# Patient Record
Sex: Male | Born: 1945
Health system: Southern US, Community
[De-identification: ages and names within clinical notes are randomized; demographics above are authoritative.]

## PROBLEM LIST (undated history)

## (undated) DIAGNOSIS — R413 Other amnesia: Principal | ICD-10-CM

## (undated) DIAGNOSIS — I10 Essential (primary) hypertension: Secondary | ICD-10-CM

## (undated) DIAGNOSIS — F32A Depression, unspecified: Secondary | ICD-10-CM

## (undated) DIAGNOSIS — R269 Unspecified abnormalities of gait and mobility: Secondary | ICD-10-CM

## (undated) DIAGNOSIS — E785 Hyperlipidemia, unspecified: Secondary | ICD-10-CM

## (undated) DIAGNOSIS — S37009A Unspecified injury of unspecified kidney, initial encounter: Secondary | ICD-10-CM

## (undated) DIAGNOSIS — N289 Disorder of kidney and ureter, unspecified: Secondary | ICD-10-CM

## (undated) DIAGNOSIS — F419 Anxiety disorder, unspecified: Secondary | ICD-10-CM

## (undated) DIAGNOSIS — K219 Gastro-esophageal reflux disease without esophagitis: Secondary | ICD-10-CM

## (undated) DIAGNOSIS — F1010 Alcohol abuse, uncomplicated: Secondary | ICD-10-CM

## (undated) HISTORY — DX: Disorder of kidney and ureter, unspecified: N28.9

## (undated) HISTORY — DX: Unspecified injury of unspecified kidney, initial encounter: S37.009A

## (undated) HISTORY — PX: COLONOSCOPY: SHX174

## (undated) HISTORY — DX: Alcohol abuse, uncomplicated: F10.10

## (undated) HISTORY — DX: Unspecified abnormalities of gait and mobility: R26.9

## (undated) HISTORY — PX: X-STOP IMPLANTATION: SHX2677

## (undated) HISTORY — PX: BACK SURGERY: SHX140

## (undated) HISTORY — PX: EYE SURGERY: SHX253

## (undated) HISTORY — PX: COLONOSCOPY W/ POLYPECTOMY: SHX1380

## (undated) HISTORY — DX: Other amnesia: R41.3

---

## 2000-02-26 ENCOUNTER — Encounter: Payer: Self-pay | Admitting: *Deleted

## 2000-02-26 ENCOUNTER — Encounter: Admission: RE | Admit: 2000-02-26 | Discharge: 2000-02-26 | Payer: Self-pay | Admitting: *Deleted

## 2006-07-31 ENCOUNTER — Encounter: Admission: RE | Admit: 2006-07-31 | Discharge: 2006-07-31 | Payer: Self-pay | Admitting: Internal Medicine

## 2009-12-15 HISTORY — PX: KNEE SURGERY: SHX244

## 2010-03-20 ENCOUNTER — Ambulatory Visit (HOSPITAL_COMMUNITY): Admission: RE | Admit: 2010-03-20 | Discharge: 2010-03-22 | Payer: Self-pay | Admitting: Orthopedic Surgery

## 2011-02-17 ENCOUNTER — Ambulatory Visit: Payer: BLUE CROSS/BLUE SHIELD | Attending: Dermatology | Admitting: Dermatology

## 2011-02-17 DIAGNOSIS — L254 Unspecified contact dermatitis due to food in contact with skin: Secondary | ICD-10-CM | POA: Insufficient documentation

## 2011-03-03 ENCOUNTER — Ambulatory Visit: Payer: BLUE CROSS/BLUE SHIELD | Attending: Dermatology | Admitting: Dermatology

## 2011-03-03 DIAGNOSIS — L254 Unspecified contact dermatitis due to food in contact with skin: Secondary | ICD-10-CM | POA: Insufficient documentation

## 2011-03-05 LAB — CBC
HCT: 35.3 % — ABNORMAL LOW (ref 39.0–52.0)
Hemoglobin: 12.1 g/dL — ABNORMAL LOW (ref 13.0–17.0)
MCHC: 34.4 g/dL (ref 30.0–36.0)
MCV: 92.8 fL (ref 78.0–100.0)
Platelets: 286 10*3/uL (ref 150–400)
RBC: 3.81 MIL/uL — ABNORMAL LOW (ref 4.22–5.81)
RDW: 13.4 % (ref 11.5–15.5)
WBC: 7 10*3/uL (ref 4.0–10.5)

## 2011-03-05 LAB — COMPREHENSIVE METABOLIC PANEL
ALT: 27 U/L (ref 0–53)
AST: 36 U/L (ref 0–37)
Albumin: 4 g/dL (ref 3.5–5.2)
Alkaline Phosphatase: 37 U/L — ABNORMAL LOW (ref 39–117)
BUN: 29 mg/dL — ABNORMAL HIGH (ref 6–23)
CO2: 27 mEq/L (ref 19–32)
Calcium: 9.8 mg/dL (ref 8.4–10.5)
Chloride: 103 mEq/L (ref 96–112)
Creatinine, Ser: 1.43 mg/dL (ref 0.4–1.5)
GFR calc Af Amer: >60 mL/min (ref >60)
GFR calc non Af Amer: 50 mL/min — ABNORMAL LOW (ref >60)
Glucose, Bld: 96 mg/dL (ref 70–99)
Potassium: 3.8 mEq/L (ref 3.5–5.1)
Sodium: 139 mEq/L (ref 135–145)
Total Bilirubin: 0.8 mg/dL (ref 0.3–1.2)
Total Protein: 7.6 g/dL (ref 6.0–8.3)

## 2011-03-05 LAB — PROTIME-INR
INR: 1.27 (ref 0.00–1.49)
INR: 1.28 (ref 0.00–1.49)
Prothrombin Time: 15.9 seconds — ABNORMAL HIGH (ref 11.6–15.2)

## 2011-03-05 LAB — DIFFERENTIAL
Basophils Absolute: 0 10*3/uL (ref 0.0–0.1)
Basophils Relative: 1 % (ref 0–1)
Eosinophils Absolute: 0.5 10*3/uL (ref 0.0–0.7)
Eosinophils Relative: 8 % — ABNORMAL HIGH (ref 0–5)
Lymphocytes Relative: 27 % (ref 12–46)
Lymphs Abs: 1.9 10*3/uL (ref 0.7–4.0)
Monocytes Absolute: 0.5 10*3/uL (ref 0.1–1.0)
Monocytes Relative: 7 % (ref 3–12)
Neutro Abs: 4.1 10*3/uL (ref 1.7–7.7)
Neutrophils Relative %: 58 % (ref 43–77)

## 2011-03-05 LAB — URINALYSIS, ROUTINE W REFLEX MICROSCOPIC
Bilirubin Urine: NEGATIVE
Hgb urine dipstick: NEGATIVE
Specific Gravity, Urine: 1.013 (ref 1.005–1.030)
pH: 6 (ref 5.0–8.0)

## 2012-07-21 DIAGNOSIS — E785 Hyperlipidemia, unspecified: Secondary | ICD-10-CM | POA: Diagnosis not present

## 2012-07-21 DIAGNOSIS — I10 Essential (primary) hypertension: Secondary | ICD-10-CM | POA: Diagnosis not present

## 2012-07-28 DIAGNOSIS — N182 Chronic kidney disease, stage 2 (mild): Secondary | ICD-10-CM | POA: Diagnosis not present

## 2012-07-28 DIAGNOSIS — M545 Low back pain, unspecified: Secondary | ICD-10-CM | POA: Diagnosis not present

## 2012-07-28 DIAGNOSIS — E785 Hyperlipidemia, unspecified: Secondary | ICD-10-CM | POA: Diagnosis not present

## 2012-07-28 DIAGNOSIS — R7301 Impaired fasting glucose: Secondary | ICD-10-CM | POA: Diagnosis not present

## 2012-08-27 DIAGNOSIS — N182 Chronic kidney disease, stage 2 (mild): Secondary | ICD-10-CM | POA: Diagnosis not present

## 2012-08-27 DIAGNOSIS — M79609 Pain in unspecified limb: Secondary | ICD-10-CM | POA: Diagnosis not present

## 2012-08-27 DIAGNOSIS — I10 Essential (primary) hypertension: Secondary | ICD-10-CM | POA: Diagnosis not present

## 2012-09-17 DIAGNOSIS — Z23 Encounter for immunization: Secondary | ICD-10-CM | POA: Diagnosis not present

## 2012-09-24 DIAGNOSIS — M545 Low back pain, unspecified: Secondary | ICD-10-CM | POA: Diagnosis not present

## 2012-10-13 DIAGNOSIS — IMO0002 Reserved for concepts with insufficient information to code with codable children: Secondary | ICD-10-CM | POA: Diagnosis not present

## 2012-10-21 DIAGNOSIS — IMO0002 Reserved for concepts with insufficient information to code with codable children: Secondary | ICD-10-CM | POA: Diagnosis not present

## 2012-10-28 DIAGNOSIS — M48061 Spinal stenosis, lumbar region without neurogenic claudication: Secondary | ICD-10-CM | POA: Diagnosis not present

## 2012-10-29 ENCOUNTER — Other Ambulatory Visit: Payer: Self-pay | Admitting: Orthopedic Surgery

## 2012-10-29 DIAGNOSIS — M48061 Spinal stenosis, lumbar region without neurogenic claudication: Secondary | ICD-10-CM

## 2012-11-01 ENCOUNTER — Other Ambulatory Visit: Payer: Self-pay | Admitting: Orthopedic Surgery

## 2012-11-01 ENCOUNTER — Ambulatory Visit
Admission: RE | Admit: 2012-11-01 | Discharge: 2012-11-01 | Disposition: A | Discharge status: Home / Self Care | Payer: Medicare Other | Source: Ambulatory Visit | Attending: Orthopedic Surgery | Admitting: Orthopedic Surgery

## 2012-11-01 ENCOUNTER — Other Ambulatory Visit: Payer: Self-pay

## 2012-11-01 DIAGNOSIS — M48061 Spinal stenosis, lumbar region without neurogenic claudication: Secondary | ICD-10-CM

## 2012-11-15 ENCOUNTER — Other Ambulatory Visit: Payer: Medicare Other

## 2013-02-02 DIAGNOSIS — I10 Essential (primary) hypertension: Secondary | ICD-10-CM | POA: Diagnosis not present

## 2013-02-02 DIAGNOSIS — Z125 Encounter for screening for malignant neoplasm of prostate: Secondary | ICD-10-CM | POA: Diagnosis not present

## 2013-02-02 DIAGNOSIS — R7301 Impaired fasting glucose: Secondary | ICD-10-CM | POA: Diagnosis not present

## 2013-02-02 DIAGNOSIS — E785 Hyperlipidemia, unspecified: Secondary | ICD-10-CM | POA: Diagnosis not present

## 2013-02-09 DIAGNOSIS — I10 Essential (primary) hypertension: Secondary | ICD-10-CM | POA: Diagnosis not present

## 2013-02-09 DIAGNOSIS — Z0000 Encounter for general adult medical examination without abnormal findings: Secondary | ICD-10-CM | POA: Diagnosis not present

## 2013-02-09 DIAGNOSIS — Z125 Encounter for screening for malignant neoplasm of prostate: Secondary | ICD-10-CM | POA: Diagnosis not present

## 2013-02-09 DIAGNOSIS — Z1331 Encounter for screening for depression: Secondary | ICD-10-CM | POA: Diagnosis not present

## 2013-02-11 DIAGNOSIS — Z1212 Encounter for screening for malignant neoplasm of rectum: Secondary | ICD-10-CM | POA: Diagnosis not present

## 2013-08-17 DIAGNOSIS — E785 Hyperlipidemia, unspecified: Secondary | ICD-10-CM | POA: Diagnosis not present

## 2013-08-26 DIAGNOSIS — Z23 Encounter for immunization: Secondary | ICD-10-CM | POA: Diagnosis not present

## 2013-08-26 DIAGNOSIS — R7301 Impaired fasting glucose: Secondary | ICD-10-CM | POA: Diagnosis not present

## 2013-08-26 DIAGNOSIS — I10 Essential (primary) hypertension: Secondary | ICD-10-CM | POA: Diagnosis not present

## 2013-08-26 DIAGNOSIS — Z6828 Body mass index (BMI) 28.0-28.9, adult: Secondary | ICD-10-CM | POA: Diagnosis not present

## 2013-08-26 DIAGNOSIS — N183 Chronic kidney disease, stage 3 unspecified: Secondary | ICD-10-CM | POA: Diagnosis not present

## 2013-08-26 DIAGNOSIS — E785 Hyperlipidemia, unspecified: Secondary | ICD-10-CM | POA: Diagnosis not present

## 2013-09-26 DIAGNOSIS — N183 Chronic kidney disease, stage 3 unspecified: Secondary | ICD-10-CM | POA: Diagnosis not present

## 2013-12-23 DIAGNOSIS — H40219 Acute angle-closure glaucoma, unspecified eye: Secondary | ICD-10-CM | POA: Diagnosis not present

## 2013-12-23 DIAGNOSIS — H2000 Unspecified acute and subacute iridocyclitis: Secondary | ICD-10-CM | POA: Diagnosis not present

## 2013-12-24 DIAGNOSIS — H2000 Unspecified acute and subacute iridocyclitis: Secondary | ICD-10-CM | POA: Diagnosis not present

## 2013-12-26 DIAGNOSIS — H2000 Unspecified acute and subacute iridocyclitis: Secondary | ICD-10-CM | POA: Diagnosis not present

## 2014-01-02 DIAGNOSIS — H2000 Unspecified acute and subacute iridocyclitis: Secondary | ICD-10-CM | POA: Diagnosis not present

## 2014-01-03 DIAGNOSIS — H2000 Unspecified acute and subacute iridocyclitis: Secondary | ICD-10-CM | POA: Diagnosis not present

## 2014-01-04 DIAGNOSIS — H2000 Unspecified acute and subacute iridocyclitis: Secondary | ICD-10-CM | POA: Diagnosis not present

## 2014-01-11 DIAGNOSIS — H2000 Unspecified acute and subacute iridocyclitis: Secondary | ICD-10-CM | POA: Diagnosis not present

## 2014-01-16 DIAGNOSIS — H2000 Unspecified acute and subacute iridocyclitis: Secondary | ICD-10-CM | POA: Diagnosis not present

## 2014-01-23 DIAGNOSIS — H2000 Unspecified acute and subacute iridocyclitis: Secondary | ICD-10-CM | POA: Diagnosis not present

## 2014-02-03 DIAGNOSIS — H4050X0 Glaucoma secondary to other eye disorders, unspecified eye, stage unspecified: Secondary | ICD-10-CM | POA: Diagnosis not present

## 2014-02-03 DIAGNOSIS — H409 Unspecified glaucoma: Secondary | ICD-10-CM | POA: Diagnosis not present

## 2014-02-03 DIAGNOSIS — H40839 Aqueous misdirection, unspecified eye: Secondary | ICD-10-CM | POA: Diagnosis not present

## 2014-02-03 DIAGNOSIS — H209 Unspecified iridocyclitis: Secondary | ICD-10-CM | POA: Diagnosis not present

## 2014-02-08 DIAGNOSIS — H2100 Hyphema, unspecified eye: Secondary | ICD-10-CM | POA: Diagnosis not present

## 2014-02-08 DIAGNOSIS — T8579XA Infection and inflammatory reaction due to other internal prosthetic devices, implants and grafts, initial encounter: Secondary | ICD-10-CM | POA: Diagnosis not present

## 2014-02-08 DIAGNOSIS — H4050X0 Glaucoma secondary to other eye disorders, unspecified eye, stage unspecified: Secondary | ICD-10-CM | POA: Diagnosis not present

## 2014-02-08 DIAGNOSIS — H409 Unspecified glaucoma: Secondary | ICD-10-CM | POA: Diagnosis not present

## 2014-02-10 DIAGNOSIS — H2000 Unspecified acute and subacute iridocyclitis: Secondary | ICD-10-CM | POA: Diagnosis not present

## 2014-02-13 DIAGNOSIS — H4050X0 Glaucoma secondary to other eye disorders, unspecified eye, stage unspecified: Secondary | ICD-10-CM | POA: Diagnosis not present

## 2014-02-20 DIAGNOSIS — Z5189 Encounter for other specified aftercare: Secondary | ICD-10-CM | POA: Diagnosis not present

## 2014-02-25 DIAGNOSIS — H4050X0 Glaucoma secondary to other eye disorders, unspecified eye, stage unspecified: Secondary | ICD-10-CM | POA: Diagnosis not present

## 2014-02-27 DIAGNOSIS — H4050X0 Glaucoma secondary to other eye disorders, unspecified eye, stage unspecified: Secondary | ICD-10-CM | POA: Diagnosis not present

## 2014-03-03 DIAGNOSIS — H4050X0 Glaucoma secondary to other eye disorders, unspecified eye, stage unspecified: Secondary | ICD-10-CM | POA: Diagnosis not present

## 2014-03-10 DIAGNOSIS — H4050X0 Glaucoma secondary to other eye disorders, unspecified eye, stage unspecified: Secondary | ICD-10-CM | POA: Diagnosis not present

## 2014-03-29 DIAGNOSIS — Z125 Encounter for screening for malignant neoplasm of prostate: Secondary | ICD-10-CM | POA: Diagnosis not present

## 2014-03-29 DIAGNOSIS — E785 Hyperlipidemia, unspecified: Secondary | ICD-10-CM | POA: Diagnosis not present

## 2014-03-29 DIAGNOSIS — I10 Essential (primary) hypertension: Secondary | ICD-10-CM | POA: Diagnosis not present

## 2014-03-29 DIAGNOSIS — R7301 Impaired fasting glucose: Secondary | ICD-10-CM | POA: Diagnosis not present

## 2014-04-05 DIAGNOSIS — E785 Hyperlipidemia, unspecified: Secondary | ICD-10-CM | POA: Diagnosis not present

## 2014-04-05 DIAGNOSIS — Z1331 Encounter for screening for depression: Secondary | ICD-10-CM | POA: Diagnosis not present

## 2014-04-05 DIAGNOSIS — Z125 Encounter for screening for malignant neoplasm of prostate: Secondary | ICD-10-CM | POA: Diagnosis not present

## 2014-04-05 DIAGNOSIS — Z0000 Encounter for general adult medical examination without abnormal findings: Secondary | ICD-10-CM | POA: Diagnosis not present

## 2014-04-05 DIAGNOSIS — M545 Low back pain, unspecified: Secondary | ICD-10-CM | POA: Diagnosis not present

## 2014-04-05 DIAGNOSIS — K219 Gastro-esophageal reflux disease without esophagitis: Secondary | ICD-10-CM | POA: Diagnosis not present

## 2014-04-05 DIAGNOSIS — R7301 Impaired fasting glucose: Secondary | ICD-10-CM | POA: Diagnosis not present

## 2014-04-05 DIAGNOSIS — N183 Chronic kidney disease, stage 3 unspecified: Secondary | ICD-10-CM | POA: Diagnosis not present

## 2014-04-05 DIAGNOSIS — H2000 Unspecified acute and subacute iridocyclitis: Secondary | ICD-10-CM | POA: Diagnosis not present

## 2014-04-05 DIAGNOSIS — I10 Essential (primary) hypertension: Secondary | ICD-10-CM | POA: Diagnosis not present

## 2014-04-07 DIAGNOSIS — Z1212 Encounter for screening for malignant neoplasm of rectum: Secondary | ICD-10-CM | POA: Diagnosis not present

## 2014-04-12 DIAGNOSIS — H4050X0 Glaucoma secondary to other eye disorders, unspecified eye, stage unspecified: Secondary | ICD-10-CM | POA: Diagnosis not present

## 2014-06-19 DIAGNOSIS — H01119 Allergic dermatitis of unspecified eye, unspecified eyelid: Secondary | ICD-10-CM | POA: Diagnosis not present

## 2014-07-05 DIAGNOSIS — H4050X0 Glaucoma secondary to other eye disorders, unspecified eye, stage unspecified: Secondary | ICD-10-CM | POA: Diagnosis not present

## 2014-09-25 DIAGNOSIS — Z23 Encounter for immunization: Secondary | ICD-10-CM | POA: Diagnosis not present

## 2014-09-25 DIAGNOSIS — I10 Essential (primary) hypertension: Secondary | ICD-10-CM | POA: Diagnosis not present

## 2014-09-25 DIAGNOSIS — E785 Hyperlipidemia, unspecified: Secondary | ICD-10-CM | POA: Diagnosis not present

## 2014-09-25 DIAGNOSIS — R7301 Impaired fasting glucose: Secondary | ICD-10-CM | POA: Diagnosis not present

## 2014-10-04 DIAGNOSIS — E785 Hyperlipidemia, unspecified: Secondary | ICD-10-CM | POA: Diagnosis not present

## 2014-10-04 DIAGNOSIS — R7301 Impaired fasting glucose: Secondary | ICD-10-CM | POA: Diagnosis not present

## 2014-10-04 DIAGNOSIS — I10 Essential (primary) hypertension: Secondary | ICD-10-CM | POA: Diagnosis not present

## 2014-10-04 DIAGNOSIS — K589 Irritable bowel syndrome without diarrhea: Secondary | ICD-10-CM | POA: Diagnosis not present

## 2014-10-04 DIAGNOSIS — K219 Gastro-esophageal reflux disease without esophagitis: Secondary | ICD-10-CM | POA: Diagnosis not present

## 2014-10-04 DIAGNOSIS — M545 Low back pain: Secondary | ICD-10-CM | POA: Diagnosis not present

## 2014-10-04 DIAGNOSIS — N183 Chronic kidney disease, stage 3 (moderate): Secondary | ICD-10-CM | POA: Diagnosis not present

## 2014-10-04 DIAGNOSIS — G479 Sleep disorder, unspecified: Secondary | ICD-10-CM | POA: Diagnosis not present

## 2014-10-18 DIAGNOSIS — L239 Allergic contact dermatitis, unspecified cause: Secondary | ICD-10-CM | POA: Diagnosis not present

## 2015-02-28 DIAGNOSIS — H209 Unspecified iridocyclitis: Secondary | ICD-10-CM | POA: Diagnosis not present

## 2015-02-28 DIAGNOSIS — Z961 Presence of intraocular lens: Secondary | ICD-10-CM | POA: Diagnosis not present

## 2015-02-28 DIAGNOSIS — L309 Dermatitis, unspecified: Secondary | ICD-10-CM | POA: Diagnosis not present

## 2015-03-19 DIAGNOSIS — T8579XD Infection and inflammatory reaction due to other internal prosthetic devices, implants and grafts, subsequent encounter: Secondary | ICD-10-CM | POA: Diagnosis not present

## 2015-03-19 DIAGNOSIS — H2102 Hyphema, left eye: Secondary | ICD-10-CM | POA: Diagnosis not present

## 2015-04-02 DIAGNOSIS — R7301 Impaired fasting glucose: Secondary | ICD-10-CM | POA: Diagnosis not present

## 2015-04-02 DIAGNOSIS — E785 Hyperlipidemia, unspecified: Secondary | ICD-10-CM | POA: Diagnosis not present

## 2015-04-02 DIAGNOSIS — Z008 Encounter for other general examination: Secondary | ICD-10-CM | POA: Diagnosis not present

## 2015-04-02 DIAGNOSIS — H5310 Unspecified subjective visual disturbances: Secondary | ICD-10-CM | POA: Diagnosis not present

## 2015-04-02 DIAGNOSIS — I10 Essential (primary) hypertension: Secondary | ICD-10-CM | POA: Diagnosis not present

## 2015-04-02 DIAGNOSIS — Z125 Encounter for screening for malignant neoplasm of prostate: Secondary | ICD-10-CM | POA: Diagnosis not present

## 2015-04-10 DIAGNOSIS — H33011 Retinal detachment with single break, right eye: Secondary | ICD-10-CM | POA: Diagnosis not present

## 2015-04-11 ENCOUNTER — Ambulatory Visit
Admit: 2015-04-11 | Disposition: A | Discharge status: Home / Self Care | Payer: Self-pay | Attending: Ophthalmology | Admitting: Ophthalmology

## 2015-04-11 DIAGNOSIS — H33001 Unspecified retinal detachment with retinal break, right eye: Secondary | ICD-10-CM | POA: Diagnosis not present

## 2015-04-11 DIAGNOSIS — Z79899 Other long term (current) drug therapy: Secondary | ICD-10-CM | POA: Diagnosis not present

## 2015-04-11 DIAGNOSIS — H33011 Retinal detachment with single break, right eye: Secondary | ICD-10-CM | POA: Diagnosis not present

## 2015-04-15 NOTE — Op Note (Addendum)
PATIENT NAME:  Brandon Fleming, Brandon Fleming MR#:  [id number] DATE OF BIRTH:  02-14-1946  DATE OF PROCEDURE:  04/11/2015.  PREPROCEDURE DIAGNOSIS:  Macula-off retinal detachment, right eye.   POSTPROCEDURE DIAGNOSIS:  Macula-off retinal detachment, right eye.   PROCEDURE:  A 25-gauge pars plana vitrectomy with endolaser drain retinotomy, air-fluid exchange, and 24% SF6.   ANESTHESIA:  MAC with retrobulbar block.   COMPLICATIONS:  None.   BLOOD LOSS:  Minimal.   PROCEDURE NOTE:  The patient was evaluated for a macula-off retinal detachment in his right eye.  Risks, benefits, alternatives, and complications were discussed with patient, and patient elected to proceed with pars plana vitrectomy with retinal detachment repair.  On the day of surgery, the patient was greeted in the preoperative holding area.  The right eye was marked and any questions were answered.  The consent was reviewed.  The patient was taken to the operating room in supine position.  Monitored anesthesia care was administered, and 4 mL of a retrobulbar block consisting of Marcaine plain, lidocaine plain, and Wydase was injected.  The right eye was then prepped and draped in the usual sterile fashion.  Three 25-gauge trocars were placed in the usual positions. The infusion cannula was checked to make sure it was in the vitreous cavity before starting the infusion.  A core vitrectomy was performed.  The patient did have a posterior vitreous detachment and so the core drill was removed.  The peripheral drill was moved 360 degrees to the periphery with careful attention to the areas in the detachment temporally from 12 to 7:30.  Careful attention was made to remove the vitreous from over the break.  A 360-degree scleral depression was performed.  No additional tears were found but 2 additional suspicious areas were noted.  The vitreous was shaved closely over the detached retina using scleral depression.  Endo Cautery was used to mark the causative  break as well as the suspicious lesions and to make a drain retinotomy site in the meridian of the original tear.  The soft tip was used to then fluid-fluid exchange and drain the thick subretinal fluid from underneath the retina.  An air-fluid exchange was then performed with draining of the fluid in the eye as well as the remainder of the subretinal fluid.  Once the retina was flat, a laser was used to laser around the drain retinotomy, around the causative break, and then around the suspicious areas, and a light PRP pattern was applied in the area of the retinal detachment.  At this point, 1  trocar was removed, 4 times the vitreous volume of 24% SF6 was infused into the eye, and the remaining trocars were removed.  The pressure was acceptable by palpation.  Subconjunctival cefuroxime and dexamethasone was injected.  The patient was patched and shielded with Neo-Poly-Dex ointment and taken to the recovery area in stable condition.    ____________________________ Laban Emperor Oval Linsey, MD jdr:kc D: 04/11/2015 11:42:00 ET T: 04/11/2015 16:42:25 ET JOB#: [id number]  cc: Janett Billow D. Oval Linsey, MD, <Dictator> Alexia Freestone MD ELECTRONICALLY SIGNED 04/24/2015 9:01

## 2015-04-18 DIAGNOSIS — H2000 Unspecified acute and subacute iridocyclitis: Secondary | ICD-10-CM | POA: Diagnosis not present

## 2015-04-18 DIAGNOSIS — H4042X3 Glaucoma secondary to eye inflammation, left eye, severe stage: Secondary | ICD-10-CM | POA: Diagnosis not present

## 2015-04-19 DIAGNOSIS — H20022 Recurrent acute iridocyclitis, left eye: Secondary | ICD-10-CM | POA: Diagnosis not present

## 2015-04-19 DIAGNOSIS — H40052 Ocular hypertension, left eye: Secondary | ICD-10-CM | POA: Diagnosis not present

## 2015-05-01 DIAGNOSIS — H33011 Retinal detachment with single break, right eye: Secondary | ICD-10-CM | POA: Diagnosis not present

## 2015-05-01 DIAGNOSIS — L233 Allergic contact dermatitis due to drugs in contact with skin: Secondary | ICD-10-CM | POA: Diagnosis not present

## 2015-05-01 DIAGNOSIS — L259 Unspecified contact dermatitis, unspecified cause: Secondary | ICD-10-CM | POA: Diagnosis not present

## 2015-05-01 DIAGNOSIS — T8579XA Infection and inflammatory reaction due to other internal prosthetic devices, implants and grafts, initial encounter: Secondary | ICD-10-CM | POA: Diagnosis not present

## 2015-05-01 DIAGNOSIS — H539 Unspecified visual disturbance: Secondary | ICD-10-CM | POA: Diagnosis not present

## 2015-05-01 DIAGNOSIS — H40053 Ocular hypertension, bilateral: Secondary | ICD-10-CM | POA: Diagnosis not present

## 2015-05-01 DIAGNOSIS — H33001 Unspecified retinal detachment with retinal break, right eye: Secondary | ICD-10-CM | POA: Diagnosis not present

## 2015-05-01 DIAGNOSIS — Z961 Presence of intraocular lens: Secondary | ICD-10-CM | POA: Diagnosis not present

## 2015-07-16 DIAGNOSIS — Z6825 Body mass index (BMI) 25.0-25.9, adult: Secondary | ICD-10-CM | POA: Diagnosis not present

## 2015-07-16 DIAGNOSIS — I10 Essential (primary) hypertension: Secondary | ICD-10-CM | POA: Diagnosis not present

## 2015-07-16 DIAGNOSIS — K219 Gastro-esophageal reflux disease without esophagitis: Secondary | ICD-10-CM | POA: Diagnosis not present

## 2015-07-16 DIAGNOSIS — Z1389 Encounter for screening for other disorder: Secondary | ICD-10-CM | POA: Diagnosis not present

## 2015-07-16 DIAGNOSIS — M545 Low back pain: Secondary | ICD-10-CM | POA: Diagnosis not present

## 2015-07-16 DIAGNOSIS — Z0000 Encounter for general adult medical examination without abnormal findings: Secondary | ICD-10-CM | POA: Diagnosis not present

## 2015-07-16 DIAGNOSIS — E785 Hyperlipidemia, unspecified: Secondary | ICD-10-CM | POA: Diagnosis not present

## 2015-07-16 DIAGNOSIS — H3320 Serous retinal detachment, unspecified eye: Secondary | ICD-10-CM | POA: Diagnosis not present

## 2015-07-16 DIAGNOSIS — M66259 Spontaneous rupture of extensor tendons, unspecified thigh: Secondary | ICD-10-CM | POA: Diagnosis not present

## 2015-07-16 DIAGNOSIS — N183 Chronic kidney disease, stage 3 (moderate): Secondary | ICD-10-CM | POA: Diagnosis not present

## 2015-07-16 DIAGNOSIS — Z23 Encounter for immunization: Secondary | ICD-10-CM | POA: Diagnosis not present

## 2015-07-16 DIAGNOSIS — R7301 Impaired fasting glucose: Secondary | ICD-10-CM | POA: Diagnosis not present

## 2015-07-16 DIAGNOSIS — K589 Irritable bowel syndrome without diarrhea: Secondary | ICD-10-CM | POA: Diagnosis not present

## 2015-08-22 DIAGNOSIS — Z961 Presence of intraocular lens: Secondary | ICD-10-CM | POA: Diagnosis not present

## 2015-08-22 DIAGNOSIS — H21232 Degeneration of iris (pigmentary), left eye: Secondary | ICD-10-CM | POA: Diagnosis not present

## 2015-08-22 DIAGNOSIS — H5213 Myopia, bilateral: Secondary | ICD-10-CM | POA: Diagnosis not present

## 2015-09-01 DIAGNOSIS — Z23 Encounter for immunization: Secondary | ICD-10-CM | POA: Diagnosis not present

## 2015-10-30 DIAGNOSIS — H33011 Retinal detachment with single break, right eye: Secondary | ICD-10-CM | POA: Diagnosis not present

## 2016-01-17 DIAGNOSIS — N183 Chronic kidney disease, stage 3 (moderate): Secondary | ICD-10-CM | POA: Diagnosis not present

## 2016-01-17 DIAGNOSIS — I10 Essential (primary) hypertension: Secondary | ICD-10-CM | POA: Diagnosis not present

## 2016-01-17 DIAGNOSIS — N390 Urinary tract infection, site not specified: Secondary | ICD-10-CM | POA: Diagnosis not present

## 2016-01-17 DIAGNOSIS — N529 Male erectile dysfunction, unspecified: Secondary | ICD-10-CM | POA: Diagnosis not present

## 2016-01-17 DIAGNOSIS — R8299 Other abnormal findings in urine: Secondary | ICD-10-CM | POA: Diagnosis not present

## 2016-01-17 DIAGNOSIS — K219 Gastro-esophageal reflux disease without esophagitis: Secondary | ICD-10-CM | POA: Diagnosis not present

## 2016-01-17 DIAGNOSIS — Z1389 Encounter for screening for other disorder: Secondary | ICD-10-CM | POA: Diagnosis not present

## 2016-01-17 DIAGNOSIS — K5909 Other constipation: Secondary | ICD-10-CM | POA: Diagnosis not present

## 2016-01-17 DIAGNOSIS — R7301 Impaired fasting glucose: Secondary | ICD-10-CM | POA: Diagnosis not present

## 2016-01-17 DIAGNOSIS — N3281 Overactive bladder: Secondary | ICD-10-CM | POA: Diagnosis not present

## 2016-01-17 DIAGNOSIS — Z6825 Body mass index (BMI) 25.0-25.9, adult: Secondary | ICD-10-CM | POA: Diagnosis not present

## 2016-02-07 DIAGNOSIS — L812 Freckles: Secondary | ICD-10-CM | POA: Diagnosis not present

## 2016-02-07 DIAGNOSIS — L300 Nummular dermatitis: Secondary | ICD-10-CM | POA: Diagnosis not present

## 2016-02-07 DIAGNOSIS — L821 Other seborrheic keratosis: Secondary | ICD-10-CM | POA: Diagnosis not present

## 2016-04-14 DIAGNOSIS — Z6826 Body mass index (BMI) 26.0-26.9, adult: Secondary | ICD-10-CM | POA: Diagnosis not present

## 2016-04-14 DIAGNOSIS — M542 Cervicalgia: Secondary | ICD-10-CM | POA: Diagnosis not present

## 2016-04-14 DIAGNOSIS — M4802 Spinal stenosis, cervical region: Secondary | ICD-10-CM | POA: Diagnosis not present

## 2016-04-29 DIAGNOSIS — H33011 Retinal detachment with single break, right eye: Secondary | ICD-10-CM | POA: Diagnosis not present

## 2016-07-10 DIAGNOSIS — E1140 Type 2 diabetes mellitus with diabetic neuropathy, unspecified: Secondary | ICD-10-CM | POA: Diagnosis present

## 2016-07-22 DIAGNOSIS — E784 Other hyperlipidemia: Secondary | ICD-10-CM | POA: Diagnosis not present

## 2016-07-22 DIAGNOSIS — I10 Essential (primary) hypertension: Secondary | ICD-10-CM | POA: Diagnosis not present

## 2016-07-22 DIAGNOSIS — Z125 Encounter for screening for malignant neoplasm of prostate: Secondary | ICD-10-CM | POA: Diagnosis not present

## 2016-07-22 DIAGNOSIS — R7301 Impaired fasting glucose: Secondary | ICD-10-CM | POA: Diagnosis not present

## 2016-07-29 DIAGNOSIS — H3320 Serous retinal detachment, unspecified eye: Secondary | ICD-10-CM | POA: Diagnosis not present

## 2016-07-29 DIAGNOSIS — K589 Irritable bowel syndrome without diarrhea: Secondary | ICD-10-CM | POA: Diagnosis not present

## 2016-07-29 DIAGNOSIS — N183 Chronic kidney disease, stage 3 (moderate): Secondary | ICD-10-CM | POA: Diagnosis not present

## 2016-07-29 DIAGNOSIS — R945 Abnormal results of liver function studies: Secondary | ICD-10-CM | POA: Diagnosis not present

## 2016-07-29 DIAGNOSIS — I10 Essential (primary) hypertension: Secondary | ICD-10-CM | POA: Diagnosis not present

## 2016-07-29 DIAGNOSIS — E784 Other hyperlipidemia: Secondary | ICD-10-CM | POA: Diagnosis not present

## 2016-07-29 DIAGNOSIS — Z23 Encounter for immunization: Secondary | ICD-10-CM | POA: Diagnosis not present

## 2016-07-29 DIAGNOSIS — Z0000 Encounter for general adult medical examination without abnormal findings: Secondary | ICD-10-CM | POA: Diagnosis not present

## 2016-07-29 DIAGNOSIS — K219 Gastro-esophageal reflux disease without esophagitis: Secondary | ICD-10-CM | POA: Diagnosis not present

## 2016-07-29 DIAGNOSIS — Z1389 Encounter for screening for other disorder: Secondary | ICD-10-CM | POA: Diagnosis not present

## 2016-07-29 DIAGNOSIS — R7301 Impaired fasting glucose: Secondary | ICD-10-CM | POA: Diagnosis not present

## 2016-07-29 DIAGNOSIS — M542 Cervicalgia: Secondary | ICD-10-CM | POA: Diagnosis not present

## 2016-07-30 DIAGNOSIS — Z1212 Encounter for screening for malignant neoplasm of rectum: Secondary | ICD-10-CM | POA: Diagnosis not present

## 2016-09-13 DIAGNOSIS — Z23 Encounter for immunization: Secondary | ICD-10-CM | POA: Diagnosis not present

## 2016-09-24 DIAGNOSIS — H20022 Recurrent acute iridocyclitis, left eye: Secondary | ICD-10-CM | POA: Diagnosis not present

## 2016-09-24 DIAGNOSIS — H5213 Myopia, bilateral: Secondary | ICD-10-CM | POA: Diagnosis not present

## 2016-09-24 DIAGNOSIS — H33011 Retinal detachment with single break, right eye: Secondary | ICD-10-CM | POA: Diagnosis not present

## 2016-09-24 DIAGNOSIS — Z961 Presence of intraocular lens: Secondary | ICD-10-CM | POA: Diagnosis not present

## 2017-01-28 DIAGNOSIS — E784 Other hyperlipidemia: Secondary | ICD-10-CM | POA: Diagnosis not present

## 2017-01-28 DIAGNOSIS — R7301 Impaired fasting glucose: Secondary | ICD-10-CM | POA: Diagnosis not present

## 2017-02-04 DIAGNOSIS — R945 Abnormal results of liver function studies: Secondary | ICD-10-CM | POA: Diagnosis not present

## 2017-02-04 DIAGNOSIS — Z6826 Body mass index (BMI) 26.0-26.9, adult: Secondary | ICD-10-CM | POA: Diagnosis not present

## 2017-02-04 DIAGNOSIS — K589 Irritable bowel syndrome without diarrhea: Secondary | ICD-10-CM | POA: Diagnosis not present

## 2017-02-04 DIAGNOSIS — N183 Chronic kidney disease, stage 3 (moderate): Secondary | ICD-10-CM | POA: Diagnosis not present

## 2017-02-04 DIAGNOSIS — R7301 Impaired fasting glucose: Secondary | ICD-10-CM | POA: Diagnosis not present

## 2017-02-04 DIAGNOSIS — I10 Essential (primary) hypertension: Secondary | ICD-10-CM | POA: Diagnosis not present

## 2017-02-04 DIAGNOSIS — E784 Other hyperlipidemia: Secondary | ICD-10-CM | POA: Diagnosis not present

## 2017-03-29 DIAGNOSIS — H20022 Recurrent acute iridocyclitis, left eye: Secondary | ICD-10-CM | POA: Diagnosis not present

## 2017-04-02 DIAGNOSIS — T887XXA Unspecified adverse effect of drug or medicament, initial encounter: Secondary | ICD-10-CM | POA: Diagnosis not present

## 2017-04-06 DIAGNOSIS — H20022 Recurrent acute iridocyclitis, left eye: Secondary | ICD-10-CM | POA: Diagnosis not present

## 2017-08-26 DIAGNOSIS — R7301 Impaired fasting glucose: Secondary | ICD-10-CM | POA: Diagnosis not present

## 2017-08-26 DIAGNOSIS — Z125 Encounter for screening for malignant neoplasm of prostate: Secondary | ICD-10-CM | POA: Diagnosis not present

## 2017-08-26 DIAGNOSIS — I10 Essential (primary) hypertension: Secondary | ICD-10-CM | POA: Diagnosis not present

## 2017-08-26 DIAGNOSIS — E784 Other hyperlipidemia: Secondary | ICD-10-CM | POA: Diagnosis not present

## 2017-09-02 DIAGNOSIS — Z9181 History of falling: Secondary | ICD-10-CM | POA: Diagnosis not present

## 2017-09-02 DIAGNOSIS — N183 Chronic kidney disease, stage 3 (moderate): Secondary | ICD-10-CM | POA: Diagnosis not present

## 2017-09-02 DIAGNOSIS — N528 Other male erectile dysfunction: Secondary | ICD-10-CM | POA: Diagnosis not present

## 2017-09-02 DIAGNOSIS — Z6826 Body mass index (BMI) 26.0-26.9, adult: Secondary | ICD-10-CM | POA: Diagnosis not present

## 2017-09-02 DIAGNOSIS — Z1389 Encounter for screening for other disorder: Secondary | ICD-10-CM | POA: Diagnosis not present

## 2017-09-02 DIAGNOSIS — E784 Other hyperlipidemia: Secondary | ICD-10-CM | POA: Diagnosis not present

## 2017-09-02 DIAGNOSIS — N3281 Overactive bladder: Secondary | ICD-10-CM | POA: Diagnosis not present

## 2017-09-02 DIAGNOSIS — Z23 Encounter for immunization: Secondary | ICD-10-CM | POA: Diagnosis not present

## 2017-09-02 DIAGNOSIS — H3320 Serous retinal detachment, unspecified eye: Secondary | ICD-10-CM | POA: Diagnosis not present

## 2017-09-02 DIAGNOSIS — R7301 Impaired fasting glucose: Secondary | ICD-10-CM | POA: Diagnosis not present

## 2017-09-02 DIAGNOSIS — Z0000 Encounter for general adult medical examination without abnormal findings: Secondary | ICD-10-CM | POA: Diagnosis not present

## 2017-09-04 DIAGNOSIS — Z1212 Encounter for screening for malignant neoplasm of rectum: Secondary | ICD-10-CM | POA: Diagnosis not present

## 2017-10-07 DIAGNOSIS — H5213 Myopia, bilateral: Secondary | ICD-10-CM | POA: Diagnosis not present

## 2017-10-07 DIAGNOSIS — Z961 Presence of intraocular lens: Secondary | ICD-10-CM | POA: Diagnosis not present

## 2017-10-07 DIAGNOSIS — H20022 Recurrent acute iridocyclitis, left eye: Secondary | ICD-10-CM | POA: Diagnosis not present

## 2017-12-04 ENCOUNTER — Ambulatory Visit (INDEPENDENT_AMBULATORY_CARE_PROVIDER_SITE_OTHER): Payer: Medicare Other

## 2017-12-04 ENCOUNTER — Ambulatory Visit (INDEPENDENT_AMBULATORY_CARE_PROVIDER_SITE_OTHER): Payer: Medicare Other | Admitting: Orthopaedic Surgery

## 2017-12-04 ENCOUNTER — Encounter (INDEPENDENT_AMBULATORY_CARE_PROVIDER_SITE_OTHER): Payer: Self-pay | Admitting: Orthopaedic Surgery

## 2017-12-04 DIAGNOSIS — M25561 Pain in right knee: Secondary | ICD-10-CM | POA: Diagnosis not present

## 2017-12-04 DIAGNOSIS — M25512 Pain in left shoulder: Secondary | ICD-10-CM | POA: Diagnosis not present

## 2017-12-04 DIAGNOSIS — G8929 Other chronic pain: Secondary | ICD-10-CM | POA: Diagnosis not present

## 2017-12-04 NOTE — Progress Notes (Addendum)
Office Visit Note   Patient: Brandon Fleming           Date of Birth: [birth year]           MRN: [id number] Visit Date: 12/04/2017              Requested by: No referring provider defined for this encounter. PCP: Brandon Solian, MD   Assessment & Plan: Visit Diagnoses:  1. Chronic left shoulder pain   2. Chronic pain of right knee     Plan: Right knee pain appears to be related to the mild arthritis. He does experience pain in the popliteal area without obvious effusion. No injury or trauma. I didn't feel any masses posteriorly has good pulses or think that a lot of the pain is simply referred from the knee. We talked about different treatment options including Advil and Aleve. Did not want to consider cortisone injection.  Left shoulder pain is either a small rotator cuff tear or mild rotator cuff tendinitis of also talked about different treatment options including therapy exercises even cortisone injection. He again he like to just consider the knee Advil up to see her back as needed. As always a possibility of considering an MRI scan showed this be a persistent problem. I did see an area of calcification within the proximal left humeral shaft that is either an enchondroma or bone infarct does not appear to be symptomatic Follow-Up Instructions: Return if symptoms worsen or fail to improve.   Orders:  Orders Placed This Encounter  Procedures  . XR Shoulder Left  . XR KNEE 3 VIEW RIGHT   No orders of the defined types were placed in this encounter.     Procedures: No procedures performed   Clinical Data: No additional findings.   Subjective: Chief Complaint  Patient presents with  . Left Shoulder - Pain, Injury    Brandon Fleming is a 71 y o here today for left shoulder pain x 5 months ago when he fell out of bed. He landed on his Left shoulder.  . Right Knee - Pain    Pt also has Right knee pain, concentrated behind the knee. No other symptoms  Brandon Fleming relates  that he has some pain along the posterior aspect of his right knee after he first gets up from a sitting position or when he begins to take a walk. After about a half a mile or so the pain seems to resolve. It always seems to be relatively localized in the popliteal space of his right knee. No back pain or thigh or calf discomfort. No numbness or tingling. No injury or trauma.  Left shoulder pain appears to be related to overhead activity not aware of any ecchymosis or erythema. There's been no numbness or tingling occasionally on some discomfort with overhead activity. No loss of motion.  HPI  Review of Systems  Constitutional: Negative for fatigue.  HENT: Positive for hearing loss.   Respiratory: Negative for apnea, chest tightness and shortness of breath.   Cardiovascular: Negative for chest pain, palpitations and leg swelling.  Gastrointestinal: Negative for blood in stool, constipation and diarrhea.  Genitourinary: Negative for difficulty urinating.  Musculoskeletal: Positive for gait problem. Negative for arthralgias, back pain, joint swelling, myalgias, neck pain and neck stiffness.  Neurological: Positive for dizziness. Negative for weakness, numbness and headaches.  Hematological: Does not bruise/bleed easily.  Psychiatric/Behavioral: Positive for sleep disturbance. The patient is not nervous/anxious.      Objective: Vital Signs:  There were no vitals taken for this visit.  Physical Exam  Ortho Exam awake alert and oriented 3. Comfortable sitting. Exam of the right knee reveals full flexion and extension. No effusion ecchymosis or induration. No popliteal mass or pain. No calf pain. Straight leg raise negative. Painless range of motion both hips. Localized tenderness along the medial lateral compartment. Mild patellar crepitation. No evidence of instability  Left shoulder exam was essentially benign. Minimal discomfort and extreme of external rotation. Able to quickly raise his arm  over his head. No pain at the acromioclavicular joint. For a minimal prominence of the acromioclavicular joint tear. No popping or clicking. Biceps intact. No pain along the biceps tendon. Negative empty can testing. Skin intact. No pain with range of motion of cervical spine No specialty comments available.  Imaging: Xr Knee 3 View Right  Result Date: 12/04/2017 Films of the right knee repeat in 3 projections standing. There is mild decrease in the medial joint space but without ectopic calcification. Some mild degenerative changes about the patellofemoral joint as well. No ectopic calcification. No popliteal changes. Excision consistent with mild osteoarthritis right knee  Xr Shoulder Left  Result Date: 12/04/2017 Films of the left shoulder pain several projections. There are some mild degenerative changes about the glenohumeral joint  with a small inferiorly directed humeral head spur. Humeral head is centered about the glenoid or ectopic calcification. There are degenerative changes about the acromioclavicular joint no evidence of fracture    PMFS History: There are no active problems to display for this patient.  Past Medical History:  Diagnosis Date  . Kidney damage     Family History  Problem Relation Age of Onset  . Anesthesia problems Neg Hx   . Broken bones Neg Hx   . Cancer Neg Hx   . Clotting disorder Neg Hx   . Collagen disease Neg Hx   . Diabetes Neg Hx   . Dislocations Neg Hx   . Osteoporosis Neg Hx   . Rheumatologic disease Neg Hx   . Scoliosis Neg Hx   . Severe sprains Neg Hx     Past Surgical History:  Procedure Laterality Date  . KNEE SURGERY     Social History   Occupational History  . Not on file  Tobacco Use  . Smoking status: Former Research scientist (life sciences)  . Smokeless tobacco: Never Used  Substance and Sexual Activity  . Alcohol use: Yes  . Drug use: No  . Sexual activity: Not on file

## 2017-12-17 ENCOUNTER — Telehealth (INDEPENDENT_AMBULATORY_CARE_PROVIDER_SITE_OTHER): Payer: Self-pay | Admitting: Orthopaedic Surgery

## 2017-12-17 NOTE — Telephone Encounter (Signed)
Patient's wife Rise Paganini called stating that both of his legs are now painful and he is having trouble with balance.  She is wondering if there are any additional tests that can be done.  CB [phone]

## 2017-12-18 ENCOUNTER — Telehealth (INDEPENDENT_AMBULATORY_CARE_PROVIDER_SITE_OTHER): Payer: Self-pay | Admitting: Orthopaedic Surgery

## 2017-12-18 ENCOUNTER — Other Ambulatory Visit (INDEPENDENT_AMBULATORY_CARE_PROVIDER_SITE_OTHER): Payer: Self-pay

## 2017-12-18 DIAGNOSIS — G8929 Other chronic pain: Secondary | ICD-10-CM

## 2017-12-18 DIAGNOSIS — M25561 Pain in right knee: Principal | ICD-10-CM

## 2017-12-18 NOTE — Telephone Encounter (Signed)
OK to refer.

## 2017-12-18 NOTE — Telephone Encounter (Signed)
Spoke with wife and suggested her husband see a neurologist. Pt has one at [facility]

## 2017-12-18 NOTE — Telephone Encounter (Signed)
Patient's wife Rise Paganini called to let you know that they spoke with Dr. Silvestre Moment (neurologist) on [facility].  They were told they couldn't make an appointment until we send notes to their office.  CB# [phone]

## 2017-12-18 NOTE — Telephone Encounter (Signed)
Yes, refer to Dr Arnoldo Morale

## 2017-12-21 ENCOUNTER — Ambulatory Visit (INDEPENDENT_AMBULATORY_CARE_PROVIDER_SITE_OTHER): Payer: Medicare Other | Admitting: Orthopaedic Surgery

## 2017-12-21 ENCOUNTER — Encounter (INDEPENDENT_AMBULATORY_CARE_PROVIDER_SITE_OTHER): Payer: Self-pay | Admitting: Orthopaedic Surgery

## 2017-12-21 VITALS — BP 127/71 | HR 84 | Resp 18 | Ht 67.0 in | Wt 185.0 lb

## 2017-12-21 DIAGNOSIS — M25512 Pain in left shoulder: Secondary | ICD-10-CM

## 2017-12-21 DIAGNOSIS — G8929 Other chronic pain: Secondary | ICD-10-CM

## 2017-12-21 NOTE — Progress Notes (Deleted)
   Office Visit Note   Patient: Brandon Fleming           Date of Birth: [birth year]           MRN: [id number] Visit Date: 12/21/2017              Requested by: Prince Solian, MD [address] [place],  [address] PCP: Prince Solian, MD   Assessment & Plan: Visit Diagnoses: No diagnosis found.  Plan: ***  Follow-Up Instructions: No Follow-up on file.   Orders:  No orders of the defined types were placed in this encounter.  No orders of the defined types were placed in this encounter.     Procedures: No procedures performed   Clinical Data: No additional findings.   Subjective: Chief Complaint  Patient presents with  . Left Shoulder - Pain    HPI  Review of Systems  Constitutional: Negative for fatigue.  HENT: Negative for hearing loss.   Respiratory: Negative for apnea, chest tightness and shortness of breath.   Cardiovascular: Negative for chest pain, palpitations and leg swelling.  Gastrointestinal: Negative for blood in stool, constipation and diarrhea.  Genitourinary: Negative for difficulty urinating.  Musculoskeletal: Positive for arthralgias and joint swelling. Negative for back pain, myalgias, neck pain and neck stiffness.  Neurological: Negative for weakness, numbness and headaches.  Hematological: Does not bruise/bleed easily.  Psychiatric/Behavioral: Negative for sleep disturbance. The patient is not nervous/anxious.      Objective: Vital Signs: BP 127/71   Pulse 84   Resp 18   Ht 5\' 7"  (1.702 m)   Wt 185 lb (83.9 kg)   BMI 28.98 kg/m   Physical Exam  Ortho Exam  Specialty Comments:  No specialty comments available.  Imaging: No results found.   PMFS History: There are no active problems to display for this patient.  Past Medical History:  Diagnosis Date  . Kidney damage     Family History  Problem Relation Age of Onset  . Anesthesia problems Neg Hx   . Broken bones Neg Hx   . Cancer Neg Hx   . Clotting  disorder Neg Hx   . Collagen disease Neg Hx   . Diabetes Neg Hx   . Dislocations Neg Hx   . Osteoporosis Neg Hx   . Rheumatologic disease Neg Hx   . Scoliosis Neg Hx   . Severe sprains Neg Hx     Past Surgical History:  Procedure Laterality Date  . KNEE SURGERY     Social History   Occupational History  . Not on file  Tobacco Use  . Smoking status: Former Research scientist (life sciences)  . Smokeless tobacco: Never Used  Substance and Sexual Activity  . Alcohol use: Yes  . Drug use: No  . Sexual activity: Not on file

## 2017-12-21 NOTE — Telephone Encounter (Signed)
done

## 2017-12-21 NOTE — Progress Notes (Signed)
   Office Visit Note   Patient: ROMELLO HOEHN           Date of Birth: 11-25-1946           MRN: [id number] Visit Date: 12/21/2017              Requested by: Prince Solian, MD [address] [place], [place] [address] PCP: Prince Solian, MD   Assessment & Plan: Visit Diagnoses:  1. Chronic left shoulder pain     Plan: Mr. Bachar was recently seen for evaluation of left shoulder pain we had discussed different treatment options including a cortisone injection. The time he was not interested but is having more trouble and decided that he was ready for the cortisone injection. The injection was performed today in the subacromial region with almost immediate relief of his pain  Follow-Up Instructions: Return if symptoms worsen or fail to improve.   Orders:  No orders of the defined types were placed in this encounter.  No orders of the defined types were placed in this encounter.     Procedures: No procedures performed   Clinical Data: No additional findings.   Subjective: Chief Complaint  Patient presents with  . Left Shoulder - Pain  Seen recently for evaluation of knee and left shoulder pain. With persistent difficulty raising his left arm over head and pain with sleep he would like to have a cortisone injection. No other injury or trauma.  HPI  Review of Systems   Objective: Vital Signs: BP 127/71   Pulse 84   Resp 18   Ht 5\' 7"  (1.702 m)   Wt 185 lb (83.9 kg)   BMI 28.98 kg/m   Physical Exam  Ortho Exam left shoulder with a painful overhead arc of motion. Positive impingement. Positive but can testing. Some pain along the anterior lateral subacromial region with abduction. Biceps intact. No pain with range of motion of cervical spine. Good grip and good release  Specialty Comments:  No specialty comments available.  Imaging: No results found.   PMFS History: There are no active problems to display for this patient.  Past Medical History:    Diagnosis Date  . Kidney damage     Family History  Problem Relation Age of Onset  . Anesthesia problems Neg Hx   . Broken bones Neg Hx   . Cancer Neg Hx   . Clotting disorder Neg Hx   . Collagen disease Neg Hx   . Diabetes Neg Hx   . Dislocations Neg Hx   . Osteoporosis Neg Hx   . Rheumatologic disease Neg Hx   . Scoliosis Neg Hx   . Severe sprains Neg Hx     Past Surgical History:  Procedure Laterality Date  . KNEE SURGERY     Social History   Occupational History  . Not on file  Tobacco Use  . Smoking status: Former Research scientist (life sciences)  . Smokeless tobacco: Never Used  Substance and Sexual Activity  . Alcohol use: Yes  . Drug use: No  . Sexual activity: Not on file     Garald Balding, MD   Note - This record has been created using [employer].  Chart creation errors have been sought, but may not always  have been located. Such creation errors do not reflect on  the standard of medical care.

## 2018-01-15 DIAGNOSIS — Z6825 Body mass index (BMI) 25.0-25.9, adult: Secondary | ICD-10-CM | POA: Diagnosis not present

## 2018-01-15 DIAGNOSIS — M545 Low back pain: Secondary | ICD-10-CM | POA: Diagnosis not present

## 2018-01-15 DIAGNOSIS — M79604 Pain in right leg: Secondary | ICD-10-CM | POA: Diagnosis not present

## 2018-01-15 DIAGNOSIS — M79605 Pain in left leg: Secondary | ICD-10-CM | POA: Diagnosis not present

## 2018-01-26 DIAGNOSIS — M79604 Pain in right leg: Secondary | ICD-10-CM | POA: Diagnosis not present

## 2018-01-26 DIAGNOSIS — M48061 Spinal stenosis, lumbar region without neurogenic claudication: Secondary | ICD-10-CM | POA: Diagnosis not present

## 2018-01-26 DIAGNOSIS — M5126 Other intervertebral disc displacement, lumbar region: Secondary | ICD-10-CM | POA: Diagnosis not present

## 2018-01-27 DIAGNOSIS — M48062 Spinal stenosis, lumbar region with neurogenic claudication: Secondary | ICD-10-CM | POA: Diagnosis not present

## 2018-01-27 DIAGNOSIS — Z6825 Body mass index (BMI) 25.0-25.9, adult: Secondary | ICD-10-CM | POA: Diagnosis not present

## 2018-01-27 DIAGNOSIS — I10 Essential (primary) hypertension: Secondary | ICD-10-CM | POA: Diagnosis not present

## 2018-01-27 DIAGNOSIS — M4316 Spondylolisthesis, lumbar region: Secondary | ICD-10-CM | POA: Diagnosis not present

## 2018-02-15 ENCOUNTER — Ambulatory Visit (INDEPENDENT_AMBULATORY_CARE_PROVIDER_SITE_OTHER): Payer: Medicare Other | Admitting: Orthopaedic Surgery

## 2018-02-15 ENCOUNTER — Encounter (INDEPENDENT_AMBULATORY_CARE_PROVIDER_SITE_OTHER): Payer: Self-pay | Admitting: Orthopaedic Surgery

## 2018-02-15 VITALS — BP 121/74 | HR 74 | Ht 66.0 in | Wt 165.0 lb

## 2018-02-15 DIAGNOSIS — M25511 Pain in right shoulder: Secondary | ICD-10-CM | POA: Diagnosis not present

## 2018-02-15 MED ORDER — BUPIVACAINE HCL 0.5 % IJ SOLN
2.0000 mL | INTRAMUSCULAR | Status: AC | PRN
  Administered 2018-02-15: 2 mL via INTRA_ARTICULAR

## 2018-02-15 MED ORDER — LIDOCAINE HCL 2 % IJ SOLN
2.0000 mL | INTRAMUSCULAR | Status: AC | PRN
  Administered 2018-02-15: 2 mL

## 2018-02-15 MED ORDER — METHYLPREDNISOLONE ACETATE 40 MG/ML IJ SUSP
80.0000 mg | INTRAMUSCULAR | Status: AC | PRN
  Administered 2018-02-15: 80 mg

## 2018-02-15 NOTE — Progress Notes (Signed)
Office Visit Note   Patient: Brandon Fleming           Date of Birth: October 23, 1946           MRN: [id number] Visit Date: 02/15/2018              Requested by: Prince Solian, MD [address] [place], [place] [address] PCP: Prince Solian, MD   Assessment & Plan: Visit Diagnoses:  1. Acute pain of right shoulder     Plan: Mild impingement syndrome right shoulder without injury or trauma. Had an excellent response to a subacromial injection left shoulder 2 months ago. Will inject subacromial area right shoulder today and monitor her response  Follow-Up Instructions: Return if symptoms worsen or fail to improve.   Orders:  No orders of the defined types were placed in this encounter.  No orders of the defined types were placed in this encounter.     Procedures: Large Joint Inj: R subacromial bursa on 02/15/2018 8:28 AM Indications: pain and diagnostic evaluation Details: 25 G 1.5 in needle, anterolateral approach  Arthrogram: No  Medications: 2 mL lidocaine 2 %; 2 mL bupivacaine 0.5 %; 80 mg methylPREDNISolone acetate 40 MG/ML Consent was given by the patient. Immediately prior to procedure a time out was called to verify the correct patient, procedure, equipment, support staff and site/side marked as required. Patient was prepped and draped in the usual sterile fashion.       Clinical Data: No additional findings.   Subjective: Chief Complaint  Patient presents with  . Right Shoulder - Pain    Mr krist is a 5 here with right shoulder pain no new injury, no prior injection or x rays. Shoulder pain for about 1 year.  Seen on January 7 for evaluation of left shoulder pain. Had mild impingement symptoms. Responded very nicely to a subacromial cortisone injection. Has had some mild right shoulder pain for months which seems to have progressed over the last 3-4 weeks possibly related to his flu. Having some difficulty raising his arm over his head. No numbness or  tingling. No neck pain. No history of injury or trauma.  HPI  Review of Systems  Constitutional: Positive for fatigue.  Eyes: Negative for pain.  Respiratory: Negative for cough and shortness of breath.   Gastrointestinal: Positive for constipation. Negative for blood in stool and diarrhea.  Genitourinary: Negative for dysuria and flank pain.  Musculoskeletal: Negative for back pain, neck pain and neck stiffness.  Skin: Negative for rash and wound.  Allergic/Immunologic: Positive for food allergies.  Neurological: Positive for dizziness, weakness and light-headedness. Negative for headaches.  Psychiatric/Behavioral: Positive for sleep disturbance. The patient is not nervous/anxious.      Objective: Vital Signs: BP 121/74 (BP Location: Right Arm, Patient Position: Sitting, Cuff Size: Normal)   Pulse 74   Ht 5\' 6"  (1.676 m)   Wt 165 lb (74.8 kg)   BMI 26.63 kg/m   Physical Exam  Ortho Exam awake alert and oriented 3. Comfortable sitting mild impingement symptoms right shoulder. Negative empty can. Skin intact. Biceps intact. Good grip and good release. Some mild discomfort with overhead motion of shoulder. No weakness  Specialty Comments:  No specialty comments available.  Imaging: No results found.   PMFS History: There are no active problems to display for this patient.  Past Medical History:  Diagnosis Date  . Kidney damage     Family History  Problem Relation Age of Onset  . Anesthesia problems Neg  Hx   . Broken bones Neg Hx   . Cancer Neg Hx   . Clotting disorder Neg Hx   . Collagen disease Neg Hx   . Diabetes Neg Hx   . Dislocations Neg Hx   . Osteoporosis Neg Hx   . Rheumatologic disease Neg Hx   . Scoliosis Neg Hx   . Severe sprains Neg Hx     Past Surgical History:  Procedure Laterality Date  . KNEE SURGERY     Social History   Occupational History  . Not on file  Tobacco Use  . Smoking status: Former Research scientist (life sciences)  . Smokeless tobacco: Never Used    Substance and Sexual Activity  . Alcohol use: Yes  . Drug use: No  . Sexual activity: Not on file

## 2018-02-25 ENCOUNTER — Ambulatory Visit (HOSPITAL_COMMUNITY)
Admission: RE | Admit: 2018-02-25 | Discharge: 2018-02-25 | Disposition: A | Discharge status: Home / Self Care | Payer: Medicare Other | Source: Ambulatory Visit | Attending: Internal Medicine | Admitting: Internal Medicine

## 2018-02-25 ENCOUNTER — Other Ambulatory Visit (HOSPITAL_COMMUNITY): Payer: Self-pay | Admitting: Internal Medicine

## 2018-02-25 DIAGNOSIS — R531 Weakness: Secondary | ICD-10-CM | POA: Diagnosis not present

## 2018-02-25 DIAGNOSIS — G319 Degenerative disease of nervous system, unspecified: Secondary | ICD-10-CM | POA: Insufficient documentation

## 2018-02-25 DIAGNOSIS — J019 Acute sinusitis, unspecified: Secondary | ICD-10-CM

## 2018-02-25 DIAGNOSIS — I6782 Cerebral ischemia: Secondary | ICD-10-CM

## 2018-02-25 DIAGNOSIS — R4182 Altered mental status, unspecified: Secondary | ICD-10-CM

## 2018-02-25 DIAGNOSIS — N390 Urinary tract infection, site not specified: Secondary | ICD-10-CM | POA: Diagnosis not present

## 2018-02-25 DIAGNOSIS — R251 Tremor, unspecified: Secondary | ICD-10-CM | POA: Diagnosis not present

## 2018-02-25 DIAGNOSIS — F10239 Alcohol dependence with withdrawal, unspecified: Secondary | ICD-10-CM | POA: Diagnosis not present

## 2018-02-25 DIAGNOSIS — E7849 Other hyperlipidemia: Secondary | ICD-10-CM | POA: Diagnosis not present

## 2018-02-25 DIAGNOSIS — R8290 Unspecified abnormal findings in urine: Secondary | ICD-10-CM | POA: Diagnosis not present

## 2018-02-26 ENCOUNTER — Other Ambulatory Visit: Payer: Self-pay

## 2018-02-26 ENCOUNTER — Inpatient Hospital Stay (HOSPITAL_COMMUNITY)
Admission: EM | Admit: 2018-02-26 | Discharge: 2018-02-28 | DRG: 897 | Disposition: A | Discharge status: Home / Self Care | Payer: Medicare Other | Attending: Family Medicine | Admitting: Family Medicine

## 2018-02-26 ENCOUNTER — Emergency Department (HOSPITAL_COMMUNITY): Payer: Medicare Other

## 2018-02-26 ENCOUNTER — Encounter (HOSPITAL_COMMUNITY): Payer: Self-pay | Admitting: Emergency Medicine

## 2018-02-26 DIAGNOSIS — F1010 Alcohol abuse, uncomplicated: Secondary | ICD-10-CM | POA: Diagnosis present

## 2018-02-26 DIAGNOSIS — F10939 Alcohol use, unspecified with withdrawal, unspecified: Secondary | ICD-10-CM | POA: Diagnosis present

## 2018-02-26 DIAGNOSIS — E872 Acidosis: Secondary | ICD-10-CM | POA: Diagnosis not present

## 2018-02-26 DIAGNOSIS — D696 Thrombocytopenia, unspecified: Secondary | ICD-10-CM | POA: Diagnosis present

## 2018-02-26 DIAGNOSIS — Z87891 Personal history of nicotine dependence: Secondary | ICD-10-CM

## 2018-02-26 DIAGNOSIS — R451 Restlessness and agitation: Secondary | ICD-10-CM | POA: Diagnosis present

## 2018-02-26 DIAGNOSIS — R531 Weakness: Secondary | ICD-10-CM | POA: Diagnosis present

## 2018-02-26 DIAGNOSIS — F10239 Alcohol dependence with withdrawal, unspecified: Principal | ICD-10-CM | POA: Diagnosis present

## 2018-02-26 DIAGNOSIS — I10 Essential (primary) hypertension: Secondary | ICD-10-CM | POA: Diagnosis present

## 2018-02-26 DIAGNOSIS — F329 Major depressive disorder, single episode, unspecified: Secondary | ICD-10-CM | POA: Diagnosis present

## 2018-02-26 DIAGNOSIS — R251 Tremor, unspecified: Secondary | ICD-10-CM | POA: Diagnosis not present

## 2018-02-26 DIAGNOSIS — R112 Nausea with vomiting, unspecified: Secondary | ICD-10-CM | POA: Diagnosis present

## 2018-02-26 DIAGNOSIS — E785 Hyperlipidemia, unspecified: Secondary | ICD-10-CM | POA: Diagnosis present

## 2018-02-26 DIAGNOSIS — R296 Repeated falls: Secondary | ICD-10-CM | POA: Diagnosis present

## 2018-02-26 DIAGNOSIS — R809 Proteinuria, unspecified: Secondary | ICD-10-CM | POA: Diagnosis present

## 2018-02-26 DIAGNOSIS — R740 Nonspecific elevation of levels of transaminase and lactic acid dehydrogenase [LDH]: Secondary | ICD-10-CM | POA: Diagnosis present

## 2018-02-26 DIAGNOSIS — Z79899 Other long term (current) drug therapy: Secondary | ICD-10-CM

## 2018-02-26 HISTORY — DX: Hyperlipidemia, unspecified: E78.5

## 2018-02-26 HISTORY — DX: Essential (primary) hypertension: I10

## 2018-02-26 LAB — CBC WITH DIFFERENTIAL/PLATELET
Basophils Absolute: 0 10*3/uL (ref 0.0–0.1)
Basophils Relative: 0 %
Eosinophils Absolute: 0 10*3/uL (ref 0.0–0.7)
Eosinophils Relative: 0 %
HCT: 38.3 % — ABNORMAL LOW (ref 39.0–52.0)
HEMOGLOBIN: 13.2 g/dL (ref 13.0–17.0)
LYMPHS ABS: 0.8 10*3/uL (ref 0.7–4.0)
LYMPHS PCT: 14 %
MCH: 32.6 pg (ref 26.0–34.0)
MCHC: 34.5 g/dL (ref 30.0–36.0)
MCV: 94.6 fL (ref 78.0–100.0)
Monocytes Absolute: 0.6 10*3/uL (ref 0.1–1.0)
Monocytes Relative: 11 %
NEUTROS ABS: 4.2 10*3/uL (ref 1.7–7.7)
NEUTROS PCT: 75 %
Platelets: 173 10*3/uL (ref 150–400)
RBC: 4.05 MIL/uL — AB (ref 4.22–5.81)
RDW: 13.6 % (ref 11.5–15.5)
WBC: 5.6 10*3/uL (ref 4.0–10.5)

## 2018-02-26 LAB — COMPREHENSIVE METABOLIC PANEL
ALT: 36 U/L (ref 17–63)
AST: 48 U/L — AB (ref 15–41)
Albumin: 4.4 g/dL (ref 3.5–5.0)
Alkaline Phosphatase: 54 U/L (ref 38–126)
Anion gap: 16 — ABNORMAL HIGH (ref 5–15)
BUN: 24 mg/dL — ABNORMAL HIGH (ref 6–20)
CHLORIDE: 107 mmol/L (ref 101–111)
CO2: 19 mmol/L — ABNORMAL LOW (ref 22–32)
Calcium: 10.1 mg/dL (ref 8.9–10.3)
Creatinine, Ser: 1.19 mg/dL (ref 0.61–1.24)
Glucose, Bld: 157 mg/dL — ABNORMAL HIGH (ref 65–99)
POTASSIUM: 3.7 mmol/L (ref 3.5–5.1)
Sodium: 142 mmol/L (ref 135–145)
Total Bilirubin: 1.7 mg/dL — ABNORMAL HIGH (ref 0.3–1.2)
Total Protein: 7.4 g/dL (ref 6.5–8.1)

## 2018-02-26 LAB — URINALYSIS, ROUTINE W REFLEX MICROSCOPIC
Bacteria, UA: NONE SEEN
Bilirubin Urine: NEGATIVE
Glucose, UA: NEGATIVE mg/dL
Hgb urine dipstick: NEGATIVE
KETONES UR: 5 mg/dL — AB
Leukocytes, UA: NEGATIVE
Nitrite: NEGATIVE
PH: 6 (ref 5.0–8.0)
Protein, ur: 100 mg/dL — AB
Specific Gravity, Urine: 1.023 (ref 1.005–1.030)

## 2018-02-26 LAB — CBC
HCT: 37.6 % — ABNORMAL LOW (ref 39.0–52.0)
Hemoglobin: 12.8 g/dL — ABNORMAL LOW (ref 13.0–17.0)
MCH: 32.4 pg (ref 26.0–34.0)
MCHC: 34 g/dL (ref 30.0–36.0)
MCV: 95.2 fL (ref 78.0–100.0)
PLATELETS: 161 10*3/uL (ref 150–400)
RBC: 3.95 MIL/uL — ABNORMAL LOW (ref 4.22–5.81)
RDW: 13.4 % (ref 11.5–15.5)
WBC: 4.4 10*3/uL (ref 4.0–10.5)

## 2018-02-26 LAB — I-STAT CG4 LACTIC ACID, ED: LACTIC ACID, VENOUS: 2.18 mmol/L — AB (ref 0.5–1.9)

## 2018-02-26 LAB — ETHANOL: Alcohol, Ethyl (B): <10 mg/dL (ref <10)

## 2018-02-26 MED ORDER — ROSUVASTATIN CALCIUM 5 MG PO TABS
10.0000 mg | ORAL_TABLET | Freq: Every day | ORAL | Status: DC
  Administered 2018-02-28: 10 mg via ORAL
  Filled 2018-02-26 (×2): qty 2

## 2018-02-26 MED ORDER — LORAZEPAM 2 MG/ML IJ SOLN: 0.0000 mg | Freq: Two times a day (BID) | INTRAMUSCULAR | Status: DC

## 2018-02-26 MED ORDER — ENOXAPARIN SODIUM 40 MG/0.4ML ~~LOC~~ SOLN
40.0000 mg | SUBCUTANEOUS | Status: DC
  Administered 2018-02-27 – 2018-02-28 (×2): 40 mg via SUBCUTANEOUS
  Filled 2018-02-26 (×2): qty 0.4

## 2018-02-26 MED ORDER — AMLODIPINE BESYLATE 5 MG PO TABS
5.0000 mg | ORAL_TABLET | Freq: Every day | ORAL | Status: DC
  Administered 2018-02-28: 5 mg via ORAL
  Filled 2018-02-26 (×2): qty 1

## 2018-02-26 MED ORDER — ADULT MULTIVITAMIN W/MINERALS CH: 1.0000 | ORAL_TABLET | Freq: Every day | ORAL | Status: DC

## 2018-02-26 MED ORDER — LORAZEPAM 1 MG PO TABS: 0.0000 mg | ORAL_TABLET | Freq: Two times a day (BID) | ORAL | Status: DC

## 2018-02-26 MED ORDER — ONDANSETRON HCL 4 MG PO TABS: 4.0000 mg | ORAL_TABLET | Freq: Four times a day (QID) | ORAL | Status: DC | PRN

## 2018-02-26 MED ORDER — LORAZEPAM 1 MG PO TABS
0.0000 mg | ORAL_TABLET | Freq: Four times a day (QID) | ORAL | Status: DC
  Administered 2018-02-27 – 2018-02-28 (×4): 2 mg via ORAL
  Filled 2018-02-26 (×4): qty 2
  Filled 2018-02-26: qty 4

## 2018-02-26 MED ORDER — LORAZEPAM 2 MG/ML IJ SOLN
1.0000 mg | Freq: Four times a day (QID) | INTRAMUSCULAR | Status: DC | PRN
  Administered 2018-02-27: 2 mg via INTRAVENOUS
  Administered 2018-02-27 – 2018-02-28 (×2): 1 mg via INTRAVENOUS
  Filled 2018-02-26 (×3): qty 1

## 2018-02-26 MED ORDER — LISINOPRIL 20 MG PO TABS
20.0000 mg | ORAL_TABLET | Freq: Every day | ORAL | Status: DC
  Administered 2018-02-28: 20 mg via ORAL
  Filled 2018-02-26 (×2): qty 1

## 2018-02-26 MED ORDER — THIAMINE HCL 100 MG/ML IJ SOLN
100.0000 mg | Freq: Every day | INTRAMUSCULAR | Status: DC
  Administered 2018-02-26: 100 mg via INTRAVENOUS
  Filled 2018-02-26: qty 2

## 2018-02-26 MED ORDER — FOLIC ACID 1 MG PO TABS
1.0000 mg | ORAL_TABLET | Freq: Every day | ORAL | Status: DC
  Administered 2018-02-28: 1 mg via ORAL
  Filled 2018-02-26 (×2): qty 1

## 2018-02-26 MED ORDER — DULOXETINE HCL 60 MG PO CPEP
60.0000 mg | ORAL_CAPSULE | Freq: Every day | ORAL | Status: DC
  Administered 2018-02-28: 60 mg via ORAL
  Filled 2018-02-26 (×2): qty 1

## 2018-02-26 MED ORDER — VITAMIN B-1 100 MG PO TABS: 100.0000 mg | ORAL_TABLET | Freq: Every day | ORAL | Status: DC

## 2018-02-26 MED ORDER — ACETAMINOPHEN 325 MG PO TABS: 650.0000 mg | ORAL_TABLET | Freq: Four times a day (QID) | ORAL | Status: DC | PRN

## 2018-02-26 MED ORDER — VITAMIN B-1 100 MG PO TABS
100.0000 mg | ORAL_TABLET | Freq: Every day | ORAL | Status: DC
  Administered 2018-02-27: 100 mg via ORAL
  Filled 2018-02-26: qty 1

## 2018-02-26 MED ORDER — FENOFIBRATE 160 MG PO TABS
160.0000 mg | ORAL_TABLET | Freq: Every day | ORAL | Status: DC
  Administered 2018-02-28: 160 mg via ORAL
  Filled 2018-02-26 (×2): qty 1

## 2018-02-26 MED ORDER — PANTOPRAZOLE SODIUM 40 MG PO TBEC
40.0000 mg | DELAYED_RELEASE_TABLET | Freq: Every day | ORAL | Status: DC
  Administered 2018-02-28: 40 mg via ORAL
  Filled 2018-02-26 (×2): qty 1

## 2018-02-26 MED ORDER — LORAZEPAM 2 MG/ML IJ SOLN
0.0000 mg | Freq: Four times a day (QID) | INTRAMUSCULAR | Status: DC
  Administered 2018-02-26: 1 mg via INTRAVENOUS
  Administered 2018-02-26: 2 mg via INTRAVENOUS
  Filled 2018-02-26 (×2): qty 1

## 2018-02-26 MED ORDER — ONDANSETRON HCL 4 MG/2ML IJ SOLN: 4.0000 mg | Freq: Four times a day (QID) | INTRAMUSCULAR | Status: DC | PRN

## 2018-02-26 MED ORDER — THIAMINE HCL 100 MG/ML IJ SOLN: 100.0000 mg | Freq: Every day | INTRAMUSCULAR | Status: DC

## 2018-02-26 MED ORDER — ACETAMINOPHEN 650 MG RE SUPP: 650.0000 mg | Freq: Four times a day (QID) | RECTAL | Status: DC | PRN

## 2018-02-26 MED ORDER — ASPIRIN EC 81 MG PO TBEC
81.0000 mg | DELAYED_RELEASE_TABLET | Freq: Every day | ORAL | Status: DC
  Administered 2018-02-28: 81 mg via ORAL
  Filled 2018-02-26 (×2): qty 1

## 2018-02-26 MED ORDER — LORAZEPAM 1 MG PO TABS: 0.0000 mg | ORAL_TABLET | Freq: Four times a day (QID) | ORAL | Status: DC

## 2018-02-26 MED ORDER — ADULT MULTIVITAMIN W/MINERALS CH
1.0000 | ORAL_TABLET | Freq: Every day | ORAL | Status: DC
  Administered 2018-02-28: 1 via ORAL
  Filled 2018-02-26 (×2): qty 1

## 2018-02-26 MED ORDER — LORAZEPAM 1 MG PO TABS
1.0000 mg | ORAL_TABLET | Freq: Four times a day (QID) | ORAL | Status: DC | PRN
  Administered 2018-02-27: 1 mg via ORAL
  Filled 2018-02-26: qty 1

## 2018-02-26 NOTE — ED Notes (Signed)
Patient transported to X-ray 

## 2018-02-26 NOTE — ED Provider Notes (Signed)
Patient placed in Quick Look pathway, seen and evaluated   Chief Complaint: Weakness and tremors HPI:   Patient here with complaints of emesis and weakness.  Patient states this is how he felt when he had the flu a couple weeks ago.  He is accompanied by family member to say that he is too tremulous and too weak to walk.  Patient may have a history of alcohol abuse but this is difficult to ascertain in the current situation.  No fevers or chills, no urinary symptoms.  Told by the PA at [facility] that he may have thiamine deficiency.  ROS: Tremors and emesis   Physical Exam:   Gen: No distress  Neuro: Awake and Alert  Skin: Warm    Focused Exam: Patient extremely tremulous, tachycardic.  No cough no abnormal lung sounds, benign abdominal exam   Initiation of care has begun. The patient has been counseled on the process, plan, and necessity for staying for the completion/evaluation, and the remainder of the medical screening examination    Margarita Mail, Hershal Coria 02/26/18 [id number]    Fatima Blank, MD 02/27/18 [phone]

## 2018-02-26 NOTE — H&P (Addendum)
History and Physical    Brandon Fleming XQJ:[id number] DOB: Sep 16, 1946 DOA: 02/26/2018  PCP: Prince Solian, MD  Patient coming from: Home.  Chief Complaint: Nausea vomiting and weakness and tremors.  HPI: Brandon Fleming is a 72 y.o. male with history of hypertension, hyperlipidemia has been having increasing weakness tremors and nausea vomiting and flulike symptoms over the last 6 weeks.  Patient states 6 weeks ago patient's primary care physician had treated for flu empirically.  Despite taking which patient was still having weakness and denies body aches with last few days having some nausea vomiting and poor appetite.  Admits to drinking alcohol every day.  Denies any abdominal pain.  Patient also has been noticing increasing tremors last few months.  Has had falls frequently.  Patient's primary care physician had CT head done yesterday which was unremarkable.  ED Course: In the ER patient is found to be tremulous mild tachycardic.  Labs are largely unremarkable.  EKG was showing sinus rhythm.  Patient admitted for possible alcohol withdrawal and further workup for nausea vomiting.  Family also states patient has become more forgetful.  Review of Systems: As per HPI, rest all negative.   Past Medical History:  Diagnosis Date  . Hyperlipidemia   . Hypertension   . Kidney damage     Past Surgical History:  Procedure Laterality Date  . KNEE SURGERY       reports that he has quit smoking. he has never used smokeless tobacco. He reports that he drinks alcohol. He reports that he does not use drugs.  No Known Allergies  Family History  Problem Relation Age of Onset  . CAD Father   . Anesthesia problems Neg Hx   . Broken bones Neg Hx   . Cancer Neg Hx   . Clotting disorder Neg Hx   . Collagen disease Neg Hx   . Diabetes Neg Hx   . Dislocations Neg Hx   . Osteoporosis Neg Hx   . Rheumatologic disease Neg Hx   . Scoliosis Neg Hx   . Severe sprains Neg Hx     Prior to  Admission medications   Medication Sig Start Date End Date Taking? Authorizing Provider  amLODipine (NORVASC) 5 MG tablet  11/02/17  Yes [provider]  aspirin EC 81 MG tablet Take 81 mg by mouth daily.   Yes [provider]  DULoxetine (CYMBALTA) 60 MG capsule Take by mouth.   Yes [provider]  esomeprazole (NEXIUM) 20 MG capsule Take 20 mg by mouth daily at 12 noon.   Yes [provider]  fenofibrate (TRICOR) 145 MG tablet Take 145 mg by mouth daily. 02/17/18  Yes [provider]  lisinopril (PRINIVIL,ZESTRIL) 20 MG tablet Take by mouth daily.  12/22/13  Yes [provider]  Multiple Vitamins-Minerals (MULTIVITAMIN WITH MINERALS) tablet Take 1 tablet by mouth daily.   Yes [provider]  oxyCODONE-acetaminophen (PERCOCET/ROXICET) 5-325 MG tablet Take 1 tablet by mouth daily as needed. 01/29/18  Yes [provider]  psyllium (METAMUCIL) 0.52 g capsule Take 0.52 g by mouth every 6 (six) hours as needed.    Yes [provider]  rosuvastatin (CRESTOR) 10 MG tablet  12/28/13  Yes [provider]    Physical Exam: Vitals:   02/26/18 2000 02/26/18 2045 02/26/18 2130 02/26/18 2222  BP: (!) 144/91 132/82 (!) 137/94 133/78  Pulse: (!) 102 (!) 102 (!) 106 (!) 101  Resp: 17 16 18 20   Temp:    [name] Kitchen)  97.4 F (36.3 C)  TempSrc:    Oral  SpO2: 100% 98% 98% 99%  Weight:    72 kg (158 lb 11.7 oz)  Height:    5\' 6"  (1.676 m)      Constitutional: Moderately built and nourished. Vitals:   02/26/18 2000 02/26/18 2045 02/26/18 2130 02/26/18 2222  BP: (!) 144/91 132/82 (!) 137/94 133/78  Pulse: (!) 102 (!) 102 (!) 106 (!) 101  Resp: 17 16 18 20   Temp:    (!) 97.4 F (36.3 C)  TempSrc:    Oral  SpO2: 100% 98% 98% 99%  Weight:    72 kg (158 lb 11.7 oz)  Height:    5\' 6"  (1.676 m)   Eyes: Anicteric no pallor. ENMT: No discharge from the ears eyes nose or mouth. Neck: No mass felt.  No neck  rigidity. Respiratory: No rhonchi or crepitations. Cardiovascular: S1-S2 heard no murmurs appreciated. Abdomen: Soft nontender bowel sounds present. Musculoskeletal: No edema.  No joint effusion. Skin: No rash. Neurologic: Alert awake oriented to time place and person.  Moves all extremities.  Bilateral tremors. Psychiatric: Appears normal.  Normal affect.   Labs on Admission: I have personally reviewed following labs and imaging studies  CBC: Recent Labs  Lab 02/26/18 1704  WBC 5.6  NEUTROABS 4.2  HGB 13.2  HCT 38.3*  MCV 94.6  PLT 818   Basic Metabolic Panel: Recent Labs  Lab 02/26/18 1704  NA 142  K 3.7  CL 107  CO2 19*  GLUCOSE 157*  BUN 24*  CREATININE 1.19  CALCIUM 10.1   GFR: Estimated Creatinine Clearance: 52.1 mL/min (by C-G formula based on SCr of 1.19 mg/dL). Liver Function Tests: Recent Labs  Lab 02/26/18 1704  AST 48*  ALT 36  ALKPHOS 54  BILITOT 1.7*  PROT 7.4  ALBUMIN 4.4   No results for input(s): LIPASE, AMYLASE in the last 168 hours. No results for input(s): AMMONIA in the last 168 hours. Coagulation Profile: No results for input(s): INR, PROTIME in the last 168 hours. Cardiac Enzymes: No results for input(s): CKTOTAL, CKMB, CKMBINDEX, TROPONINI in the last 168 hours. BNP (last 3 results) No results for input(s): PROBNP in the last 8760 hours. HbA1C: No results for input(s): HGBA1C in the last 72 hours. CBG: No results for input(s): GLUCAP in the last 168 hours. Lipid Profile: No results for input(s): CHOL, HDL, LDLCALC, TRIG, CHOLHDL, LDLDIRECT in the last 72 hours. Thyroid Function Tests: No results for input(s): TSH, T4TOTAL, FREET4, T3FREE, THYROIDAB in the last 72 hours. Anemia Panel: No results for input(s): VITAMINB12, FOLATE, FERRITIN, TIBC, IRON, RETICCTPCT in the last 72 hours. Urine analysis:    Component Value Date/Time   COLORURINE AMBER (A) 02/26/2018 1731   APPEARANCEUR CLEAR 02/26/2018 1731   LABSPEC 1.023  02/26/2018 1731   PHURINE 6.0 02/26/2018 1731   GLUCOSEU NEGATIVE 02/26/2018 1731   HGBUR NEGATIVE 02/26/2018 1731   BILIRUBINUR NEGATIVE 02/26/2018 1731   KETONESUR 5 (A) 02/26/2018 1731   PROTEINUR 100 (A) 02/26/2018 1731   UROBILINOGEN 1.0 03/19/2010 1410   NITRITE NEGATIVE 02/26/2018 1731   LEUKOCYTESUR NEGATIVE 02/26/2018 1731   Sepsis Labs: @LABRCNTIP (procalcitonin:4,lacticidven:4) )No results found for this or any previous visit (from the past 240 hour(s)).   Radiological Exams on Admission: Dg Chest 2 View  Result Date: 02/26/2018 CLINICAL DATA:  Body ache and tremors x1 day. EXAM: CHEST - 2 VIEW COMPARISON:  None. FINDINGS: The heart size and mediastinal contours are within normal limits. Slightly  low lung volumes with lordotic appearance of the frontal view. Both lungs are clear. The visualized skeletal structures are unremarkable. IMPRESSION: No active cardiopulmonary disease. Electronically Signed   By: Ashley Royalty M.D.   On: 02/26/2018 18:03   Ct Head Wo Contrast  Result Date: 02/25/2018 CLINICAL DATA:  Altered mental status EXAM: CT HEAD WITHOUT CONTRAST TECHNIQUE: Contiguous axial images were obtained from the base of the skull through the vertex without intravenous contrast. COMPARISON:  None. FINDINGS: Brain: The ventricles are normal in size and configuration. There is mild to moderate frontal and parietal lobe atrophy bilaterally. There is no intracranial mass, hemorrhage, extra-axial fluid collection, or midline shift. There is rather minimal small vessel disease in the centra semiovale bilaterally. Elsewhere gray-white compartments appear normal. No evident acute infarct. Vascular: No hyperdense vessel. There is no appreciable vascular calcification. Skull: Bony calvarium appears intact. Sinuses/Orbits: There is mucosal thickening in several ethmoid air cells bilaterally. There is mild mucosal thickening in the posterior right maxillary antrum. Other visualized paranasal  sinuses are clear. Question cataract removal on the right. Orbits otherwise appear symmetric bilaterally. Other: Mastoid air cells are clear. IMPRESSION: Frontal and parietal lobe atrophy bilaterally. Minimal periventricular small vessel disease. No acute infarct. No mass or hemorrhage. Areas of paranasal sinus disease. Question cataract removal on the right. Electronically Signed   By: Lowella Grip III M.D.   On: 02/25/2018 17:14    EKG: Independently reviewed.  Sinus tachycardia.  Assessment/Plan Active Problems:   Weakness   Essential hypertension   HLD (hyperlipidemia)   Tremors of nervous system   Alcohol abuse   Weakness generalized    1. Possible alcohol withdrawal -patient has tremors and tachycardia likely from alcohol withdrawal.  Patient admits to drinking bourbon every day.  Patient placed on CIWA protocol.  Get physical therapy consult.  CT head done yesterday was unremarkable.  Patient eventually will need further workup for tremors through neurology as outpatient. 2. Increasing forgetfulness -I have ordered ammonia levels B12 folate RPR and TSH.  Will check MRI brain.  Since patient is already on CIWA protocol and has received thiamine I have not ordered thiamine level. 3. Poor appetite with nausea vomiting -check CT abdomen and pelvis. 4. Hypertension on amlodipine and lisinopril which will be continued. 5. Depression on Cymbalta. 6. Hyperlipidemia on fenofibrate and statins.   DVT prophylaxis: Lovenox.   Code Status: Full code. Family Communication: Discussed with patient's wife. Disposition Plan: Home. Consults called: None. Admission status: Observation.   Rise Patience MD [facility] Pager [phone].  If 7PM-7AM, please contact night-coverage [email or web address] Password [facility]  02/26/2018, 10:52 PM

## 2018-02-26 NOTE — ED Notes (Signed)
Attempted report x1. 

## 2018-02-26 NOTE — ED Provider Notes (Signed)
[place] EMERGENCY DEPARTMENT Provider Note   CSN: [id number] Arrival date & time: 02/26/18  1428     History   Chief Complaint Chief Complaint  Patient presents with  . Emesis  . Tremors    HPI Brandon Fleming is a 72 y.o. male.  HPI Patient presents with his wife who provides much the HPI.  He presents with concern of tremor, weakness, nausea, vomiting. Patient does have multiple medical issues, related by the wife, including depression, alcohol use. They present due to increasing weakness, tremor, difficulty with walking. This is been episodically more severe, occurring over the past month, and today was much more pronounced than usual. Patient denies focal pain. They both note that the tremors have become particularly worse, more visible over the past 2 days. Patient saw his physician, had blood work performed yesterday, and family is unaware of results. Patient has been treated for influenza once during this season already.   Past Medical History:  Diagnosis Date  . Kidney damage   Depression Hypercholesterolemia    Past Surgical History:  Procedure Laterality Date  . KNEE SURGERY         Home Medications    Prior to Admission medications   Medication Sig Start Date End Date Taking? Authorizing Provider  amLODipine (NORVASC) 5 MG tablet  11/02/17  Yes [provider]  aspirin EC 81 MG tablet Take 81 mg by mouth daily.   Yes [provider]  DULoxetine (CYMBALTA) 60 MG capsule Take by mouth.   Yes [provider]  esomeprazole (NEXIUM) 20 MG capsule Take 20 mg by mouth daily at 12 noon.   Yes [provider]  fenofibrate (TRICOR) 145 MG tablet Take 145 mg by mouth daily. 02/17/18  Yes [provider]  lisinopril (PRINIVIL,ZESTRIL) 20 MG tablet Take by mouth daily.  12/22/13  Yes [provider]  Multiple Vitamins-Minerals (MULTIVITAMIN WITH MINERALS) tablet Take 1 tablet by mouth  daily.   Yes [provider]  oxyCODONE-acetaminophen (PERCOCET/ROXICET) 5-325 MG tablet Take 1 tablet by mouth daily as needed. 01/29/18  Yes [provider]  psyllium (METAMUCIL) 0.52 g capsule Take 0.52 g by mouth every 6 (six) hours as needed.    Yes [provider]  rosuvastatin (CRESTOR) 10 MG tablet  12/28/13  Yes [provider]    Family History Family History  Problem Relation Age of Onset  . Anesthesia problems Neg Hx   . Broken bones Neg Hx   . Cancer Neg Hx   . Clotting disorder Neg Hx   . Collagen disease Neg Hx   . Diabetes Neg Hx   . Dislocations Neg Hx   . Osteoporosis Neg Hx   . Rheumatologic disease Neg Hx   . Scoliosis Neg Hx   . Severe sprains Neg Hx     Social History Social History   Tobacco Use  . Smoking status: Former Research scientist (life sciences)  . Smokeless tobacco: Never Used  Substance Use Topics  . Alcohol use: Yes  . Drug use: No     Allergies   Patient has no known allergies.   Review of Systems Review of Systems  Constitutional:       Per HPI, otherwise negative  HENT:       Per HPI, otherwise negative  Respiratory:       Per HPI, otherwise negative  Cardiovascular:       Per HPI, otherwise negative  Gastrointestinal: Negative for vomiting.  Endocrine:  Negative aside from HPI  Genitourinary:       Neg aside from HPI   Musculoskeletal:       Per HPI, otherwise negative  Skin: Negative.   Neurological: Positive for tremors and weakness. Negative for syncope.     Physical Exam Updated Vital Signs BP 137/83   Pulse 100   Temp (!) 96.9 F (36.1 C) (Axillary)   Resp 16   SpO2 98%   Physical Exam  Constitutional: He is oriented to person, place, and time. He appears well-developed. No distress.  Uncomfortable appearing thin elderly male awake and alert  HENT:  Head: Normocephalic and atraumatic.  Eyes: Conjunctivae and EOM are normal.  Cardiovascular: Regular rhythm. Tachycardia present.    Pulmonary/Chest: Effort normal. No stridor. No respiratory distress.  Abdominal: He exhibits no distension.  Musculoskeletal: He exhibits no edema.  Neurological: He is alert and oriented to person, place, and time. He displays tremor.  Patient cannot completely follow commands, due to shaking, anxiousness  Skin: Skin is warm and dry.  Psychiatric: His mood appears anxious.  Nursing note and vitals reviewed.    ED Treatments / Results  Labs (all labs ordered are listed, but only abnormal results are displayed) Labs Reviewed  COMPREHENSIVE METABOLIC PANEL - Abnormal; Notable for the following components:      Result Value   CO2 19 (*)    Glucose, Bld 157 (*)    BUN 24 (*)    AST 48 (*)    Total Bilirubin 1.7 (*)    Anion gap 16 (*)    All other components within normal limits  CBC WITH DIFFERENTIAL/PLATELET - Abnormal; Notable for the following components:   RBC 4.05 (*)    HCT 38.3 (*)    All other components within normal limits  I-STAT CG4 LACTIC ACID, ED - Abnormal; Notable for the following components:   Lactic Acid, Venous 2.18 (*)    All other components within normal limits  CULTURE, BLOOD (ROUTINE X 2)  CULTURE, BLOOD (ROUTINE X 2)  URINALYSIS, ROUTINE W REFLEX MICROSCOPIC  VITAMIN B1  ETHANOL  I-STAT CG4 LACTIC ACID, ED    EKG  EKG Interpretation  Date/Time:  Friday February 26 2018 16:53:37 EDT Ventricular Rate:  103 PR Interval:    QRS Duration: 94 QT Interval:  366 QTC Calculation: 480 R Axis:   -9 Text Interpretation:  Sinus tachycardia Borderline prolonged QT interval T wave abnormality Abnormal ekg Confirmed by Carmin Muskrat [id number]) on 02/26/2018 5:01:06 PM       Radiology Dg Chest 2 View  Result Date: 02/26/2018 CLINICAL DATA:  Body ache and tremors x1 day. EXAM: CHEST - 2 VIEW COMPARISON:  None. FINDINGS: The heart size and mediastinal contours are within normal limits. Slightly low lung volumes with lordotic appearance of the frontal view.  Both lungs are clear. The visualized skeletal structures are unremarkable. IMPRESSION: No active cardiopulmonary disease. Electronically Signed   By: Ashley Royalty M.D.   On: 02/26/2018 18:03   Ct Head Wo Contrast  Result Date: 02/25/2018 CLINICAL DATA:  Altered mental status EXAM: CT HEAD WITHOUT CONTRAST TECHNIQUE: Contiguous axial images were obtained from the base of the skull through the vertex without intravenous contrast. COMPARISON:  None. FINDINGS: Brain: The ventricles are normal in size and configuration. There is mild to moderate frontal and parietal lobe atrophy bilaterally. There is no intracranial mass, hemorrhage, extra-axial fluid collection, or midline shift. There is rather minimal small vessel disease in the centra semiovale bilaterally.  Elsewhere gray-white compartments appear normal. No evident acute infarct. Vascular: No hyperdense vessel. There is no appreciable vascular calcification. Skull: Bony calvarium appears intact. Sinuses/Orbits: There is mucosal thickening in several ethmoid air cells bilaterally. There is mild mucosal thickening in the posterior right maxillary antrum. Other visualized paranasal sinuses are clear. Question cataract removal on the right. Orbits otherwise appear symmetric bilaterally. Other: Mastoid air cells are clear. IMPRESSION: Frontal and parietal lobe atrophy bilaterally. Minimal periventricular small vessel disease. No acute infarct. No mass or hemorrhage. Areas of paranasal sinus disease. Question cataract removal on the right. Electronically Signed   By: Lowella Grip III M.D.   On: 02/25/2018 17:14    Procedures Procedures (including critical care time)  Medications Ordered in ED Medications  LORazepam (ATIVAN) injection 0-4 mg (2 mg Intravenous Given 02/26/18 1724)    Or  LORazepam (ATIVAN) tablet 0-4 mg ( Oral See Alternative 02/26/18 1724)  LORazepam (ATIVAN) injection 0-4 mg (not administered)    Or  LORazepam (ATIVAN) tablet 0-4 mg  (not administered)  thiamine (VITAMIN B-1) tablet 100 mg ( Oral See Alternative 02/26/18 1721)    Or  thiamine (B-1) injection 100 mg (100 mg Intravenous Given 02/26/18 1721)     Initial Impression / Assessment and Plan / ED Course  I have reviewed the triage vital signs and the nursing notes.  Pertinent labs & imaging results that were available during my care of the patient were reviewed by me and considered in my medical decision making (see chart for details).   After the initial evaluation with concern for withdrawal versus infection versus other cause for his altered mental status, tremor, anxiousness, patient was started on the CIWA protocol, received IV fluids.   8:21 PM Now, after several doses of Ativan, the patient is much more calm. We discussed the patient's alcohol intake, and he states that he last had alcohol yesterday. Wife affirms that the patient has had episodes as severe as the nights, typically associated with break in alcohol consumption, we discussed the possibility of acute alcohol withdrawal, tremor. Given the substantial tremor, altered mental status, and need for ongoing titration of benzodiazepine, the patient will require admission for further evaluation and management.   Final Clinical Impressions(s) / ED Diagnoses  Acute alcohol withdrawal   Carmin Muskrat, MD 02/26/18 2023

## 2018-02-26 NOTE — ED Notes (Signed)
Pt given water, OK per MD.

## 2018-02-26 NOTE — ED Triage Notes (Addendum)
Per pt family member- pt sent by PCP for body aches, trouble walking, emesis, and tremors to arms. This happened several weeks ago too and they thought it was the flu and treated him with tamiflu. Now that it has happened again PCP sent him here and thinks it may be a Thiamine deficiency. Blood work was drawn yesterday at PCP. Pt wife also states he is struggling with depression. Pt agrees. Pt drinks bourbon daily. Last drink yesterday.

## 2018-02-27 ENCOUNTER — Observation Stay (HOSPITAL_COMMUNITY): Payer: Medicare Other

## 2018-02-27 DIAGNOSIS — K4090 Unilateral inguinal hernia, without obstruction or gangrene, not specified as recurrent: Secondary | ICD-10-CM | POA: Diagnosis not present

## 2018-02-27 DIAGNOSIS — R251 Tremor, unspecified: Secondary | ICD-10-CM | POA: Diagnosis not present

## 2018-02-27 DIAGNOSIS — F10239 Alcohol dependence with withdrawal, unspecified: Principal | ICD-10-CM

## 2018-02-27 LAB — RAPID URINE DRUG SCREEN, HOSP PERFORMED
Amphetamines: NOT DETECTED
BARBITURATES: NOT DETECTED
BENZODIAZEPINES: NOT DETECTED
COCAINE: NOT DETECTED
OPIATES: NOT DETECTED
Tetrahydrocannabinol: NOT DETECTED

## 2018-02-27 LAB — BASIC METABOLIC PANEL
Anion gap: 13 (ref 5–15)
BUN: 22 mg/dL — AB (ref 6–20)
CALCIUM: 9.4 mg/dL (ref 8.9–10.3)
CO2: 22 mmol/L (ref 22–32)
Chloride: 101 mmol/L (ref 101–111)
Creatinine, Ser: 1.14 mg/dL (ref 0.61–1.24)
GFR calc Af Amer: >60 mL/min (ref >60)
GLUCOSE: 122 mg/dL — AB (ref 65–99)
Potassium: 3.3 mmol/L — ABNORMAL LOW (ref 3.5–5.1)
Sodium: 136 mmol/L (ref 135–145)

## 2018-02-27 LAB — CBC
HCT: 36.7 % — ABNORMAL LOW (ref 39.0–52.0)
Hemoglobin: 12.2 g/dL — ABNORMAL LOW (ref 13.0–17.0)
MCH: 31.8 pg (ref 26.0–34.0)
MCHC: 33.2 g/dL (ref 30.0–36.0)
MCV: 95.6 fL (ref 78.0–100.0)
Platelets: 149 10*3/uL — ABNORMAL LOW (ref 150–400)
RBC: 3.84 MIL/uL — ABNORMAL LOW (ref 4.22–5.81)
RDW: 13.6 % (ref 11.5–15.5)
WBC: 4.5 10*3/uL (ref 4.0–10.5)

## 2018-02-27 LAB — HEPATIC FUNCTION PANEL
ALT: 30 U/L (ref 17–63)
AST: 47 U/L — AB (ref 15–41)
Albumin: 4 g/dL (ref 3.5–5.0)
Alkaline Phosphatase: 49 U/L (ref 38–126)
BILIRUBIN DIRECT: 0.3 mg/dL (ref 0.1–0.5)
BILIRUBIN TOTAL: 1.2 mg/dL (ref 0.3–1.2)
Indirect Bilirubin: 0.9 mg/dL (ref 0.3–0.9)
Total Protein: 6.9 g/dL (ref 6.5–8.1)

## 2018-02-27 LAB — VITAMIN B12: Vitamin B-12: 335 pg/mL (ref 180–914)

## 2018-02-27 LAB — MAGNESIUM: MAGNESIUM: 1.5 mg/dL — AB (ref 1.7–2.4)

## 2018-02-27 LAB — CREATININE, SERUM
Creatinine, Ser: 1.17 mg/dL (ref 0.61–1.24)
GFR calc Af Amer: >60 mL/min (ref >60)

## 2018-02-27 LAB — CK: Total CK: 87 U/L (ref 49–397)

## 2018-02-27 LAB — RPR: RPR: NONREACTIVE

## 2018-02-27 LAB — TSH: TSH: 4.465 u[IU]/mL (ref 0.350–4.500)

## 2018-02-27 LAB — AMMONIA: Ammonia: 25 umol/L (ref 9–35)

## 2018-02-27 LAB — TROPONIN I

## 2018-02-27 MED ORDER — LORAZEPAM 2 MG/ML IJ SOLN: 1.0000 mg | Freq: Four times a day (QID) | INTRAMUSCULAR | Status: DC

## 2018-02-27 MED ORDER — POTASSIUM CHLORIDE CRYS ER 20 MEQ PO TBCR
40.0000 meq | EXTENDED_RELEASE_TABLET | Freq: Once | ORAL | Status: AC
  Administered 2018-02-27: 40 meq via ORAL
  Filled 2018-02-27: qty 2

## 2018-02-27 MED ORDER — HALOPERIDOL LACTATE 5 MG/ML IJ SOLN
2.0000 mg | Freq: Four times a day (QID) | INTRAMUSCULAR | Status: DC | PRN
  Administered 2018-02-27 – 2018-02-28 (×2): 2 mg via INTRAVENOUS
  Filled 2018-02-27 (×2): qty 1

## 2018-02-27 MED ORDER — THIAMINE HCL 100 MG/ML IJ SOLN: 250.0000 mg | Freq: Every day | INTRAMUSCULAR | Status: DC

## 2018-02-27 MED ORDER — IOPAMIDOL (ISOVUE-300) INJECTION 61%
INTRAVENOUS | Status: AC
  Administered 2018-02-27: 100 mL
  Filled 2018-02-27: qty 100

## 2018-02-27 MED ORDER — HALOPERIDOL LACTATE 5 MG/ML IJ SOLN
2.0000 mg | Freq: Once | INTRAMUSCULAR | Status: AC
  Administered 2018-02-27: 2 mg via INTRAVENOUS

## 2018-02-27 MED ORDER — CHLORDIAZEPOXIDE HCL 5 MG PO CAPS: 25.0000 mg | ORAL_CAPSULE | ORAL | Status: DC

## 2018-02-27 MED ORDER — CHLORDIAZEPOXIDE HCL 5 MG PO CAPS: 25.0000 mg | ORAL_CAPSULE | Freq: Three times a day (TID) | ORAL | Status: DC

## 2018-02-27 MED ORDER — LORAZEPAM 2 MG/ML IJ SOLN
1.0000 mg | Freq: Four times a day (QID) | INTRAMUSCULAR | Status: DC
  Administered 2018-02-27 – 2018-02-28 (×3): 1 mg via INTRAVENOUS
  Filled 2018-02-27 (×3): qty 1

## 2018-02-27 MED ORDER — HALOPERIDOL LACTATE 5 MG/ML IJ SOLN
INTRAMUSCULAR | Status: AC
  Filled 2018-02-27: qty 1

## 2018-02-27 MED ORDER — CHLORDIAZEPOXIDE HCL 5 MG PO CAPS
25.0000 mg | ORAL_CAPSULE | Freq: Four times a day (QID) | ORAL | Status: DC
  Administered 2018-02-27 (×2): 25 mg via ORAL
  Filled 2018-02-27 (×2): qty 5

## 2018-02-27 MED ORDER — CHLORDIAZEPOXIDE HCL 5 MG PO CAPS: 25.0000 mg | ORAL_CAPSULE | Freq: Every day | ORAL | Status: DC

## 2018-02-27 MED ORDER — THIAMINE HCL 100 MG/ML IJ SOLN
500.0000 mg | Freq: Three times a day (TID) | INTRAVENOUS | Status: DC
  Administered 2018-02-27 – 2018-02-28 (×4): 500 mg via INTRAVENOUS
  Filled 2018-02-27 (×6): qty 5

## 2018-02-27 MED ORDER — MAGNESIUM SULFATE 4 GM/100ML IV SOLN
4.0000 g | Freq: Once | INTRAVENOUS | Status: AC
  Administered 2018-02-27: 4 g via INTRAVENOUS
  Filled 2018-02-27: qty 100

## 2018-02-27 NOTE — Progress Notes (Signed)
14:05 Pt is putting on clothes and attempting to leave throughout day. RN and staff have educated patient and spouse on safety such as importance of using call bell and not turning off bed alarm. He is verbally aggressive towards wife. MD notified, said to give IV ativan and hold librium.  14:30 Patient is requesting to leave and attempting to put on clothes. RN gave PRN ativan IV. No change in patient behaviour. MD notified, RN told to monitor and give librium.  15:00 patient is continuing to try to put on clothes and leave. RN has educated patient on need to stay.  15:30 RN was called by NT and told that the patient was threatening his spouse and was not verbally redirectable  15:50 security called due to pt becoming combative.  15:55 Security left unit.  1600 patient continues to be verbally aggressive and is stating that he is going to leave. MD notified, says he is not mentally competent to leave. MD to assess patient.  50 MD arrived, patient not redirectable verbally. Pt became verbally aggressive and attempted to get out of bed and take off telemetry. Security called.  90 MD gave verbal orders to give 2 mg haldol IV and 2 mg ativan IV due to emergency situation. Meds given.  1645 Pt still attempting to leave bed and is severely agitated. MD gave verbal order to give 2 mg haldol IV one time.  1700 pt is asleep. Security left the floor. MD to place IVC orders.

## 2018-02-27 NOTE — Progress Notes (Addendum)
Patient transported to MRI  Patient has consumed both bottles of contrast\

## 2018-02-27 NOTE — Progress Notes (Signed)
PROGRESS NOTE    Brandon Fleming  AVW:[id number] DOB: 1945/12/31 DOA: 02/26/2018 PCP: Prince Solian, MD   Brief Narrative:  Per HPI Brandon Fleming is Brandon Fleming 72 y.o. male with history of hypertension, hyperlipidemia has been having increasing weakness tremors and nausea vomiting and flulike symptoms over the last 6 weeks.  Patient states 6 weeks ago patient's primary care physician had treated for flu empirically.  Despite taking which patient was still having weakness and denies body aches with last few days having some nausea vomiting and poor appetite.  Admits to drinking alcohol every day.  Denies any abdominal pain.  Patient also has been noticing increasing tremors last few months.  Has had falls frequently.  Patient's primary care physician had CT head done yesterday which was unremarkable.  Assessment & Plan:   Active Problems:   Weakness   Essential hypertension   HLD (hyperlipidemia)   Tremors of nervous system   Alcohol abuse   Weakness generalized   1. Alcohol Withdrawal - Sx likely 2/2 etoh withdrawal.  Pt started feeling poorly around 3/13 and stopped drinking.  Last drink was 3/13.  His wife noticed things worsening then.  Noted to have tremors and tachycardia.  CIWA this morning 14. No hx complicated withdrawal.  Started drinking more over the past 6-7 months (wife thinks he's been more depressed since some injuries and being less active - unable to play golf, etc).  1. Continue CIWA protocol with prn ativan 2. Schedule ativan 1 mg q6 3. Haldol 2 mg q6 prn agitation (repeat ekg tomorrow AM given slightly prolonged QT)  4. Pt increasingly agitated this afternoon, wanting to leave.  Actively withdrawing and not safe for patient to be discharged and he did not demonstrate capacity to make decision to leave at this time and also was behaving aggressively towards his wife and staff.  IVC'd pt.  Given 4 haldol and 2 ativan.  Will consult psychiatry to see him tomorrow.     2. Increasing forgetfulness - Ammonia, B12, RPR within normal limits.  RPR is pending.  MRI motion degraded, but possibly advanced biparietal cerebral volume loss.  Wife notes increasing forgetfulness coincided with increased drinking for past 6 months.  Suspect this is most likely cause, will treat with high dose thiamine in case of wernickes.  Negative utox  3. Poor appetite with nausea vomiting - CT notable for steatosis, nonspecific perinephric fat stranding, but UA without WBC, nitrite or UA.   4. Hypertension on amlodipine and lisinopril which will be continued.  5. Depression on Cymbalta.  6. Hyperlipidemia on fenofibrate and statins  7. Proteinuria: f/u repeat UA outpatient  8. Elevated AST: mild, follow  DVT prophylaxis: lovenox Code Status: full  Family Communication: wife at bedside Disposition Plan: pending improvement in etoh withdrawal   Consultants:   none  Procedures:   none  Antimicrobials:   none    Subjective: Wants to go home. "I thought I had the flu 6 weeks ago"   Similar symptoms recently.  Feels ok today. Wife notes 6 months memory loss.  Sx became worse after he stopped drinking on Wed night.   Objective: Vitals:   02/26/18 2222 02/27/18 0400 02/27/18 0600 02/27/18 0823  BP: 133/78 (!) 157/85 (!) 155/80 134/81  Pulse: (!) 101 100 (!) 102 99  Resp: 20 18  18   Temp: (!) 97.4 F (36.3 C) 98.6 F (37 C)  99 F (37.2 C)  TempSrc: Oral Oral  Oral  SpO2: 99% 99%  99%  Weight: 72 kg (158 lb 11.7 oz)     Height: 5\' 6"  (1.676 m)      No intake or output data in the 24 hours ending 02/27/18 0923 Filed Weights   02/26/18 2222  Weight: 72 kg (158 lb 11.7 oz)    Examination:  General exam: Appears calm and comfortable, fidgeting  Respiratory system: Clear to auscultation. Respiratory effort normal. Cardiovascular system: S1 & S2 heard, RRR. No JVD, murmurs, rubs, gallops or clicks. No pedal edema. Gastrointestinal system: Abdomen is  nondistended, soft and nontender. No organomegaly or masses felt. Normal bowel sounds heard. Central nervous system: Alert and oriented x2 (didn't know month, knew president, had to be corrected when asking previous president).  CN 2-12 intact.  Tremor with FNF.   Extremities: Symmetric 5 x 5 power. Skin: No rashes, lesions or ulcers Psychiatry: Judgement and insight appear normal. Mood & affect appropriate.     Data Reviewed: I have personally reviewed following labs and imaging studies  CBC: Recent Labs  Lab 02/26/18 1704 02/26/18 2303 02/27/18 0411  WBC 5.6 4.4 4.5  NEUTROABS 4.2  --   --   HGB 13.2 12.8* 12.2*  HCT 38.3* 37.6* 36.7*  MCV 94.6 95.2 95.6  PLT 173 161 299*   Basic Metabolic Panel: Recent Labs  Lab 02/26/18 1704 02/26/18 2303 02/27/18 0411  NA 142  --  136  K 3.7  --  3.3*  CL 107  --  101  CO2 19*  --  22  GLUCOSE 157*  --  122*  BUN 24*  --  22*  CREATININE 1.19 1.17 1.14  CALCIUM 10.1  --  9.4   GFR: Estimated Creatinine Clearance: 54.4 mL/min (by C-G formula based on SCr of 1.14 mg/dL). Liver Function Tests: Recent Labs  Lab 02/26/18 1704 02/27/18 0411  AST 48* 47*  ALT 36 30  ALKPHOS 54 49  BILITOT 1.7* 1.2  PROT 7.4 6.9  ALBUMIN 4.4 4.0   No results for input(s): LIPASE, AMYLASE in the last 168 hours. Recent Labs  Lab 02/26/18 2303  AMMONIA 25   Coagulation Profile: No results for input(s): INR, PROTIME in the last 168 hours. Cardiac Enzymes: Recent Labs  Lab 02/26/18 2303  CKTOTAL 87  TROPONINI <0.03   BNP (last 3 results) No results for input(s): PROBNP in the last 8760 hours. HbA1C: No results for input(s): HGBA1C in the last 72 hours. CBG: No results for input(s): GLUCAP in the last 168 hours. Lipid Profile: No results for input(s): CHOL, HDL, LDLCALC, TRIG, CHOLHDL, LDLDIRECT in the last 72 hours. Thyroid Function Tests: Recent Labs    02/26/18 2303  TSH 4.465   Anemia Panel: Recent Labs    02/26/18 2303    VITAMINB12 335   Sepsis Labs: Recent Labs  Lab 02/26/18 1714  LATICACIDVEN 2.18*    No results found for this or any previous visit (from the past 240 hour(s)).       Radiology Studies: Dg Chest 2 View  Result Date: 02/26/2018 CLINICAL DATA:  Body ache and tremors x1 day. EXAM: CHEST - 2 VIEW COMPARISON:  None. FINDINGS: The heart size and mediastinal contours are within normal limits. Slightly low lung volumes with lordotic appearance of the frontal view. Both lungs are clear. The visualized skeletal structures are unremarkable. IMPRESSION: No active cardiopulmonary disease. Electronically Signed   By: Ashley Royalty M.D.   On: 02/26/2018 18:03   Ct Head Wo Contrast  Result Date: 02/25/2018 CLINICAL DATA:  Altered mental status EXAM: CT HEAD WITHOUT CONTRAST TECHNIQUE: Contiguous axial images were obtained from the base of the skull through the vertex without intravenous contrast. COMPARISON:  None. FINDINGS: Brain: The ventricles are normal in size and configuration. There is mild to moderate frontal and parietal lobe atrophy bilaterally. There is no intracranial mass, hemorrhage, extra-axial fluid collection, or midline shift. There is rather minimal small vessel disease in the centra semiovale bilaterally. Elsewhere gray-white compartments appear normal. No evident acute infarct. Vascular: No hyperdense vessel. There is no appreciable vascular calcification. Skull: Bony calvarium appears intact. Sinuses/Orbits: There is mucosal thickening in several ethmoid air cells bilaterally. There is mild mucosal thickening in the posterior right maxillary antrum. Other visualized paranasal sinuses are clear. Question cataract removal on the right. Orbits otherwise appear symmetric bilaterally. Other: Mastoid air cells are clear. IMPRESSION: Frontal and parietal lobe atrophy bilaterally. Minimal periventricular small vessel disease. No acute infarct. No mass or hemorrhage. Areas of paranasal sinus  disease. Question cataract removal on the right. Electronically Signed   By: Lowella Grip III M.D.   On: 02/25/2018 17:14        Scheduled Meds: . amLODipine  5 mg Oral Daily  . aspirin EC  81 mg Oral Daily  . DULoxetine  60 mg Oral Daily  . enoxaparin (LOVENOX) injection  40 mg Subcutaneous Q24H  . fenofibrate  160 mg Oral Daily  . folic acid  1 mg Oral Daily  . lisinopril  20 mg Oral Daily  . LORazepam  0-4 mg Oral Q6H   Followed by  . [START ON 03/01/2018] LORazepam  0-4 mg Oral Q12H  . multivitamin with minerals  1 tablet Oral Daily  . pantoprazole  40 mg Oral Daily  . potassium chloride  40 mEq Oral Once  . rosuvastatin  10 mg Oral Daily  . thiamine  100 mg Oral Daily   Or  . thiamine  100 mg Intravenous Daily   Continuous Infusions:   LOS: 0 days    Time spent: over 30 min    Fayrene Helper, MD [facility] Pager [phone]  If 7PM-7AM, please contact night-coverage [email or web address] Password [facility] 02/27/2018, 9:23 AM

## 2018-02-27 NOTE — Progress Notes (Signed)
CSW contacted by MD that patient is threatening to leave AMA. Patient is actively withdrawing at this time, will be a danger to himself and/or others if he left the hospital right now. CSW completed IVC paperwork and faxed to Magistrate's office. CSW called Magistrate's office to confirm receipt and request that patient be served at [facility] in room 3W10.  Laveda Abbe, [place] Clinical Social Worker [phone]

## 2018-02-27 NOTE — Progress Notes (Signed)
PT Cancellation Note  Patient Details Name: Brandon Fleming MRN: [id number] DOB: 01/09/1946   Cancelled Treatment:    Reason Eval/Treat Not Completed: Other (comment).  Pt was in bed and calm but wife asked PT not to see pt due to wanting him to be settled down.  Will try again tomorrow as pt allows.   Ramond Dial 02/27/2018, 3:15 PM   Mee Hives, PT MS Acute Rehab Dept. Number: [place]

## 2018-02-27 NOTE — Progress Notes (Signed)
Pt is agitated and attempting to leave. MD notified and assessed pt. Orders for safety sitter placed. Not redirectable verbally.

## 2018-02-27 NOTE — Progress Notes (Signed)
Pt wife gave home meds. Pt wife informed RN during AM med pass. meds given were in daily med pack with no labels. RN delivered meds to pharmacy for identification. Meds held per pharmacy instructions.

## 2018-02-28 DIAGNOSIS — F1010 Alcohol abuse, uncomplicated: Secondary | ICD-10-CM

## 2018-02-28 DIAGNOSIS — Z79899 Other long term (current) drug therapy: Secondary | ICD-10-CM | POA: Diagnosis not present

## 2018-02-28 DIAGNOSIS — R531 Weakness: Secondary | ICD-10-CM | POA: Diagnosis present

## 2018-02-28 DIAGNOSIS — F10239 Alcohol dependence with withdrawal, unspecified: Secondary | ICD-10-CM | POA: Diagnosis present

## 2018-02-28 DIAGNOSIS — F329 Major depressive disorder, single episode, unspecified: Secondary | ICD-10-CM | POA: Diagnosis present

## 2018-02-28 DIAGNOSIS — I10 Essential (primary) hypertension: Secondary | ICD-10-CM | POA: Diagnosis present

## 2018-02-28 DIAGNOSIS — R112 Nausea with vomiting, unspecified: Secondary | ICD-10-CM | POA: Diagnosis present

## 2018-02-28 DIAGNOSIS — R740 Nonspecific elevation of levels of transaminase and lactic acid dehydrogenase [LDH]: Secondary | ICD-10-CM | POA: Diagnosis present

## 2018-02-28 DIAGNOSIS — E872 Acidosis: Secondary | ICD-10-CM | POA: Diagnosis not present

## 2018-02-28 DIAGNOSIS — Z87891 Personal history of nicotine dependence: Secondary | ICD-10-CM

## 2018-02-28 DIAGNOSIS — R296 Repeated falls: Secondary | ICD-10-CM | POA: Diagnosis present

## 2018-02-28 DIAGNOSIS — F10939 Alcohol use, unspecified with withdrawal, unspecified: Secondary | ICD-10-CM | POA: Diagnosis present

## 2018-02-28 DIAGNOSIS — D696 Thrombocytopenia, unspecified: Secondary | ICD-10-CM | POA: Diagnosis present

## 2018-02-28 DIAGNOSIS — E785 Hyperlipidemia, unspecified: Secondary | ICD-10-CM | POA: Diagnosis present

## 2018-02-28 DIAGNOSIS — R809 Proteinuria, unspecified: Secondary | ICD-10-CM | POA: Diagnosis present

## 2018-02-28 DIAGNOSIS — R251 Tremor, unspecified: Secondary | ICD-10-CM | POA: Diagnosis present

## 2018-02-28 DIAGNOSIS — R451 Restlessness and agitation: Secondary | ICD-10-CM | POA: Diagnosis present

## 2018-02-28 LAB — CBC
HCT: 36 % — ABNORMAL LOW (ref 39.0–52.0)
Hemoglobin: 12.2 g/dL — ABNORMAL LOW (ref 13.0–17.0)
MCH: 32 pg (ref 26.0–34.0)
MCHC: 33.9 g/dL (ref 30.0–36.0)
MCV: 94.5 fL (ref 78.0–100.0)
PLATELETS: 140 10*3/uL — AB (ref 150–400)
RBC: 3.81 MIL/uL — ABNORMAL LOW (ref 4.22–5.81)
RDW: 13 % (ref 11.5–15.5)
WBC: 5.7 10*3/uL (ref 4.0–10.5)

## 2018-02-28 LAB — COMPREHENSIVE METABOLIC PANEL
ALBUMIN: 3.7 g/dL (ref 3.5–5.0)
ALT: 30 U/L (ref 17–63)
AST: 56 U/L — AB (ref 15–41)
Alkaline Phosphatase: 48 U/L (ref 38–126)
Anion gap: 9 (ref 5–15)
BILIRUBIN TOTAL: 1.3 mg/dL — AB (ref 0.3–1.2)
BUN: 20 mg/dL (ref 6–20)
CHLORIDE: 105 mmol/L (ref 101–111)
CO2: 20 mmol/L — AB (ref 22–32)
Calcium: 9.3 mg/dL (ref 8.9–10.3)
Creatinine, Ser: 1.15 mg/dL (ref 0.61–1.24)
GFR calc Af Amer: >60 mL/min (ref >60)
GFR calc non Af Amer: >60 mL/min (ref >60)
GLUCOSE: 114 mg/dL — AB (ref 65–99)
Potassium: 4.1 mmol/L (ref 3.5–5.1)
SODIUM: 134 mmol/L — AB (ref 135–145)
TOTAL PROTEIN: 6.7 g/dL (ref 6.5–8.1)

## 2018-02-28 LAB — VITAMIN B1: VITAMIN B1 (THIAMINE): 125.3 nmol/L (ref 66.5–200.0)

## 2018-02-28 LAB — MAGNESIUM: Magnesium: 2.9 mg/dL — ABNORMAL HIGH (ref 1.7–2.4)

## 2018-02-28 MED ORDER — CHLORDIAZEPOXIDE HCL 5 MG PO CAPS
25.0000 mg | ORAL_CAPSULE | Freq: Four times a day (QID) | ORAL | Status: DC
  Administered 2018-02-28: 25 mg via ORAL
  Filled 2018-02-28: qty 5

## 2018-02-28 MED ORDER — CHLORDIAZEPOXIDE HCL 5 MG PO CAPS: 25.0000 mg | ORAL_CAPSULE | ORAL | Status: DC

## 2018-02-28 MED ORDER — CHLORDIAZEPOXIDE HCL 5 MG PO CAPS: 25.0000 mg | ORAL_CAPSULE | Freq: Every day | ORAL | Status: DC

## 2018-02-28 MED ORDER — CHLORDIAZEPOXIDE HCL 25 MG PO CAPS: ORAL_CAPSULE | ORAL | 0 refills | Status: AC

## 2018-02-28 MED ORDER — FOLIC ACID 1 MG PO TABS: 1.0000 mg | ORAL_TABLET | Freq: Every day | ORAL | 0 refills | Status: AC

## 2018-02-28 MED ORDER — CHLORDIAZEPOXIDE HCL 5 MG PO CAPS: 25.0000 mg | ORAL_CAPSULE | Freq: Three times a day (TID) | ORAL | Status: DC

## 2018-02-28 MED ORDER — VITAMIN B-1 100 MG PO TABS: 100.0000 mg | ORAL_TABLET | Freq: Every day | ORAL | 0 refills | Status: AC

## 2018-02-28 NOTE — Consult Note (Signed)
[facility] Consult   Reason for Consult:  capacity Referring Physician:  Dr. Florene Glen Patient Identification: Brandon Fleming MRN:  [id number] Principal Diagnosis: Alcohol abuse Diagnosis:   Patient Active Problem List   Diagnosis Date Noted  . Weakness [R53.1] 02/26/2018  . Essential hypertension [I10] 02/26/2018  . HLD (hyperlipidemia) [E78.5] 02/26/2018  . Tremors of nervous system [R25.1] 02/26/2018  . Alcohol abuse [F10.10] 02/26/2018  . Weakness generalized [R53.1] 02/26/2018    Total Time spent with patient: 45 minutes  Subjective:   Brandon Fleming is a 72 y.o. male patient admitted due to flue like symptoms.  HPI:  Brandon Fleming a 72 y.o.malewho denies prior history of mental illness but admits to long history of alcohol use disorder dating back to his college days. He also reports history of hypertension, hyperlipidemia for which he receives medications. He states that he was brought to the ED due to flu-like symptoms characterized by general body weakness,  nausea and vomiting. He was interviewed in the presence of his wife of many years. Patient reports that he drinks liquor at least 6-7 times a week for many years and has never needed alcohol rehab and he is not interested in getting any right now. However, he agreed to cut down on drinking. He states that he last consumed alcohol 5 days ago and no longer endorsing any symptoms of withdrawal. Patient is alert, oriented to time, place, person and situation. He acknowledged the bad effect of drinking alcohol on him and appreciate the need to cut down. He denies SI/HI, delusional thinking, depression, anxiety or psychosis.  Past Psychiatric History: denies  Risk to Self: Is patient at risk for suicide?: No Risk to Others:   Prior Inpatient Therapy:   Prior Outpatient Therapy:    Past Medical History:  Past Medical History:  Diagnosis Date  . Hyperlipidemia   . Hypertension   . Kidney damage      Past Surgical History:  Procedure Laterality Date  . KNEE SURGERY     Family History:  Family History  Problem Relation Age of Onset  . CAD Father   . Anesthesia problems Neg Hx   . Broken bones Neg Hx   . Cancer Neg Hx   . Clotting disorder Neg Hx   . Collagen disease Neg Hx   . Diabetes Neg Hx   . Dislocations Neg Hx   . Osteoporosis Neg Hx   . Rheumatologic disease Neg Hx   . Scoliosis Neg Hx   . Severe sprains Neg Hx    Family Psychiatric  History:  Social History:  Social History   Substance and Sexual Activity  Alcohol Use Yes     Social History   Substance and Sexual Activity  Drug Use No    Social History   Socioeconomic History  . Marital status: Married    Spouse name: None  . Number of children: None  . Years of education: None  . Highest education level: None  Social Needs  . Financial resource strain: None  . Food insecurity - worry: None  . Food insecurity - inability: None  . Transportation needs - medical: None  . Transportation needs - non-medical: None  Occupational History  . None  Tobacco Use  . Smoking status: Former Research scientist (life sciences)  . Smokeless tobacco: Never Used  Substance and Sexual Activity  . Alcohol use: Yes  . Drug use: No  . Sexual activity: None  Other Topics Concern  . None  Social  History Narrative  . None   Additional Social History:    Allergies:  No Known Allergies  Labs:  Results for orders placed or performed during the hospital encounter of 02/26/18 (from the past 48 hour(s))  Blood Culture (routine x 2)     Status: None (Preliminary result)   Collection Time: 02/26/18  4:50 PM  Result Value Ref Range   Specimen Description BLOOD RIGHT FOREARM    Special Requests      BOTTLES DRAWN AEROBIC AND ANAEROBIC Blood Culture results may not be optimal due to an excessive volume of blood received in culture bottles   Culture      NO GROWTH 2 DAYS Performed at [facility] [address] [place], [place]  [address]    Report Status PENDING   Ethanol     Status: None   Collection Time: 02/26/18  4:50 PM  Result Value Ref Range   Alcohol, Ethyl (B) <10 <10 mg/dL    Comment:        LOWEST DETECTABLE LIMIT FOR SERUM ALCOHOL IS 10 mg/dL FOR MEDICAL PURPOSES ONLY Performed at [facility], [place] [address], [place], [place] [address]   Blood Culture (routine x 2)     Status: None (Preliminary result)   Collection Time: 02/26/18  5:00 PM  Result Value Ref Range   Specimen Description BLOOD LEFT FOREARM    Special Requests      BOTTLES DRAWN AEROBIC AND ANAEROBIC Blood Culture adequate volume   Culture      NO GROWTH 2 DAYS Performed at [facility], [place] [address], [place], [place] [address]    Report Status PENDING   Comprehensive metabolic panel     Status: Abnormal   Collection Time: 02/26/18  5:04 PM  Result Value Ref Range   Sodium 142 135 - 145 mmol/L   Potassium 3.7 3.5 - 5.1 mmol/L   Chloride 107 101 - 111 mmol/L   CO2 19 (L) 22 - 32 mmol/L   Glucose, Bld 157 (H) 65 - 99 mg/dL   BUN 24 (H) 6 - 20 mg/dL   Creatinine, Ser 1.19 0.61 - 1.24 mg/dL   Calcium 10.1 8.9 - 10.3 mg/dL   Total Protein 7.4 6.5 - 8.1 g/dL   Albumin 4.4 3.5 - 5.0 g/dL   AST 48 (H) 15 - 41 U/L   ALT 36 17 - 63 U/L   Alkaline Phosphatase 54 38 - 126 U/L   Total Bilirubin 1.7 (H) 0.3 - 1.2 mg/dL   GFR calc non Af Amer >60 >60 mL/min   GFR calc Af Amer >60 >60 mL/min    Comment: (NOTE) The eGFR has been calculated using the CKD EPI equation. This calculation has not been validated in all clinical situations. eGFR's persistently <60 mL/min signify possible Chronic Kidney Disease.    Anion gap 16 (H) 5 - 15    Comment: Performed at [facility], [place] [address] [place], [place] [address]  CBC WITH DIFFERENTIAL     Status: Abnormal   Collection Time: 02/26/18  5:04 PM  Result Value Ref Range   WBC 5.6 4.0 - 10.5 K/uL   RBC 4.05 (L) 4.22 - 5.81 MIL/uL   Hemoglobin 13.2 13.0 - 17.0 g/dL    HCT 38.3 (L) 39.0 - 52.0 %   MCV 94.6 78.0 - 100.0 fL   MCH 32.6 26.0 - 34.0 pg   MCHC 34.5 30.0 - 36.0 g/dL   RDW  13.6 11.5 - 15.5 %   Platelets 173 150 - 400 K/uL   Neutrophils Relative % 75 %   Neutro Abs 4.2 1.7 - 7.7 K/uL   Lymphocytes Relative 14 %   Lymphs Abs 0.8 0.7 - 4.0 K/uL   Monocytes Relative 11 %   Monocytes Absolute 0.6 0.1 - 1.0 K/uL   Eosinophils Relative 0 %   Eosinophils Absolute 0.0 0.0 - 0.7 K/uL   Basophils Relative 0 %   Basophils Absolute 0.0 0.0 - 0.1 K/uL    Comment: Performed at [facility] [address], [place], [place] [address]  I-Stat CG4 Lactic Acid, ED  (not at  [facility])     Status: Abnormal   Collection Time: 02/26/18  5:14 PM  Result Value Ref Range   Lactic Acid, Venous 2.18 (HH) 0.5 - 1.9 mmol/L   Comment NOTIFIED PHYSICIAN   Urinalysis, Routine w reflex microscopic     Status: Abnormal   Collection Time: 02/26/18  5:31 PM  Result Value Ref Range   Color, Urine AMBER (A) YELLOW    Comment: BIOCHEMICALS MAY BE AFFECTED BY COLOR   APPearance CLEAR CLEAR   Specific Gravity, Urine 1.023 1.005 - 1.030   pH 6.0 5.0 - 8.0   Glucose, UA NEGATIVE NEGATIVE mg/dL   Hgb urine dipstick NEGATIVE NEGATIVE   Bilirubin Urine NEGATIVE NEGATIVE   Ketones, ur 5 (A) NEGATIVE mg/dL   Protein, ur 100 (A) NEGATIVE mg/dL   Nitrite NEGATIVE NEGATIVE   Leukocytes, UA NEGATIVE NEGATIVE   RBC / HPF 0-5 0 - 5 RBC/hpf   WBC, UA 0-5 0 - 5 WBC/hpf   Bacteria, UA NONE SEEN NONE SEEN   Squamous Epithelial / LPF 0-5 (A) NONE SEEN   Mucus PRESENT    Hyaline Casts, UA PRESENT     Comment: Performed at [facility], [address] [place], [place] [address]  CBC     Status: Abnormal   Collection Time: 02/26/18 11:03 PM  Result Value Ref Range   WBC 4.4 4.0 - 10.5 K/uL   RBC 3.95 (L) 4.22 - 5.81 MIL/uL   Hemoglobin 12.8 (L) 13.0 - 17.0 g/dL   HCT 37.6 (L) 39.0 - 52.0 %   MCV 95.2 78.0 - 100.0 fL   MCH 32.4 26.0 - 34.0 pg   MCHC 34.0 30.0 - 36.0 g/dL    RDW 13.4 11.5 - 15.5 %   Platelets 161 150 - 400 K/uL    Comment: Performed at [facility], [place] [address], [place], [place] [address]  Creatinine, serum     Status: None   Collection Time: 02/26/18 11:03 PM  Result Value Ref Range   Creatinine, Ser 1.17 0.61 - 1.24 mg/dL   GFR calc non Af Amer >60 >60 mL/min   GFR calc Af Amer >60 >60 mL/min    Comment: (NOTE) The eGFR has been calculated using the CKD EPI equation. This calculation has not been validated in all clinical situations. eGFR's persistently <60 mL/min signify possible Chronic Kidney Disease. Performed at [facility], [place] [address], [place], [place] [address]   Ammonia     Status: None   Collection Time: 02/26/18 11:03 PM  Result Value Ref Range   Ammonia 25 9 - 35 umol/L    Comment: Performed at [facility], [place] [address], [place], [place] [address]  Troponin I     Status: None   Collection Time: 02/26/18 11:03 PM  Result Value Ref Range   Troponin I <0.03 <0.03 ng/mL    Comment: Performed at [facility], [place] [address] [place], [place] [address]  TSH     Status: None   Collection Time: 02/26/18 11:03 PM  Result Value Ref Range   TSH 4.465 0.350 - 4.500 uIU/mL    Comment: Performed by a 3rd Generation assay with a functional sensitivity of <=0.01 uIU/mL. Performed at [facility], [place] [address], [place], [place] [address]   CK     Status: None   Collection Time: 02/26/18 11:03 PM  Result Value Ref Range   Total CK 87 49 - 397 U/L    Comment: Performed at [facility], [place] [address] [place], [place] [address]  Vitamin B12     Status: None   Collection Time: 02/26/18 11:03 PM  Result Value Ref Range   Vitamin B-12 335 180 - 914 pg/mL    Comment: (NOTE) This assay is not validated for testing neonatal or myeloproliferative syndrome specimens for Vitamin B12 levels. Performed at [facility], [place] [address], [place], [place] [address]   RPR     Status: None    Collection Time: 02/26/18 11:03 PM  Result Value Ref Range   RPR Ser Ql Non Reactive Non Reactive    Comment: (NOTE) Performed At: [facility] [place], [place] [id number] Rush Farmer MD [id number] Performed at [facility], [place] [address] [place], [place] [address]   Basic metabolic panel     Status: Abnormal   Collection Time: 02/27/18  4:11 AM  Result Value Ref Range   Sodium 136 135 - 145 mmol/L   Potassium 3.3 (L) 3.5 - 5.1 mmol/L   Chloride 101 101 - 111 mmol/L   CO2 22 22 - 32 mmol/L   Glucose, Bld 122 (H) 65 - 99 mg/dL   BUN 22 (H) 6 - 20 mg/dL   Creatinine, Ser 1.14 0.61 - 1.24 mg/dL   Calcium 9.4 8.9 - 10.3 mg/dL   GFR calc non Af Amer >60 >60 mL/min   GFR calc Af Amer >60 >60 mL/min    Comment: (NOTE) The eGFR has been calculated using the CKD EPI equation. This calculation has not been validated in all clinical situations. eGFR's persistently <60 mL/min signify possible Chronic Kidney Disease.    Anion gap 13 5 - 15    Comment: Performed at [facility] [address], [place], [place] [address]  CBC     Status: Abnormal   Collection Time: 02/27/18  4:11 AM  Result Value Ref Range   WBC 4.5 4.0 - 10.5 K/uL   RBC 3.84 (L) 4.22 - 5.81 MIL/uL   Hemoglobin 12.2 (L) 13.0 - 17.0 g/dL   HCT 36.7 (L) 39.0 - 52.0 %   MCV 95.6 78.0 - 100.0 fL   MCH 31.8 26.0 - 34.0 pg   MCHC 33.2 30.0 - 36.0 g/dL   RDW 13.6 11.5 - 15.5 %   Platelets 149 (L) 150 - 400 K/uL    Comment: Performed at [facility], [place] [address] [place], [place] [address]  Hepatic function panel     Status: Abnormal   Collection Time: 02/27/18  4:11 AM  Result Value Ref Range   Total Protein 6.9 6.5 - 8.1 g/dL   Albumin 4.0 3.5 - 5.0 g/dL   AST 47 (H) 15 - 41 U/L   ALT 30  17 - 63 U/L   Alkaline Phosphatase 49 38 - 126 U/L   Total Bilirubin 1.2 0.3 - 1.2 mg/dL   Bilirubin, Direct 0.3 0.1 - 0.5 mg/dL   Indirect Bilirubin 0.9 0.3 - 0.9 mg/dL     Comment: Performed at [facility] [address], [place], [place] [address]  Urine rapid drug screen (hosp performed)     Status: None   Collection Time: 02/27/18  9:05 AM  Result Value Ref Range   Opiates NONE DETECTED NONE DETECTED   Cocaine NONE DETECTED NONE DETECTED   Benzodiazepines NONE DETECTED NONE DETECTED   Amphetamines NONE DETECTED NONE DETECTED   Tetrahydrocannabinol NONE DETECTED NONE DETECTED   Barbiturates NONE DETECTED NONE DETECTED    Comment: (NOTE) DRUG SCREEN FOR MEDICAL PURPOSES ONLY.  IF CONFIRMATION IS NEEDED FOR ANY PURPOSE, NOTIFY LAB WITHIN 5 DAYS. LOWEST DETECTABLE LIMITS FOR URINE DRUG SCREEN Drug Class                     Cutoff (ng/mL) Amphetamine and metabolites    1000 Barbiturate and metabolites    200 Benzodiazepine                 086 Tricyclics and metabolites     300 Opiates and metabolites        300 Cocaine and metabolites        300 THC                            50 Performed at [facility], [place] [address], [place], [place] [address]   Magnesium     Status: Abnormal   Collection Time: 02/27/18  9:14 AM  Result Value Ref Range   Magnesium 1.5 (L) 1.7 - 2.4 mg/dL    Comment: Performed at [facility] [address], [place], [place] [address]  CBC     Status: Abnormal   Collection Time: 02/28/18  7:03 AM  Result Value Ref Range   WBC 5.7 4.0 - 10.5 K/uL   RBC 3.81 (L) 4.22 - 5.81 MIL/uL   Hemoglobin 12.2 (L) 13.0 - 17.0 g/dL   HCT 36.0 (L) 39.0 - 52.0 %   MCV 94.5 78.0 - 100.0 fL   MCH 32.0 26.0 - 34.0 pg   MCHC 33.9 30.0 - 36.0 g/dL   RDW 13.0 11.5 - 15.5 %   Platelets 140 (L) 150 - 400 K/uL    Comment: Performed at [facility], [place] [address], [place], [place] [address]  Magnesium     Status: Abnormal   Collection Time: 02/28/18  7:03 AM  Result Value Ref Range   Magnesium 2.9 (H) 1.7 - 2.4 mg/dL    Comment: Performed at [facility] [address] [place], [place] [address]   Comprehensive metabolic panel     Status: Abnormal   Collection Time: 02/28/18  7:03 AM  Result Value Ref Range   Sodium 134 (L) 135 - 145 mmol/L   Potassium 4.1 3.5 - 5.1 mmol/L   Chloride 105 101 - 111 mmol/L   CO2 20 (L) 22 - 32 mmol/L   Glucose, Bld 114 (H) 65 - 99 mg/dL   BUN 20 6 - 20 mg/dL   Creatinine, Ser 1.15 0.61 - 1.24 mg/dL   Calcium 9.3 8.9 - 10.3 mg/dL   Total Protein 6.7 6.5 - 8.1 g/dL   Albumin 3.7  3.5 - 5.0 g/dL   AST 56 (H) 15 - 41 U/L   ALT 30 17 - 63 U/L   Alkaline Phosphatase 48 38 - 126 U/L   Total Bilirubin 1.3 (H) 0.3 - 1.2 mg/dL   GFR calc non Af Amer >60 >60 mL/min   GFR calc Af Amer >60 >60 mL/min    Comment: (NOTE) The eGFR has been calculated using the CKD EPI equation. This calculation has not been validated in all clinical situations. eGFR's persistently <60 mL/min signify possible Chronic Kidney Disease.    Anion gap 9 5 - 15    Comment: Performed at [facility] [address], [place], [place] [address]    Current Facility-Administered Medications  Medication Dose Route Frequency Provider Last Rate Last Dose  . acetaminophen (TYLENOL) tablet 650 mg  650 mg Oral Q6H PRN Rise Patience, MD       Or  . acetaminophen (TYLENOL) suppository 650 mg  650 mg Rectal Q6H PRN Rise Patience, MD      . amLODipine (NORVASC) tablet 5 mg  5 mg Oral Daily Elodia Florence., MD   5 mg at 02/28/18 0913  . aspirin EC tablet 81 mg  81 mg Oral Daily Elodia Florence., MD   81 mg at 02/28/18 0913  . chlordiazePOXIDE (LIBRIUM) capsule 25 mg  25 mg Oral QID Elodia Florence., MD   25 mg at 02/28/18 1339   Followed by  . [START ON 03/01/2018] chlordiazePOXIDE (LIBRIUM) capsule 25 mg  25 mg Oral TID Elodia Florence., MD       Followed by  . [START ON 03/02/2018] chlordiazePOXIDE (LIBRIUM) capsule 25 mg  25 mg Oral BH-qamhs Elodia Florence., MD       Followed by  . [START ON 03/03/2018] chlordiazePOXIDE (LIBRIUM) capsule 25  mg  25 mg Oral Daily Elodia Florence., MD      . DULoxetine (CYMBALTA) DR capsule 60 mg  60 mg Oral Daily Elodia Florence., MD   60 mg at 02/28/18 0913  . enoxaparin (LOVENOX) injection 40 mg  40 mg Subcutaneous Q24H Rise Patience, MD   40 mg at 02/28/18 0912  . fenofibrate tablet 160 mg  160 mg Oral Daily Elodia Florence., MD   160 mg at 02/28/18 0913  . folic acid (FOLVITE) tablet 1 mg  1 mg Oral Daily Rise Patience, MD   1 mg at 02/28/18 0912  . haloperidol lactate (HALDOL) injection 2 mg  2 mg Intravenous Q6H PRN Elodia Florence., MD   2 mg at 02/28/18 0052  . lisinopril (PRINIVIL,ZESTRIL) tablet 20 mg  20 mg Oral Daily Elodia Florence., MD   20 mg at 02/28/18 0912  . LORazepam (ATIVAN) tablet 1 mg  1 mg Oral Q6H PRN Rise Patience, MD   1 mg at 02/27/18 6073   Or  . LORazepam (ATIVAN) injection 1 mg  1 mg Intravenous Q6H PRN Rise Patience, MD   1 mg at 02/28/18 0047  . LORazepam (ATIVAN) tablet 0-4 mg  0-4 mg Oral Q6H Rise Patience, MD   2 mg at 02/28/18 1338   Followed by  . [START ON 03/01/2018] LORazepam (ATIVAN) tablet 0-4 mg  0-4 mg Oral Q12H Rise Patience, MD      . multivitamin with minerals tablet 1 tablet  1 tablet Oral Daily Elodia Florence.,  MD   1 tablet at 02/28/18 0912  . ondansetron (ZOFRAN) tablet 4 mg  4 mg Oral Q6H PRN Rise Patience, MD       Or  . ondansetron [facility]) injection 4 mg  4 mg Intravenous Q6H PRN Rise Patience, MD      . pantoprazole (PROTONIX) EC tablet 40 mg  40 mg Oral Daily Elodia Florence., MD   40 mg at 02/28/18 0913  . rosuvastatin (CRESTOR) tablet 10 mg  10 mg Oral Daily Elodia Florence., MD   10 mg at 02/28/18 0912  . [START ON 03/01/2018] thiamine (B-1) injection 250 mg  250 mg Intravenous Daily Elodia Florence., MD      . thiamine 567m in normal saline (56m IVPB  500 mg Intravenous TID PoElodia Florence MD 0 mL/hr at 02/27/18 2322 500  mg at 02/28/18 [id number]  Musculoskeletal: Strength & Muscle Tone: within normal limits Gait & Station: unsteady Patient leans: Front  Psychiatric Specialty Exam: Physical Exam  Psychiatric: His speech is normal and behavior is normal. Judgment and thought content normal. His affect is angry. Cognition and memory are normal.    Review of Systems  Constitutional: Negative.   HENT: Negative.   Eyes: Negative.   Gastrointestinal: Negative.   Genitourinary: Negative.   Musculoskeletal: Negative.   Skin: Negative.   Endo/Heme/Allergies: Negative.   Psychiatric/Behavioral: Positive for substance abuse.    Blood pressure 126/73, pulse 94, temperature (!) 97.5 F (36.4 C), temperature source Oral, resp. rate 20, height 5' 6"  (1.676 m), weight 72 kg (158 lb 11.7 oz), SpO2 97 %.Body mass index is 25.62 kg/m.  General Appearance: Casual  Eye Contact:  Good  Speech:  Clear and Coherent  Volume:  Normal  Mood:  Angry  Affect:  Appropriate and Congruent  Thought Process:  Coherent and Descriptions of Associations: Intact  Orientation:  Full (Time, Place, and Person)  Thought Content:  Logical  Suicidal Thoughts:  No  Homicidal Thoughts:  No  Memory:  Immediate;   Good Recent;   Good Remote;   Good  Judgement:  Intact  Insight:  Fair  Psychomotor Activity:  Increased  Concentration:  Concentration: Fair and Attention Span: Fair  Recall:  Good  Fund of Knowledge:  Fair  Language:  Good  Akathisia:  No  Handed:  Right  AIMS (if indicated):     Assets:  Communication Skills Social Support  ADL's:  Intact  Cognition:  WNL  Sleep:   fair     Treatment Plan Summary: [age]ear old man with long history of alcohol use disorder who last consumed alcohol on 02/24/18. He currently denies symptoms except being angry at his wife for bringing him to the hospital and talking about his alcoholism instead of the flu-like symptoms he presented with. Patient has decision to make capacity based on  my evaluation today.  Pan/recommendations: Consider referring to to outpatient substance abuse counseling/AA meeting upon discharge.  Disposition: No evidence of imminent risk to self or others at present.   Supportive therapy provided about ongoing stressors. Unit social worker wo assit with referral patient to substance abuse treatment outpatient facility when patient is medically discharged.  AkCorena PilgrimMD 02/28/2018 3:20 PM

## 2018-02-28 NOTE — Progress Notes (Signed)
Patient and wife educated on discharge and follow up instructions. Wife able to verbalize instructions and teach back. Discharge summary provided and signed. No new questions or concerns.   IV and telemetry removed. Patient discharged from unit per staff in wheelchair. Wife to take home.

## 2018-02-28 NOTE — Evaluation (Signed)
Physical Therapy Evaluation Patient Details Name: Brandon Fleming MRN: [id number] DOB: [birth year] Today's Date: 02/28/2018   History of Present Illness  Pt is a 72 yo male admitted through ED on 02/26/18 with nausea and vomitting and flu-like symptoms. Pt was diagnosed with alcohol abuse in current withdrawl and proteinuria with depression. PMH significant for HTN, HLD, tremors and alcohol abuse.   Clinical Impression  Pt presents with the above diagnosis and below deficits for PT evaluation. Prior to admission, pt lived with his wife in a single level home. Pt was very active in the past and independent with all ADLs and IADLs. Pt was an active golfer and walker. Currently, pt requires supervision to min guard for safety with transfers and gait which may be related to ETOH abuse and withdrawal. PT will continue to follow-up and recommend home health at discharge in order to assist with a return to PLOF.     Follow Up Recommendations Home health PT;Supervision for mobility/OOB    Equipment Recommendations  None recommended by PT    Recommendations for Other Services       Precautions / Restrictions Precautions Precautions: Fall Restrictions Weight Bearing Restrictions: No      Mobility  Bed Mobility Overal bed mobility: Modified Independent             General bed mobility comments: able to get EOB with assistance to move railings.   Transfers Overall transfer level: Needs assistance Equipment used: Rolling walker (2 wheeled) Transfers: Sit to/from Stand Sit to Stand: Supervision         General transfer comment: Supervision for safety  Ambulation/Gait Ambulation/Gait assistance: Min guard;Min assist Ambulation Distance (Feet): 100 Feet Assistive device: Rolling walker (2 wheeled) Gait Pattern/deviations: Step-through pattern;Ataxic;Narrow base of support Gait velocity: WFL Gait velocity interpretation: at or above normal speed for age/gender General Gait  Details: Minimal ataxia noted with some scissoring noted. Pt had 1 LOB with RW and required Min A to recover but is otherwise able to ambulate without assitance.   Stairs            Wheelchair Mobility    Modified Rankin (Stroke Patients Only)       Balance Overall balance assessment: Needs assistance Sitting-balance support: No upper extremity supported Sitting balance-Leahy Scale: Normal     Standing balance support: No upper extremity supported;Single extremity supported Standing balance-Leahy Scale: Fair Standing balance comment: able to stand without an AD, but supervision for safety                             Pertinent Vitals/Pain Pain Assessment: No/denies pain    Home Living Family/patient expects to be discharged to:: Private residence Living Arrangements: Spouse/significant other Available Help at Discharge: Family Type of Home: House Home Access: Level entry     Home Layout: One level Home Equipment: Environmental consultant - 2 wheels      Prior Function Level of Independence: Independent         Comments: independent with ADLs and IADLs and very active per wife     Hand Dominance   Dominant Hand: Right    Extremity/Trunk Assessment   Upper Extremity Assessment Upper Extremity Assessment: Overall WFL for tasks assessed    Lower Extremity Assessment Lower Extremity Assessment: Generalized weakness    Cervical / Trunk Assessment Cervical / Trunk Assessment: Normal  Communication   Communication: No difficulties  Cognition Arousal/Alertness: Awake/alert Behavior During Therapy: Impulsive Overall Cognitive Status:  Within Functional Limits for tasks assessed                                 General Comments: pt has been agitated during stay but is appropriate this visit      General Comments General comments (skin integrity, edema, etc.): Wife is present throughout assessment. Pt seems agitated with wife for bringing him  into hospital    Exercises     Assessment/Plan    PT Assessment Patient needs continued PT services  PT Problem List Decreased strength;Decreased balance;Decreased mobility;Decreased safety awareness;Decreased knowledge of use of DME       PT Treatment Interventions DME instruction;Gait training;Functional mobility training;Therapeutic activities;Therapeutic exercise;Balance training    PT Goals (Current goals can be found in the Care Plan section)  Acute Rehab PT Goals Patient Stated Goal: to get home soon PT Goal Formulation: With patient Time For Goal Achievement: 03/07/18 Potential to Achieve Goals: Good    Frequency Min 3X/week   Barriers to discharge        Co-evaluation               AM-PAC PT "6 Clicks" Daily Activity  Outcome Measure Difficulty turning over in bed (including adjusting bedclothes, sheets and blankets)?: None Difficulty moving from lying on back to sitting on the side of the bed? : None Difficulty sitting down on and standing up from a chair with arms (e.g., wheelchair, bedside commode, etc,.)?: A Little Help needed moving to and from a bed to chair (including a wheelchair)?: A Little Help needed walking in hospital room?: A Little Help needed climbing 3-5 steps with a railing? : A Little 6 Click Score: 20    End of Session Equipment Utilized During Treatment: Gait belt Activity Tolerance: Patient tolerated treatment well Patient left: in bed;with call bell/phone within reach;with family/visitor present;with nursing/sitter in room Nurse Communication: Mobility status PT Visit Diagnosis: Unsteadiness on feet (R26.81);Other abnormalities of gait and mobility (R26.89);Muscle weakness (generalized) (M62.81)    Time: 7106-2694 PT Time Calculation (min) (ACUTE ONLY): 15 min   Charges:   PT Evaluation $PT Eval Moderate Complexity: 1 Mod     PT G Codes:        Scheryl Marten PT, DPT     Jamesyn Lindell Sloan Leiter 02/28/2018, 11:41 AM

## 2018-02-28 NOTE — Progress Notes (Signed)
CSW alerted by MD that patient is cleared by psychiatry and has capacity to make decisions today; IVC can be rescinded at this time. CSW completed paperwork and sent to [facility] of [employer].   Patient is no longer under IVC.  CSW met with patient to discuss alcohol abuse. CSW discussed medical team's concern about patient's drinking, and that he needs to stop because he is on medications that shouldn't be mixed with alcohol. Patient was agreeable to receiving information on alcohol cessation services in the [place] area, CSW left resources with patient.  CSW signing off.  Laveda Abbe, [place] Clinical Social Worker [phone]

## 2018-02-28 NOTE — Care Management (Signed)
Spoke with patient and wife at [facility] regarding [place] services.  Pt states he doesn't need or want services.  After giving the patient and wife time to think and discuss, the patient still refuses.  Advised they can call me in the next hour if they change their minds.
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
## 2018-02-28 NOTE — Progress Notes (Signed)
Mittens placed. Pt pulling at line.

## 2018-03-01 LAB — FOLATE RBC
FOLATE, HEMOLYSATE: 357.3 ng/mL
Folate, RBC: 958 ng/mL (ref >498)
HEMATOCRIT: 37.3 % — AB (ref 37.5–51.0)

## 2018-03-03 LAB — CULTURE, BLOOD (ROUTINE X 2)
CULTURE: NO GROWTH
Culture: NO GROWTH
Special Requests: ADEQUATE

## 2018-03-08 DIAGNOSIS — H2102 Hyphema, left eye: Secondary | ICD-10-CM | POA: Diagnosis not present

## 2018-03-09 DIAGNOSIS — H2102 Hyphema, left eye: Secondary | ICD-10-CM | POA: Diagnosis not present

## 2018-03-12 ENCOUNTER — Other Ambulatory Visit: Payer: Self-pay | Admitting: Neurosurgery

## 2018-03-12 DIAGNOSIS — F29 Unspecified psychosis not due to a substance or known physiological condition: Secondary | ICD-10-CM | POA: Diagnosis not present

## 2018-03-12 DIAGNOSIS — F3289 Other specified depressive episodes: Secondary | ICD-10-CM | POA: Diagnosis not present

## 2018-03-12 DIAGNOSIS — H2000 Unspecified acute and subacute iridocyclitis: Secondary | ICD-10-CM | POA: Diagnosis not present

## 2018-03-12 DIAGNOSIS — H3320 Serous retinal detachment, unspecified eye: Secondary | ICD-10-CM | POA: Diagnosis not present

## 2018-03-12 DIAGNOSIS — K589 Irritable bowel syndrome without diarrhea: Secondary | ICD-10-CM | POA: Diagnosis not present

## 2018-03-12 DIAGNOSIS — K760 Fatty (change of) liver, not elsewhere classified: Secondary | ICD-10-CM | POA: Diagnosis not present

## 2018-03-12 DIAGNOSIS — N3281 Overactive bladder: Secondary | ICD-10-CM | POA: Diagnosis not present

## 2018-03-12 DIAGNOSIS — M5136 Other intervertebral disc degeneration, lumbar region: Secondary | ICD-10-CM | POA: Diagnosis not present

## 2018-03-12 DIAGNOSIS — J449 Chronic obstructive pulmonary disease, unspecified: Secondary | ICD-10-CM | POA: Diagnosis not present

## 2018-03-12 DIAGNOSIS — I129 Hypertensive chronic kidney disease with stage 1 through stage 4 chronic kidney disease, or unspecified chronic kidney disease: Secondary | ICD-10-CM | POA: Diagnosis not present

## 2018-03-12 DIAGNOSIS — M48061 Spinal stenosis, lumbar region without neurogenic claudication: Secondary | ICD-10-CM | POA: Diagnosis not present

## 2018-03-12 DIAGNOSIS — N183 Chronic kidney disease, stage 3 (moderate): Secondary | ICD-10-CM | POA: Diagnosis not present

## 2018-03-15 ENCOUNTER — Encounter (INDEPENDENT_AMBULATORY_CARE_PROVIDER_SITE_OTHER): Payer: Medicare Other | Admitting: Ophthalmology

## 2018-03-15 DIAGNOSIS — H353112 Nonexudative age-related macular degeneration, right eye, intermediate dry stage: Secondary | ICD-10-CM

## 2018-03-15 DIAGNOSIS — H33301 Unspecified retinal break, right eye: Secondary | ICD-10-CM | POA: Diagnosis not present

## 2018-03-15 DIAGNOSIS — H20012 Primary iridocyclitis, left eye: Secondary | ICD-10-CM

## 2018-03-15 DIAGNOSIS — I10 Essential (primary) hypertension: Secondary | ICD-10-CM

## 2018-03-15 DIAGNOSIS — T8522XA Displacement of intraocular lens, initial encounter: Secondary | ICD-10-CM

## 2018-03-15 DIAGNOSIS — H43813 Vitreous degeneration, bilateral: Secondary | ICD-10-CM

## 2018-03-15 DIAGNOSIS — H35033 Hypertensive retinopathy, bilateral: Secondary | ICD-10-CM

## 2018-03-17 DIAGNOSIS — M5136 Other intervertebral disc degeneration, lumbar region: Secondary | ICD-10-CM | POA: Diagnosis not present

## 2018-03-17 DIAGNOSIS — F29 Unspecified psychosis not due to a substance or known physiological condition: Secondary | ICD-10-CM | POA: Diagnosis not present

## 2018-03-17 DIAGNOSIS — I129 Hypertensive chronic kidney disease with stage 1 through stage 4 chronic kidney disease, or unspecified chronic kidney disease: Secondary | ICD-10-CM | POA: Diagnosis not present

## 2018-03-17 DIAGNOSIS — M48061 Spinal stenosis, lumbar region without neurogenic claudication: Secondary | ICD-10-CM | POA: Diagnosis not present

## 2018-03-17 DIAGNOSIS — N183 Chronic kidney disease, stage 3 (moderate): Secondary | ICD-10-CM | POA: Diagnosis not present

## 2018-03-17 DIAGNOSIS — J449 Chronic obstructive pulmonary disease, unspecified: Secondary | ICD-10-CM | POA: Diagnosis not present

## 2018-03-19 DIAGNOSIS — F29 Unspecified psychosis not due to a substance or known physiological condition: Secondary | ICD-10-CM | POA: Diagnosis not present

## 2018-03-19 DIAGNOSIS — N183 Chronic kidney disease, stage 3 (moderate): Secondary | ICD-10-CM | POA: Diagnosis not present

## 2018-03-19 DIAGNOSIS — I129 Hypertensive chronic kidney disease with stage 1 through stage 4 chronic kidney disease, or unspecified chronic kidney disease: Secondary | ICD-10-CM | POA: Diagnosis not present

## 2018-03-19 DIAGNOSIS — M5136 Other intervertebral disc degeneration, lumbar region: Secondary | ICD-10-CM | POA: Diagnosis not present

## 2018-03-19 DIAGNOSIS — J449 Chronic obstructive pulmonary disease, unspecified: Secondary | ICD-10-CM | POA: Diagnosis not present

## 2018-03-19 DIAGNOSIS — M48061 Spinal stenosis, lumbar region without neurogenic claudication: Secondary | ICD-10-CM | POA: Diagnosis not present

## 2018-03-22 DIAGNOSIS — I129 Hypertensive chronic kidney disease with stage 1 through stage 4 chronic kidney disease, or unspecified chronic kidney disease: Secondary | ICD-10-CM | POA: Diagnosis not present

## 2018-03-22 DIAGNOSIS — F29 Unspecified psychosis not due to a substance or known physiological condition: Secondary | ICD-10-CM | POA: Diagnosis not present

## 2018-03-22 DIAGNOSIS — M5136 Other intervertebral disc degeneration, lumbar region: Secondary | ICD-10-CM | POA: Diagnosis not present

## 2018-03-22 DIAGNOSIS — M48061 Spinal stenosis, lumbar region without neurogenic claudication: Secondary | ICD-10-CM | POA: Diagnosis not present

## 2018-03-22 DIAGNOSIS — N183 Chronic kidney disease, stage 3 (moderate): Secondary | ICD-10-CM | POA: Diagnosis not present

## 2018-03-22 DIAGNOSIS — J449 Chronic obstructive pulmonary disease, unspecified: Secondary | ICD-10-CM | POA: Diagnosis not present

## 2018-03-26 DIAGNOSIS — J449 Chronic obstructive pulmonary disease, unspecified: Secondary | ICD-10-CM | POA: Diagnosis not present

## 2018-03-26 DIAGNOSIS — M5136 Other intervertebral disc degeneration, lumbar region: Secondary | ICD-10-CM | POA: Diagnosis not present

## 2018-03-26 DIAGNOSIS — I129 Hypertensive chronic kidney disease with stage 1 through stage 4 chronic kidney disease, or unspecified chronic kidney disease: Secondary | ICD-10-CM | POA: Diagnosis not present

## 2018-03-26 DIAGNOSIS — M48061 Spinal stenosis, lumbar region without neurogenic claudication: Secondary | ICD-10-CM | POA: Diagnosis not present

## 2018-03-26 DIAGNOSIS — N183 Chronic kidney disease, stage 3 (moderate): Secondary | ICD-10-CM | POA: Diagnosis not present

## 2018-03-26 DIAGNOSIS — F29 Unspecified psychosis not due to a substance or known physiological condition: Secondary | ICD-10-CM | POA: Diagnosis not present

## 2018-03-29 DIAGNOSIS — M48061 Spinal stenosis, lumbar region without neurogenic claudication: Secondary | ICD-10-CM | POA: Diagnosis not present

## 2018-03-29 DIAGNOSIS — I129 Hypertensive chronic kidney disease with stage 1 through stage 4 chronic kidney disease, or unspecified chronic kidney disease: Secondary | ICD-10-CM | POA: Diagnosis not present

## 2018-03-29 DIAGNOSIS — M5136 Other intervertebral disc degeneration, lumbar region: Secondary | ICD-10-CM | POA: Diagnosis not present

## 2018-03-29 DIAGNOSIS — N183 Chronic kidney disease, stage 3 (moderate): Secondary | ICD-10-CM | POA: Diagnosis not present

## 2018-03-29 DIAGNOSIS — F29 Unspecified psychosis not due to a substance or known physiological condition: Secondary | ICD-10-CM | POA: Diagnosis not present

## 2018-03-29 DIAGNOSIS — J449 Chronic obstructive pulmonary disease, unspecified: Secondary | ICD-10-CM | POA: Diagnosis not present

## 2018-03-31 ENCOUNTER — Emergency Department (HOSPITAL_COMMUNITY): Payer: Medicare Other

## 2018-03-31 ENCOUNTER — Emergency Department (HOSPITAL_COMMUNITY)
Admission: EM | Admit: 2018-03-31 | Discharge: 2018-03-31 | Disposition: A | Discharge status: Home / Self Care | Payer: Medicare Other | Attending: Emergency Medicine | Admitting: Emergency Medicine

## 2018-03-31 ENCOUNTER — Other Ambulatory Visit: Payer: Self-pay

## 2018-03-31 ENCOUNTER — Encounter (HOSPITAL_COMMUNITY): Payer: Self-pay | Admitting: Emergency Medicine

## 2018-03-31 DIAGNOSIS — Z79899 Other long term (current) drug therapy: Secondary | ICD-10-CM | POA: Diagnosis not present

## 2018-03-31 DIAGNOSIS — R296 Repeated falls: Secondary | ICD-10-CM | POA: Diagnosis not present

## 2018-03-31 DIAGNOSIS — Z87891 Personal history of nicotine dependence: Secondary | ICD-10-CM | POA: Insufficient documentation

## 2018-03-31 DIAGNOSIS — M79605 Pain in left leg: Secondary | ICD-10-CM | POA: Insufficient documentation

## 2018-03-31 DIAGNOSIS — M79604 Pain in right leg: Secondary | ICD-10-CM | POA: Diagnosis not present

## 2018-03-31 DIAGNOSIS — R278 Other lack of coordination: Secondary | ICD-10-CM | POA: Insufficient documentation

## 2018-03-31 DIAGNOSIS — Z7982 Long term (current) use of aspirin: Secondary | ICD-10-CM | POA: Diagnosis not present

## 2018-03-31 DIAGNOSIS — M545 Low back pain: Secondary | ICD-10-CM | POA: Insufficient documentation

## 2018-03-31 DIAGNOSIS — Y939 Activity, unspecified: Secondary | ICD-10-CM | POA: Diagnosis not present

## 2018-03-31 DIAGNOSIS — S065X0A Traumatic subdural hemorrhage without loss of consciousness, initial encounter: Secondary | ICD-10-CM | POA: Diagnosis not present

## 2018-03-31 DIAGNOSIS — I10 Essential (primary) hypertension: Secondary | ICD-10-CM | POA: Diagnosis not present

## 2018-03-31 DIAGNOSIS — S065X9A Traumatic subdural hemorrhage with loss of consciousness of unspecified duration, initial encounter: Secondary | ICD-10-CM | POA: Insufficient documentation

## 2018-03-31 DIAGNOSIS — S0081XA Abrasion of other part of head, initial encounter: Secondary | ICD-10-CM | POA: Insufficient documentation

## 2018-03-31 DIAGNOSIS — M79661 Pain in right lower leg: Secondary | ICD-10-CM | POA: Diagnosis not present

## 2018-03-31 DIAGNOSIS — Y999 Unspecified external cause status: Secondary | ICD-10-CM | POA: Insufficient documentation

## 2018-03-31 DIAGNOSIS — R269 Unspecified abnormalities of gait and mobility: Secondary | ICD-10-CM | POA: Diagnosis not present

## 2018-03-31 DIAGNOSIS — W19XXXA Unspecified fall, initial encounter: Secondary | ICD-10-CM

## 2018-03-31 DIAGNOSIS — R413 Other amnesia: Secondary | ICD-10-CM | POA: Diagnosis not present

## 2018-03-31 DIAGNOSIS — R410 Disorientation, unspecified: Secondary | ICD-10-CM | POA: Diagnosis not present

## 2018-03-31 DIAGNOSIS — M549 Dorsalgia, unspecified: Secondary | ICD-10-CM | POA: Diagnosis not present

## 2018-03-31 DIAGNOSIS — S065XAA Traumatic subdural hemorrhage with loss of consciousness status unknown, initial encounter: Secondary | ICD-10-CM

## 2018-03-31 DIAGNOSIS — S0990XA Unspecified injury of head, initial encounter: Secondary | ICD-10-CM | POA: Diagnosis present

## 2018-03-31 DIAGNOSIS — W0110XA Fall on same level from slipping, tripping and stumbling with subsequent striking against unspecified object, initial encounter: Secondary | ICD-10-CM | POA: Diagnosis not present

## 2018-03-31 DIAGNOSIS — F1099 Alcohol use, unspecified with unspecified alcohol-induced disorder: Secondary | ICD-10-CM | POA: Diagnosis not present

## 2018-03-31 DIAGNOSIS — M79662 Pain in left lower leg: Secondary | ICD-10-CM | POA: Diagnosis not present

## 2018-03-31 DIAGNOSIS — S00211A Abrasion of right eyelid and periocular area, initial encounter: Secondary | ICD-10-CM | POA: Diagnosis not present

## 2018-03-31 DIAGNOSIS — Y929 Unspecified place or not applicable: Secondary | ICD-10-CM | POA: Insufficient documentation

## 2018-03-31 DIAGNOSIS — I6203 Nontraumatic chronic subdural hemorrhage: Secondary | ICD-10-CM | POA: Diagnosis not present

## 2018-03-31 LAB — COMPREHENSIVE METABOLIC PANEL
ALT: 17 U/L (ref 17–63)
ANION GAP: 8 (ref 5–15)
AST: 31 U/L (ref 15–41)
Albumin: 3.8 g/dL (ref 3.5–5.0)
Alkaline Phosphatase: 30 U/L — ABNORMAL LOW (ref 38–126)
BUN: 19 mg/dL (ref 6–20)
CO2: 23 mmol/L (ref 22–32)
Calcium: 9.2 mg/dL (ref 8.9–10.3)
Chloride: 112 mmol/L — ABNORMAL HIGH (ref 101–111)
Creatinine, Ser: 1.38 mg/dL — ABNORMAL HIGH (ref 0.61–1.24)
GFR calc Af Amer: 58 mL/min — ABNORMAL LOW (ref >60)
GFR, EST NON AFRICAN AMERICAN: 50 mL/min — AB (ref >60)
Glucose, Bld: 112 mg/dL — ABNORMAL HIGH (ref 65–99)
POTASSIUM: 3.7 mmol/L (ref 3.5–5.1)
Sodium: 143 mmol/L (ref 135–145)
Total Bilirubin: 0.6 mg/dL (ref 0.3–1.2)
Total Protein: 6.5 g/dL (ref 6.5–8.1)

## 2018-03-31 LAB — RAPID URINE DRUG SCREEN, HOSP PERFORMED
AMPHETAMINES: NOT DETECTED
BARBITURATES: NOT DETECTED
Benzodiazepines: POSITIVE — AB
Cocaine: NOT DETECTED
Opiates: NOT DETECTED
TETRAHYDROCANNABINOL: NOT DETECTED

## 2018-03-31 LAB — I-STAT CHEM 8, ED
BUN: 20 mg/dL (ref 6–20)
CALCIUM ION: 1.19 mmol/L (ref 1.15–1.40)
Chloride: 111 mmol/L (ref 101–111)
Creatinine, Ser: 1.5 mg/dL — ABNORMAL HIGH (ref 0.61–1.24)
Glucose, Bld: 108 mg/dL — ABNORMAL HIGH (ref 65–99)
HCT: 31 % — ABNORMAL LOW (ref 39.0–52.0)
Hemoglobin: 10.5 g/dL — ABNORMAL LOW (ref 13.0–17.0)
Potassium: 3.7 mmol/L (ref 3.5–5.1)
SODIUM: 145 mmol/L (ref 135–145)
TCO2: 23 mmol/L (ref 22–32)

## 2018-03-31 LAB — ETHANOL: Alcohol, Ethyl (B): 59 mg/dL — ABNORMAL HIGH (ref <10)

## 2018-03-31 LAB — I-STAT VENOUS BLOOD GAS, ED
ACID-BASE DEFICIT: 2 mmol/L (ref 0.0–2.0)
BICARBONATE: 22.8 mmol/L (ref 20.0–28.0)
O2 SAT: 38 %
PO2 VEN: 22 mmHg — AB (ref 32.0–45.0)
TCO2: 24 mmol/L (ref 22–32)
pCO2, Ven: 38.1 mmHg — ABNORMAL LOW (ref 44.0–60.0)
pH, Ven: 7.386 (ref 7.250–7.430)

## 2018-03-31 LAB — CBC
HEMATOCRIT: 31.2 % — AB (ref 39.0–52.0)
HEMOGLOBIN: 10.5 g/dL — AB (ref 13.0–17.0)
MCH: 32.5 pg (ref 26.0–34.0)
MCHC: 33.7 g/dL (ref 30.0–36.0)
MCV: 96.6 fL (ref 78.0–100.0)
Platelets: 202 10*3/uL (ref 150–400)
RBC: 3.23 MIL/uL — ABNORMAL LOW (ref 4.22–5.81)
RDW: 14.1 % (ref 11.5–15.5)
WBC: 3 10*3/uL — ABNORMAL LOW (ref 4.0–10.5)

## 2018-03-31 LAB — PROTIME-INR
INR: 1.06
Prothrombin Time: 13.8 seconds (ref 11.4–15.2)

## 2018-03-31 LAB — DIFFERENTIAL
Basophils Absolute: 0.1 10*3/uL (ref 0.0–0.1)
Basophils Relative: 2 %
EOS ABS: 0.3 10*3/uL (ref 0.0–0.7)
EOS PCT: 11 %
LYMPHS ABS: 0.9 10*3/uL (ref 0.7–4.0)
Lymphocytes Relative: 31 %
MONO ABS: 0.3 10*3/uL (ref 0.1–1.0)
Monocytes Relative: 9 %
Neutro Abs: 1.4 10*3/uL — ABNORMAL LOW (ref 1.7–7.7)
Neutrophils Relative %: 47 %

## 2018-03-31 LAB — I-STAT TROPONIN, ED: Troponin i, poc: 0 ng/mL (ref 0.00–0.08)

## 2018-03-31 LAB — APTT: aPTT: 34 seconds (ref 24–36)

## 2018-03-31 LAB — BRAIN NATRIURETIC PEPTIDE: B NATRIURETIC PEPTIDE 5: 41.2 pg/mL (ref 0.0–100.0)

## 2018-03-31 LAB — CBG MONITORING, ED: Glucose-Capillary: 99 mg/dL (ref 65–99)

## 2018-03-31 LAB — SALICYLATE LEVEL

## 2018-03-31 LAB — ACETAMINOPHEN LEVEL

## 2018-03-31 LAB — AMMONIA: Ammonia: 22 umol/L (ref 9–35)

## 2018-03-31 NOTE — ED Notes (Signed)
Patient transported to X-ray 

## 2018-03-31 NOTE — Consult Note (Signed)
Chief Complaint   Chief Complaint  Patient presents with  . Fall    HPI   HPI: Brandon Fleming is a 72 y.o. male who was brought to ER by family due to disorientation. Wife and friend at bedside helping with history.   Patient reports he is here due to lower back pain and gait instability. Known history of lumbar radiculopathy. Scheduled for PLIF 05/05/2018 by Dr Arnoldo Morale. Has been having [facility] work with him up until surgery due to gait instability. He has fallen multiple times over the past several weeks. Reports mechanical in nature (tripping over curbs, rugs, etc). No worsening leg weakness, focal deficits or bowel/bladder dysfunction.  Wife reports they are here due to disorientation and recurrent head trauma to ensure no underlying pathology. Wife states there has been a slow, progressive memory loss over the past several months. Simple things like forgetting where rest room is but also some difficulties with doing his normal job. He is a Engineer, maintenance (IT) and has been unable to follow the tax protocol which is unlike him. Wife also states he has developed a RUE tremor. Has a FH of parkinsons but has never been to neurology. He also has a history of alcohol abuse and was recently hospitalized due to d/t.  She has not noticed any focal deficits. He does have gait instability but this is secondary to lumbar radiculopathy.  Because of his falls with recurrent head trauma, patient underwent head CT which was significant for small chronic SDH vs hygroma. Neurosurgery consult requested due to findings.  Patient Active Problem List   Diagnosis Date Noted  . Alcohol withdrawal ([place]) 02/28/2018  . Weakness 02/26/2018  . Essential hypertension 02/26/2018  . HLD (hyperlipidemia) 02/26/2018  . Tremors of nervous system 02/26/2018  . Alcohol abuse 02/26/2018  . Weakness generalized 02/26/2018    PMH: Past Medical History:  Diagnosis Date  . Hyperlipidemia   . Hypertension   . Kidney damage      PSH: Past Surgical History:  Procedure Laterality Date  . KNEE SURGERY       (Not in a hospital admission)  SH: Social History   Tobacco Use  . Smoking status: Former Research scientist (life sciences)  . Smokeless tobacco: Never Used  Substance Use Topics  . Alcohol use: Yes    Alcohol/week: 2.4 oz    Types: 4 Standard drinks or equivalent per week  . Drug use: No    MEDS: Prior to Admission medications   Medication Sig Start Date End Date Taking? Authorizing Provider  amLODipine (NORVASC) 5 MG tablet Take 5 mg by mouth daily.  11/02/17  Yes [provider]  aspirin EC 81 MG tablet Take 81 mg by mouth daily.   Yes [provider]  chlordiazePOXIDE (LIBRIUM) 25 MG capsule Take 25 mg by mouth See admin instructions. Take 1 tablet (73m) daily for 2 weeks, then take 1 tablet (260m every other day for 2 weeks, then take 1 tablet (2545mevery third day for 2 weeks. Then stop. 03/16/18  Yes [provider]  esomeprazole (NEXIUM) 20 MG capsule Take 20 mg by mouth daily at 12 noon.   Yes [provider]  fenofibrate (TRICOR) 145 MG tablet Take 145 mg by mouth daily. 02/17/18  Yes [provider]  FLUoxetine (PROZAC) 40 MG capsule Take 40 mg by mouth daily. 03/03/18  Yes [provider]  folic acid (FOLVITE) 1 MG tablet Take 1 tablet (1 mg total) by mouth daily. 03/01/18 03/31/18 Yes PowFlorene Glen  Clint Lipps., MD  linaclotide [facility]) 72 MCG capsule Take 72 mcg by mouth daily before breakfast.   Yes [provider]  lisinopril (PRINIVIL,ZESTRIL) 20 MG tablet Take 20 mg by mouth daily.  12/22/13  Yes [provider]  Multiple Vitamins-Minerals (MULTIVITAMIN WITH MINERALS) tablet Take 1 tablet by mouth daily.   Yes [provider]  oxyCODONE-acetaminophen (PERCOCET/ROXICET) 5-325 MG tablet Take 1 tablet by mouth every 6 (six) hours as needed for moderate pain.  01/29/18  Yes [provider]  rosuvastatin (CRESTOR) 10 MG tablet Take 10  mg by mouth daily.  12/28/13  Yes [provider]  sildenafil (VIAGRA) 100 MG tablet Take 100 mg by mouth daily as needed for erectile dysfunction.   Yes [provider]  thiamine 100 MG tablet Take 100 mg by mouth daily.   Yes [provider]    ALLERGY: No Known Allergies  Social History   Tobacco Use  . Smoking status: Former Research scientist (life sciences)  . Smokeless tobacco: Never Used  Substance Use Topics  . Alcohol use: Yes    Alcohol/week: 2.4 oz    Types: 4 Standard drinks or equivalent per week     Family History  Problem Relation Age of Onset  . CAD Father   . Anesthesia problems Neg Hx   . Broken bones Neg Hx   . Cancer Neg Hx   . Clotting disorder Neg Hx   . Collagen disease Neg Hx   . Diabetes Neg Hx   . Dislocations Neg Hx   . Osteoporosis Neg Hx   . Rheumatologic disease Neg Hx   . Scoliosis Neg Hx   . Severe sprains Neg Hx      ROS   Review of Systems  Constitutional: Negative.   HENT: Negative.   Eyes: Negative.   Respiratory: Negative.   Cardiovascular: Negative.   Gastrointestinal: Negative.   Genitourinary: Negative.   Musculoskeletal: Positive for back pain and falls. Negative for joint pain, myalgias and neck pain.  Skin: Negative.   Neurological: Positive for tingling (BLE).    Exam   Vitals:   03/31/18 1330 03/31/18 1341  BP: (!) 147/83   Pulse: 89   Resp: 12   Temp:  97.9 F (36.6 C)  SpO2: 96%    General appearance: WDWN, NAD, resting comfortably Eyes: PERRL, Fundoscopic: normal Cardiovascular: Regular rate and rhythm without murmurs, rubs, gallops. No edema or variciosities. Distal pulses normal. Pulmonary: Clear to auscultation Musculoskeletal:     Muscle tone upper extremities: Normal    Muscle tone lower extremities: Normal    Motor exam: Upper Extremities Deltoid Bicep Tricep Grip  Right 5/5 5/5 5/5 5/5  Left 5/5 5/5 5/5 5/5   Lower Extremity IP Quad PF DF EHL  Right 5/5 5/5 5/5 5/5 5/5  Left 5/5 5/5 5/5  5/5 5/5   Neurological Awake, alert, oriented to self, location and date of birth. No oriented to year. Memory and concentration grossly intact. Able to tell me what he had to eat yesterday. Speech fluent, appropriate. Aggravated with questioning. CNII: Visual fields normal CNIII/IV/VI: EOMI CNV: Facial sensation normal CNVII: Symmetric, normal strength CNVIII: Grossly normal CNIX: Normal palate movement CNXI: Trap and SCM strength normal CN XII: Tongue protrusion normal Sensation grossly intact to LT DTR: Normal Coordination (finger/nose & heel/shin): Grossly normal Difficulties with following multi-step commands  Results - Imaging/Labs   Results for orders placed or performed during the hospital encounter of 03/31/18 (from the past 48 hour(s))  Protime-INR  Status: None   Collection Time: 03/31/18  9:56 AM  Result Value Ref Range   Prothrombin Time 13.8 11.4 - 15.2 seconds   INR 1.06     Comment: Performed at [facility], [place] [address] [place], [place] [address]  APTT     Status: None   Collection Time: 03/31/18  9:56 AM  Result Value Ref Range   aPTT 34 24 - 36 seconds    Comment: Performed at [facility] [address], [place], [place] [address]  CBC     Status: Abnormal   Collection Time: 03/31/18  9:56 AM  Result Value Ref Range   WBC 3.0 (L) 4.0 - 10.5 K/uL   RBC 3.23 (L) 4.22 - 5.81 MIL/uL   Hemoglobin 10.5 (L) 13.0 - 17.0 g/dL   HCT 31.2 (L) 39.0 - 52.0 %   MCV 96.6 78.0 - 100.0 fL   MCH 32.5 26.0 - 34.0 pg   MCHC 33.7 30.0 - 36.0 g/dL   RDW 14.1 11.5 - 15.5 %   Platelets 202 150 - 400 K/uL    Comment: Performed at [facility], [place] [address], [place], [place] [address]  Differential     Status: Abnormal   Collection Time: 03/31/18  9:56 AM  Result Value Ref Range   Neutrophils Relative % 47 %   Neutro Abs 1.4 (L) 1.7 - 7.7 K/uL   Lymphocytes Relative 31 %   Lymphs Abs 0.9 0.7 - 4.0 K/uL   Monocytes Relative 9 %   Monocytes  Absolute 0.3 0.1 - 1.0 K/uL   Eosinophils Relative 11 %   Eosinophils Absolute 0.3 0.0 - 0.7 K/uL   Basophils Relative 2 %   Basophils Absolute 0.1 0.0 - 0.1 K/uL    Comment: Performed at [facility] [address] [place], [place] [address]  Comprehensive metabolic panel     Status: Abnormal   Collection Time: 03/31/18  9:56 AM  Result Value Ref Range   Sodium 143 135 - 145 mmol/L   Potassium 3.7 3.5 - 5.1 mmol/L   Chloride 112 (H) 101 - 111 mmol/L   CO2 23 22 - 32 mmol/L   Glucose, Bld 112 (H) 65 - 99 mg/dL   BUN 19 6 - 20 mg/dL   Creatinine, Ser 1.38 (H) 0.61 - 1.24 mg/dL   Calcium 9.2 8.9 - 10.3 mg/dL   Total Protein 6.5 6.5 - 8.1 g/dL   Albumin 3.8 3.5 - 5.0 g/dL   AST 31 15 - 41 U/L   ALT 17 17 - 63 U/L   Alkaline Phosphatase 30 (L) 38 - 126 U/L   Total Bilirubin 0.6 0.3 - 1.2 mg/dL   GFR calc non Af Amer 50 (L) >60 mL/min   GFR calc Af Amer 58 (L) >60 mL/min    Comment: (NOTE) The eGFR has been calculated using the CKD EPI equation. This calculation has not been validated in all clinical situations. eGFR's persistently <60 mL/min signify possible Chronic Kidney Disease.    Anion gap 8 5 - 15    Comment: Performed at [facility] [address] [place], [place] [address]  I-stat troponin, ED     Status: None   Collection Time: 03/31/18 10:01 AM  Result Value Ref Range   Troponin i, poc 0.00 0.00 - 0.08 ng/mL   Comment 3            Comment: Due to the release kinetics  of cTnI, a negative result within the first hours of the onset of symptoms does not rule out myocardial infarction with certainty. If myocardial infarction is still suspected, repeat the test at appropriate intervals.   I-Stat Chem 8, ED     Status: Abnormal   Collection Time: 03/31/18 10:03 AM  Result Value Ref Range   Sodium 145 135 - 145 mmol/L   Potassium 3.7 3.5 - 5.1 mmol/L   Chloride 111 101 - 111 mmol/L   BUN 20 6 - 20 mg/dL   Creatinine, Ser 1.50 (H) 0.61 - 1.24 mg/dL    Glucose, Bld 108 (H) 65 - 99 mg/dL   Calcium, Ion 1.19 1.15 - 1.40 mmol/L   TCO2 23 22 - 32 mmol/L   Hemoglobin 10.5 (L) 13.0 - 17.0 g/dL   HCT 31.0 (L) 39.0 - 52.0 %  CBG monitoring, ED     Status: None   Collection Time: 03/31/18 11:53 AM  Result Value Ref Range   Glucose-Capillary 99 65 - 99 mg/dL  Ammonia     Status: None   Collection Time: 03/31/18  1:05 PM  Result Value Ref Range   Ammonia 22 9 - 35 umol/L    Comment: Performed at [facility], [place] [address], [place], [place] [address]  I-Stat Venous Blood Gas, ED (order at [facility] and [facility] only)     Status: Abnormal   Collection Time: 03/31/18  1:20 PM  Result Value Ref Range   pH, Ven 7.386 7.250 - 7.430   pCO2, Ven 38.1 (L) 44.0 - 60.0 mmHg   pO2, Ven 22.0 (LL) 32.0 - 45.0 mmHg   Bicarbonate 22.8 20.0 - 28.0 mmol/L   TCO2 24 22 - 32 mmol/L   O2 Saturation 38.0 %   Acid-base deficit 2.0 0.0 - 2.0 mmol/L   Patient temperature HIDE    Sample type VENOUS    Comment NOTIFIED PHYSICIAN     Dg Chest 2 View  Result Date: 03/31/2018 CLINICAL DATA:  Bilateral lower extremity pain. EXAM: CHEST - 2 VIEW COMPARISON:  PA and lateral chest 02/26/2018. FINDINGS: The lungs are clear. Heart size is normal. No pneumothorax or pleural effusion. No acute bony abnormality. IMPRESSION: No acute disease. Electronically Signed   By: Inge Rise M.D.   On: 03/31/2018 12:50   Ct Head Wo Contrast  Result Date: 03/31/2018 CLINICAL DATA:  Several recent falls, hit left side of head. Confusion. EXAM: CT HEAD WITHOUT CONTRAST TECHNIQUE: Contiguous axial images were obtained from the base of the skull through the vertex without intravenous contrast. COMPARISON:  02/27/2018 FINDINGS: Brain: Prominent low-density subdural fluid noted on the left measuring 6 mm in thickness, new since prior study compatible with chronic subdural hematoma or hygroma. No mass effect or midline shift. No intracerebral hemorrhage or acute infarction. Vascular: No  hyperdense vessel or unexpected calcification. Skull: No acute calvarial abnormality. Sinuses/Orbits: Mucosal thickening in the maxillary sinuses. No air-fluid levels. Mastoid air cells are clear. Orbital soft tissues unremarkable. Other: None IMPRESSION: New low-density subdural fluid collection over the right cerebral hemisphere most compatible with chronic subdural hematoma or hygroma. Electronically Signed   By: Rolm Baptise M.D.   On: 03/31/2018 10:55    Impression/Plan   72 y.o. male with small, 38m chronic SDH vs hygroma over the right cerebral hemisphere. No mass effect or MLS. He is neurologically intact with the exception of disorientation to year and following two step commands. This chronic SDH/hygroma  is nonsurgical and should hopefully  heal with time. Would not expect this to cause his slow, progressive memory issues over the course of months. This is SDH/hygroma is likely the result of a fall several weeks ago. He is safe for d/c with f/u outpt with Dr Arnoldo Morale. Because of his memory issues and hx of parkinsons, I have referred him outpt to Neuro.

## 2018-03-31 NOTE — ED Provider Notes (Signed)
Medical screening examination/treat with a history of alcohol abuse ment/procedure(s) were conducted as a shared visit with non-physician practitioner(s) and myself.  I personally evaluated the patient during the encounter. Briefly, the patient is a 72 y.o. male recently admitted for suspected alcohol withdrawal last month and obtain imaging negative for any ICH presents to the emergency department after recurrent falls and head trauma.  Family reports that patient continues to drink alcohol.  States that he refuses to take his home medication.  Notices confusion and ataxia which is related to alcohol intake.  Reports that both of these have been improving over the past several days, more so today since he has not had any alcohol consumption.  Work-up revealed remote subdural hematoma but new since last month. NSU consulted who evaluated the patient and cleared for OP follow up. There is no evidence of withdrawal at this time. The patient appears reasonably screened and/or stabilized for discharge and I doubt any other medical condition or other [facility] requiring further screening, evaluation, or treatment in the ED at this time prior to discharge.    EKG Interpretation  Date/Time:  Wednesday March 31 2018 09:59:19 EDT Ventricular Rate:  66 PR Interval:  190 QRS Duration: 92 QT Interval:  422 QTC Calculation: 442 R Axis:   -11 Text Interpretation:  Normal sinus rhythm Borderline ECG No significant change since last tracing Confirmed by Addison Lank [id number]) on 04/01/2018 11:15:57 AM          Dane Kopke, Grayce Sessions, MD 04/01/18 1116

## 2018-03-31 NOTE — Discharge Instructions (Signed)
Today you were found to have a subdural hematoma on the CT scan of your head.  You will need to follow-up with a neurosurgeon for reevaluation.  Your kidney function was also slightly elevated today and you will need to make sure that you are staying well-hydrated over the next several days and have this lab rechecked by her primary care doctor.  Please follow-up with your primary care doctor within 1 week and call the neurosurgery tomorrow to make an appointment for follow-up.  Please return to the emergency room for any new or worsening symptoms including any altered mental status, weakness, numbness.

## 2018-03-31 NOTE — ED Triage Notes (Signed)
Per family and pt. Pt began having mobility issues in January with frequent falls. Has surgery scheduled  May 22nd, [place]. In home PT recommenced he come here for evaluation for short term memory that started several weeks ago, maybe 3. Pt also has been shaking. Pt has also had decreased appetite, decreased oral intake, does not like to take his oral medications anymore either.

## 2018-03-31 NOTE — ED Provider Notes (Signed)
[facility] EMERGENCY DEPARTMENT Provider Note   CSN: [id number] Arrival date & time: 03/31/18  0854     History   Chief Complaint Chief Complaint  Patient presents with  . Fall    HPI Brandon Fleming is a 72 y.o. male.  HPI   Patient is a 72 year old male with a history of hyperlipidemia, hypertension, EtOH abuse, tremors who presents the ED today with his family to be evaluated for increasing falls and abnormal balance that has been progressively worsening since he was discharged from the hospital.  They also note that patient has had issues with short-term memory loss and confusion.  He is now refusing to take his oral medications as well.  Family states that physical therapy was in the home today to help patient prepare for upcoming lumbar surgery that is scheduled on 5/22.  They state that physical therapist noted the patient to have difficulty following commands and felt that he needed further evaluation. Family states mental status has been fluctuating since he was discharged from hospital and is largely based on his ETOH intake.  Upon questioning the patient, he states that he is here because he has pain to his bilateral legs.  He does endorse daily alcohol use since he was discharged from the hospital.  States he drinks about 5-6 drinks (glasses) of bourbon per day.  When asked about multiple falls patient states that also mechanical and he has been tripping over curves, etc.  Family endorses that he has hit his head on the ground multiple times.  Patient denies any loss of consciousness, neck pain, or upper back pain.  He does have chronic lower back pain which is unchanged.  Patient denies prodrome to the fall such as chest pain, shortness of breath, lightheadedness, vision changes, palpitations.  On review of prior records patient was admitted from 02/26/18 to 02/28/18 for suspected alcohol withdrawal.  At that time he had been having weakness, tremors, nausea,  vomiting, and flulike symptoms for about 6 weeks.  He had also been having increased forgetfulness.  Ammonia, vitamin B12, vitamin b1 RPR and TSH were checked during admission and were negative.  Folate was also checked and was low.  During admission he had CT brain which showed frontal parietal atrophy without acute infarct, mass, or hemorrhage.  He also had MR brain with advanced biparietal cerebral volume loss otherwise normal for age.  His last progress note at discharge revealed that he was A&O x3.  Past Medical History:  Diagnosis Date  . Hyperlipidemia   . Hypertension   . Kidney damage     Patient Active Problem List   Diagnosis Date Noted  . Alcohol withdrawal ([place]) 02/28/2018  . Weakness 02/26/2018  . Essential hypertension 02/26/2018  . HLD (hyperlipidemia) 02/26/2018  . Tremors of nervous system 02/26/2018  . Alcohol abuse 02/26/2018  . Weakness generalized 02/26/2018    Past Surgical History:  Procedure Laterality Date  . KNEE SURGERY          Home Medications    Prior to Admission medications   Medication Sig Start Date End Date Taking? Authorizing Provider  amLODipine (NORVASC) 5 MG tablet Take 5 mg by mouth daily.  11/02/17  Yes [provider]  aspirin EC 81 MG tablet Take 81 mg by mouth daily.   Yes [provider]  chlordiazePOXIDE (LIBRIUM) 25 MG capsule Take 25 mg by mouth See admin instructions. Take 1 tablet (25mg ) daily for 2 weeks, then take 1 tablet (  25mg ) every other day for 2 weeks, then take 1 tablet (25mg ) every third day for 2 weeks. Then stop. 03/16/18  Yes [provider]  esomeprazole (NEXIUM) 20 MG capsule Take 20 mg by mouth daily at 12 noon.   Yes [provider]  fenofibrate (TRICOR) 145 MG tablet Take 145 mg by mouth daily. 02/17/18  Yes [provider]  FLUoxetine (PROZAC) 40 MG capsule Take 40 mg by mouth daily. 03/03/18  Yes [provider]  folic acid (FOLVITE) 1 MG tablet Take 1 tablet  (1 mg total) by mouth daily. 03/01/18 03/31/18 Yes Elodia Florence., MD  linaclotide [facility]) 72 MCG capsule Take 72 mcg by mouth daily before breakfast.   Yes [provider]  lisinopril (PRINIVIL,ZESTRIL) 20 MG tablet Take 20 mg by mouth daily.  12/22/13  Yes [provider]  Multiple Vitamins-Minerals (MULTIVITAMIN WITH MINERALS) tablet Take 1 tablet by mouth daily.   Yes [provider]  oxyCODONE-acetaminophen (PERCOCET/ROXICET) 5-325 MG tablet Take 1 tablet by mouth every 6 (six) hours as needed for moderate pain.  01/29/18  Yes [provider]  rosuvastatin (CRESTOR) 10 MG tablet Take 10 mg by mouth daily.  12/28/13  Yes [provider]  sildenafil (VIAGRA) 100 MG tablet Take 100 mg by mouth daily as needed for erectile dysfunction.   Yes [provider]  thiamine 100 MG tablet Take 100 mg by mouth daily.   Yes [provider]    Family History Family History  Problem Relation Age of Onset  . CAD Father   . Anesthesia problems Neg Hx   . Broken bones Neg Hx   . Cancer Neg Hx   . Clotting disorder Neg Hx   . Collagen disease Neg Hx   . Diabetes Neg Hx   . Dislocations Neg Hx   . Osteoporosis Neg Hx   . Rheumatologic disease Neg Hx   . Scoliosis Neg Hx   . Severe sprains Neg Hx     Social History Social History   Tobacco Use  . Smoking status: Former Research scientist (life sciences)  . Smokeless tobacco: Never Used  Substance Use Topics  . Alcohol use: Yes    Alcohol/week: 2.4 oz    Types: 4 Standard drinks or equivalent per week  . Drug use: No     Allergies   Patient has no known allergies.   Review of Systems Review of Systems  Constitutional: Negative for fever.  HENT: Negative for sore throat.   Eyes:       Had increased IOP due to rejection of lens implant recently (blurred vision improving), no eye pain currently  Respiratory: Negative for shortness of breath.   Cardiovascular: Negative for chest pain.    Gastrointestinal: Negative for abdominal pain, constipation, diarrhea, nausea and vomiting.  Genitourinary: Negative for dysuria.  Musculoskeletal: Positive for back pain and gait problem. Negative for neck pain.  Skin: Negative for wound.  Neurological: Negative for dizziness, weakness, light-headedness, numbness and headaches.     Physical Exam Updated Vital Signs BP 140/81   Pulse 89   Temp 97.9 F (36.6 C)   Resp 10   Ht 5\' 6"  (1.676 m)   Wt 72.6 kg (160 lb)   SpO2 100%   BMI 25.82 kg/m   Physical Exam  Constitutional: He appears well-developed and well-nourished.  HENT:  Nose: Nose normal.  Mouth/Throat: Oropharynx is clear and moist.  Abrasion to right eyebrow that is well healing without signs of infection  Eyes: Pupils are equal, round, and reactive to light. Conjunctivae are normal.  Neck: Neck supple.  No midline cervical ttp  Cardiovascular: Normal rate, regular rhythm and normal heart sounds.  No murmur heard. Pulmonary/Chest: Effort normal and breath sounds normal. No respiratory distress. He has no wheezes.  Abdominal: Soft. Bowel sounds are normal. He exhibits no distension. There is no tenderness.  Musculoskeletal:  No thoracic or lumbar ttp.  1+ pitting edema bilaterally almost to the knees.  No erythema or calf tenderness.  Neurological: He is alert.  Oriented to self and place but not date or situation.  Patient able to state current president but not present prior to that.  Patient has possible slight right sided facial droop.  No ptosis.  Uvula raises midline.  PERRLA.  5/5 strength to bilateral upper and lower extremities.  Sensation intact bilateral upper and lower extremities throughout.  Normal facial sensation bilaterally.  Patient has steady gait without ataxia.  No pronator drift.  Negative Romberg.  Normal finger to nose bilaterally.  Normal heel-to-shin bilaterally.  Skin: Skin is warm and dry.  Psychiatric: He has a normal mood and affect.   Nursing note and vitals reviewed.   ED Treatments / Results  Labs (all labs ordered are listed, but only abnormal results are displayed) Labs Reviewed  CBC - Abnormal; Notable for the following components:      Result Value   WBC 3.0 (*)    RBC 3.23 (*)    Hemoglobin 10.5 (*)    HCT 31.2 (*)    All other components within normal limits  DIFFERENTIAL - Abnormal; Notable for the following components:   Neutro Abs 1.4 (*)    All other components within normal limits  COMPREHENSIVE METABOLIC PANEL - Abnormal; Notable for the following components:   Chloride 112 (*)    Glucose, Bld 112 (*)    Creatinine, Ser 1.38 (*)    Alkaline Phosphatase 30 (*)    GFR calc non Af Amer 50 (*)    GFR calc Af Amer 58 (*)    All other components within normal limits  ETHANOL - Abnormal; Notable for the following components:   Alcohol, Ethyl (B) 59 (*)    All other components within normal limits  RAPID URINE DRUG SCREEN, HOSP PERFORMED - Abnormal; Notable for the following components:   Benzodiazepines POSITIVE (*)    All other components within normal limits  ACETAMINOPHEN LEVEL - Abnormal; Notable for the following components:   Acetaminophen (Tylenol), Serum <10 (*)    All other components within normal limits  I-STAT CHEM 8, ED - Abnormal; Notable for the following components:   Creatinine, Ser 1.50 (*)    Glucose, Bld 108 (*)    Hemoglobin 10.5 (*)    HCT 31.0 (*)    All other components within normal limits  I-STAT VENOUS BLOOD GAS, ED - Abnormal; Notable for the following components:   pCO2, Ven 38.1 (*)    pO2, Ven 22.0 (*)    All other components within normal limits  PROTIME-INR  APTT  BRAIN NATRIURETIC PEPTIDE  AMMONIA  SALICYLATE LEVEL  I-STAT TROPONIN, ED  CBG MONITORING, ED    EKG None  Radiology Dg Chest 2 View  Result Date: 03/31/2018 CLINICAL DATA:  Bilateral lower extremity pain. EXAM: CHEST - 2 VIEW COMPARISON:  PA and lateral chest 02/26/2018. FINDINGS: The  lungs are clear. Heart size is normal. No pneumothorax or pleural effusion. No acute bony abnormality. IMPRESSION: No acute disease.  Electronically Signed   By: Inge Rise M.D.   On: 03/31/2018 12:50   Ct Head Wo Contrast  Result Date: 03/31/2018 CLINICAL DATA:  Several recent falls, hit left side of head. Confusion. EXAM: CT HEAD WITHOUT CONTRAST TECHNIQUE: Contiguous axial images were obtained from the base of the skull through the vertex without intravenous contrast. COMPARISON:  02/27/2018 FINDINGS: Brain: Prominent low-density subdural fluid noted on the left measuring 6 mm in thickness, new since prior study compatible with chronic subdural hematoma or hygroma. No mass effect or midline shift. No intracerebral hemorrhage or acute infarction. Vascular: No hyperdense vessel or unexpected calcification. Skull: No acute calvarial abnormality. Sinuses/Orbits: Mucosal thickening in the maxillary sinuses. No air-fluid levels. Mastoid air cells are clear. Orbital soft tissues unremarkable. Other: None IMPRESSION: New low-density subdural fluid collection over the right cerebral hemisphere most compatible with chronic subdural hematoma or hygroma. Electronically Signed   By: Rolm Baptise M.D.   On: 03/31/2018 10:55    Procedures Procedures (including critical care time)  Medications Ordered in ED Medications - No data to display   Initial Impression / Assessment and Plan / ED Course  I have reviewed the triage vital signs and the nursing notes.  Pertinent labs & imaging results that were available during my care of the patient were reviewed by me and considered in my medical decision making (see chart for details).   1:01PM CONSULT with [place], PA-C from neurosurgery who will see the pt.   Discussed pt presentation and exam findings with Dr. Leonette Monarch, who evaluated the patient and feels that the plan for discharge with neurosurgery follow-up.    Final Clinical Impressions(s) / ED  Diagnoses   Final diagnoses:  Subdural hematoma ([place])  Fall, initial encounter   Patient presenting with abnormal coordination, AMS, multiple falls with head trauma, progressive memory loss that has been ongoing for 3 weeks.  Mild hypertension but otherwise vital signs are stable.  Patient is disoriented to time and situation, but has no obvious focal deficit on exam. Lab work reassuring overall with normal coags. Leukopenia to 3.0, and hgb to 10.5. No sources of bleeding identified. CMP with elevated CR to 1.38. Liver enzymes WNL.  BNP and trop negative. ETOH at 59. UDS positive for benzos. salicylate and acetaminophen level WNL. VBG without acidosis. low CO2 38.1 however pt has normal bicarb.   ECG with HR 66, NSR, no ischemic changes. CXR without PNA or PTX. CT head with collection over right cerebral hemisphere most compatible with chronic subdural hematoma or hygroma. Neurosurgery consulted and will see the pt as an outpt. They do not feel memory problems and tremor are due to recent bleed and will plan to f/u with pt as an outpatient. No indication for surgery at this time. Dr. Leonette Monarch communicated plan to f/u with neurosurgery with patient and family. They understand the plan and agree to f/u with neurosurgery.   ED Discharge Orders    None       Rodney Booze, [place] 03/31/18 [id number]

## 2018-03-31 NOTE — ED Notes (Signed)
Pt becoming very agitated- requesting to leave. This RN explained to pt importance of staying and being evaluated. EDP at bedside.

## 2018-04-01 DIAGNOSIS — H2102 Hyphema, left eye: Secondary | ICD-10-CM | POA: Diagnosis not present

## 2018-04-05 DIAGNOSIS — H2102 Hyphema, left eye: Secondary | ICD-10-CM | POA: Diagnosis not present

## 2018-04-06 ENCOUNTER — Other Ambulatory Visit: Payer: Self-pay | Admitting: Neurosurgery

## 2018-04-06 DIAGNOSIS — S065X9A Traumatic subdural hemorrhage with loss of consciousness of unspecified duration, initial encounter: Secondary | ICD-10-CM

## 2018-04-06 DIAGNOSIS — S065XAA Traumatic subdural hemorrhage with loss of consciousness status unknown, initial encounter: Secondary | ICD-10-CM

## 2018-04-08 ENCOUNTER — Ambulatory Visit (INDEPENDENT_AMBULATORY_CARE_PROVIDER_SITE_OTHER): Payer: Medicare Other | Admitting: Neurology

## 2018-04-08 ENCOUNTER — Encounter: Payer: Self-pay | Admitting: Neurology

## 2018-04-08 DIAGNOSIS — R413 Other amnesia: Secondary | ICD-10-CM | POA: Diagnosis not present

## 2018-04-08 DIAGNOSIS — R269 Unspecified abnormalities of gait and mobility: Secondary | ICD-10-CM

## 2018-04-08 HISTORY — DX: Other amnesia: R41.3

## 2018-04-08 HISTORY — DX: Unspecified abnormalities of gait and mobility: R26.9

## 2018-04-08 MED ORDER — DONEPEZIL HCL 5 MG PO TABS: 5.0000 mg | ORAL_TABLET | Freq: Every day | ORAL | 1 refills | Status: DC

## 2018-04-08 NOTE — Patient Instructions (Addendum)
   We will start aricept 5 mg at night.  Begin Aricept (donepezil) at 5 mg at night for one month. If this medication is well-tolerated, please call our office and we will call in a prescription for the 10 mg tablets. Look out for side effects that may include nausea, diarrhea, weight loss, or stomach cramps. This medication will also cause a runny nose, therefore there is no need for allergy medications for this purpose.  

## 2018-04-08 NOTE — Progress Notes (Signed)
Reason for visit: Memory disturbance, gait disturbance  Referring physician: Dr. Chyrel Masson is a 72 y.o. male  History of present illness:  Brandon Fleming is a 72 year old right-handed white male with a history of active alcohol abuse.  The patient was admitted to the hospital around 26 February 2018 for detox from alcohol.  The patient checked himself out of the program, he continues to drink.  According to the wife, the patient has been slightly forgetful for at least 2 years, but his cognitive status has significantly worsened over the last 5 months.  The patient is not driving a car much at this point.  He has difficulty keeping up with finances, he is making errors.  He also has some problems with managing medications and appointments.  The patient works as a Engineer, maintenance (IT), he was unable to do the taxes this year.  He is still actively drinking, he claims he has 4 or 5 drinks daily.  He has developed some problems with walking for at least a year, he has had multiple falls over the last year with multiple episodes of head trauma.  The frequency of the falls has increased dramatically over the last 4 or 5 months.  The patient has been through physical therapy within the last 3 weeks, this was of no benefit.  He was told to use a cane or a walker, he has done neither.  He has undergone a CT scan of the brain and has evidence of subdural hygroma collections all over the right hemisphere greater than left.  He is followed through neurosurgery with Dr. Arnoldo Morale.  He also has low back pain and was told he needed back surgery, the results of the MRI of the lumbar spine are not available to me.  The patient denies any headaches or dizziness, he denies any true weakness or any numbness of the extremities.  He denies neck pain or pain down the arms.  When he walks, he may have some discomfort with a burning sensation down the back of the legs bilaterally.  He does have some urinary urgency and incontinence,  he denies problems controlling the bowels.  He is sent to this office for an evaluation.  The wife indicates that the patient does have intermittent tremors involving both upper extremities, occasionally there may be a head nod tremor.  Past Medical History:  Diagnosis Date  . Alcohol abuse   . Hyperlipidemia   . Hypertension   . Kidney damage     Past Surgical History:  Procedure Laterality Date  . KNEE SURGERY      Family History  Problem Relation Age of Onset  . CAD Father   . Anesthesia problems Neg Hx   . Broken bones Neg Hx   . Cancer Neg Hx   . Clotting disorder Neg Hx   . Collagen disease Neg Hx   . Diabetes Neg Hx   . Dislocations Neg Hx   . Osteoporosis Neg Hx   . Rheumatologic disease Neg Hx   . Scoliosis Neg Hx   . Severe sprains Neg Hx     Social history:  reports that he has quit smoking. He has never used smokeless tobacco. He reports that he drinks about 2.4 oz of alcohol per week. He reports that he does not use drugs.  Medications:  Prior to Admission medications   Medication Sig Start Date End Date Taking? Authorizing Provider  amLODipine (NORVASC) 5 MG tablet Take 5 mg by mouth  daily.  11/02/17  Yes [provider]  aspirin EC 81 MG tablet Take 81 mg by mouth daily.   Yes [provider]  atropine 1 % ophthalmic solution Place 1 drop into the left eye 2 (two) times daily.   Yes [provider]  chlordiazePOXIDE (LIBRIUM) 25 MG capsule Take 25 mg by mouth See admin instructions. Take 1 tablet (25mg ) daily for 2 weeks, then take 1 tablet (25mg ) every other day for 2 weeks, then take 1 tablet (25mg ) every third day for 2 weeks. Then stop. 03/16/18  Yes [provider]  esomeprazole (NEXIUM) 20 MG capsule Take 20 mg by mouth daily at 12 noon.   Yes [provider]  fenofibrate (TRICOR) 145 MG tablet Take 145 mg by mouth daily. 02/17/18  Yes [provider]  FLUoxetine (PROZAC) 40 MG capsule Take 40 mg by mouth  daily. 03/03/18  Yes [provider]  folic acid (FOLVITE) 1 MG tablet Take 1 mg by mouth daily.   Yes [provider]  linaclotide (LINZESS) 72 MCG capsule Take 72 mcg by mouth daily before breakfast.   Yes [provider]  lisinopril (PRINIVIL,ZESTRIL) 20 MG tablet Take 20 mg by mouth daily.  12/22/13  Yes [provider]  Multiple Vitamins-Minerals (MULTIVITAMIN WITH MINERALS) tablet Take 1 tablet by mouth daily.   Yes [provider]  oxyCODONE-acetaminophen (PERCOCET/ROXICET) 5-325 MG tablet Take 1 tablet by mouth every 6 (six) hours as needed for moderate pain.  01/29/18  Yes [provider]  prednisoLONE acetate (PRED FORTE) 1 % ophthalmic suspension 1 drop. One drop twice daily in left eye. 04/02/18  Yes [provider]  rosuvastatin (CRESTOR) 10 MG tablet Take 10 mg by mouth daily.  12/28/13  Yes [provider]  sildenafil (VIAGRA) 100 MG tablet Take 100 mg by mouth daily as needed for erectile dysfunction.   Yes [provider]  thiamine 100 MG tablet Take 100 mg by mouth daily.   Yes [provider]  timolol (BETIMOL) 0.25 % ophthalmic solution Place 1 drop into the left eye 2 (two) times daily.   Yes [provider]     No Known Allergies  ROS:  Out of a complete 14 system review of symptoms, the patient complains only of the following symptoms, and all other reviewed systems are negative.  Hearing loss Eye pain Snoring Easy bleeding Feeling cold Memory loss dizziness, tremor Sleepiness, snoring, restless legs Depression, decreased energy, suicidal thoughts  Height 5\' 6"  (1.676 m), weight 164 lb 8 oz (74.6 kg).  Physical Exam  General: The patient is alert and cooperative at the time of the examination.  Eyes: Pupils are equal, round, and reactive to light. Discs are flat bilaterally.  Neck: The neck is supple, no carotid bruits are noted.  Respiratory: The respiratory  examination is clear.  Cardiovascular: The cardiovascular examination reveals a regular rate and rhythm, no obvious murmurs or rubs are noted.  Skin: Extremities are without significant edema.  Neurologic Exam  Mental status: The patient is alert and oriented x 3 at the time of the examination. The Mini-Mental status examination done today shows a total score 25/30.  The patient is able to name 6 animals in 30 seconds.  Cranial nerves: Facial symmetry is present. There is good sensation of the face to pinprick and soft touch bilaterally. The strength of the facial muscles and the muscles to head turning and shoulder shrug are normal bilaterally. Speech is well enunciated,  no aphasia or dysarthria is noted. Extraocular movements are full. Visual fields are full. The tongue is midline, and the patient has symmetric elevation of the soft palate. No obvious hearing deficits are noted.  Motor: The motor testing reveals 5 over 5 strength of all 4 extremities. Good symmetric motor tone is noted throughout.  Sensory: Sensory testing is intact to pinprick, soft touch, vibration sensation, and position sense on all 4 extremities. No evidence of extinction is noted.  Coordination: Cerebellar testing reveals good finger-nose-finger and heel-to-shin bilaterally.  Minimal intention tremors are seen bilaterally.  Gait and station: Gait is wide-based, the patient is able to ambulate independently.  The patient has good arm swing with walking.  Tandem gait is unsteady. No drift is seen.  Reflexes: Deep tendon reflexes are symmetric and normal bilaterally. Toes are downgoing bilaterally.   CT head 03/31/18:  IMPRESSION: New low-density subdural fluid collection over the right cerebral hemisphere most compatible with chronic subdural hematoma or Hygroma.  * CT scan images were reviewed online. I agree with the written report.     Assessment/Plan:  1.  Progressive memory disturbance  2.  Gait  disturbance  3.  Alcohol overuse  4.  Low back pain  5.  Bilateral subdural hygroma  The patient appears to have had some gradual change in memory over the last 2 years.  He has developed a gait disturbance, he has fallen multiple times with head injury, he now has bilateral right greater than left subdural hygromas.  He also has back pain and leg pain.  He is being followed through neurosurgery.  The patient has continued to abuse alcohol.  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  Low back issues may be an additive factor.  I have recommended that he use a cane when outside the house.  I have recommended that he stop drinking.  We will start Aricept for the memory.  He will follow-up in 6 months.  He has had thyroid studies and vitamin B12 level done recently that were unremarkable.  Brandon Alexanders MD 04/08/2018 1:26 PM  [facility] [address] [place],  [address]  Phone [phone] Fax [phone]

## 2018-04-21 ENCOUNTER — Ambulatory Visit
Admission: RE | Admit: 2018-04-21 | Discharge: 2018-04-21 | Disposition: A | Discharge status: Home / Self Care | Payer: Medicare Other | Source: Ambulatory Visit | Attending: Neurosurgery | Admitting: Neurosurgery

## 2018-04-21 DIAGNOSIS — S065XAA Traumatic subdural hemorrhage with loss of consciousness status unknown, initial encounter: Secondary | ICD-10-CM

## 2018-04-21 DIAGNOSIS — Z6825 Body mass index (BMI) 25.0-25.9, adult: Secondary | ICD-10-CM | POA: Diagnosis not present

## 2018-04-21 DIAGNOSIS — I10 Essential (primary) hypertension: Secondary | ICD-10-CM | POA: Diagnosis not present

## 2018-04-21 DIAGNOSIS — I6200 Nontraumatic subdural hemorrhage, unspecified: Secondary | ICD-10-CM | POA: Diagnosis not present

## 2018-04-21 DIAGNOSIS — S065X9A Traumatic subdural hemorrhage with loss of consciousness of unspecified duration, initial encounter: Secondary | ICD-10-CM

## 2018-04-22 NOTE — Pre-Procedure Instructions (Signed)
DAMIANO STAMPER  04/22/2018    Your procedure is scheduled on Wednesday, May 22.  Report to [facility] Admitting at 5:30 AM                 Your surgery or procedure is scheduled for 7:30 AM    Call this number if you have problems the morning of surgery:[phone] - pre- op desk                For any other questions prior to surgery Monday - Friday, 8:00 AM - 4:00 PM, call [phone]- PAT desk, ask to speak to any nurse.    Remember:  Do not eat food or drink liquids after midnight Tuesday, May 21.  Take these medicines the morning of surgery with A SIP OF WATER: amLODipine (NORVASC) esomeprazole (NEXIUM)  FLUoxetine (PROZAC)  timolol (BETIMOL)- eye drops prednisoLONE acetate (PRED FORTE) - eye drops  1 Week prior to surgery STOP taking  Aspirin Products (Goody Powder, Excedrin Migraine), Ibuprofen (Advil), Naproxen (Aleve), Vitamins and Herbal Products (ie Fish Oil).  Special instructions:   [facility]- Preparing For Surgery  Before surgery, you can play an important role. Because skin is not sterile, your skin needs to be as free of germs as possible. You can reduce the number of germs on your skin by washing with CHG (chlorahexidine gluconate) Soap before surgery.  CHG is an antiseptic cleaner which kills germs and bonds with the skin to continue killing germs even after washing.  Oral Hygiene is also important to reduce your risk of infection.  Remember - BRUSH YOUR TEETH THE MORNING OF SURGERY  Please do not use if you have an allergy to CHG or antibacterial soaps. If your skin becomes reddened/irritated stop using the CHG.  Do not shave (including legs and underarms) for at least 48 hours prior to first CHG shower. It is OK to shave your face.  Please follow these instructions carefully.   1. Shower the NIGHT BEFORE SURGERY and the MORNING OF SURGERY with CHG.   2. If you chose to wash your hair, wash your hair first as usual with your normal  shampoo.  3. After you shampoo, rinse your hair and body thoroughly to remove the shampoo.   Wash your face and private area with the soap you use at home, then rinse.  4. Use CHG as you would any other liquid soap. You can apply CHG directly to the skin and wash gently with a scrungie or a clean washcloth.   5. Apply the CHG Soap to your body ONLY FROM THE NECK DOWN.  Do not use on open wounds or open sores. Avoid contact with your eyes, ears, mouth and genitals (private parts). Wash Face and genitals (private parts)  with your normal soap.  6. Wash thoroughly, paying special attention to the area where your surgery will be performed.  7. Thoroughly rinse your body with warm water from the neck down.  8. DO NOT shower/wash with your normal soap after using and rinsing off the CHG Soap.  9. Pat yourself dry with a CLEAN TOWEL.  10. Wear CLEAN PAJAMAS to bed the night before surgery, wear comfortable clothes the morning of surgery  11. Place CLEAN SHEETS on your bed the night of your first shower and DO NOT SLEEP WITH PETS.  Day of Surgery: Shower as above Do not apply any deodorants/lotions, powders or colognes. Please wear clean clothes to the hospital/surgery center.  Remember to brush your teeth.    Do not wear jewelry, make-up or nail polish.  Do not shave 48 hours prior to surgery.  Men may shave face and neck.  Do not bring valuables to the hospital.  [facility] is not responsible for any belongings or valuables.  Contacts, dentures or bridgework may not be worn into surgery.  Leave your suitcase in the car.  After surgery it may be brought to your room.  For patients admitted to the hospital, discharge time will be determined by your treatment team.  Patients discharged the day of surgery will not be allowed to drive home.   Please read over the following fact sheets that you were given: Pain Booklet, Coughing and Deep Breathing, MRSA Information and Surgical Site Infection  Prevention

## 2018-04-23 ENCOUNTER — Encounter (HOSPITAL_COMMUNITY)
Admission: RE | Admit: 2018-04-23 | Discharge: 2018-04-23 | Disposition: A | Discharge status: Home / Self Care | Payer: Medicare Other | Source: Ambulatory Visit | Attending: Neurosurgery | Admitting: Neurosurgery

## 2018-04-23 ENCOUNTER — Other Ambulatory Visit: Payer: Self-pay

## 2018-04-23 ENCOUNTER — Encounter (HOSPITAL_COMMUNITY): Payer: Self-pay

## 2018-04-23 DIAGNOSIS — Z01812 Encounter for preprocedural laboratory examination: Secondary | ICD-10-CM | POA: Diagnosis not present

## 2018-04-23 DIAGNOSIS — Z0183 Encounter for blood typing: Secondary | ICD-10-CM | POA: Insufficient documentation

## 2018-04-23 DIAGNOSIS — H2102 Hyphema, left eye: Secondary | ICD-10-CM | POA: Diagnosis not present

## 2018-04-23 LAB — CBC
HEMATOCRIT: 38.2 % — AB (ref 39.0–52.0)
HEMOGLOBIN: 12.7 g/dL — AB (ref 13.0–17.0)
MCH: 32 pg (ref 26.0–34.0)
MCHC: 33.2 g/dL (ref 30.0–36.0)
MCV: 96.2 fL (ref 78.0–100.0)
Platelets: 202 10*3/uL (ref 150–400)
RBC: 3.97 MIL/uL — ABNORMAL LOW (ref 4.22–5.81)
RDW: 13.7 % (ref 11.5–15.5)
WBC: 3.8 10*3/uL — ABNORMAL LOW (ref 4.0–10.5)

## 2018-04-23 LAB — COMPREHENSIVE METABOLIC PANEL
ALK PHOS: 48 U/L (ref 38–126)
ALT: 26 U/L (ref 17–63)
ANION GAP: 11 (ref 5–15)
AST: 47 U/L — ABNORMAL HIGH (ref 15–41)
Albumin: 4.1 g/dL (ref 3.5–5.0)
BUN: 12 mg/dL (ref 6–20)
CALCIUM: 9.4 mg/dL (ref 8.9–10.3)
CO2: 24 mmol/L (ref 22–32)
Chloride: 111 mmol/L (ref 101–111)
Creatinine, Ser: 1.22 mg/dL (ref 0.61–1.24)
GFR calc Af Amer: >60 mL/min (ref >60)
GFR calc non Af Amer: 58 mL/min — ABNORMAL LOW (ref >60)
GLUCOSE: 117 mg/dL — AB (ref 65–99)
Potassium: 3.7 mmol/L (ref 3.5–5.1)
SODIUM: 146 mmol/L — AB (ref 135–145)
Total Bilirubin: 0.6 mg/dL (ref 0.3–1.2)
Total Protein: 7 g/dL (ref 6.5–8.1)

## 2018-04-23 LAB — SURGICAL PCR SCREEN
MRSA, PCR: NEGATIVE
Staphylococcus aureus: POSITIVE — AB

## 2018-04-23 LAB — ABO/RH: ABO/RH(D): A NEG

## 2018-04-23 LAB — TYPE AND SCREEN
ABO/RH(D): A NEG
Antibody Screen: NEGATIVE

## 2018-04-23 NOTE — Progress Notes (Signed)
As I was walking with Brandon Fleming to PAT interview room, patient said,"Just take me out and shoot me."  When he arrive to interview room, I asked him to explain, "just what I said".  I asked if he was referring to the pain, he shurged his shoulders. I asked him if the pain medication is helping any, patient's wife said, "he is not taking it, he is not taking any medication."  I asked patient why, "he said why should I?"   I spoke with patient about optimizing his health prior to surgery, by taking his medications, eating right, slowing down on alcohol intake. Patient just shook his head.  I asked patient if he was depressed, he said he used to run every day and play golf, but now he can't do those things.  I asked patient if taking his Prozac help, patient said 'I would be better off not here any longer.'  Brandon Fleming reported that he does not have a plan to harm himself or others. Brandon Fleming reports that patient has been depressed but stopped taking any medication 1 week ago. I paged the ED social worker and if she would see him before he leaves today.  Patient became agaited when he heard his wife tell me that he will stay in the hospital 3 days and then go to a rehab center for rehab. Ptient has some memory issues, he has seen Dr Jannifer Franklin, was just given a prescription for Aricept, but has not taken it.  Patient continued to curse that he is not staying in the hospital or going to rehab.  Brandon Fleming spoke with SW regarding having patient involuntary committed. SW explained involuntary committed to Brandon Fleming.

## 2018-04-29 NOTE — Progress Notes (Signed)
Brandon Fleming left a message for me on Wednesday to tell me change in medication.  I called her and said that she was going to take eye drops off, but patient had a "flare" and has started back on eyedrops. Patient is taking his medication and is not drinking.

## 2018-05-03 ENCOUNTER — Encounter (INDEPENDENT_AMBULATORY_CARE_PROVIDER_SITE_OTHER): Payer: Medicare Other | Admitting: Ophthalmology

## 2018-05-03 DIAGNOSIS — I10 Essential (primary) hypertension: Secondary | ICD-10-CM | POA: Diagnosis not present

## 2018-05-03 DIAGNOSIS — H33301 Unspecified retinal break, right eye: Secondary | ICD-10-CM | POA: Diagnosis not present

## 2018-05-03 DIAGNOSIS — H35033 Hypertensive retinopathy, bilateral: Secondary | ICD-10-CM

## 2018-05-03 DIAGNOSIS — T8522XA Displacement of intraocular lens, initial encounter: Secondary | ICD-10-CM | POA: Diagnosis not present

## 2018-05-03 DIAGNOSIS — H353111 Nonexudative age-related macular degeneration, right eye, early dry stage: Secondary | ICD-10-CM

## 2018-05-03 DIAGNOSIS — H43813 Vitreous degeneration, bilateral: Secondary | ICD-10-CM | POA: Diagnosis not present

## 2018-05-04 ENCOUNTER — Other Ambulatory Visit: Payer: Self-pay | Admitting: Neurosurgery

## 2018-05-05 ENCOUNTER — Inpatient Hospital Stay (HOSPITAL_COMMUNITY)
Admission: RE | Disposition: A | Discharge status: Home / Self Care | Payer: Self-pay | Source: Ambulatory Visit | Attending: Neurosurgery

## 2018-05-05 ENCOUNTER — Inpatient Hospital Stay (HOSPITAL_COMMUNITY): Payer: Medicare Other | Admitting: Vascular Surgery

## 2018-05-05 ENCOUNTER — Inpatient Hospital Stay (HOSPITAL_COMMUNITY): Payer: Medicare Other

## 2018-05-05 ENCOUNTER — Encounter (HOSPITAL_COMMUNITY): Payer: Self-pay | Admitting: *Deleted

## 2018-05-05 ENCOUNTER — Inpatient Hospital Stay (HOSPITAL_COMMUNITY)
Admission: RE | Admit: 2018-05-05 | Discharge: 2018-05-06 | DRG: 455 | Disposition: A | Discharge status: Home / Self Care | Payer: Medicare Other | Source: Ambulatory Visit | Attending: Neurosurgery | Admitting: Neurosurgery

## 2018-05-05 ENCOUNTER — Inpatient Hospital Stay (HOSPITAL_COMMUNITY): Payer: Medicare Other | Admitting: Anesthesiology

## 2018-05-05 DIAGNOSIS — M4316 Spondylolisthesis, lumbar region: Secondary | ICD-10-CM | POA: Diagnosis present

## 2018-05-05 DIAGNOSIS — Z841 Family history of disorders of kidney and ureter: Secondary | ICD-10-CM | POA: Diagnosis not present

## 2018-05-05 DIAGNOSIS — Z8249 Family history of ischemic heart disease and other diseases of the circulatory system: Secondary | ICD-10-CM

## 2018-05-05 DIAGNOSIS — F0390 Unspecified dementia without behavioral disturbance: Secondary | ICD-10-CM | POA: Diagnosis present

## 2018-05-05 DIAGNOSIS — I10 Essential (primary) hypertension: Secondary | ICD-10-CM | POA: Diagnosis present

## 2018-05-05 DIAGNOSIS — M4326 Fusion of spine, lumbar region: Secondary | ICD-10-CM | POA: Diagnosis not present

## 2018-05-05 DIAGNOSIS — M5116 Intervertebral disc disorders with radiculopathy, lumbar region: Secondary | ICD-10-CM | POA: Diagnosis present

## 2018-05-05 DIAGNOSIS — M5136 Other intervertebral disc degeneration, lumbar region: Secondary | ICD-10-CM | POA: Diagnosis not present

## 2018-05-05 DIAGNOSIS — M48062 Spinal stenosis, lumbar region with neurogenic claudication: Principal | ICD-10-CM | POA: Diagnosis present

## 2018-05-05 DIAGNOSIS — T8522XS Displacement of intraocular lens, sequela: Secondary | ICD-10-CM

## 2018-05-05 DIAGNOSIS — R269 Unspecified abnormalities of gait and mobility: Secondary | ICD-10-CM | POA: Diagnosis not present

## 2018-05-05 DIAGNOSIS — Z419 Encounter for procedure for purposes other than remedying health state, unspecified: Secondary | ICD-10-CM

## 2018-05-05 DIAGNOSIS — Z820 Family history of epilepsy and other diseases of the nervous system: Secondary | ICD-10-CM | POA: Diagnosis not present

## 2018-05-05 DIAGNOSIS — E785 Hyperlipidemia, unspecified: Secondary | ICD-10-CM | POA: Diagnosis present

## 2018-05-05 DIAGNOSIS — Z981 Arthrodesis status: Secondary | ICD-10-CM | POA: Diagnosis not present

## 2018-05-05 DIAGNOSIS — Z87891 Personal history of nicotine dependence: Secondary | ICD-10-CM | POA: Diagnosis not present

## 2018-05-05 SURGERY — POSTERIOR LUMBAR FUSION 1 LEVEL
Anesthesia: General

## 2018-05-05 MED ORDER — EPHEDRINE SULFATE 50 MG/ML IJ SOLN
INTRAMUSCULAR | Status: DC | PRN
  Administered 2018-05-05: 10 mg via INTRAVENOUS
  Administered 2018-05-05: 5 mg via INTRAVENOUS
  Administered 2018-05-05: 10 mg via INTRAVENOUS

## 2018-05-05 MED ORDER — ROSUVASTATIN CALCIUM 5 MG PO TABS
10.0000 mg | ORAL_TABLET | Freq: Every day | ORAL | Status: DC
  Filled 2018-05-05: qty 2

## 2018-05-05 MED ORDER — 0.9 % SODIUM CHLORIDE (POUR BTL) OPTIME
TOPICAL | Status: DC | PRN
  Administered 2018-05-05: 1000 mL

## 2018-05-05 MED ORDER — CHLORHEXIDINE GLUCONATE CLOTH 2 % EX PADS: 6.0000 | MEDICATED_PAD | Freq: Once | CUTANEOUS | Status: DC

## 2018-05-05 MED ORDER — FOLIC ACID 1 MG PO TABS
1.0000 mg | ORAL_TABLET | Freq: Every day | ORAL | Status: DC
  Administered 2018-05-06: 1 mg via ORAL
  Filled 2018-05-05: qty 1

## 2018-05-05 MED ORDER — FOLIC ACID 1 MG PO TABS: 1.0000 mg | ORAL_TABLET | Freq: Every day | ORAL | Status: DC

## 2018-05-05 MED ORDER — ONDANSETRON HCL 4 MG/2ML IJ SOLN: 4.0000 mg | Freq: Four times a day (QID) | INTRAMUSCULAR | Status: DC | PRN

## 2018-05-05 MED ORDER — ACETAMINOPHEN 650 MG RE SUPP: 650.0000 mg | RECTAL | Status: DC | PRN

## 2018-05-05 MED ORDER — LISINOPRIL 20 MG PO TABS
20.0000 mg | ORAL_TABLET | Freq: Every day | ORAL | Status: DC
  Administered 2018-05-06: 20 mg via ORAL
  Filled 2018-05-05: qty 1

## 2018-05-05 MED ORDER — FENTANYL CITRATE (PF) 250 MCG/5ML IJ SOLN
INTRAMUSCULAR | Status: AC
  Filled 2018-05-05: qty 5

## 2018-05-05 MED ORDER — TACROLIMUS 0.1 % EX OINT: 1.0000 "application " | TOPICAL_OINTMENT | Freq: Two times a day (BID) | CUTANEOUS | Status: DC

## 2018-05-05 MED ORDER — CEFAZOLIN SODIUM-DEXTROSE 2-4 GM/100ML-% IV SOLN
2.0000 g | INTRAVENOUS | Status: AC
  Administered 2018-05-05: 2 g via INTRAVENOUS
  Filled 2018-05-05: qty 100

## 2018-05-05 MED ORDER — OXYCODONE HCL 5 MG PO TABS
10.0000 mg | ORAL_TABLET | ORAL | Status: DC | PRN
  Administered 2018-05-05 – 2018-05-06 (×3): 10 mg via ORAL
  Filled 2018-05-05 (×3): qty 2

## 2018-05-05 MED ORDER — VANCOMYCIN HCL 1000 MG IV SOLR
INTRAVENOUS | Status: AC
  Filled 2018-05-05: qty 1000

## 2018-05-05 MED ORDER — AMLODIPINE BESYLATE 5 MG PO TABS
5.0000 mg | ORAL_TABLET | Freq: Every day | ORAL | Status: DC
  Administered 2018-05-06: 5 mg via ORAL
  Filled 2018-05-05: qty 1

## 2018-05-05 MED ORDER — BACITRACIN ZINC 500 UNIT/GM EX OINT
TOPICAL_OINTMENT | CUTANEOUS | Status: AC
  Filled 2018-05-05: qty 28.35

## 2018-05-05 MED ORDER — THROMBIN (RECOMBINANT) 20000 UNITS EX SOLR
CUTANEOUS | Status: DC | PRN
  Administered 2018-05-05: 09:00:00 via TOPICAL

## 2018-05-05 MED ORDER — VITAMIN B-1 100 MG PO TABS
100.0000 mg | ORAL_TABLET | Freq: Every day | ORAL | Status: DC
  Administered 2018-05-05 – 2018-05-06 (×2): 100 mg via ORAL
  Filled 2018-05-05 (×2): qty 1

## 2018-05-05 MED ORDER — FENTANYL CITRATE (PF) 100 MCG/2ML IJ SOLN
INTRAMUSCULAR | Status: DC | PRN
  Administered 2018-05-05: 50 ug via INTRAVENOUS
  Administered 2018-05-05: 100 ug via INTRAVENOUS
  Administered 2018-05-05 (×2): 50 ug via INTRAVENOUS

## 2018-05-05 MED ORDER — PROPOFOL 10 MG/ML IV BOLUS
INTRAVENOUS | Status: DC | PRN
  Administered 2018-05-05: 100 mg via INTRAVENOUS

## 2018-05-05 MED ORDER — THROMBIN (RECOMBINANT) 5000 UNITS EX SOLR
OROMUCOSAL | Status: DC | PRN
  Administered 2018-05-05 (×2): via TOPICAL

## 2018-05-05 MED ORDER — ONDANSETRON HCL 4 MG PO TABS: 4.0000 mg | ORAL_TABLET | Freq: Four times a day (QID) | ORAL | Status: DC | PRN

## 2018-05-05 MED ORDER — TIMOLOL MALEATE 0.25 % OP SOLN
1.0000 [drp] | Freq: Two times a day (BID) | OPHTHALMIC | Status: DC
  Filled 2018-05-05: qty 5

## 2018-05-05 MED ORDER — CYCLOBENZAPRINE HCL 10 MG PO TABS
10.0000 mg | ORAL_TABLET | Freq: Three times a day (TID) | ORAL | Status: DC | PRN
  Administered 2018-05-05 – 2018-05-06 (×3): 10 mg via ORAL
  Filled 2018-05-05 (×2): qty 1

## 2018-05-05 MED ORDER — PHENYLEPHRINE HCL 10 MG/ML IJ SOLN
INTRAMUSCULAR | Status: DC | PRN
  Administered 2018-05-05 (×4): 80 ug via INTRAVENOUS

## 2018-05-05 MED ORDER — BISACODYL 10 MG RE SUPP: 10.0000 mg | Freq: Every day | RECTAL | Status: DC | PRN

## 2018-05-05 MED ORDER — PROMETHAZINE HCL 25 MG/ML IJ SOLN: 6.2500 mg | INTRAMUSCULAR | Status: DC | PRN

## 2018-05-05 MED ORDER — LORAZEPAM 2 MG/ML IJ SOLN: 1.0000 mg | Freq: Four times a day (QID) | INTRAMUSCULAR | Status: DC | PRN

## 2018-05-05 MED ORDER — ADULT MULTIVITAMIN W/MINERALS CH
1.0000 | ORAL_TABLET | Freq: Every day | ORAL | Status: DC
  Administered 2018-05-06: 1 via ORAL
  Filled 2018-05-05: qty 1

## 2018-05-05 MED ORDER — ADULT MULTIVITAMIN W/MINERALS CH: 1.0000 | ORAL_TABLET | Freq: Every day | ORAL | Status: DC

## 2018-05-05 MED ORDER — DONEPEZIL HCL 5 MG PO TABS
5.0000 mg | ORAL_TABLET | Freq: Every day | ORAL | Status: DC
  Administered 2018-05-06: 5 mg via ORAL
  Filled 2018-05-05: qty 1

## 2018-05-05 MED ORDER — THIAMINE HCL 100 MG PO TABS: 100.0000 mg | ORAL_TABLET | Freq: Every day | ORAL | Status: DC

## 2018-05-05 MED ORDER — PROPOFOL 10 MG/ML IV BOLUS
INTRAVENOUS | Status: AC
  Filled 2018-05-05: qty 20

## 2018-05-05 MED ORDER — VANCOMYCIN HCL 1 G IV SOLR
INTRAVENOUS | Status: DC | PRN
  Administered 2018-05-05: 1000 mg

## 2018-05-05 MED ORDER — PANTOPRAZOLE SODIUM 40 MG PO TBEC
40.0000 mg | DELAYED_RELEASE_TABLET | Freq: Every day | ORAL | Status: DC
Start: 2018-05-06 — End: 2018-05-06
  Administered 2018-05-06: 40 mg via ORAL
  Filled 2018-05-05: qty 1

## 2018-05-05 MED ORDER — ROCURONIUM BROMIDE 100 MG/10ML IV SOLN
INTRAVENOUS | Status: DC | PRN
  Administered 2018-05-05: 40 mg via INTRAVENOUS
  Administered 2018-05-05: 60 mg via INTRAVENOUS

## 2018-05-05 MED ORDER — BUPIVACAINE LIPOSOME 1.3 % IJ SUSP
20.0000 mL | INTRAMUSCULAR | Status: DC
  Filled 2018-05-05: qty 20

## 2018-05-05 MED ORDER — FLUOXETINE HCL 20 MG PO CAPS
40.0000 mg | ORAL_CAPSULE | Freq: Every day | ORAL | Status: DC
  Administered 2018-05-06: 40 mg via ORAL
  Filled 2018-05-05: qty 2

## 2018-05-05 MED ORDER — THROMBIN 20000 UNITS EX SOLR
CUTANEOUS | Status: AC
  Filled 2018-05-05: qty 20000

## 2018-05-05 MED ORDER — BUPIVACAINE-EPINEPHRINE (PF) 0.5% -1:200000 IJ SOLN
INTRAMUSCULAR | Status: DC | PRN
  Administered 2018-05-05: 10 mL via PERINEURAL

## 2018-05-05 MED ORDER — MENTHOL 3 MG MT LOZG: 1.0000 | LOZENGE | OROMUCOSAL | Status: DC | PRN

## 2018-05-05 MED ORDER — OXYCODONE HCL 5 MG PO TABS
5.0000 mg | ORAL_TABLET | ORAL | Status: DC | PRN
  Administered 2018-05-05: 5 mg via ORAL
  Filled 2018-05-05: qty 1

## 2018-05-05 MED ORDER — SODIUM CHLORIDE 0.9% FLUSH: 3.0000 mL | INTRAVENOUS | Status: DC | PRN

## 2018-05-05 MED ORDER — CYCLOBENZAPRINE HCL 10 MG PO TABS
ORAL_TABLET | ORAL | Status: AC
  Filled 2018-05-05: qty 1

## 2018-05-05 MED ORDER — CEFAZOLIN SODIUM-DEXTROSE 2-4 GM/100ML-% IV SOLN
2.0000 g | Freq: Three times a day (TID) | INTRAVENOUS | Status: AC
  Administered 2018-05-05 (×2): 2 g via INTRAVENOUS
  Filled 2018-05-05 (×2): qty 100

## 2018-05-05 MED ORDER — LIDOCAINE HCL (CARDIAC) PF 100 MG/5ML IV SOSY
PREFILLED_SYRINGE | INTRAVENOUS | Status: DC | PRN
  Administered 2018-05-05: 60 mg via INTRAVENOUS

## 2018-05-05 MED ORDER — THIAMINE HCL 100 MG/ML IJ SOLN
100.0000 mg | Freq: Every day | INTRAMUSCULAR | Status: DC
  Filled 2018-05-05 (×2): qty 1

## 2018-05-05 MED ORDER — THROMBIN 5000 UNITS EX SOLR
CUTANEOUS | Status: AC
  Filled 2018-05-05: qty 5000

## 2018-05-05 MED ORDER — DEXAMETHASONE SODIUM PHOSPHATE 4 MG/ML IJ SOLN
INTRAMUSCULAR | Status: DC | PRN
  Administered 2018-05-05: 10 mg via INTRAVENOUS

## 2018-05-05 MED ORDER — ONDANSETRON HCL 4 MG/2ML IJ SOLN
INTRAMUSCULAR | Status: DC | PRN
  Administered 2018-05-05: 4 mg via INTRAVENOUS

## 2018-05-05 MED ORDER — LORAZEPAM 0.5 MG PO TABS
1.0000 mg | ORAL_TABLET | Freq: Four times a day (QID) | ORAL | Status: DC | PRN
Start: 2018-05-05 — End: 2018-05-06

## 2018-05-05 MED ORDER — DOCUSATE SODIUM 100 MG PO CAPS
100.0000 mg | ORAL_CAPSULE | Freq: Two times a day (BID) | ORAL | Status: DC
  Administered 2018-05-05 – 2018-05-06 (×3): 100 mg via ORAL
  Filled 2018-05-05 (×3): qty 1

## 2018-05-05 MED ORDER — FENOFIBRATE 160 MG PO TABS
160.0000 mg | ORAL_TABLET | Freq: Every day | ORAL | Status: DC
Start: 2018-05-06 — End: 2018-05-06
  Administered 2018-05-06: 160 mg via ORAL
  Filled 2018-05-05: qty 1

## 2018-05-05 MED ORDER — PREDNISOLONE ACETATE 1 % OP SUSP
1.0000 [drp] | Freq: Two times a day (BID) | OPHTHALMIC | Status: DC
  Filled 2018-05-05: qty 5

## 2018-05-05 MED ORDER — PSYLLIUM 95 % PO PACK: 1.0000 | PACK | Freq: Every day | ORAL | Status: DC | PRN

## 2018-05-05 MED ORDER — HYDROMORPHONE HCL 2 MG/ML IJ SOLN
INTRAMUSCULAR | Status: AC
  Administered 2018-05-05: 0.5 mg via INTRAVENOUS
  Filled 2018-05-05: qty 1

## 2018-05-05 MED ORDER — PHENOL 1.4 % MT LIQD: 1.0000 | OROMUCOSAL | Status: DC | PRN

## 2018-05-05 MED ORDER — SODIUM CHLORIDE 0.9% FLUSH
3.0000 mL | Freq: Two times a day (BID) | INTRAVENOUS | Status: DC
  Administered 2018-05-05 (×2): 3 mL via INTRAVENOUS

## 2018-05-05 MED ORDER — HYDROMORPHONE HCL 2 MG/ML IJ SOLN
0.3000 mg | INTRAMUSCULAR | Status: DC | PRN
  Administered 2018-05-05 (×2): 0.5 mg via INTRAVENOUS

## 2018-05-05 MED ORDER — LACTATED RINGERS IV SOLN
INTRAVENOUS | Status: DC | PRN
  Administered 2018-05-05 (×2): via INTRAVENOUS

## 2018-05-05 MED ORDER — MORPHINE SULFATE (PF) 4 MG/ML IV SOLN: 4.0000 mg | INTRAVENOUS | Status: DC | PRN

## 2018-05-05 MED ORDER — SODIUM CHLORIDE 0.9 % IV SOLN
INTRAVENOUS | Status: DC | PRN
  Administered 2018-05-05: 07:00:00

## 2018-05-05 MED ORDER — LINACLOTIDE 72 MCG PO CAPS: 72.0000 ug | ORAL_CAPSULE | Freq: Every day | ORAL | Status: DC | PRN

## 2018-05-05 MED ORDER — BUPIVACAINE-EPINEPHRINE (PF) 0.5% -1:200000 IJ SOLN
INTRAMUSCULAR | Status: AC
  Filled 2018-05-05: qty 30

## 2018-05-05 MED ORDER — ACETAMINOPHEN 325 MG PO TABS: 650.0000 mg | ORAL_TABLET | ORAL | Status: DC | PRN

## 2018-05-05 MED ORDER — MIDAZOLAM HCL 2 MG/2ML IJ SOLN
INTRAMUSCULAR | Status: AC
  Filled 2018-05-05: qty 2

## 2018-05-05 MED ORDER — SUGAMMADEX SODIUM 200 MG/2ML IV SOLN
INTRAVENOUS | Status: DC | PRN
  Administered 2018-05-05: 200 mg via INTRAVENOUS

## 2018-05-05 MED ORDER — ACETAMINOPHEN 500 MG PO TABS
1000.0000 mg | ORAL_TABLET | Freq: Four times a day (QID) | ORAL | Status: AC
  Administered 2018-05-05 – 2018-05-06 (×2): 1000 mg via ORAL
  Filled 2018-05-05 (×3): qty 2

## 2018-05-05 SURGICAL SUPPLY — 67 items
APL SKNCLS STERI-STRIP NONHPOA (GAUZE/BANDAGES/DRESSINGS) ×1
BAG DECANTER FOR FLEXI CONT (MISCELLANEOUS) ×2 IMPLANT
BASKET BONE COLLECTION (BASKET) ×1 IMPLANT
BENZOIN TINCTURE PRP APPL 2/3 (GAUZE/BANDAGES/DRESSINGS) ×2 IMPLANT
BLADE CLIPPER SURG (BLADE) IMPLANT
BUR MATCHSTICK NEURO 3.0 LAGG (BURR) ×2 IMPLANT
BUR PRECISION FLUTE 6.0 (BURR) ×2 IMPLANT
CANISTER SUCT 3000ML PPV (MISCELLANEOUS) ×2 IMPLANT
CAP REVERE LOCKING (Cap) ×4 IMPLANT
CARTRIDGE OIL MAESTRO DRILL (MISCELLANEOUS) ×1 IMPLANT
CONT SPEC 4OZ CLIKSEAL STRL BL (MISCELLANEOUS) ×2 IMPLANT
COVER BACK TABLE 60X90IN (DRAPES) ×2 IMPLANT
DECANTER SPIKE VIAL GLASS SM (MISCELLANEOUS) ×2 IMPLANT
DIFFUSER DRILL AIR PNEUMATIC (MISCELLANEOUS) ×2 IMPLANT
DRAPE C-ARM 42X72 X-RAY (DRAPES) ×5 IMPLANT
DRAPE HALF SHEET 40X57 (DRAPES) ×3 IMPLANT
DRAPE LAPAROTOMY 100X72X124 (DRAPES) ×2 IMPLANT
DRAPE SURG 17X23 STRL (DRAPES) ×8 IMPLANT
DRSG OPSITE POSTOP 4X6 (GAUZE/BANDAGES/DRESSINGS) ×2 IMPLANT
ELECT BLADE 4.0 EZ CLEAN MEGAD (MISCELLANEOUS) ×2
ELECT REM PT RETURN 9FT ADLT (ELECTROSURGICAL) ×2
ELECTRODE BLDE 4.0 EZ CLN MEGD (MISCELLANEOUS) ×1 IMPLANT
ELECTRODE REM PT RTRN 9FT ADLT (ELECTROSURGICAL) ×1 IMPLANT
EVACUATOR 1/8 PVC DRAIN (DRAIN) IMPLANT
GAUZE SPONGE 4X4 12PLY STRL (GAUZE/BANDAGES/DRESSINGS) ×2 IMPLANT
GAUZE SPONGE 4X4 12PLY STRL LF (GAUZE/BANDAGES/DRESSINGS) ×2 IMPLANT
GAUZE SPONGE 4X4 16PLY XRAY LF (GAUZE/BANDAGES/DRESSINGS) ×2 IMPLANT
GLOVE BIO SURGEON STRL SZ8 (GLOVE) ×4 IMPLANT
GLOVE BIO SURGEON STRL SZ8.5 (GLOVE) ×4 IMPLANT
GLOVE EXAM NITRILE LRG STRL (GLOVE) IMPLANT
GLOVE EXAM NITRILE XL STR (GLOVE) IMPLANT
GLOVE EXAM NITRILE XS STR PU (GLOVE) IMPLANT
GOWN STRL REUS W/ TWL LRG LVL3 (GOWN DISPOSABLE) IMPLANT
GOWN STRL REUS W/ TWL XL LVL3 (GOWN DISPOSABLE) ×2 IMPLANT
GOWN STRL REUS W/TWL 2XL LVL3 (GOWN DISPOSABLE) IMPLANT
GOWN STRL REUS W/TWL LRG LVL3 (GOWN DISPOSABLE)
GOWN STRL REUS W/TWL XL LVL3 (GOWN DISPOSABLE) ×4
HEMOSTAT POWDER KIT SURGIFOAM (HEMOSTASIS) ×2 IMPLANT
HEMOSTAT POWDER SURGIFOAM 1G (HEMOSTASIS) ×2 IMPLANT
KIT BASIN OR (CUSTOM PROCEDURE TRAY) ×2 IMPLANT
KIT TURNOVER KIT B (KITS) ×2 IMPLANT
MILL MEDIUM DISP (BLADE) ×2 IMPLANT
NDL HYPO 21X1.5 SAFETY (NEEDLE) IMPLANT
NEEDLE HYPO 21X1.5 SAFETY (NEEDLE) ×2 IMPLANT
NEEDLE HYPO 22GX1.5 SAFETY (NEEDLE) ×2 IMPLANT
NS IRRIG 1000ML POUR BTL (IV SOLUTION) ×2 IMPLANT
OIL CARTRIDGE MAESTRO DRILL (MISCELLANEOUS) ×2
PACK LAMINECTOMY NEURO (CUSTOM PROCEDURE TRAY) ×2 IMPLANT
PAD ARMBOARD 7.5X6 YLW CONV (MISCELLANEOUS) ×6 IMPLANT
PATTIES SURGICAL .5 X1 (DISPOSABLE) IMPLANT
ROD REVERE 6.35 40MM (Rod) ×1 IMPLANT
ROD REVERE 6.35 45MM (Rod) ×2 IMPLANT
SCREW 7.5X50MM (Screw) ×4 IMPLANT
SPACER ALTERA 10X31-15 (Spacer) ×1 IMPLANT
SPONGE LAP 4X18 X RAY DECT (DISPOSABLE) IMPLANT
SPONGE NEURO XRAY DETECT 1X3 (DISPOSABLE) IMPLANT
SPONGE SURGIFOAM ABS GEL 100 (HEMOSTASIS) ×1 IMPLANT
STRIP BIOACTIVE 20CC 25X100X8 (Miscellaneous) ×2 IMPLANT
STRIP CLOSURE SKIN 1/2X4 (GAUZE/BANDAGES/DRESSINGS) ×2 IMPLANT
SUT VIC AB 1 CT1 18XBRD ANBCTR (SUTURE) ×2 IMPLANT
SUT VIC AB 1 CT1 8-18 (SUTURE) ×4
SUT VIC AB 2-0 CP2 18 (SUTURE) ×4 IMPLANT
SYR 20CC LL (SYRINGE) ×1 IMPLANT
TOWEL GREEN STERILE (TOWEL DISPOSABLE) ×2 IMPLANT
TOWEL GREEN STERILE FF (TOWEL DISPOSABLE) ×2 IMPLANT
TRAY FOLEY MTR SLVR 16FR STAT (SET/KITS/TRAYS/PACK) ×2 IMPLANT
WATER STERILE IRR 1000ML POUR (IV SOLUTION) ×2 IMPLANT

## 2018-05-05 NOTE — H&P (Signed)
Subjective: The patient is a 72 year old white male with a small right subdural hematoma and mild dementia who has complained of back and leg pain.  He has failed medical management.  He was worked up with lumbar x-rays a lumbar MRI which demonstrated a spinal listhesis and severe spinal stenosis at L4-5 with more moderate stenosis at L3-4.  I discussed the various treatment options with him here.  He has decided to proceed with surgery.  Past Medical History:  Diagnosis Date  . Alcohol abuse   . Gait abnormality 04/08/2018  . Hyperlipidemia   . Hypertension   . Kidney damage   . Memory difficulties 04/08/2018    Past Surgical History:  Procedure Laterality Date  . COLONOSCOPY     no polyps  . COLONOSCOPY W/ POLYPECTOMY    . KNEE SURGERY Left 2011   fracture    Allergies  Allergen Reactions  . Atropine Swelling and Other (See Comments)    Eyes swollen and red    Social History   Tobacco Use  . Smoking status: Former Smoker    Types: Cigarettes    Last attempt to quit: 1978    Years since quitting: 41.4  . Smokeless tobacco: Never Used  Substance Use Topics  . Alcohol use: Yes    Comment: 8-10 ounces daily    Family History  Problem Relation Age of Onset  . CAD Father   . Kidney disease Father   . Parkinson's disease Mother   . Anesthesia problems Neg Hx   . Broken bones Neg Hx   . Cancer Neg Hx   . Clotting disorder Neg Hx   . Collagen disease Neg Hx   . Diabetes Neg Hx   . Dislocations Neg Hx   . Osteoporosis Neg Hx   . Rheumatologic disease Neg Hx   . Scoliosis Neg Hx   . Severe sprains Neg Hx    Prior to Admission medications   Medication Sig Start Date End Date Taking? Authorizing Provider  amLODipine (NORVASC) 5 MG tablet Take 5 mg by mouth daily.  11/02/17  Yes [provider]  aspirin EC 81 MG tablet Take 81 mg by mouth daily.   Yes [provider]  atropine 1 % ophthalmic solution Place 1 drop into the left eye 2 (two) times daily.    Yes [provider]  donepezil (ARICEPT) 5 MG tablet Take 1 tablet (5 mg total) by mouth at bedtime. Patient taking differently: Take 5 mg by mouth every morning.  04/08/18  Yes Kathrynn Ducking, MD  esomeprazole (NEXIUM) 20 MG capsule Take 20 mg by mouth daily at 12 noon.   Yes [provider]  fenofibrate (TRICOR) 145 MG tablet Take 145 mg by mouth daily. 02/17/18  Yes [provider]  FLUoxetine (PROZAC) 40 MG capsule Take 40 mg by mouth daily. 03/03/18  Yes [provider]  folic acid (FOLVITE) 1 MG tablet Take 1 mg by mouth daily.   Yes [provider]  linaclotide (LINZESS) 72 MCG capsule Take 72 mcg by mouth daily as needed (symptoms).    Yes [provider]  lisinopril (PRINIVIL,ZESTRIL) 20 MG tablet Take 20 mg by mouth daily.  12/22/13  Yes [provider]  Multiple Vitamins-Minerals (MULTIVITAMIN WITH MINERALS) tablet Take 1 tablet by mouth daily.   Yes [provider]  prednisoLONE acetate (PRED FORTE) 1 % ophthalmic suspension Place 1 drop into the left eye 2 (two) times daily.  04/02/18  Yes [provider]  rosuvastatin (CRESTOR) 10 MG tablet Take 10 mg by mouth daily.  12/28/13  Yes [provider]  sildenafil (VIAGRA) 100 MG tablet Take 100 mg by mouth daily as needed for erectile dysfunction.   Yes [provider]  tacrolimus (PROTOPIC) 0.1 % ointment Apply 1 application topically 2 (two) times daily.   Yes [provider]  thiamine 100 MG tablet Take 100 mg by mouth daily.   Yes [provider]  timolol (BETIMOL) 0.25 % ophthalmic solution Place 1 drop into the left eye 2 (two) times daily.   Yes [provider]  psyllium (REGULOID) 0.52 g capsule Take 0.52 g by mouth daily as needed (constipation).    [provider]     Review of Systems  Positive ROS: As above  All other systems have been reviewed and were otherwise negative with the exception of  those mentioned in the HPI and as above.  Objective: Vital signs in last 24 hours: Temp:  [98.5 F (36.9 C)] 98.5 F (36.9 C) (05/22 0541) Pulse Rate:  [71] 71 (05/22 0541) Resp:  [20] 20 (05/22 0541) BP: (171)/(86) 171/86 (05/22 0541) SpO2:  [100 %] 100 % (05/22 0541) Weight:  [75.3 kg (166 lb)] 75.3 kg (166 lb) (05/22 0541) Estimated body mass index is 26.79 kg/m as calculated from the following:   Height as of 04/23/18: 5\' 6"  (1.676 m).   Weight as of this encounter: 75.3 kg (166 lb).   General Appearance: Alert Head: Normocephalic, without obvious abnormality, atraumatic Eyes: PERRL, conjunctiva/corneas clear, EOM's intact,    Ears: Normal  Throat: Normal  Neck: Supple, Back: unremarkable Lungs: Clear to auscultation bilaterally, respirations unlabored Heart: Regular rate and rhythm, no murmur, rub or gallop Abdomen: Soft, non-tender Extremities: Extremities normal, atraumatic, no cyanosis or edema Skin: unremarkable  NEUROLOGIC:   Mental status: alert and oriented,Motor Exam - grossly normal Sensory Exam - grossly normal Reflexes:  Coordination - grossly normal Gait - grossly normal Balance - grossly normal Cranial Nerves: I: smell Not tested  II: visual acuity  OS: Normal  OD: Normal   II: visual fields Full to confrontation  II: pupils Equal, round, reactive to light  III,VII: ptosis None  III,IV,VI: extraocular muscles  Full ROM  V: mastication Normal  V: facial light touch sensation  Normal  V,VII: corneal reflex  Present  VII: facial muscle function - upper  Normal  VII: facial muscle function - lower Normal  VIII: hearing Not tested  IX: soft palate elevation  Normal  IX,X: gag reflex Present  XI: trapezius strength  5/5  XI: sternocleidomastoid strength 5/5  XI: neck flexion strength  5/5  XII: tongue strength  Normal    Data Review Lab Results  Component Value Date   WBC 3.8 (L) 04/23/2018   HGB 12.7 (L) 04/23/2018   HCT 38.2 (L) 04/23/2018    MCV 96.2 04/23/2018   PLT 202 04/23/2018   Lab Results  Component Value Date   NA 146 (H) 04/23/2018   K 3.7 04/23/2018   CL 111 04/23/2018   CO2 24 04/23/2018   BUN 12 04/23/2018   CREATININE 1.22 04/23/2018   GLUCOSE 117 (H) 04/23/2018   Lab Results  Component Value Date   INR 1.06 03/31/2018    Assessment/Plan: Lumbar spinal listhesis, lumbar spinal stenosis, lumbago, lumbar radiculopathy, neurogenic claudication: I have discussed the situation with the patient and his wife.  I have reviewed his imaging studies with him and pointed out the  abnormalities.  We have discussed the various treatment options including surgery.  I have described the surgical treatment option of an L3 l laminotomies with L4-5 decompression instrumentation and fusion.  I have shown him surgical models.  I have given him a surgical pamphlet.  We have discussed the risks, benefits, alternatives, expected postoperative course, and likelihood of achieving our goals with surgery.  I have answered all his questions.  He has decided to proceed with surgery.   Ophelia Charter 05/05/2018 7:17 AM

## 2018-05-05 NOTE — Transfer of Care (Signed)
Immediate Anesthesia Transfer of Care Note  Patient: Brandon Fleming  Procedure(s) Performed: POSTERIOR LUMBAR INTERBODY FUSION, INSTRUMENTATION, FUSION LUMBAR FOUR- LUMBAR FIVE LAMINECTOMY LUMBAR THREE LUMBAR FOUR (N/A )  Patient Location: PACU  Anesthesia Type:General  Level of Consciousness: drowsy  Airway & Oxygen Therapy: Patient Spontanous Breathing and Patient connected to nasal cannula oxygen  Post-op Assessment: Report given to RN, Post -op Vital signs reviewed and stable and Patient moving all extremities  Post vital signs: Reviewed and stable  Last Vitals:  Vitals Value Taken Time  BP 124/72 05/05/2018 11:27 AM  Temp 36.5 C 05/05/2018 11:27 AM  Pulse 73 05/05/2018 11:29 AM  Resp 16 05/05/2018 11:29 AM  SpO2 100 % 05/05/2018 11:29 AM  Vitals shown include unvalidated device data.  Last Pain:  Vitals:   05/05/18 0642  TempSrc:   PainSc: 0-No pain         Complications: No apparent anesthesia complications

## 2018-05-05 NOTE — Anesthesia Postprocedure Evaluation (Signed)
Anesthesia Post Note  Patient: Brandon Fleming  Procedure(s) Performed: POSTERIOR LUMBAR INTERBODY FUSION, INSTRUMENTATION, FUSION LUMBAR FOUR- LUMBAR FIVE LAMINECTOMY LUMBAR THREE LUMBAR FOUR (N/A )     Patient location during evaluation: PACU Anesthesia Type: General Level of consciousness: awake and alert Pain management: pain level controlled Vital Signs Assessment: post-procedure vital signs reviewed and stable Respiratory status: spontaneous breathing, nonlabored ventilation, respiratory function stable and patient connected to nasal cannula oxygen Cardiovascular status: blood pressure returned to baseline and stable Postop Assessment: no apparent nausea or vomiting Anesthetic complications: no    Last Vitals:  Vitals:   05/05/18 1246 05/05/18 1303  BP: 134/73 136/83  Pulse: 65 66  Resp: 12 17  Temp:  36.7 C  SpO2: 100% 100%    Last Pain:  Vitals:   05/05/18 1303  TempSrc: Oral  PainSc:                  Theodore Virgin S

## 2018-05-05 NOTE — Anesthesia Procedure Notes (Addendum)
Procedure Name: Intubation Date/Time: 05/05/2018 7:35 AM Performed by: Daxtin Leiker T, CRNA Pre-anesthesia Checklist: Patient identified, Emergency Drugs available, Suction available and Patient being monitored Patient Re-evaluated:Patient Re-evaluated prior to induction Oxygen Delivery Method: Circle system utilized Preoxygenation: Pre-oxygenation with 100% oxygen Induction Type: IV induction and Cricoid Pressure applied Ventilation: Mask ventilation without difficulty Laryngoscope Size: Miller and 3 Grade View: Grade I Tube type: Oral Tube size: 7.5 mm Number of attempts: 1 Airway Equipment and Method: Patient positioned with wedge pillow and Stylet Secured at: 22 cm Tube secured with: Tape Dental Injury: Teeth and Oropharynx as per pre-operative assessment

## 2018-05-05 NOTE — Op Note (Signed)
Brief history: The patient is a 72 year old white male who has complained of back and leg pain consistent with neurogenic claudication/lumbar radiculopathy.  He has failed medical management and was worked up with lumbar x-rays and a lumbar MRI.  This demonstrated an L4-5 spondylolisthesis with severe facet arthropathy and spinal stenosis.  He had more moderate stenosis at L3-4.  I discussed the various treatment option with the patient.  He has weighed the risks, benefits, and alternatives surgery and decided proceed with a lumbar decompression, instrumentation, and fusion.  Preoperative diagnosis: L4-5 spondylolisthesis, degenerative disc disease, L3-4 and L4-5 spinal stenosis compressing L4 and L5 nerve roots; lumbago; lumbar radiculopathy; neurogenic claudication  Postoperative diagnosis: The same  Procedure: Lateral L3-4 laminotomy/foraminotomies and L4-5 laminectomy to decompress the bilateral L4 and L5 nerve roots(the work required to do this was in addition to the work required to do the posterior lumbar interbody fusion because of the patient's spinal stenosis, facet arthropathy. Etc. requiring a wide decompression of the nerve roots.);  L4-5 transforaminal lumbar interbody fusion with local morselized autograft bone and Kinnex graft extender; insertion of interbody prosthesis at L4-5 (globus peek expandable interbody prosthesis); posterior nonsegmental instrumentation from L4 to L5 with globus titanium pedicle screws and rods; posterior lateral arthrodesis at L4-5 bilaterally with local morselized autograft bone and Kinnex bone graft extender.  Surgeon: Dr. Earle Gell  Asst.: Dr. Sherley Bounds  Anesthesia: Gen. endotracheal  Estimated blood loss: 250 cc  Drains: None  Complications: None  Description of procedure: The patient was brought to the operating room by the anesthesia team. General endotracheal anesthesia was induced. The patient was turned to the prone position on the Wilson  frame. The patient's lumbosacral region was then prepared with Betadine scrub and Betadine solution. Sterile drapes were applied.  I then injected the area to be incised with Marcaine with epinephrine solution. I then used the scalpel to make a linear midline incision over the L3-4 and L4-5 interspace. I then used electrocautery to perform a bilateral subperiosteal dissection exposing the spinous process and lamina of L3, L4 and L5. We then obtained intraoperative radiograph to confirm our location. We then inserted the Verstrac retractor to provide exposure.  I began the decompression by using the high speed drill to perform laminotomies at L3-4 and L4-5 bilaterally. We then used the Kerrison punches to complete the L4 laminectomy and to widen the bilateral laminotomies at L3-4 and removed the ligamentum flavum at L3-4 and L4-5. We used the Kerrison punches to remove the medial facets at L3-4 and L4-5. We performed wide foraminotomies about the bilateral L4 and L5 nerve roots completing the decompression.  We now turned our attention to the posterior lumbar interbody fusion. I used a scalpel to incise the intervertebral disc at L4-5 bilaterally. I then performed a partial intervertebral discectomy at L4-5 bilaterally using the pituitary forceps. We prepared the vertebral endplates at G3-1 bilaterally for the fusion by removing the soft tissues with the curettes. We then used the trial spacers to pick the appropriate sized interbody prosthesis. We prefilled his prosthesis with a combination of local morselized autograft bone that we obtained during the decompression as well as Kinnex bone graft extender. We inserted the prefilled prosthesis into the interspace at L4-5 from the right, we then expanded the prosthesis. There was a good snug fit of the prosthesis in the interspace. We then filled and the remainder of the intervertebral disc space with local morselized autograft bone and Kinnex. This completed the  posterior lumbar  interbody arthrodesis.  We now turned attention to the instrumentation. Under fluoroscopic guidance we cannulated the bilateral L4 and L5 pedicles with the bone probe. We then removed the bone probe. We then tapped the pedicle with a 6.5 millimeter tap. We then removed the tap. We probed inside the tapped pedicle with a ball probe to rule out cortical breaches. We then inserted a 7.5 x 50 millimeter pedicle screw into the L4 and L5 pedicles bilaterally under fluoroscopic guidance. We then palpated along the medial aspect of the pedicles to rule out cortical breaches. There were none. The nerve roots were not injured. We then connected the unilateral pedicle screws with a lordotic rod. We compressed the construct and secured the rod in place with the caps. We then tightened the caps appropriately. This completed the instrumentation from L4-5 bilaterally.  We now turned our attention to the posterior lateral arthrodesis at L4-5 bilaterally. We used the high-speed drill to decorticate the remainder of the facets, pars, transverse process at L4-5 bilaterally. We then applied a combination of local morselized autograft bone and Kinnex bone graft extender over these decorticated posterior lateral structures. This completed the posterior lateral arthrodesis.  We then obtained hemostasis using bipolar electrocautery. We irrigated the wound out with bacitracin solution. We inspected the thecal sac and nerve roots and noted they were well decompressed. We then removed the retractor. We placed vancomycin powder in the wound. We reapproximated patient's thoracolumbar fascia with interrupted #1 Vicryl suture. We reapproximated patient's subcutaneous tissue with interrupted 2-0 Vicryl suture. The reapproximated patient's skin with Steri-Strips and benzoin. The wound was then coated with bacitracin ointment. A sterile dressing was applied. The drapes were removed. The patient was subsequently returned to the  supine position where they were extubated by the anesthesia team. He was then transported to the post anesthesia care unit in stable condition. All sponge instrument and needle counts were reportedly correct at the end of this case.

## 2018-05-05 NOTE — Progress Notes (Signed)
Subjective: I saw the patient in recovery room.  He was alert and pleasant and in no apparent distress.  Objective: Vital signs in last 24 hours: Temp:  [97.5 F (36.4 C)-98.5 F (36.9 C)] 98 F (36.7 C) (05/22 1303) Pulse Rate:  [63-73] 66 (05/22 1303) Resp:  [12-20] 17 (05/22 1303) BP: (122-171)/(69-86) 136/83 (05/22 1303) SpO2:  [98 %-100 %] 100 % (05/22 1303) Weight:  [75.3 kg (166 lb)] 75.3 kg (166 lb) (05/22 0541) Estimated body mass index is 26.79 kg/m as calculated from the following:   Height as of 04/23/18: 5\' 6"  (1.676 m).   Weight as of this encounter: 75.3 kg (166 lb).   Intake/Output from previous day: No intake/output data recorded. Intake/Output this shift: Total I/O In: 2240 [P.O.:240; I.V.:1900; Other:100] Out: 400 [Urine:200; Blood:200]  Physical exam the patient is alert and pleasant.  He moves his lower extremities well.  Lab Results: No results for input(s): WBC, HGB, HCT, PLT in the last 72 hours. BMET No results for input(s): NA, K, CL, CO2, GLUCOSE, BUN, CREATININE, CALCIUM in the last 72 hours.  Studies/Results: Dg Lumbar Spine 2-3 Views  Result Date: 05/05/2018 CLINICAL DATA:  Status post surgical posterior fusion of L4-5. EXAM: LUMBAR SPINE - 2-3 VIEW; DG C-ARM 61-120 MIN FLUOROSCOPY TIME:  21 seconds. COMPARISON:  Radiograph of same day. FINDINGS: Two intraoperative fluoroscopic images of lower lumbar spine demonstrate the patient be status post bilateral intrapedicular screw placement of L4 and L5 with interbody fusion. IMPRESSION: Surgical posterior fusion of L4-5. Electronically Signed   By: Marijo Conception, M.D.   On: 05/05/2018 11:01   Dg Lumbar Spine 1 View  Result Date: 05/05/2018 CLINICAL DATA:  L4-5 PLIF. EXAM: LUMBAR SPINE - 1 VIEW COMPARISON:  MR lumbar spine 01/26/2018. FINDINGS: A single intraoperative cross-table lateral view of the lumbar spine is submitted. Numbering system utilized on 01/26/2018 is preserved. Surgical instrument  tip projects posterior to the L4-5 facet joints. Multilevel endplate degenerative changes and facet hypertrophy. Surgical retractors are in place. IMPRESSION: Intraoperative localization at L4-5. Electronically Signed   By: Lorin Picket M.D.   On: 05/05/2018 10:15   Dg C-arm 1-60 Min  Result Date: 05/05/2018 CLINICAL DATA:  Status post surgical posterior fusion of L4-5. EXAM: LUMBAR SPINE - 2-3 VIEW; DG C-ARM 61-120 MIN FLUOROSCOPY TIME:  21 seconds. COMPARISON:  Radiograph of same day. FINDINGS: Two intraoperative fluoroscopic images of lower lumbar spine demonstrate the patient be status post bilateral intrapedicular screw placement of L4 and L5 with interbody fusion. IMPRESSION: Surgical posterior fusion of L4-5. Electronically Signed   By: Marijo Conception, M.D.   On: 05/05/2018 11:01    Assessment/Plan: The patient is doing well.  I spoke with the patient's wife.  She stated the patient drinks a lot.  I will put him on the CIWA protocol.  LOS: 0 days     Ophelia Charter 05/05/2018, 3:04 PM

## 2018-05-05 NOTE — H&P (Signed)
Brandon Fleming is an 72 y.o. male.   Chief Complaint: episodes of sudden vision loss OS HPI: complicated dislocation of cataract where footplates are eroding the sulcus and causing recurrent hyphemas OS  Past Medical History:  Diagnosis Date  . Alcohol abuse   . Gait abnormality 04/08/2018  . Hyperlipidemia   . Hypertension   . Kidney damage   . Memory difficulties 04/08/2018    Past Surgical History:  Procedure Laterality Date  . COLONOSCOPY     no polyps  . COLONOSCOPY W/ POLYPECTOMY    . KNEE SURGERY Left 2011   fracture    Family History  Problem Relation Age of Onset  . CAD Father   . Kidney disease Father   . Parkinson's disease Mother   . Anesthesia problems Neg Hx   . Broken bones Neg Hx   . Cancer Neg Hx   . Clotting disorder Neg Hx   . Collagen disease Neg Hx   . Diabetes Neg Hx   . Dislocations Neg Hx   . Osteoporosis Neg Hx   . Rheumatologic disease Neg Hx   . Scoliosis Neg Hx   . Severe sprains Neg Hx    Social History:  reports that he quit smoking about 41 years ago. His smoking use included cigarettes. He has never used smokeless tobacco. He reports that he drinks alcohol. He reports that he does not use drugs.  Allergies:  Allergies  Allergen Reactions  . Atropine Swelling and Other (See Comments)    Eyes swollen and red    Medications Prior to Admission  Medication Sig Dispense Refill  . amLODipine (NORVASC) 5 MG tablet Take 5 mg by mouth daily.     [name] Kitchen aspirin EC 81 MG tablet Take 81 mg by mouth daily.    [name] Kitchen atropine 1 % ophthalmic solution Place 1 drop into the left eye 2 (two) times daily.    [name] Kitchen donepezil (ARICEPT) 5 MG tablet Take 1 tablet (5 mg total) by mouth at bedtime. (Patient taking differently: Take 5 mg by mouth every morning. ) 30 tablet 1  . esomeprazole (NEXIUM) 20 MG capsule Take 20 mg by mouth daily at 12 noon.    . fenofibrate (TRICOR) 145 MG tablet Take 145 mg by mouth daily.    [name] Kitchen FLUoxetine (PROZAC) 40 MG capsule Take 40 mg by  mouth daily.    . folic acid (FOLVITE) 1 MG tablet Take 1 mg by mouth daily.    [name] Kitchen linaclotide (LINZESS) 72 MCG capsule Take 72 mcg by mouth daily as needed (symptoms).     [name] Kitchen lisinopril (PRINIVIL,ZESTRIL) 20 MG tablet Take 20 mg by mouth daily.     . Multiple Vitamins-Minerals (MULTIVITAMIN WITH MINERALS) tablet Take 1 tablet by mouth daily.    . prednisoLONE acetate (PRED FORTE) 1 % ophthalmic suspension Place 1 drop into the left eye 2 (two) times daily.     . rosuvastatin (CRESTOR) 10 MG tablet Take 10 mg by mouth daily.     . sildenafil (VIAGRA) 100 MG tablet Take 100 mg by mouth daily as needed for erectile dysfunction.    . tacrolimus (PROTOPIC) 0.1 % ointment Apply 1 application topically 2 (two) times daily.    [name] Kitchen thiamine 100 MG tablet Take 100 mg by mouth daily.    . timolol (BETIMOL) 0.25 % ophthalmic solution Place 1 drop into the left eye 2 (two) times daily.    . psyllium (REGULOID) 0.52 g capsule Take 0.52 g by mouth daily  as needed (constipation).      Review of systems otherwise negative  There were no vitals taken for this visit.  Physical exam: Mental status: oriented x3. Eyes: See eye exam associated with this date of surgery in media tab.  Scanned in by scanning center Ears, Nose, Throat: within normal limits Neck: Within Normal limits General: within normal limits Chest: Within normal limits Breast: deferred Heart: Within normal limits Abdomen: Within normal limits GU: deferred Extremities: within normal limits Skin: within normal limits  Assessment/Plan Dislocated IOL with footplate erosion and hyphemas left eye Plan: To [facility] for Pars plana vitrectomy, removal of existing intraocular lens and footplates.  Placement of secondary intraocular lens with suture.  Laser and gas. Left eye

## 2018-05-05 NOTE — Anesthesia Preprocedure Evaluation (Signed)
Anesthesia Evaluation  Patient identified by MRN, date of birth, ID band Patient awake    Reviewed: Allergy & Precautions, NPO status , Patient's Chart, lab work & pertinent test results  Airway Mallampati: II  TM Distance: >3 FB Neck ROM: Full    Dental no notable dental hx.    Pulmonary neg pulmonary ROS, former smoker,    Pulmonary exam normal breath sounds clear to auscultation       Cardiovascular hypertension, Normal cardiovascular exam Rhythm:Regular Rate:Normal     Neuro/Psych negative neurological ROS  negative psych ROS   GI/Hepatic negative GI ROS, (+)     substance abuse  alcohol use,   Endo/Other  negative endocrine ROS  Renal/GU negative Renal ROS  negative genitourinary   Musculoskeletal negative musculoskeletal ROS (+)   Abdominal   Peds negative pediatric ROS (+)  Hematology negative hematology ROS (+)   Anesthesia Other Findings   Reproductive/Obstetrics negative OB ROS                             Anesthesia Physical Anesthesia Plan  ASA: III  Anesthesia Plan: General   Post-op Pain Management:    Induction: Intravenous  PONV Risk Score and Plan: 2 and Ondansetron, Dexamethasone and Treatment may vary due to age or medical condition  Airway Management Planned: Oral ETT  Additional Equipment:   Intra-op Plan:   Post-operative Plan: Extubation in OR  Informed Consent: I have reviewed the patients History and Physical, chart, labs and discussed the procedure including the risks, benefits and alternatives for the proposed anesthesia with the patient or authorized representative who has indicated his/her understanding and acceptance.   Dental advisory given  Plan Discussed with: CRNA and Surgeon  Anesthesia Plan Comments:         Anesthesia Quick Evaluation

## 2018-05-06 ENCOUNTER — Other Ambulatory Visit: Payer: Self-pay

## 2018-05-06 LAB — BASIC METABOLIC PANEL
ANION GAP: 9 (ref 5–15)
BUN: 17 mg/dL (ref 6–20)
CALCIUM: 8 mg/dL — AB (ref 8.9–10.3)
CO2: 25 mmol/L (ref 22–32)
Chloride: 107 mmol/L (ref 101–111)
Creatinine, Ser: 1.18 mg/dL (ref 0.61–1.24)
Glucose, Bld: 122 mg/dL — ABNORMAL HIGH (ref 65–99)
Potassium: 3.4 mmol/L — ABNORMAL LOW (ref 3.5–5.1)
Sodium: 141 mmol/L (ref 135–145)

## 2018-05-06 LAB — CBC
HEMATOCRIT: 29.5 % — AB (ref 39.0–52.0)
Hemoglobin: 9.8 g/dL — ABNORMAL LOW (ref 13.0–17.0)
MCH: 32.1 pg (ref 26.0–34.0)
MCHC: 33.2 g/dL (ref 30.0–36.0)
MCV: 96.7 fL (ref 78.0–100.0)
PLATELETS: 192 10*3/uL (ref 150–400)
RBC: 3.05 MIL/uL — AB (ref 4.22–5.81)
RDW: 12.5 % (ref 11.5–15.5)
WBC: 6.4 10*3/uL (ref 4.0–10.5)

## 2018-05-06 MED ORDER — OXYCODONE HCL 5 MG PO TABS: 5.0000 mg | ORAL_TABLET | ORAL | 0 refills | Status: DC | PRN

## 2018-05-06 MED ORDER — DOCUSATE SODIUM 100 MG PO CAPS: 100.0000 mg | ORAL_CAPSULE | Freq: Two times a day (BID) | ORAL | 0 refills | Status: DC

## 2018-05-06 MED ORDER — CYCLOBENZAPRINE HCL 10 MG PO TABS: 10.0000 mg | ORAL_TABLET | Freq: Three times a day (TID) | ORAL | 0 refills | Status: DC | PRN

## 2018-05-06 MED FILL — Thrombin For Soln 5000 Unit: CUTANEOUS | Qty: 5000 | Status: AC

## 2018-05-06 MED FILL — Gelatin Absorbable MT Powder: OROMUCOSAL | Qty: 1 | Status: AC

## 2018-05-06 MED FILL — Thrombin For Soln 20000 Unit: CUTANEOUS | Qty: 1 | Status: AC

## 2018-05-06 NOTE — Progress Notes (Signed)
Occupational Therapy Treatment and Discharge Patient Details Name: Brandon Fleming MRN: [id number] DOB: 1946/08/03 Today's Date: 05/06/2018    History of present illness Pt is a 72 y/o male who presents s/p L3-L5 PLIF with brace. PMH significant for HTN, HLD, tremors and ETOH abuse   OT comments  This 72 yo male admitted and underwent above seen today for a second session with wife present to go over toilet tranfers, tub transfers with 3n1, dressing, and brace application. No further OT needs, we will D/C from acute OT.   Follow Up Recommendations  No OT follow up;Supervision/Assistance - 24 hour    Equipment Recommendations  3 in 1 bedside commode       Precautions / Restrictions Precautions Precautions: Fall;Back Precaution Booklet Issued: Yes (comment) Precaution Comments: Reviewed handout in detail. Pt was cued for precautions during functional mobility. Can have brace off for showering, in bed, and quick trips to bathroom Required Braces or Orthoses: Spinal Brace Spinal Brace: Lumbar corset;Applied in sitting position Restrictions Weight Bearing Restrictions: No       Mobility Bed Mobility       General bed mobility comments: pt up in room walking around with brace on and wife in room  Transfers Overall transfer level: Needs assistance Equipment used: (see comments below) Transfers: Sit to/from Stand Sit to Stand: Supervision         General transfer comment: use SPC (hurry cane) first and this seemed to make his balance worse, so switched to RW    Balance Overall balance assessment: Needs assistance Sitting-balance support: Feet supported;No upper extremity supported Sitting balance-Leahy Scale: Good     Standing balance support: No upper extremity supported Standing balance-Leahy Scale: Poor Standing balance comment: reliant on RW for balance, SPC (hurry cane) made balance worse                           ADL either performed or assessed  with clinical judgement   ADL                                         General ADL Comments: Educated pt and wife on use of 2 cups for brushing teeth, not sitting more than 20-30 minutes at a time (up to 45 minutes per order in chart) but can then get up and walk around do not need to lay back down, sequence of dressing, doffing and donning brace, using 3n1 as seat in tub--side stepping into tub     Vision Patient Visual Report: Central vision impairment            Cognition Arousal/Alertness: Awake/alert Behavior During Therapy: Flat affect Overall Cognitive Status: Within Functional Limits for tasks assessed                                 General Comments: Did need extra time and cues to remember how to don brace and adjust it                   Pertinent Vitals/ Pain       Pain Assessment: Faces Faces Pain Scale: Hurts little more Pain Location: Back Pain Descriptors / Indicators: Operative site guarding Pain Intervention(s): Monitored during session     Prior Functioning/Environment Level of Independence: Independent  Comments: independent with ADLs and IADLs and very active per wife   Frequency  Min 2X/week        Progress Toward Goals  OT Goals(current goals can now be found in the care plan section)  Progress towards OT goals: Goals met/education completed, patient discharged from OT  Acute Rehab OT Goals Patient Stated Goal: Home today OT Goal Formulation: With patient Time For Goal Achievement: 05/13/18 Potential to Achieve Goals: Good ADL Goals Pt Will Perform Lower Body Dressing: with caregiver independent in assisting Pt Will Transfer to Toilet: with supervision;ambulating;bedside commode Pt Will Perform Toileting - Clothing Manipulation and hygiene: with supervision;sit to/from stand Pt Will Perform Tub/Shower Transfer: with caregiver independent in assisting Additional ADL Goal #1: Pt will be able to  doff and don brace with S  Plan Discharge plan remains appropriate       AM-PAC PT "6 Clicks" Daily Activity     Outcome Measure   Help from another person eating meals?: None Help from another person taking care of personal grooming?: A Little Help from another person toileting, which includes using toliet, bedpan, or urinal?: A Little Help from another person bathing (including washing, rinsing, drying)?: A Little Help from another person to put on and taking off regular upper body clothing?: A Little Help from another person to put on and taking off regular lower body clothing?: A Little 6 Click Score: 19    End of Session Equipment Utilized During Treatment: Back brace  OT Visit Diagnosis: Unsteadiness on feet (R26.81);Pain Pain - part of body: (back)   Activity Tolerance Patient tolerated treatment well   Patient Left (walking in hallway with wife with RW)   Nurse Communication (Pt needs HHPT due to balance issues without AD)        Time: 1224-4975 OT Time Calculation (min): 27 min  Charges: OT General Charges $OT Visit: 1 Visit OT Evaluation $OT Eval Moderate Complexity: 1 Mod OT Treatments $Self Care/Home Management : 23-37 mins Golden Circle, OTR/L [phone] 05/06/2018

## 2018-05-06 NOTE — Discharge Summary (Signed)
Physician Discharge Summary  Patient ID: Brandon Fleming MRN: [id number] DOB/AGE: Feb 22, 1946 72 y.o.  Admit date: 05/05/2018 Discharge date: 05/06/2018  Admission Diagnoses: Lumbar spondylolisthesis, lumbar spinal stenosis, lumbar radiculopathy, lumbago, neurogenic claudication  Discharge Diagnoses: The same Active Problems:   Spondylolisthesis of lumbar region   Discharged Condition: good  Hospital Course: I performed an L4-5 instrumentation and fusion with decompression L3-4 and L4-5 on 05/05/2018.  The surgery went well.  The patient's postoperative course was unremarkable.  On postoperative day #1 he looked well and requested discharge to home.  He was given written and oral discharge instructions.  All his questions were answered.  Consults: Physical therapy Significant Diagnostic Studies: None Treatments: L3-4 and L4-5 laminectomy/laminotomy, L4-5 decompression, instrumentation and fusion. Discharge Exam: Blood pressure 125/68, pulse 65, temperature 98.9 F (37.2 C), temperature source Oral, resp. rate 16, weight 75.3 kg (166 lb), SpO2 99 %. The patient is alert and pleasant.  His strength is grossly normal in his lower extremities.  Disposition: Home  Discharge Instructions    Call MD for:  difficulty breathing, headache or visual disturbances   Complete by:  As directed    Call MD for:  extreme fatigue   Complete by:  As directed    Call MD for:  hives   Complete by:  As directed    Call MD for:  persistant dizziness or light-headedness   Complete by:  As directed    Call MD for:  persistant nausea and vomiting   Complete by:  As directed    Call MD for:  redness, tenderness, or signs of infection (pain, swelling, redness, odor or green/yellow discharge around incision site)   Complete by:  As directed    Call MD for:  severe uncontrolled pain   Complete by:  As directed    Call MD for:  temperature >100.4   Complete by:  As directed    Diet - low sodium heart  healthy   Complete by:  As directed    Discharge instructions   Complete by:  As directed    Call [phone] for a followup appointment. Take a stool softener while you are using pain medications.   Driving Restrictions   Complete by:  As directed    Do not drive for 2 weeks.   Increase activity slowly   Complete by:  As directed    Lifting restrictions   Complete by:  As directed    Do not lift more than 5 pounds. No excessive bending or twisting.   May shower / Bathe   Complete by:  As directed    Remove the dressing for 3 days after surgery.  You may shower, but leave the incision alone.   Remove dressing in 48 hours   Complete by:  As directed    Your stitches are under the scan and will dissolve by themselves. The Steri-Strips will fall off after you take a few showers. Do not rub back or pick at the wound, Leave the wound alone.     Allergies as of 05/06/2018      Reactions   Atropine Swelling, Other (See Comments)   Eyes swollen and red      Medication List    TAKE these medications   amLODipine 5 MG tablet Commonly known as:  NORVASC Take 5 mg by mouth daily.   aspirin EC 81 MG tablet Take 81 mg by mouth daily.   atropine 1 % ophthalmic solution Place 1 drop into the  left eye 2 (two) times daily.   CRESTOR 10 MG tablet Generic drug:  rosuvastatin Take 10 mg by mouth daily.   cyclobenzaprine 10 MG tablet Commonly known as:  FLEXERIL Take 1 tablet (10 mg total) by mouth 3 (three) times daily as needed for muscle spasms.   docusate sodium 100 MG capsule Commonly known as:  COLACE Take 1 capsule (100 mg total) by mouth 2 (two) times daily.   donepezil 5 MG tablet Commonly known as:  ARICEPT Take 1 tablet (5 mg total) by mouth at bedtime. What changed:  when to take this   esomeprazole 20 MG capsule Commonly known as:  NEXIUM Take 20 mg by mouth daily at 12 noon.   fenofibrate 145 MG tablet Commonly known as:  TRICOR Take 145 mg by mouth daily.    FLUoxetine 40 MG capsule Commonly known as:  PROZAC Take 40 mg by mouth daily.   folic acid 1 MG tablet Commonly known as:  FOLVITE Take 1 mg by mouth daily.   LINZESS 72 MCG capsule Generic drug:  linaclotide Take 72 mcg by mouth daily as needed (symptoms).   lisinopril 20 MG tablet Commonly known as:  PRINIVIL,ZESTRIL Take 20 mg by mouth daily.   multivitamin with minerals tablet Take 1 tablet by mouth daily.   oxyCODONE 5 MG immediate release tablet Commonly known as:  Oxy IR/ROXICODONE Take 1 tablet (5 mg total) by mouth every 4 (four) hours as needed for moderate pain ((score 4 to 6)).   prednisoLONE acetate 1 % ophthalmic suspension Commonly known as:  PRED FORTE Place 1 drop into the left eye 2 (two) times daily.   psyllium 0.52 g capsule Commonly known as:  REGULOID Take 0.52 g by mouth daily as needed (constipation).   sildenafil 100 MG tablet Commonly known as:  VIAGRA Take 100 mg by mouth daily as needed for erectile dysfunction.   tacrolimus 0.1 % ointment Commonly known as:  PROTOPIC Apply 1 application topically 2 (two) times daily.   thiamine 100 MG tablet Take 100 mg by mouth daily.   timolol 0.25 % ophthalmic solution Commonly known as:  BETIMOL Place 1 drop into the left eye 2 (two) times daily.        Signed: Ophelia Charter 05/06/2018, 7:58 AM

## 2018-05-06 NOTE — Progress Notes (Signed)
Patient is discharged from room 3C05 at this time. Alert and in stable condition. IV site d/c'd and instructions read to patient and spouse with understanding verbalized. Left unit via wheelchair with all belongings at side. 

## 2018-05-06 NOTE — Care Management Note (Signed)
Case Management Note  Patient Details  Name: Brandon Fleming MRN: [id number] Date of Birth: 1946-04-24  Subjective/Objective:  72 yr old gentleman s/p  L3-L5 PLIF.                Action/Plan: Case manager spoke with patient concerning discharge plan. Choice for [place] was offered, patient's wife says they have used [facility] in the past and wish to do so now. Referral was called to Jonelle Sidle, [facility]. Patient will have family support at discharge.     Expected Discharge Date:  05/06/18               Expected Discharge Plan:  [place]  In-House Referral:  NA  Discharge planning Services  CM Consult  Post Acute Care Choice:  Home Health, Durable Medical Equipment Choice offered to:  Patient  DME Arranged:  3-N-1 DME Agency:  [place]:  PT [place]:  [facility]  Status of Service:  Completed, signed off  If discussed at [employer] of Stay Meetings, dates discussed:    Additional Comments:  Ninfa Meeker, RN 05/06/2018, 10:36 AM

## 2018-05-06 NOTE — Evaluation (Signed)
Physical Therapy Evaluation Patient Details Name: Brandon Fleming MRN: [id number] DOB: June 18, 1946 Today's Date: 05/06/2018   History of Present Illness  Pt is a 72 y/o male who presents s/p L3-L5 PLIF with brace. PMH significant for HTN, HLD, tremors and ETOH abuse  Clinical Impression  Pt admitted with above diagnosis. Pt currently with functional limitations due to the deficits listed below (see PT Problem List). At the time of PT eval pt was able to perform transfers and ambulation with gross min guard to min assist for balance support and safety. Pt unsteady at times and demonstrated several losses of balance as he fatigued during gait training. Recommend trying a SPC for safety next session. Pt will benefit from skilled PT to increase their independence and safety with mobility to allow discharge to the venue listed below.       Follow Up Recommendations No PT follow up;Supervision for mobility/OOB    Equipment Recommendations  None recommended by PT    Recommendations for Other Services       Precautions / Restrictions Precautions Precautions: Fall;Back Precaution Booklet Issued: Yes (comment) Precaution Comments: Reviewed handout in detail. Pt was cued for precautions during functional mobility.  Required Braces or Orthoses: Spinal Brace Spinal Brace: Lumbar corset;Applied in sitting position Restrictions Weight Bearing Restrictions: No      Mobility  Bed Mobility Overal bed mobility: Needs Assistance Bed Mobility: Rolling;Sidelying to Sit Rolling: Min guard Sidelying to sit: Supervision       General bed mobility comments: Tactile cues/min guard for rolling as he was attempting to sit up at the same time. VC's for proper log roll technique.   Transfers Overall transfer level: Needs assistance Equipment used: None Transfers: Sit to/from Stand Sit to Stand: Min guard         General transfer comment: Close guard for safety as pt powered up to full stand. No  unsteadiness or LOB noted.   Ambulation/Gait Ambulation/Gait assistance: Min assist Ambulation Distance (Feet): 300 Feet Assistive device: None Gait Pattern/deviations: Step-through pattern;Decreased stride length;Trunk flexed Gait velocity: Decreased Gait velocity interpretation: <1.31 ft/sec, indicative of household ambulator General Gait Details: VC's for improved posture and general safety. Pt had 2 larger losses of balance and several minor losses of balance. Min assist required to recover.   Stairs Stairs: Yes Stairs assistance: Min guard Stair Management: One rail Right;Step to pattern;Forwards Number of Stairs: 3 General stair comments: VC's for sequencing and general safety.   Wheelchair Mobility    Modified Rankin (Stroke Patients Only)       Balance Overall balance assessment: Needs assistance Sitting-balance support: Feet supported;No upper extremity supported Sitting balance-Leahy Scale: Fair     Standing balance support: No upper extremity supported Standing balance-Leahy Scale: Poor Standing balance comment: Occasional min assist required                             Pertinent Vitals/Pain Pain Assessment: Faces Faces Pain Scale: Hurts a little bit Pain Location: Back Pain Descriptors / Indicators: Operative site guarding Pain Intervention(s): Monitored during session    Home Living Family/patient expects to be discharged to:: Private residence Living Arrangements: Alone Available Help at Discharge: Family Type of Home: House Home Access: Stairs to enter   Technical brewer of Steps: 1 Home Layout: Two level;Able to live on main level with bedroom/bathroom Home Equipment: Gilford Rile - 2 wheels;Cane - single point      Prior Function Level of Independence: Independent  Hand Dominance   Dominant Hand: Right    Extremity/Trunk Assessment   Upper Extremity Assessment Upper Extremity Assessment: Defer to OT  evaluation    Lower Extremity Assessment Lower Extremity Assessment: RLE deficits/detail RLE Deficits / Details: Decreased strength consistent with pre-op diagnosis    Cervical / Trunk Assessment Cervical / Trunk Assessment: Other exceptions Cervical / Trunk Exceptions: s/p surgery  Communication   Communication: No difficulties  Cognition Arousal/Alertness: Awake/alert Behavior During Therapy: Flat affect Overall Cognitive Status: Within Functional Limits for tasks assessed                                        General Comments      Exercises     Assessment/Plan    PT Assessment Patient needs continued PT services  PT Problem List Decreased strength;Decreased range of motion;Decreased activity tolerance;Decreased balance;Decreased mobility;Decreased knowledge of use of DME;Decreased safety awareness;Decreased knowledge of precautions;Pain       PT Treatment Interventions DME instruction;Gait training;Stair training;Functional mobility training;Therapeutic activities;Therapeutic exercise;Neuromuscular re-education;Patient/family education    PT Goals (Current goals can be found in the Care Plan section)  Acute Rehab PT Goals Patient Stated Goal: Home today PT Goal Formulation: With patient Time For Goal Achievement: 05/13/18 Potential to Achieve Goals: Good    Frequency Min 5X/week   Barriers to discharge        Co-evaluation               AM-PAC PT "6 Clicks" Daily Activity  Outcome Measure Difficulty turning over in bed (including adjusting bedclothes, sheets and blankets)?: A Little Difficulty moving from lying on back to sitting on the side of the bed? : A Little Difficulty sitting down on and standing up from a chair with arms (e.g., wheelchair, bedside commode, etc,.)?: A Little Help needed moving to and from a bed to chair (including a wheelchair)?: A Little Help needed walking in hospital room?: A Little Help needed climbing 3-5  steps with a railing? : A Little 6 Click Score: 18    End of Session Equipment Utilized During Treatment: Gait belt;Back brace Activity Tolerance: Patient tolerated treatment well Patient left: in chair;with call bell/phone within reach Nurse Communication: Mobility status PT Visit Diagnosis: Unsteadiness on feet (R26.81);History of falling (Z91.81);Pain Pain - part of body: (back)    Time: 4696-2952 PT Time Calculation (min) (ACUTE ONLY): 21 min   Charges:   PT Evaluation $PT Eval Moderate Complexity: 1 Mod     PT G Codes:        Rolinda Roan, PT, DPT Acute Rehabilitation Services Pager: [phone]   Thelma Comp 05/06/2018, 8:06 AM

## 2018-05-06 NOTE — Evaluation (Signed)
Occupational Therapy Evaluation Patient Details Name: Brandon Fleming MRN: [id number] DOB: 13-Dec-1946 Today's Date: 05/06/2018    History of Present Illness Pt is a 72 y/o male who presents s/p L3-L5 PLIF with brace. PMH significant for HTN, HLD, tremors and ETOH abuse   Clinical Impression   This 72 yo male admitted and underwent above presents to acute OT with decreased balance and decreased knowledge of DME for tub and toilet transfers. Will see pt one more time today to go over toilet transfers, tub transfers, and basic ADLs.    Follow Up Recommendations  No OT follow up;Supervision/Assistance - 24 hour    Equipment Recommendations  3 in 1 bedside commode       Precautions / Restrictions Precautions Precautions: Fall;Back Precaution Booklet Issued: Yes (comment) Precaution Comments: Reviewed handout in detail. Pt was cued for precautions during functional mobility.  Required Braces or Orthoses: Spinal Brace Spinal Brace: Lumbar corset;Applied in sitting position Restrictions Weight Bearing Restrictions: No      Mobility Bed Mobility       General bed mobility comments: Pt up in recliner upon arrival  Transfers Overall transfer level: Needs assistance Equipment used: None Transfers: Sit to/from Stand Sit to Stand: Min guard            Balance Overall balance assessment: Needs assistance Sitting-balance support: Feet supported;No upper extremity supported Sitting balance-Leahy Scale: Good     Standing balance support: No upper extremity supported Standing balance-Leahy Scale: Poor Standing balance comment: Occasional min assist required                           ADL either performed or assessed with clinical judgement   ADL                                         General ADL Comments: Educated pt on use of wet wipes for back peri care, use of 2 cups for brushing teeth, not sitting more than 20-30 minutes at a time but can  then get up and walk around do not need to lay back down, sequence of dressing, placing items at level he can get to them     Vision Patient Visual Report: No change from baseline              Pertinent Vitals/Pain Pain Assessment: Faces Faces Pain Scale: Hurts a little bit Pain Location: Back Pain Descriptors / Indicators: Operative site guarding Pain Intervention(s): Monitored during session     Hand Dominance Right   Extremity/Trunk Assessment Upper Extremity Assessment Upper Extremity Assessment: Overall WFL for tasks assessed     Communication Communication Communication: No difficulties   Cognition Arousal/Alertness: Awake/alert Behavior During Therapy: Flat affect Overall Cognitive Status: Within Functional Limits for tasks assessed                                                Home Living Family/patient expects to be discharged to:: Private residence Living Arrangements: Spouse/significant other Available Help at Discharge: Family;Available 24 hours/day Type of Home: House Home Access: Stairs to enter [employer] of Steps: 1   Home Layout: Two level;Able to live on main level with bedroom/bathroom Alternate Level Stairs-Number of Steps: flight  Bathroom Shower/Tub: Teacher, early years/pre: Standard Bathroom Accessibility: Yes   Home Equipment: Environmental consultant - 2 wheels;Cane - single point          Prior Functioning/Environment Level of Independence: Independent        Comments: independent with ADLs and IADLs and very active per wife        OT Problem List: Decreased strength;Decreased range of motion;Pain;Impaired balance (sitting and/or standing)      OT Treatment/Interventions: Self-care/ADL training;Patient/family education;DME and/or AE instruction;Balance training    OT Goals(Current goals can be found in the care plan section) Acute Rehab OT Goals Patient Stated Goal: Home today OT Goal  Formulation: With patient Time For Goal Achievement: 05/13/18 Potential to Achieve Goals: Good  OT Frequency: Min 2X/week              AM-PAC PT "6 Clicks" Daily Activity     Outcome Measure Help from another person eating meals?: None Help from another person taking care of personal grooming?: A Little Help from another person toileting, which includes using toliet, bedpan, or urinal?: A Little Help from another person bathing (including washing, rinsing, drying)?: A Little Help from another person to put on and taking off regular upper body clothing?: A Little Help from another person to put on and taking off regular lower body clothing?: A Little 6 Click Score: 19   End of Session Equipment Utilized During Treatment: Gait belt;Back brace Nurse Communication: Mobility status(need for 3n1)  Activity Tolerance: Patient tolerated treatment well Patient left: in chair;with call bell/phone within reach  OT Visit Diagnosis: Unsteadiness on feet (R26.81);Pain Pain - part of body: (back)                Time: 5916-3846 OT Time Calculation (min): 19 min Charges:  OT General Charges $OT Visit: 1 Visit OT Evaluation $OT Eval Moderate Complexity: [address], [place] [phone] 05/06/2018

## 2018-05-06 NOTE — Discharge Instructions (Signed)
.  jen

## 2018-05-07 MED FILL — Sodium Chloride IV Soln 0.9%: INTRAVENOUS | Qty: 1000 | Status: AC

## 2018-05-07 MED FILL — Heparin Sodium (Porcine) Inj 1000 Unit/ML: INTRAMUSCULAR | Qty: 30 | Status: AC

## 2018-05-13 DIAGNOSIS — Z4789 Encounter for other orthopedic aftercare: Secondary | ICD-10-CM | POA: Diagnosis not present

## 2018-05-13 DIAGNOSIS — M5416 Radiculopathy, lumbar region: Secondary | ICD-10-CM | POA: Diagnosis not present

## 2018-05-13 DIAGNOSIS — M4326 Fusion of spine, lumbar region: Secondary | ICD-10-CM | POA: Diagnosis not present

## 2018-05-13 DIAGNOSIS — M4316 Spondylolisthesis, lumbar region: Secondary | ICD-10-CM | POA: Diagnosis not present

## 2018-05-13 DIAGNOSIS — M48062 Spinal stenosis, lumbar region with neurogenic claudication: Secondary | ICD-10-CM | POA: Diagnosis not present

## 2018-05-13 DIAGNOSIS — Z6827 Body mass index (BMI) 27.0-27.9, adult: Secondary | ICD-10-CM | POA: Diagnosis not present

## 2018-05-13 DIAGNOSIS — H547 Unspecified visual loss: Secondary | ICD-10-CM | POA: Diagnosis not present

## 2018-05-13 DIAGNOSIS — I10 Essential (primary) hypertension: Secondary | ICD-10-CM | POA: Diagnosis not present

## 2018-05-13 DIAGNOSIS — F329 Major depressive disorder, single episode, unspecified: Secondary | ICD-10-CM | POA: Diagnosis not present

## 2018-05-17 ENCOUNTER — Encounter (HOSPITAL_COMMUNITY): Payer: Self-pay | Admitting: *Deleted

## 2018-05-17 ENCOUNTER — Other Ambulatory Visit: Payer: Self-pay

## 2018-05-17 DIAGNOSIS — Z4789 Encounter for other orthopedic aftercare: Secondary | ICD-10-CM | POA: Diagnosis not present

## 2018-05-17 DIAGNOSIS — M5416 Radiculopathy, lumbar region: Secondary | ICD-10-CM | POA: Diagnosis not present

## 2018-05-17 DIAGNOSIS — H547 Unspecified visual loss: Secondary | ICD-10-CM | POA: Diagnosis not present

## 2018-05-17 DIAGNOSIS — I10 Essential (primary) hypertension: Secondary | ICD-10-CM | POA: Diagnosis not present

## 2018-05-17 DIAGNOSIS — M48062 Spinal stenosis, lumbar region with neurogenic claudication: Secondary | ICD-10-CM | POA: Diagnosis not present

## 2018-05-17 DIAGNOSIS — M4316 Spondylolisthesis, lumbar region: Secondary | ICD-10-CM | POA: Diagnosis not present

## 2018-05-17 NOTE — Progress Notes (Addendum)
SDW-Pre-op assessment was completed by pt spouse, Rise Paganini (DPR). Spouse denies that pt C/O SOB and chest pain. Spouse denies that pt is under the care of a cardiologist. Spouse denies that pt had a stress test, echo and cardiac cath. Spouse stated that pt was made aware to stop taking Aspirin, fish oil, herbal medications, vitamins and NSAIDs. Spouse wanted to know if pt was going to be admitted. Spouse made aware that MD makes that decision; spouse insisted that she wants the pt to be admitted. Spouse wanted clarification about " Short Stay." Spouse made aware that  " all surgical patients go to the Short Stay Unit to prepare for surgery. Some patients are discharged after their procedure while others, are admitted to the hospital. In an attempt to provide the phone number to the [facility] [place] [facility] , the spouse angrily stated " no one never answers the phone or follows up! Then, they say they get a break when they step away! "  She stated " Lambert Keto is my friend !? " I apologized several times during the call .  Nurse spoke with Bethena Roys, Surgical Coordinator, to make MD aware and to have someone follow up with spouse regarding duration of stay.

## 2018-05-18 ENCOUNTER — Ambulatory Visit (HOSPITAL_COMMUNITY): Payer: Medicare Other | Admitting: Anesthesiology

## 2018-05-18 ENCOUNTER — Ambulatory Visit (HOSPITAL_COMMUNITY)
Admission: RE | Admit: 2018-05-18 | Discharge: 2018-05-19 | Disposition: A | Discharge status: Home / Self Care | Payer: Medicare Other | Source: Ambulatory Visit | Attending: Ophthalmology | Admitting: Ophthalmology

## 2018-05-18 ENCOUNTER — Encounter (HOSPITAL_COMMUNITY): Payer: Self-pay

## 2018-05-18 ENCOUNTER — Encounter (HOSPITAL_COMMUNITY)
Admission: RE | Disposition: A | Discharge status: Home / Self Care | Payer: Self-pay | Source: Ambulatory Visit | Attending: Ophthalmology

## 2018-05-18 ENCOUNTER — Other Ambulatory Visit: Payer: Self-pay

## 2018-05-18 DIAGNOSIS — H2710 Unspecified dislocation of lens: Secondary | ICD-10-CM | POA: Diagnosis not present

## 2018-05-18 DIAGNOSIS — H16002 Unspecified corneal ulcer, left eye: Secondary | ICD-10-CM | POA: Insufficient documentation

## 2018-05-18 DIAGNOSIS — T8522XS Displacement of intraocular lens, sequela: Secondary | ICD-10-CM

## 2018-05-18 DIAGNOSIS — Y929 Unspecified place or not applicable: Secondary | ICD-10-CM | POA: Diagnosis not present

## 2018-05-18 DIAGNOSIS — H27122 Anterior dislocation of lens, left eye: Secondary | ICD-10-CM | POA: Diagnosis not present

## 2018-05-18 DIAGNOSIS — Y772 Prosthetic and other implants, materials and accessory ophthalmic devices associated with adverse incidents: Secondary | ICD-10-CM | POA: Diagnosis not present

## 2018-05-18 DIAGNOSIS — T8522XA Displacement of intraocular lens, initial encounter: Secondary | ICD-10-CM | POA: Insufficient documentation

## 2018-05-18 DIAGNOSIS — I10 Essential (primary) hypertension: Secondary | ICD-10-CM | POA: Insufficient documentation

## 2018-05-18 DIAGNOSIS — E785 Hyperlipidemia, unspecified: Secondary | ICD-10-CM | POA: Diagnosis not present

## 2018-05-18 DIAGNOSIS — Z87891 Personal history of nicotine dependence: Secondary | ICD-10-CM | POA: Diagnosis not present

## 2018-05-18 DIAGNOSIS — K219 Gastro-esophageal reflux disease without esophagitis: Secondary | ICD-10-CM | POA: Diagnosis not present

## 2018-05-18 DIAGNOSIS — R269 Unspecified abnormalities of gait and mobility: Secondary | ICD-10-CM | POA: Diagnosis not present

## 2018-05-18 DIAGNOSIS — Z79899 Other long term (current) drug therapy: Secondary | ICD-10-CM | POA: Diagnosis not present

## 2018-05-18 HISTORY — PX: VITRECTOMY: SHX106

## 2018-05-18 HISTORY — PX: PARS PLANA VITRECTOMY: SHX2166

## 2018-05-18 HISTORY — PX: LASER PHOTO ABLATION: SHX5942

## 2018-05-18 SURGERY — PARS PLANA VITRECTOMY WITH 25G REMOVAL/SUTURE INTRAOCULAR LENS
Anesthesia: General | Site: Eye | Laterality: Left

## 2018-05-18 MED ORDER — FENTANYL CITRATE (PF) 250 MCG/5ML IJ SOLN
INTRAMUSCULAR | Status: AC
  Filled 2018-05-18: qty 5

## 2018-05-18 MED ORDER — BSS PLUS IO SOLN
INTRAOCULAR | Status: AC
  Filled 2018-05-18: qty 500

## 2018-05-18 MED ORDER — DEXAMETHASONE SODIUM PHOSPHATE 10 MG/ML IJ SOLN
INTRAMUSCULAR | Status: DC | PRN
  Administered 2018-05-18: 10 mg

## 2018-05-18 MED ORDER — MIDAZOLAM HCL 2 MG/2ML IJ SOLN
INTRAMUSCULAR | Status: DC | PRN
  Administered 2018-05-18: 1 mg via INTRAVENOUS

## 2018-05-18 MED ORDER — LIDOCAINE 2% (20 MG/ML) 5 ML SYRINGE
INTRAMUSCULAR | Status: AC
  Filled 2018-05-18: qty 10

## 2018-05-18 MED ORDER — HYPROMELLOSE (GONIOSCOPIC) 2.5 % OP SOLN
OPHTHALMIC | Status: AC
  Filled 2018-05-18: qty 15

## 2018-05-18 MED ORDER — SODIUM CHLORIDE 0.9 % IJ SOLN
INTRAMUSCULAR | Status: AC
  Filled 2018-05-18: qty 10

## 2018-05-18 MED ORDER — ARTIFICIAL TEARS OPHTHALMIC OINT
TOPICAL_OINTMENT | OPHTHALMIC | Status: AC
  Filled 2018-05-18: qty 3.5

## 2018-05-18 MED ORDER — PROPOFOL 10 MG/ML IV BOLUS
INTRAVENOUS | Status: AC
  Filled 2018-05-18: qty 20

## 2018-05-18 MED ORDER — ONDANSETRON HCL 4 MG/2ML IJ SOLN
INTRAMUSCULAR | Status: DC | PRN
  Administered 2018-05-18: 4 mg via INTRAVENOUS

## 2018-05-18 MED ORDER — EPHEDRINE SULFATE 50 MG/ML IJ SOLN
INTRAMUSCULAR | Status: DC | PRN
  Administered 2018-05-18 (×2): 5 mg via INTRAVENOUS

## 2018-05-18 MED ORDER — LATANOPROST 0.005 % OP SOLN
1.0000 [drp] | Freq: Every day | OPHTHALMIC | Status: DC
  Filled 2018-05-18: qty 2.5

## 2018-05-18 MED ORDER — ROSUVASTATIN CALCIUM 10 MG PO TABS
10.0000 mg | ORAL_TABLET | Freq: Every day | ORAL | Status: DC
  Administered 2018-05-18: 10 mg via ORAL
  Filled 2018-05-18: qty 1

## 2018-05-18 MED ORDER — LIDOCAINE HCL 2 % IJ SOLN
INTRAMUSCULAR | Status: AC
  Filled 2018-05-18: qty 20

## 2018-05-18 MED ORDER — ACETAZOLAMIDE SODIUM 500 MG IJ SOLR
INTRAMUSCULAR | Status: AC
  Filled 2018-05-18: qty 500

## 2018-05-18 MED ORDER — GATIFLOXACIN 0.5 % OP SOLN
1.0000 [drp] | OPHTHALMIC | Status: AC
  Administered 2018-05-18 (×3): 1 [drp] via OPHTHALMIC
  Filled 2018-05-18: qty 2.5

## 2018-05-18 MED ORDER — PSYLLIUM 0.52 G PO CAPS: 0.5200 g | ORAL_CAPSULE | Freq: Every day | ORAL | Status: DC | PRN

## 2018-05-18 MED ORDER — BUPIVACAINE HCL (PF) 0.75 % IJ SOLN
INTRAMUSCULAR | Status: AC
  Filled 2018-05-18: qty 10

## 2018-05-18 MED ORDER — EPHEDRINE SULFATE 50 MG/ML IJ SOLN
INTRAMUSCULAR | Status: AC
  Filled 2018-05-18: qty 1

## 2018-05-18 MED ORDER — DORZOLAMIDE HCL 2 % OP SOLN
1.0000 [drp] | Freq: Three times a day (TID) | OPHTHALMIC | Status: DC
  Filled 2018-05-18: qty 10

## 2018-05-18 MED ORDER — MIDAZOLAM HCL 2 MG/2ML IJ SOLN
INTRAMUSCULAR | Status: AC
  Filled 2018-05-18: qty 2

## 2018-05-18 MED ORDER — TACROLIMUS 0.1 % EX OINT
1.0000 "application " | TOPICAL_OINTMENT | Freq: Two times a day (BID) | CUTANEOUS | Status: DC
  Administered 2018-05-18: 1 via TOPICAL
  Filled 2018-05-18 (×17): qty 1

## 2018-05-18 MED ORDER — SODIUM CHLORIDE 0.9 % IV SOLN
INTRAVENOUS | Status: DC | PRN
  Administered 2018-05-18 (×2): via INTRAVENOUS

## 2018-05-18 MED ORDER — TIMOLOL MALEATE 0.25 % OP SOLN
1.0000 [drp] | Freq: Two times a day (BID) | OPHTHALMIC | Status: DC
  Filled 2018-05-18: qty 5

## 2018-05-18 MED ORDER — CEFAZOLIN SODIUM-DEXTROSE 2-4 GM/100ML-% IV SOLN
2.0000 g | INTRAVENOUS | Status: AC
  Administered 2018-05-18: 2 g via INTRAVENOUS
  Filled 2018-05-18: qty 100

## 2018-05-18 MED ORDER — HYDROCODONE-ACETAMINOPHEN 5-325 MG PO TABS: 1.0000 | ORAL_TABLET | ORAL | Status: DC | PRN

## 2018-05-18 MED ORDER — POLYMYXIN B SULFATE 500000 UNITS IJ SOLR
INTRAMUSCULAR | Status: AC
  Filled 2018-05-18: qty 500000

## 2018-05-18 MED ORDER — PHENYLEPHRINE HCL 2.5 % OP SOLN
1.0000 [drp] | OPHTHALMIC | Status: AC
  Administered 2018-05-18 (×3): 1 [drp] via OPHTHALMIC
  Filled 2018-05-18: qty 2

## 2018-05-18 MED ORDER — ACETAZOLAMIDE SODIUM 500 MG IJ SOLR
500.0000 mg | Freq: Once | INTRAMUSCULAR | Status: AC
  Administered 2018-05-19: 500 mg via INTRAVENOUS
  Filled 2018-05-18: qty 500

## 2018-05-18 MED ORDER — EPINEPHRINE PF 1 MG/ML IJ SOLN
INTRAOCULAR | Status: DC | PRN
  Administered 2018-05-18: 12:00:00

## 2018-05-18 MED ORDER — PHENYLEPHRINE HCL 10 MG/ML IJ SOLN
INTRAVENOUS | Status: DC | PRN
  Administered 2018-05-18: 10 ug/min via INTRAVENOUS

## 2018-05-18 MED ORDER — FENOFIBRATE 54 MG PO TABS
54.0000 mg | ORAL_TABLET | Freq: Every day | ORAL | Status: DC
  Filled 2018-05-18 (×2): qty 1

## 2018-05-18 MED ORDER — OXYCODONE HCL 5 MG PO TABS
5.0000 mg | ORAL_TABLET | ORAL | Status: DC | PRN
  Administered 2018-05-19: 5 mg via ORAL
  Filled 2018-05-18: qty 1

## 2018-05-18 MED ORDER — FLUOXETINE HCL 20 MG PO CAPS
40.0000 mg | ORAL_CAPSULE | Freq: Every day | ORAL | Status: DC
  Filled 2018-05-18: qty 2

## 2018-05-18 MED ORDER — TETRACAINE HCL 0.5 % OP SOLN
2.0000 [drp] | Freq: Once | OPHTHALMIC | Status: DC
  Filled 2018-05-18: qty 4

## 2018-05-18 MED ORDER — CEFTAZIDIME 1 G IJ SOLR
INTRAMUSCULAR | Status: AC
  Filled 2018-05-18: qty 1

## 2018-05-18 MED ORDER — LACTATED RINGERS IV SOLN: INTRAVENOUS | Status: DC | PRN

## 2018-05-18 MED ORDER — PROPOFOL 10 MG/ML IV BOLUS
INTRAVENOUS | Status: DC | PRN
  Administered 2018-05-18: 150 mg via INTRAVENOUS

## 2018-05-18 MED ORDER — BRIMONIDINE TARTRATE 0.2 % OP SOLN
1.0000 [drp] | Freq: Two times a day (BID) | OPHTHALMIC | Status: DC
  Filled 2018-05-18: qty 5

## 2018-05-18 MED ORDER — BACITRACIN-POLYMYXIN B 500-10000 UNIT/GM OP OINT
1.0000 "application " | TOPICAL_OINTMENT | Freq: Three times a day (TID) | OPHTHALMIC | Status: DC
  Filled 2018-05-18: qty 3.5

## 2018-05-18 MED ORDER — SILDENAFIL CITRATE 100 MG PO TABS: 100.0000 mg | ORAL_TABLET | Freq: Every day | ORAL | Status: DC | PRN

## 2018-05-18 MED ORDER — LINACLOTIDE 72 MCG PO CAPS
72.0000 ug | ORAL_CAPSULE | Freq: Every day | ORAL | Status: DC | PRN
  Administered 2018-05-18: 72 ug via ORAL
  Filled 2018-05-18: qty 1

## 2018-05-18 MED ORDER — TEMAZEPAM 15 MG PO CAPS: 15.0000 mg | ORAL_CAPSULE | Freq: Every evening | ORAL | Status: DC | PRN

## 2018-05-18 MED ORDER — LIDOCAINE 2% (20 MG/ML) 5 ML SYRINGE
INTRAMUSCULAR | Status: DC | PRN
  Administered 2018-05-18: 60 mg via INTRAVENOUS

## 2018-05-18 MED ORDER — ONDANSETRON HCL 4 MG/2ML IJ SOLN
4.0000 mg | Freq: Four times a day (QID) | INTRAMUSCULAR | Status: DC
  Administered 2018-05-18 – 2018-05-19 (×3): 4 mg via INTRAVENOUS
  Filled 2018-05-18 (×3): qty 2

## 2018-05-18 MED ORDER — LISINOPRIL 20 MG PO TABS
20.0000 mg | ORAL_TABLET | Freq: Every day | ORAL | Status: DC
  Administered 2018-05-18: 20 mg via ORAL
  Filled 2018-05-18: qty 1

## 2018-05-18 MED ORDER — GATIFLOXACIN 0.5 % OP SOLN
1.0000 [drp] | Freq: Four times a day (QID) | OPHTHALMIC | Status: DC
  Filled 2018-05-18: qty 2.5

## 2018-05-18 MED ORDER — PREDNISOLONE ACETATE 1 % OP SUSP: 1.0000 [drp] | Freq: Two times a day (BID) | OPHTHALMIC | Status: DC

## 2018-05-18 MED ORDER — BSS IO SOLN
INTRAOCULAR | Status: AC
  Filled 2018-05-18: qty 15

## 2018-05-18 MED ORDER — TIMOLOL HEMIHYDRATE 0.25 % OP SOLN: 1.0000 [drp] | Freq: Two times a day (BID) | OPHTHALMIC | Status: DC

## 2018-05-18 MED ORDER — CYCLOPENTOLATE HCL 1 % OP SOLN
1.0000 [drp] | OPHTHALMIC | Status: AC
  Administered 2018-05-18 (×3): 1 [drp] via OPHTHALMIC
  Filled 2018-05-18: qty 2

## 2018-05-18 MED ORDER — STERILE WATER FOR INJECTION IJ SOLN
INTRAMUSCULAR | Status: AC
  Filled 2018-05-18: qty 20

## 2018-05-18 MED ORDER — AMLODIPINE BESYLATE 5 MG PO TABS
5.0000 mg | ORAL_TABLET | Freq: Every day | ORAL | Status: DC
  Filled 2018-05-18: qty 1

## 2018-05-18 MED ORDER — SODIUM HYALURONATE 10 MG/ML IO SOLN
INTRAOCULAR | Status: DC | PRN
  Administered 2018-05-18 (×2): 0.85 mL via INTRAOCULAR

## 2018-05-18 MED ORDER — STERILE WATER FOR INJECTION IJ SOLN
INTRAMUSCULAR | Status: DC | PRN
  Administered 2018-05-18: 20 mL

## 2018-05-18 MED ORDER — DEXAMETHASONE SODIUM PHOSPHATE 10 MG/ML IJ SOLN
INTRAMUSCULAR | Status: AC
  Filled 2018-05-18: qty 2

## 2018-05-18 MED ORDER — TRIAMCINOLONE ACETONIDE 40 MG/ML IJ SUSP
INTRAMUSCULAR | Status: AC
  Filled 2018-05-18: qty 5

## 2018-05-18 MED ORDER — DOCUSATE SODIUM 100 MG PO CAPS
100.0000 mg | ORAL_CAPSULE | Freq: Two times a day (BID) | ORAL | Status: DC
  Administered 2018-05-18: 100 mg via ORAL
  Filled 2018-05-18: qty 1

## 2018-05-18 MED ORDER — ROCURONIUM BROMIDE 10 MG/ML (PF) SYRINGE
PREFILLED_SYRINGE | INTRAVENOUS | Status: DC | PRN
  Administered 2018-05-18: 40 mg via INTRAVENOUS

## 2018-05-18 MED ORDER — ROCURONIUM BROMIDE 10 MG/ML (PF) SYRINGE
PREFILLED_SYRINGE | INTRAVENOUS | Status: AC
  Filled 2018-05-18: qty 15

## 2018-05-18 MED ORDER — BACITRACIN-POLYMYXIN B 500-10000 UNIT/GM OP OINT
TOPICAL_OINTMENT | OPHTHALMIC | Status: AC
  Filled 2018-05-18: qty 3.5

## 2018-05-18 MED ORDER — PHENYLEPHRINE 40 MCG/ML (10ML) SYRINGE FOR IV PUSH (FOR BLOOD PRESSURE SUPPORT)
PREFILLED_SYRINGE | INTRAVENOUS | Status: AC
  Filled 2018-05-18: qty 10

## 2018-05-18 MED ORDER — PSYLLIUM 95 % PO PACK
1.0000 | PACK | Freq: Every day | ORAL | Status: DC | PRN
Start: 2018-05-18 — End: 2018-05-19
  Filled 2018-05-18: qty 1

## 2018-05-18 MED ORDER — 0.9 % SODIUM CHLORIDE (POUR BTL) OPTIME
TOPICAL | Status: DC | PRN
  Administered 2018-05-18: 1000 mL

## 2018-05-18 MED ORDER — ACETAMINOPHEN 325 MG PO TABS: 325.0000 mg | ORAL_TABLET | ORAL | Status: DC | PRN

## 2018-05-18 MED ORDER — DEXAMETHASONE SODIUM PHOSPHATE 10 MG/ML IJ SOLN
INTRAMUSCULAR | Status: AC
  Filled 2018-05-18: qty 1

## 2018-05-18 MED ORDER — EPINEPHRINE PF 1 MG/ML IJ SOLN
INTRAMUSCULAR | Status: AC
  Filled 2018-05-18: qty 1

## 2018-05-18 MED ORDER — FENTANYL CITRATE (PF) 250 MCG/5ML IJ SOLN
INTRAMUSCULAR | Status: DC | PRN
  Administered 2018-05-18 (×2): 50 ug via INTRAVENOUS
  Administered 2018-05-18: 25 ug via INTRAVENOUS
  Administered 2018-05-18: 50 ug via INTRAVENOUS
  Administered 2018-05-18: 25 ug via INTRAVENOUS

## 2018-05-18 MED ORDER — DONEPEZIL HCL 5 MG PO TABS
5.0000 mg | ORAL_TABLET | Freq: Every day | ORAL | Status: DC
  Administered 2018-05-18: 5 mg via ORAL
  Filled 2018-05-18: qty 1

## 2018-05-18 MED ORDER — ADULT MULTIVITAMIN W/MINERALS CH
1.0000 | ORAL_TABLET | Freq: Every day | ORAL | Status: DC
  Filled 2018-05-18: qty 1

## 2018-05-18 MED ORDER — BUPIVACAINE HCL (PF) 0.75 % IJ SOLN
INTRAMUSCULAR | Status: DC | PRN
  Administered 2018-05-18: 10 mL

## 2018-05-18 MED ORDER — PREDNISOLONE ACETATE 1 % OP SUSP
1.0000 [drp] | Freq: Four times a day (QID) | OPHTHALMIC | Status: DC
  Filled 2018-05-18: qty 5

## 2018-05-18 MED ORDER — EPINEPHRINE PF 1 MG/ML IJ SOLN
INTRAMUSCULAR | Status: DC | PRN
  Administered 2018-05-18: 1 mg

## 2018-05-18 MED ORDER — CYCLOBENZAPRINE HCL 10 MG PO TABS: 10.0000 mg | ORAL_TABLET | Freq: Three times a day (TID) | ORAL | Status: DC | PRN

## 2018-05-18 MED ORDER — BUPIVACAINE-EPINEPHRINE (PF) 0.25% -1:200000 IJ SOLN
INTRAMUSCULAR | Status: AC
  Filled 2018-05-18: qty 30

## 2018-05-18 MED ORDER — SODIUM CHLORIDE 0.45 % IV SOLN
INTRAVENOUS | Status: DC
  Administered 2018-05-18 – 2018-05-19 (×2): via INTRAVENOUS

## 2018-05-18 MED ORDER — VITAMIN B-1 100 MG PO TABS: 100.0000 mg | ORAL_TABLET | Freq: Every day | ORAL | Status: DC

## 2018-05-18 MED ORDER — SODIUM HYALURONATE 10 MG/ML IO SOLN
INTRAOCULAR | Status: AC
  Filled 2018-05-18: qty 0.85

## 2018-05-18 MED ORDER — FOLIC ACID 1 MG PO TABS
1.0000 mg | ORAL_TABLET | Freq: Every day | ORAL | Status: DC
  Administered 2018-05-18: 1 mg via ORAL
  Filled 2018-05-18: qty 1

## 2018-05-18 MED ORDER — SUGAMMADEX SODIUM 200 MG/2ML IV SOLN
INTRAVENOUS | Status: DC | PRN
  Administered 2018-05-18: 200 mg via INTRAVENOUS

## 2018-05-18 MED ORDER — MAGNESIUM HYDROXIDE 400 MG/5ML PO SUSP: 15.0000 mL | Freq: Four times a day (QID) | ORAL | Status: DC | PRN

## 2018-05-18 MED ORDER — PANTOPRAZOLE SODIUM 40 MG PO TBEC: 40.0000 mg | DELAYED_RELEASE_TABLET | Freq: Every day | ORAL | Status: DC

## 2018-05-18 MED ORDER — TROPICAMIDE 1 % OP SOLN
1.0000 [drp] | OPHTHALMIC | Status: AC
  Administered 2018-05-18 (×3): 1 [drp] via OPHTHALMIC
  Filled 2018-05-18: qty 15

## 2018-05-18 MED ORDER — MORPHINE SULFATE (PF) 2 MG/ML IV SOLN: 1.0000 mg | INTRAVENOUS | Status: DC | PRN

## 2018-05-18 MED ORDER — ASPIRIN EC 81 MG PO TBEC: 81.0000 mg | DELAYED_RELEASE_TABLET | Freq: Every day | ORAL | Status: DC

## 2018-05-18 MED ORDER — HYALURONIDASE HUMAN 150 UNIT/ML IJ SOLN
INTRAMUSCULAR | Status: AC
  Filled 2018-05-18: qty 1

## 2018-05-18 MED ORDER — STERILE WATER FOR IRRIGATION IR SOLN
Status: DC | PRN
  Administered 2018-05-18: 1000 mL

## 2018-05-18 MED ORDER — ONDANSETRON HCL 4 MG/2ML IJ SOLN
INTRAMUSCULAR | Status: AC
  Filled 2018-05-18: qty 4

## 2018-05-18 MED ORDER — ATROPINE SULFATE 1 % OP SOLN
OPHTHALMIC | Status: AC
  Filled 2018-05-18: qty 5

## 2018-05-18 MED ORDER — DEXAMETHASONE SODIUM PHOSPHATE 10 MG/ML IJ SOLN
INTRAMUSCULAR | Status: DC | PRN
  Administered 2018-05-18: 10 mg via INTRAVENOUS

## 2018-05-18 MED ORDER — BSS IO SOLN
INTRAOCULAR | Status: DC | PRN
  Administered 2018-05-18: 15 mL
  Administered 2018-05-18 (×2): 15 mL via INTRAOCULAR

## 2018-05-18 SURGICAL SUPPLY — 69 items
APL SRG 3 HI ABS STRL LF PLS (MISCELLANEOUS)
APPLICATOR DR MATTHEWS STRL (MISCELLANEOUS) IMPLANT
BALL CTTN LRG ABS STRL LF (GAUZE/BANDAGES/DRESSINGS) ×3
BLADE EYE CATARACT 19 1.4 BEAV (BLADE) IMPLANT
BLADE KERATOME 2.75 (BLADE) ×2 IMPLANT
BLADE KERATOME 2.75MM (BLADE) ×1
CANNULA VLV SOFT TIP 25G (OPHTHALMIC) ×1 IMPLANT
CANNULA VLV SOFT TIP 25GA (OPHTHALMIC) ×3 IMPLANT
CORDS BIPOLAR (ELECTRODE) ×3 IMPLANT
COTTONBALL LRG STERILE PKG (GAUZE/BANDAGES/DRESSINGS) ×9 IMPLANT
COVER MAYO STAND STRL (DRAPES) ×3 IMPLANT
DRAPE INCISE 51X51 W/FILM STRL (DRAPES) IMPLANT
DRAPE OPHTHALMIC 77X100 STRL (CUSTOM PROCEDURE TRAY) ×3 IMPLANT
FILTER BLUE MILLIPORE (MISCELLANEOUS) IMPLANT
FORCEPS ECKARDT ILM 25G SERR (OPHTHALMIC RELATED) IMPLANT
FORCEPS GRIESHABER ILM 25G A (INSTRUMENTS) ×3 IMPLANT
FORCEPS HORIZONTAL 25G DISP (OPHTHALMIC RELATED) IMPLANT
GAS OPHTHALMIC (MISCELLANEOUS) IMPLANT
GLOVE SS BIOGEL STRL SZ 6.5 (GLOVE) ×2 IMPLANT
GLOVE SS BIOGEL STRL SZ 7 (GLOVE) ×1 IMPLANT
GLOVE SUPERSENSE BIOGEL SZ 6.5 (GLOVE) ×4
GLOVE SUPERSENSE BIOGEL SZ 7 (GLOVE) ×2
GLOVE SURG 8.5 LATEX PF (GLOVE) ×3 IMPLANT
GOWN STRL REUS W/ TWL LRG LVL3 (GOWN DISPOSABLE) ×3 IMPLANT
GOWN STRL REUS W/TWL LRG LVL3 (GOWN DISPOSABLE) ×9
HANDLE PNEUMATIC FOR CONSTEL (OPHTHALMIC) ×3 IMPLANT
KIT BASIN OR (CUSTOM PROCEDURE TRAY) ×3 IMPLANT
KIT TURNOVER KIT B (KITS) ×3 IMPLANT
KNIFE CRESCENT 1.75 EDGEAHEAD (BLADE) IMPLANT
LENS IOL POST 1PIECE DIOP 10.0 ×2 IMPLANT
MICROPICK 25G (MISCELLANEOUS)
NDL 18GX1X1/2 (RX/OR ONLY) (NEEDLE) ×1 IMPLANT
NDL 25GX 5/8IN NON SAFETY (NEEDLE) ×1 IMPLANT
NDL 27GX1/2 REG BEVEL ECLIP (NEEDLE) IMPLANT
NDL FILTER BLUNT 18X1 1/2 (NEEDLE) ×1 IMPLANT
NDL HYPO 30X.5 LL (NEEDLE) ×1 IMPLANT
NEEDLE 18GX1X1/2 (RX/OR ONLY) (NEEDLE) ×3 IMPLANT
NEEDLE 25GX 5/8IN NON SAFETY (NEEDLE) ×3 IMPLANT
NEEDLE 27GX1/2 REG BEVEL ECLIP (NEEDLE) IMPLANT
NEEDLE FILTER BLUNT 18X 1/2SAF (NEEDLE) ×2
NEEDLE FILTER BLUNT 18X1 1/2 (NEEDLE) ×1 IMPLANT
NEEDLE HYPO 30X.5 LL (NEEDLE) ×3 IMPLANT
NS IRRIG 1000ML POUR BTL (IV SOLUTION) ×3 IMPLANT
PACK VITRECTOMY CUSTOM (CUSTOM PROCEDURE TRAY) ×3 IMPLANT
PAD ARMBOARD 7.5X6 YLW CONV (MISCELLANEOUS) ×6 IMPLANT
PAK PIK VITRECTOMY CVS 25GA (OPHTHALMIC) ×3 IMPLANT
PENCIL BIPOLAR 25GA STR DISP (OPHTHALMIC RELATED) IMPLANT
PIC ILLUMINATED 25G (OPHTHALMIC) ×3
PICK MICROPICK 25G (MISCELLANEOUS) IMPLANT
PIK ILLUMINATED 25G (OPHTHALMIC) ×1 IMPLANT
PROBE LASER ILLUM FLEX CVD 25G (OPHTHALMIC) ×2 IMPLANT
ROLLS DENTAL (MISCELLANEOUS) ×6 IMPLANT
SCRAPER DIAMOND 25GA (OPHTHALMIC RELATED) IMPLANT
SPONGE SURGIFOAM ABS GEL 12-7 (HEMOSTASIS) ×3 IMPLANT
STOPCOCK 4 WAY LG BORE MALE ST (IV SETS) IMPLANT
SUT CHROMIC 7 0 TG140 8 (SUTURE) ×3 IMPLANT
SUT ETHILON 10 0 CS140 6 (SUTURE) ×3 IMPLANT
SUT ETHILON 9 0 TG140 8 (SUTURE) IMPLANT
SUT POLY NON ABSORB 10-0 8 STR (SUTURE) ×6 IMPLANT
SUT SILK 4 0 RB 1 (SUTURE) ×2 IMPLANT
SYR 10ML LL (SYRINGE) IMPLANT
SYR 20CC LL (SYRINGE) ×3 IMPLANT
SYR 5ML LL (SYRINGE) IMPLANT
SYR BULB 3OZ (MISCELLANEOUS) ×3 IMPLANT
SYR TB 1ML LUER SLIP (SYRINGE) ×3 IMPLANT
TAPE SURG TRANSPORE 1 IN (GAUZE/BANDAGES/DRESSINGS) ×1 IMPLANT
TAPE SURGICAL TRANSPORE 1 IN (GAUZE/BANDAGES/DRESSINGS) ×2
TUBING HIGH PRESS EXTEN 6IN (TUBING) IMPLANT
WATER STERILE IRR 1000ML POUR (IV SOLUTION) ×3 IMPLANT

## 2018-05-18 NOTE — Transfer of Care (Signed)
Immediate Anesthesia Transfer of Care Note  Patient: Brandon Fleming  Procedure(s) Performed: PARS PLANA VITRECTOMY WITH 25G REMOVAL/SUTURE INTRAOCULAR LENS (Left Eye) LASER PHOTO ABLATION (Left Eye)  Patient Location: PACU  Anesthesia Type:General  Level of Consciousness: awake, alert  and oriented  Airway & Oxygen Therapy: Patient Spontanous Breathing and Patient connected to nasal cannula oxygen  Post-op Assessment: Report given to RN and Post -op Vital signs reviewed and stable  Post vital signs: Reviewed and stable  Last Vitals:  Vitals Value Taken Time  BP 132/72 05/18/2018  2:10 PM  Temp 36.7 C 05/18/2018  2:11 PM  Pulse 81 05/18/2018  2:12 PM  Resp 12 05/18/2018  2:12 PM  SpO2 99 % 05/18/2018  2:12 PM  Vitals shown include unvalidated device data.  Last Pain:  Vitals:   05/18/18 0950  TempSrc:   PainSc: 3       Patients Stated Pain Goal: 3 (65/68/12 7517)  Complications: No apparent anesthesia complications

## 2018-05-18 NOTE — Brief Op Note (Signed)
05/18/2018  2:10 PM  PATIENT:  [name]  72 y.o. male  PRE-OPERATIVE DIAGNOSIS:  DISLOCATED INTRAOCULAR LENS  POST-OPERATIVE DIAGNOSIS:  DISLOCATED INTRAOCULAR LENS  PROCEDURE:  Procedure(s): PARS PLANA VITRECTOMY WITH 25G REMOVAL/SUTURE INTRAOCULAR LENS (Left) LASER PHOTO ABLATION (Left)  SURGEON:  Surgeon(s) and Role:    * Hayden Pedro, MD - Primary  Brief Operative note   Preoperative diagnosis:  DISLOCATED INTRAOCULAR LENS Postoperative diagnosis  * No Diagnosis Codes entered *  Procedures: Pars plana vitrectomy, removal of dislocated IOL, laser, gas injection. Placement of secondary IOL with suture left eye  Surgeon:  Hayden Pedro, MD...  Assistant:  Deatra Ina SA    Anesthesia: General  Specimen: none  Estimated blood loss:  1cc  Complications: none  Patient sent to PACU in good condition  Composed by Hayden Pedro MD  Dictation number: [id number]

## 2018-05-18 NOTE — Anesthesia Procedure Notes (Signed)
Procedure Name: Intubation Date/Time: 05/18/2018 11:39 AM Performed by: Clearnce Sorrel, CRNA Pre-anesthesia Checklist: Patient identified, Emergency Drugs available, Suction available, Patient being monitored and Timeout performed Patient Re-evaluated:Patient Re-evaluated prior to induction Oxygen Delivery Method: Circle system utilized Preoxygenation: Pre-oxygenation with 100% oxygen Induction Type: IV induction Ventilation: Mask ventilation without difficulty Laryngoscope Size: Mac and 3 Grade View: Grade I Tube type: Oral Tube size: 7.5 mm Number of attempts: 1 Airway Equipment and Method: Stylet Placement Confirmation: ETT inserted through vocal cords under direct vision,  positive ETCO2 and breath sounds checked- equal and bilateral Secured at: 23 cm Tube secured with: Tape Dental Injury: Teeth and Oropharynx as per pre-operative assessment

## 2018-05-18 NOTE — Anesthesia Postprocedure Evaluation (Signed)
Anesthesia Post Note  Patient: Brandon Fleming  Procedure(s) Performed: PARS PLANA VITRECTOMY WITH 25G REMOVAL/SUTURE INTRAOCULAR LENS (Left Eye) LASER PHOTO ABLATION (Left Eye)     Patient location during evaluation: PACU Anesthesia Type: General Level of consciousness: awake and alert Pain management: pain level controlled Vital Signs Assessment: post-procedure vital signs reviewed and stable Respiratory status: spontaneous breathing, nonlabored ventilation, respiratory function stable and patient connected to nasal cannula oxygen Cardiovascular status: blood pressure returned to baseline and stable Postop Assessment: no apparent nausea or vomiting Anesthetic complications: no    Last Vitals:  Vitals:   05/18/18 1440 05/18/18 1520  BP: 132/76 135/80  Pulse: 80 74  Resp: 12 14  Temp:  36.7 C  SpO2: 100% 100%    Last Pain:  Vitals:   05/18/18 1646  TempSrc:   PainSc: 0-No pain                 Catalina Gravel

## 2018-05-18 NOTE — H&P (Signed)
I examined the patient today and there is no change in the medical status 

## 2018-05-18 NOTE — Op Note (Signed)
NAME: Brandon Fleming, Brandon Fleming MEDICAL RECORD HC:[id number] ACCOUNT [id number] DATE OF BIRTH:Dec 02, 1946 FACILITY: [facility] LOCATION: [place], [place]  OPERATIVE REPORT  DATE OF PROCEDURE:  05/18/2018  PREOPERATIVE DIAGNOSES:   1.  Dislocated intraocular lens, left eye.   2.  Foot plate erosion, left eye.  POSTOPERATIVE DIAGNOSES:   1.  Dislocated intraocular lens, left eye.   2.  Foot plate erosion, left eye.  PROCEDURES:  Pars plana vitrectomy, removal.  SURGEON:  Tempie Hoist, MD  ASSISTANT:  Deatra Ina, SA.  ANESTHESIA:  General.  DESCRIPTION OF PROCEDURE:  Usual prep and drape.  The indirect ophthalmoscope laser was moved into place;  1183 burns were placed around the retinal periphery with a power of 1200 milliwatts, 1000 microns each and 0.1 seconds each.  Attention was then  carried to the pars plana area conjunctival peritomy performed from 8 o'clock to 4 o'clock.  A 25-gauge trocar was placed at 10, 2, and 4 o'clock.  infusion at 4 o'clock.  Half thickness scleral flaps were raised at 2 o'clock and 8 o'clock in  anticipation of IOL suture.  A 3 layered corneal scleral wound was created with a diamond knife from 10 o'clock to 2 o'clock, 7 mm in length.  Pars plana vitrectomy was begun just behind the pseudophakos.  Some capsular remnants were removed.  The  central vitreous was removed with a core by core vitrectomy and then the vitrectomy was carried into the mid periphery and in the far periphery with the BIOM viewing system.  Attention was carried to the corneal scleral wound where it was opened with  keratome.  The iris was moved peripherally and the intraocular lens was dialed into the anterior chamber along with large capsular remnants and out through the corneal scleral wound.  No bleeding occurred.  The capsular remnants were then also removed  with the vitreous cutter through the pars plana and through the corneal scleral wound.  A new intraocular lens  made by [employer]  Model CZ70BD, power 10 OD, length 12.5 mm optic 7.0 mm, serial #[id number], expiration date 10/14/2020 was  brought into the field.  It was inspected and cleaned.  Two Prolene sutures were passed beneath the scleral flaps behind the iris and out through the opposite side sclera.  A docking needle system was used.  These sutures were externalized through the  pupil and out through the corneal scleral wound.  The sutures were attached to the islets of the lens securely.  The lens was then passed into the anterior chamber and into the posterior chamber and dialed into place.  The Prolene sutures were drawn  securely and they were knotted and the free ends were removed.  The intraocular lens was in proper position and centralized.  The corneal scleral wound was closed with 6 interrupted 10-0 nylon sutures.  The wound was tested and found to be secure.   Additional vitrectomy was carried out at this point, removing the large pigment clumps from the vitreous from the retinal surface.  Once this was accomplished, the endolaser was positioned in the eye; 588 burns were placed around the retinal periphery.   The power of 300 milliwatts, 1000 microns and 0.1 seconds each.  The 25 gauge trocars were removed; 30% gas fluid exchange was carried out in the vitreous cavity.  The 25 gauge trocars were removed.  The wounds were tested and found to be secure.  The  corneal wound was tested and found to be  secure.  The conjunctiva was reposited with 7-0 chromic suture.  Polymyxin and ceftazidime were rinsed around the globe for  antibiotic coverage.  Decadron 10 mg was injected into the lower subconjunctival space.   Marcaine was injected around the globe for postoperative pain.  Closing pressure was 10 with a Barraquer tonometer.  Polysporin ophthalmic ointment, a patch and a shield were placed.  COMPLICATIONS:  None.    DURATION:    2 hours 30 minutes.  AN/NUANCE  D:05/18/2018  T:05/18/2018 JOB:[id number]

## 2018-05-18 NOTE — Anesthesia Preprocedure Evaluation (Addendum)
Anesthesia Evaluation  Patient identified by MRN, date of birth, ID band Patient awake  General Assessment Comment:Dislocated intraocular lens  Reviewed: Allergy & Precautions, NPO status , Patient's Chart, lab work & pertinent test results  Airway Mallampati: II  TM Distance: >3 FB Neck ROM: Full    Dental  (+) Teeth Intact, Dental Advisory Given   Pulmonary former smoker,    Pulmonary exam normal breath sounds clear to auscultation       Cardiovascular hypertension, Pt. on medications Normal cardiovascular exam Rhythm:Regular Rate:Normal     Neuro/Psych negative neurological ROS     GI/Hepatic GERD  Medicated,(+)     substance abuse  alcohol use,   Endo/Other  negative endocrine ROS  Renal/GU negative Renal ROS     Musculoskeletal negative musculoskeletal ROS (+)   Abdominal   Peds  Hematology  (+) Blood dyscrasia, anemia ,   Anesthesia Other Findings Day of surgery medications reviewed with the patient.  Reproductive/Obstetrics                             Anesthesia Physical Anesthesia Plan  ASA: II  Anesthesia Plan: General   Post-op Pain Management:    Induction: Intravenous  PONV Risk Score and Plan: 2 and Dexamethasone and Ondansetron  Airway Management Planned: Oral ETT  Additional Equipment:   Intra-op Plan:   Post-operative Plan: Extubation in OR  Informed Consent: I have reviewed the patients History and Physical, chart, labs and discussed the procedure including the risks, benefits and alternatives for the proposed anesthesia with the patient or authorized representative who has indicated his/her understanding and acceptance.   Dental advisory given  Plan Discussed with: CRNA  Anesthesia Plan Comments:         Anesthesia Quick Evaluation

## 2018-05-19 ENCOUNTER — Encounter (HOSPITAL_COMMUNITY): Payer: Self-pay | Admitting: Ophthalmology

## 2018-05-19 DIAGNOSIS — Z87891 Personal history of nicotine dependence: Secondary | ICD-10-CM | POA: Diagnosis not present

## 2018-05-19 DIAGNOSIS — Z79899 Other long term (current) drug therapy: Secondary | ICD-10-CM | POA: Diagnosis not present

## 2018-05-19 DIAGNOSIS — T8522XA Displacement of intraocular lens, initial encounter: Secondary | ICD-10-CM | POA: Diagnosis not present

## 2018-05-19 DIAGNOSIS — K219 Gastro-esophageal reflux disease without esophagitis: Secondary | ICD-10-CM | POA: Diagnosis not present

## 2018-05-19 DIAGNOSIS — H16002 Unspecified corneal ulcer, left eye: Secondary | ICD-10-CM | POA: Diagnosis not present

## 2018-05-19 DIAGNOSIS — I10 Essential (primary) hypertension: Secondary | ICD-10-CM | POA: Diagnosis not present

## 2018-05-19 MED ORDER — GATIFLOXACIN 0.5 % OP SOLN: 1.0000 [drp] | Freq: Four times a day (QID) | OPHTHALMIC | Status: DC

## 2018-05-19 MED ORDER — TIMOLOL MALEATE 0.25 % OP SOLN: 1.0000 [drp] | Freq: Two times a day (BID) | OPHTHALMIC | 12 refills | Status: DC

## 2018-05-19 MED ORDER — BACITRACIN-POLYMYXIN B 500-10000 UNIT/GM OP OINT: 1.0000 "application " | TOPICAL_OINTMENT | Freq: Three times a day (TID) | OPHTHALMIC | 0 refills | Status: DC

## 2018-05-19 MED ORDER — PREDNISOLONE ACETATE 1 % OP SUSP: 1.0000 [drp] | Freq: Four times a day (QID) | OPHTHALMIC | 0 refills | Status: DC

## 2018-05-19 NOTE — Discharge Summary (Signed)
Discharge summary not needed on [facility] patients per medical records. 

## 2018-05-19 NOTE — Progress Notes (Signed)
05/19/2018, 6:17 AM  Mental Status:  Awake, Alert, Oriented  Anterior segment: Cornea  Clear    Anterior Chamber Clear    Lens:    IOL  Intra Ocular Pressure 20 mmHg with Tonopen  Vitreous: Clear 10%gas bubble   Retina:  Attached Good laser reaction   Impression: Excellent result Retina attached   Final Diagnosis: Principal Problem:   Dislocated intraocular lens, sequela Active Problems:   Dislocated intraocular lens   Plan: start post operative eye drops.  Discharge to home.  Give post operative instructions  Hayden Pedro 05/19/2018, 6:17 AM

## 2018-05-21 DIAGNOSIS — H547 Unspecified visual loss: Secondary | ICD-10-CM | POA: Diagnosis not present

## 2018-05-21 DIAGNOSIS — I10 Essential (primary) hypertension: Secondary | ICD-10-CM | POA: Diagnosis not present

## 2018-05-21 DIAGNOSIS — M5416 Radiculopathy, lumbar region: Secondary | ICD-10-CM | POA: Diagnosis not present

## 2018-05-21 DIAGNOSIS — Z4789 Encounter for other orthopedic aftercare: Secondary | ICD-10-CM | POA: Diagnosis not present

## 2018-05-21 DIAGNOSIS — M4316 Spondylolisthesis, lumbar region: Secondary | ICD-10-CM | POA: Diagnosis not present

## 2018-05-21 DIAGNOSIS — M48062 Spinal stenosis, lumbar region with neurogenic claudication: Secondary | ICD-10-CM | POA: Diagnosis not present

## 2018-05-24 ENCOUNTER — Ambulatory Visit: Payer: Medicare Other | Admitting: Neurology

## 2018-05-24 DIAGNOSIS — M48062 Spinal stenosis, lumbar region with neurogenic claudication: Secondary | ICD-10-CM | POA: Diagnosis not present

## 2018-05-24 DIAGNOSIS — M5416 Radiculopathy, lumbar region: Secondary | ICD-10-CM | POA: Diagnosis not present

## 2018-05-24 DIAGNOSIS — I10 Essential (primary) hypertension: Secondary | ICD-10-CM | POA: Diagnosis not present

## 2018-05-24 DIAGNOSIS — Z4789 Encounter for other orthopedic aftercare: Secondary | ICD-10-CM | POA: Diagnosis not present

## 2018-05-24 DIAGNOSIS — M4316 Spondylolisthesis, lumbar region: Secondary | ICD-10-CM | POA: Diagnosis not present

## 2018-05-24 DIAGNOSIS — H547 Unspecified visual loss: Secondary | ICD-10-CM | POA: Diagnosis not present

## 2018-05-25 DIAGNOSIS — M4316 Spondylolisthesis, lumbar region: Secondary | ICD-10-CM | POA: Diagnosis not present

## 2018-05-25 DIAGNOSIS — I10 Essential (primary) hypertension: Secondary | ICD-10-CM | POA: Diagnosis not present

## 2018-05-25 DIAGNOSIS — Z6825 Body mass index (BMI) 25.0-25.9, adult: Secondary | ICD-10-CM | POA: Diagnosis not present

## 2018-05-26 ENCOUNTER — Encounter (INDEPENDENT_AMBULATORY_CARE_PROVIDER_SITE_OTHER): Payer: Medicare Other | Admitting: Ophthalmology

## 2018-05-26 DIAGNOSIS — I10 Essential (primary) hypertension: Secondary | ICD-10-CM | POA: Diagnosis not present

## 2018-05-26 DIAGNOSIS — Z4789 Encounter for other orthopedic aftercare: Secondary | ICD-10-CM | POA: Diagnosis not present

## 2018-05-26 DIAGNOSIS — H2702 Aphakia, left eye: Secondary | ICD-10-CM

## 2018-05-26 DIAGNOSIS — M5416 Radiculopathy, lumbar region: Secondary | ICD-10-CM | POA: Diagnosis not present

## 2018-05-26 DIAGNOSIS — M48062 Spinal stenosis, lumbar region with neurogenic claudication: Secondary | ICD-10-CM | POA: Diagnosis not present

## 2018-05-26 DIAGNOSIS — H547 Unspecified visual loss: Secondary | ICD-10-CM | POA: Diagnosis not present

## 2018-05-26 DIAGNOSIS — M4316 Spondylolisthesis, lumbar region: Secondary | ICD-10-CM | POA: Diagnosis not present

## 2018-05-31 DIAGNOSIS — I10 Essential (primary) hypertension: Secondary | ICD-10-CM | POA: Diagnosis not present

## 2018-05-31 DIAGNOSIS — M5416 Radiculopathy, lumbar region: Secondary | ICD-10-CM | POA: Diagnosis not present

## 2018-05-31 DIAGNOSIS — H547 Unspecified visual loss: Secondary | ICD-10-CM | POA: Diagnosis not present

## 2018-05-31 DIAGNOSIS — M48062 Spinal stenosis, lumbar region with neurogenic claudication: Secondary | ICD-10-CM | POA: Diagnosis not present

## 2018-05-31 DIAGNOSIS — Z4789 Encounter for other orthopedic aftercare: Secondary | ICD-10-CM | POA: Diagnosis not present

## 2018-05-31 DIAGNOSIS — M4316 Spondylolisthesis, lumbar region: Secondary | ICD-10-CM | POA: Diagnosis not present

## 2018-06-02 DIAGNOSIS — M4316 Spondylolisthesis, lumbar region: Secondary | ICD-10-CM | POA: Diagnosis not present

## 2018-06-02 DIAGNOSIS — M5416 Radiculopathy, lumbar region: Secondary | ICD-10-CM | POA: Diagnosis not present

## 2018-06-02 DIAGNOSIS — M48062 Spinal stenosis, lumbar region with neurogenic claudication: Secondary | ICD-10-CM | POA: Diagnosis not present

## 2018-06-02 DIAGNOSIS — H547 Unspecified visual loss: Secondary | ICD-10-CM | POA: Diagnosis not present

## 2018-06-02 DIAGNOSIS — Z4789 Encounter for other orthopedic aftercare: Secondary | ICD-10-CM | POA: Diagnosis not present

## 2018-06-02 DIAGNOSIS — I10 Essential (primary) hypertension: Secondary | ICD-10-CM | POA: Diagnosis not present

## 2018-06-08 ENCOUNTER — Other Ambulatory Visit: Payer: Self-pay | Admitting: Neurology

## 2018-06-08 ENCOUNTER — Ambulatory Visit: Payer: Medicare Other | Admitting: Neurology

## 2018-06-08 MED ORDER — DONEPEZIL HCL 10 MG PO TABS: 10.0000 mg | ORAL_TABLET | Freq: Every day | ORAL | 3 refills | Status: DC

## 2018-06-08 NOTE — Telephone Encounter (Signed)
I called and spoke with wife. Pt doing well on Aricept 5mg  tab qhs. Having no SE. Agreeable to increase to 10mg  tablet. Would like rx to go to [facility]. Advised I will send to Dr. Jannifer Franklin to send in new rx for this. She verbalized understanding.

## 2018-06-08 NOTE — Progress Notes (Signed)
The prescription for donepezil was sent in.

## 2018-06-09 ENCOUNTER — Encounter (INDEPENDENT_AMBULATORY_CARE_PROVIDER_SITE_OTHER): Payer: Medicare Other | Admitting: Ophthalmology

## 2018-06-09 DIAGNOSIS — M48062 Spinal stenosis, lumbar region with neurogenic claudication: Secondary | ICD-10-CM | POA: Diagnosis not present

## 2018-06-09 DIAGNOSIS — H547 Unspecified visual loss: Secondary | ICD-10-CM | POA: Diagnosis not present

## 2018-06-09 DIAGNOSIS — I10 Essential (primary) hypertension: Secondary | ICD-10-CM | POA: Diagnosis not present

## 2018-06-09 DIAGNOSIS — M4316 Spondylolisthesis, lumbar region: Secondary | ICD-10-CM | POA: Diagnosis not present

## 2018-06-09 DIAGNOSIS — M5416 Radiculopathy, lumbar region: Secondary | ICD-10-CM | POA: Diagnosis not present

## 2018-06-09 DIAGNOSIS — H2702 Aphakia, left eye: Secondary | ICD-10-CM

## 2018-06-09 DIAGNOSIS — Z4789 Encounter for other orthopedic aftercare: Secondary | ICD-10-CM | POA: Diagnosis not present

## 2018-06-10 DIAGNOSIS — D649 Anemia, unspecified: Secondary | ICD-10-CM | POA: Diagnosis not present

## 2018-06-28 ENCOUNTER — Ambulatory Visit (INDEPENDENT_AMBULATORY_CARE_PROVIDER_SITE_OTHER): Payer: Medicare Other | Admitting: Orthopaedic Surgery

## 2018-07-02 ENCOUNTER — Encounter (INDEPENDENT_AMBULATORY_CARE_PROVIDER_SITE_OTHER): Payer: Self-pay | Admitting: Orthopaedic Surgery

## 2018-07-02 ENCOUNTER — Ambulatory Visit (INDEPENDENT_AMBULATORY_CARE_PROVIDER_SITE_OTHER): Payer: Medicare Other | Admitting: Orthopaedic Surgery

## 2018-07-02 ENCOUNTER — Other Ambulatory Visit (INDEPENDENT_AMBULATORY_CARE_PROVIDER_SITE_OTHER): Payer: Self-pay | Admitting: Radiology

## 2018-07-02 VITALS — BP 128/78 | HR 72 | Ht 66.0 in | Wt 160.0 lb

## 2018-07-02 DIAGNOSIS — G8929 Other chronic pain: Secondary | ICD-10-CM | POA: Diagnosis not present

## 2018-07-02 DIAGNOSIS — M25511 Pain in right shoulder: Principal | ICD-10-CM

## 2018-07-02 MED ORDER — METHYLPREDNISOLONE ACETATE 40 MG/ML IJ SUSP
80.0000 mg | INTRAMUSCULAR | Status: AC | PRN
  Administered 2018-07-02: 80 mg

## 2018-07-02 MED ORDER — LIDOCAINE HCL 2 % IJ SOLN
2.0000 mL | INTRAMUSCULAR | Status: AC | PRN
  Administered 2018-07-02: 2 mL

## 2018-07-02 MED ORDER — BUPIVACAINE HCL 0.5 % IJ SOLN
2.0000 mL | INTRAMUSCULAR | Status: AC | PRN
  Administered 2018-07-02: 2 mL via INTRA_ARTICULAR

## 2018-07-02 NOTE — Progress Notes (Signed)
Office Visit Note   Patient: Brandon Fleming           Date of Birth: [birth year]           MRN: [id number] Visit Date: 07/02/2018              Requested by: Prince Solian, MD [address] [place], [place] [address] PCP: Prince Solian, MD   Assessment & Plan: Visit Diagnoses:  1. Chronic right shoulder pain     Plan: MRI scan right shoulder.  Inject subacromial cortisone injection today for pain relief.  I think he may have a rotator cuff tear  Follow-Up Instructions: Return after MRI right shoulder.   Orders:  Orders Placed This Encounter  Procedures  . Large Joint Inj: R subacromial bursa   No orders of the defined types were placed in this encounter.     Procedures: Large Joint Inj: R subacromial bursa on 07/02/2018 11:56 AM Indications: pain and diagnostic evaluation Details: 25 G 1.5 in needle, anterolateral approach  Arthrogram: No  Medications: 2 mL lidocaine 2 %; 2 mL bupivacaine 0.5 %; 80 mg methylPREDNISolone acetate 40 MG/ML Consent was given by the patient. Immediately prior to procedure a time out was called to verify the correct patient, procedure, equipment, support staff and site/side marked as required. Patient was prepped and draped in the usual sterile fashion.       Clinical Data: No additional findings.   Subjective: Chief Complaint  Patient presents with  . Follow-up    R SHOULDER PAIN GOTTEN WORSE OVER LAST MO.  Mr. Blanford has been seen on several occasions in the past for bilateral shoulder pain.  Previous cortisone injection left shoulder has been very effective "long-lasting".  I injected the subacromial space of his right shoulder at 4 months ago with recurrent pain to the point he is having difficulty raising his arm over his head.  No injury or trauma. Has had successful lumbar spine surgery with instrumentation with relief of his back and leg pain  HPI  Review of Systems  Constitutional: Negative for fatigue and fever.    HENT: Negative for ear pain.   Eyes: Negative for pain.  Respiratory: Negative for cough and shortness of breath.   Cardiovascular: Negative for leg swelling.  Gastrointestinal: Negative for constipation and diarrhea.  Genitourinary: Negative for difficulty urinating.  Musculoskeletal: Negative for back pain and neck pain.  Skin: Negative for rash.  Allergic/Immunologic: Negative for food allergies.  Neurological: Positive for weakness. Negative for numbness.  Hematological: Does not bruise/bleed easily.  Psychiatric/Behavioral: Positive for sleep disturbance.     Objective: Vital Signs: BP 128/78 (BP Location: Left Arm, Patient Position: Sitting, Cuff Size: Normal)   Pulse 72   Ht 5\' 6"  (1.676 m)   Wt 160 lb (72.6 kg)   BMI 25.82 kg/m   Physical Exam  Constitutional: He is oriented to person, place, and time. He appears well-developed and well-nourished.  HENT:  Mouth/Throat: Oropharynx is clear and moist.  Eyes: Pupils are equal, round, and reactive to light. EOM are normal.  Pulmonary/Chest: Effort normal.  Neurological: He is alert and oriented to person, place, and time.  Skin: Skin is warm and dry.  Psychiatric: He has a normal mood and affect. His behavior is normal.    Ortho Exam awake alert and oriented x3.  Comfortable sitting.  Right shoulder is quite uncomfortable attempted overhead motion.  I could carefully place his arm over his head but he could not perform that  activity actively related to pain.  Positive impingement.  No crepitation.  Skin intact.  Biceps intact.  Some pain along the anterior subacromial region Specialty Comments:  No specialty comments available.  Imaging: No results found.   PMFS History: Patient Active Problem List   Diagnosis Date Noted  . Dislocated intraocular lens 05/18/2018  . Dislocated intraocular lens, sequela 05/05/2018  . Spondylolisthesis of lumbar region 05/05/2018  . Memory difficulties 04/08/2018  . Gait  abnormality 04/08/2018  . Alcohol withdrawal ([place]) 02/28/2018  . Weakness 02/26/2018  . Essential hypertension 02/26/2018  . HLD (hyperlipidemia) 02/26/2018  . Tremors of nervous system 02/26/2018  . Alcohol abuse 02/26/2018  . Weakness generalized 02/26/2018   Past Medical History:  Diagnosis Date  . Alcohol abuse   . Gait abnormality 04/08/2018  . Hyperlipidemia   . Hypertension   . Kidney damage   . Memory difficulties 04/08/2018    Family History  Problem Relation Age of Onset  . CAD Father   . Kidney disease Father   . Parkinson's disease Mother   . Anesthesia problems Neg Hx   . Broken bones Neg Hx   . Cancer Neg Hx   . Clotting disorder Neg Hx   . Collagen disease Neg Hx   . Diabetes Neg Hx   . Dislocations Neg Hx   . Osteoporosis Neg Hx   . Rheumatologic disease Neg Hx   . Scoliosis Neg Hx   . Severe sprains Neg Hx     Past Surgical History:  Procedure Laterality Date  . BACK SURGERY    . COLONOSCOPY     no polyps  . COLONOSCOPY W/ POLYPECTOMY    . KNEE SURGERY Left 2011   fracture  . LASER PHOTO ABLATION Left 05/18/2018   Procedure: LASER PHOTO ABLATION;  Surgeon: Hayden Pedro, MD;  Location: [facility];  Service: Ophthalmology;  Laterality: Left;  . PARS PLANA VITRECTOMY Left 05/18/2018   Procedure: PARS PLANA VITRECTOMY WITH 25G REMOVAL/SUTURE INTRAOCULAR LENS;  Surgeon: Hayden Pedro, MD;  Location: [facility];  Service: Ophthalmology;  Laterality: Left;  [name] Kitchen VITRECTOMY Left 05/18/2018   PARS PLANA VITRECTOMY WITH 25G REMOVAL/SUTURE INTRAOCULAR LENS (Left)   Social History   Occupational History  . Occupation: Retired  Tobacco Use  . Smoking status: Former Smoker    Types: Cigarettes    Last attempt to quit: 1978    Years since quitting: 41.5  . Smokeless tobacco: Never Used  Substance and Sexual Activity  . Alcohol use: Yes    Comment: 8-10 ounces daily  . Drug use: No  . Sexual activity: Not on file

## 2018-07-05 ENCOUNTER — Ambulatory Visit (INDEPENDENT_AMBULATORY_CARE_PROVIDER_SITE_OTHER): Payer: Medicare Other | Admitting: Orthopaedic Surgery

## 2018-07-09 DIAGNOSIS — H5231 Anisometropia: Secondary | ICD-10-CM | POA: Diagnosis not present

## 2018-07-09 DIAGNOSIS — Z961 Presence of intraocular lens: Secondary | ICD-10-CM | POA: Diagnosis not present

## 2018-07-20 DIAGNOSIS — Z86010 Personal history of colonic polyps: Secondary | ICD-10-CM | POA: Diagnosis not present

## 2018-07-20 DIAGNOSIS — R112 Nausea with vomiting, unspecified: Secondary | ICD-10-CM | POA: Diagnosis not present

## 2018-07-20 DIAGNOSIS — R197 Diarrhea, unspecified: Secondary | ICD-10-CM | POA: Diagnosis not present

## 2018-08-13 DIAGNOSIS — R197 Diarrhea, unspecified: Secondary | ICD-10-CM | POA: Diagnosis not present

## 2018-08-13 DIAGNOSIS — K5730 Diverticulosis of large intestine without perforation or abscess without bleeding: Secondary | ICD-10-CM | POA: Diagnosis not present

## 2018-08-13 DIAGNOSIS — D126 Benign neoplasm of colon, unspecified: Secondary | ICD-10-CM | POA: Diagnosis not present

## 2018-08-13 DIAGNOSIS — D175 Benign lipomatous neoplasm of intra-abdominal organs: Secondary | ICD-10-CM | POA: Diagnosis not present

## 2018-08-13 DIAGNOSIS — K648 Other hemorrhoids: Secondary | ICD-10-CM | POA: Diagnosis not present

## 2018-08-13 DIAGNOSIS — R112 Nausea with vomiting, unspecified: Secondary | ICD-10-CM | POA: Diagnosis not present

## 2018-08-13 DIAGNOSIS — Z86010 Personal history of colonic polyps: Secondary | ICD-10-CM | POA: Diagnosis not present

## 2018-08-18 DIAGNOSIS — D126 Benign neoplasm of colon, unspecified: Secondary | ICD-10-CM | POA: Diagnosis not present

## 2018-08-20 ENCOUNTER — Other Ambulatory Visit: Payer: Self-pay | Admitting: Gastroenterology

## 2018-08-20 DIAGNOSIS — R197 Diarrhea, unspecified: Secondary | ICD-10-CM

## 2018-08-20 DIAGNOSIS — R627 Adult failure to thrive: Secondary | ICD-10-CM

## 2018-08-21 DIAGNOSIS — Z23 Encounter for immunization: Secondary | ICD-10-CM | POA: Diagnosis not present

## 2018-08-25 ENCOUNTER — Encounter (INDEPENDENT_AMBULATORY_CARE_PROVIDER_SITE_OTHER): Payer: Medicare Other | Admitting: Ophthalmology

## 2018-08-25 DIAGNOSIS — H353111 Nonexudative age-related macular degeneration, right eye, early dry stage: Secondary | ICD-10-CM | POA: Diagnosis not present

## 2018-08-25 DIAGNOSIS — I10 Essential (primary) hypertension: Secondary | ICD-10-CM | POA: Diagnosis not present

## 2018-08-25 DIAGNOSIS — H2702 Aphakia, left eye: Secondary | ICD-10-CM | POA: Diagnosis not present

## 2018-08-25 DIAGNOSIS — H43811 Vitreous degeneration, right eye: Secondary | ICD-10-CM | POA: Diagnosis not present

## 2018-08-25 DIAGNOSIS — H35033 Hypertensive retinopathy, bilateral: Secondary | ICD-10-CM | POA: Diagnosis not present

## 2018-08-27 ENCOUNTER — Ambulatory Visit
Admission: RE | Admit: 2018-08-27 | Discharge: 2018-08-27 | Disposition: A | Discharge status: Home / Self Care | Payer: Medicare Other | Source: Ambulatory Visit | Attending: Gastroenterology | Admitting: Gastroenterology

## 2018-08-27 DIAGNOSIS — N281 Cyst of kidney, acquired: Secondary | ICD-10-CM | POA: Diagnosis not present

## 2018-08-27 DIAGNOSIS — R197 Diarrhea, unspecified: Secondary | ICD-10-CM

## 2018-08-27 DIAGNOSIS — R627 Adult failure to thrive: Secondary | ICD-10-CM

## 2018-08-27 MED ORDER — IOPAMIDOL (ISOVUE-300) INJECTION 61%
100.0000 mL | Freq: Once | INTRAVENOUS | Status: AC | PRN
  Administered 2018-08-27: 100 mL via INTRAVENOUS

## 2018-08-31 ENCOUNTER — Telehealth: Payer: Self-pay | Admitting: Neurology

## 2018-08-31 MED ORDER — DONEPEZIL HCL 10 MG PO TABS: 5.0000 mg | ORAL_TABLET | Freq: Every day | ORAL | 3 refills | Status: DC

## 2018-08-31 NOTE — Telephone Encounter (Signed)
Pts wife Beverley(on DPR) called stating hat the pts GI doc would like the pt to come off of donepezil (ARICEPT) 10 MG tablet due to causing s/e such has nauseated/diarrhea for about a month. Please advise

## 2018-08-31 NOTE — Telephone Encounter (Signed)
I called the patient.  I talk with the wife.  The patient had a lot of diarrhea on Aricept, they are to cut back to 5 mg daily, if this does not result in an improvement in the diarrhea they will stop the medication and call me.

## 2018-10-05 DIAGNOSIS — M21371 Foot drop, right foot: Secondary | ICD-10-CM | POA: Diagnosis not present

## 2018-10-05 DIAGNOSIS — Z6825 Body mass index (BMI) 25.0-25.9, adult: Secondary | ICD-10-CM | POA: Diagnosis not present

## 2018-10-05 DIAGNOSIS — M4316 Spondylolisthesis, lumbar region: Secondary | ICD-10-CM | POA: Diagnosis not present

## 2018-10-06 ENCOUNTER — Encounter: Payer: Self-pay | Admitting: Orthopaedic Surgery

## 2018-10-06 DIAGNOSIS — Z125 Encounter for screening for malignant neoplasm of prostate: Secondary | ICD-10-CM | POA: Diagnosis not present

## 2018-10-06 DIAGNOSIS — R82998 Other abnormal findings in urine: Secondary | ICD-10-CM | POA: Diagnosis not present

## 2018-10-06 DIAGNOSIS — E7849 Other hyperlipidemia: Secondary | ICD-10-CM | POA: Diagnosis not present

## 2018-10-06 DIAGNOSIS — I10 Essential (primary) hypertension: Secondary | ICD-10-CM | POA: Diagnosis not present

## 2018-10-06 DIAGNOSIS — R7301 Impaired fasting glucose: Secondary | ICD-10-CM | POA: Diagnosis not present

## 2018-10-06 DIAGNOSIS — Z0000 Encounter for general adult medical examination without abnormal findings: Secondary | ICD-10-CM | POA: Diagnosis not present

## 2018-10-07 DIAGNOSIS — G5731 Lesion of lateral popliteal nerve, right lower limb: Secondary | ICD-10-CM | POA: Diagnosis not present

## 2018-10-08 ENCOUNTER — Ambulatory Visit (INDEPENDENT_AMBULATORY_CARE_PROVIDER_SITE_OTHER): Payer: Medicare Other | Admitting: Neurology

## 2018-10-08 ENCOUNTER — Encounter: Payer: Self-pay | Admitting: Neurology

## 2018-10-08 VITALS — BP 102/50 | HR 62 | Ht 66.0 in | Wt 162.5 lb

## 2018-10-08 DIAGNOSIS — R413 Other amnesia: Secondary | ICD-10-CM | POA: Diagnosis not present

## 2018-10-08 DIAGNOSIS — R269 Unspecified abnormalities of gait and mobility: Secondary | ICD-10-CM | POA: Diagnosis not present

## 2018-10-08 MED ORDER — MEMANTINE HCL 5 MG PO TABS: ORAL_TABLET | ORAL | 0 refills | Status: DC

## 2018-10-08 NOTE — Progress Notes (Signed)
Faxed printed/signed rx memantine 5mg  tab to [facility] at [phone]. Received fax confirmation.

## 2018-10-08 NOTE — Progress Notes (Signed)
Reason for visit: Mild memory disturbance  Brandon Fleming is an 72 y.o. male  History of present illness:  Brandon Fleming is a 72 year old right-handed white male with a history of alcohol abuse and history of gait instability.  The patient has undergone lumbosacral spine surgery on 05 May 2018 at the L3-4 and L4-5 levels.  The patient has developed over the last several months some discomfort in the hips on both sides with pain going into the groin area and down into the anterior thighs.  The patient has pain with weightbearing, it goes away when he sits down or lies down.  He may have popping in the right hip joint when he first gets up.  The patient has developed a mild right foot drop over the last 3 weeks, he had EMG and nerve conduction study evaluation done yesterday, this was done by Dr. Brien Few, but the results of this study are not available to me.  The patient had been placed on Aricept, he went off the medication secondary to diarrhea, he returns to this office for an evaluation.   Past Medical History:  Diagnosis Date  . Alcohol abuse   . Gait abnormality 04/08/2018  . Hyperlipidemia   . Hypertension   . Kidney damage   . Memory difficulties 04/08/2018    Past Surgical History:  Procedure Laterality Date  . BACK SURGERY    . COLONOSCOPY     no polyps  . COLONOSCOPY W/ POLYPECTOMY    . KNEE SURGERY Left 2011   fracture  . LASER PHOTO ABLATION Left 05/18/2018   Procedure: LASER PHOTO ABLATION;  Surgeon: Hayden Pedro, MD;  Location: [facility];  Service: Ophthalmology;  Laterality: Left;  . PARS PLANA VITRECTOMY Left 05/18/2018   Procedure: PARS PLANA VITRECTOMY WITH 25G REMOVAL/SUTURE INTRAOCULAR LENS;  Surgeon: Hayden Pedro, MD;  Location: [facility];  Service: Ophthalmology;  Laterality: Left;  [name] Kitchen VITRECTOMY Left 05/18/2018   PARS PLANA VITRECTOMY WITH 25G REMOVAL/SUTURE INTRAOCULAR LENS (Left)    Family History  Problem Relation Age of Onset  . CAD Father   . Kidney  disease Father   . Parkinson's disease Mother   . Anesthesia problems Neg Hx   . Broken bones Neg Hx   . Cancer Neg Hx   . Clotting disorder Neg Hx   . Collagen disease Neg Hx   . Diabetes Neg Hx   . Dislocations Neg Hx   . Osteoporosis Neg Hx   . Rheumatologic disease Neg Hx   . Scoliosis Neg Hx   . Severe sprains Neg Hx     Social history:  reports that he quit smoking about 41 years ago. His smoking use included cigarettes. He has never used smokeless tobacco. He reports that he drinks alcohol. He reports that he does not use drugs.    Allergies  Allergen Reactions  . Atropine Swelling and Other (See Comments)    Eyes swollen and red  . Aricept [Donepezil Hcl]     Diarrhea, stomach problems.     Medications:  Prior to Admission medications   Medication Sig Start Date End Date Taking? Authorizing Provider  amLODipine (NORVASC) 5 MG tablet Take 5 mg by mouth daily.  11/02/17  Yes [provider]  aspirin EC 81 MG tablet Take 81 mg by mouth daily.   Yes [provider]  esomeprazole (NEXIUM) 20 MG capsule Take 40 mg by mouth daily at 12 noon.    Yes [provider]  fenofibrate (TRICOR) 145 MG tablet Take 145 mg by mouth daily. 02/17/18  Yes [provider]  FLUoxetine (PROZAC) 40 MG capsule Take 40 mg by mouth daily. 03/03/18  Yes [provider]  folic acid (FOLVITE) 1 MG tablet Take 1 mg by mouth daily.   Yes [provider]  lisinopril (PRINIVIL,ZESTRIL) 20 MG tablet Take 20 mg by mouth daily.  12/22/13  Yes [provider]  Multiple Vitamins-Minerals (MULTIVITAMIN WITH MINERALS) tablet Take 1 tablet by mouth daily.   Yes [provider]  rosuvastatin (CRESTOR) 10 MG tablet Take 10 mg by mouth daily.  12/28/13  Yes [provider]  sildenafil (VIAGRA) 100 MG tablet Take 100 mg by mouth daily as needed for erectile dysfunction.   Yes [provider]  thiamine 100 MG tablet Take 100 mg by  mouth daily.   Yes [provider]    ROS:  Out of a complete 14 system review of symptoms, the patient complains only of the following symptoms, and all other reviewed systems are negative.  Back pain, walking difficulty, neck pain Memory loss, weakness  Blood pressure (!) 102/50, pulse 62, height 5\' 6"  (1.676 m), weight 162 lb 8 oz (73.7 kg), SpO2 97 %.  Physical Exam  General: The patient is alert and cooperative at the time of the examination.  Skin: No significant peripheral edema is noted.   Neurologic Exam  Mental status: The patient is alert and oriented x 3 at the time of the examination. The patient has apparent normal recent and remote memory, with an apparently normal attention span and concentration ability.   Cranial nerves: Facial symmetry is present. Speech is normal, no aphasia or dysarthria is noted. Extraocular movements are full. Visual fields are full.  Motor: The patient has good strength in all 4 extremities, with exception of a mild right foot drop, some weakness with eversion of the right foot.  Sensory examination: Soft touch sensation is symmetric on the face, arms, and legs.  Coordination: The patient has good finger-nose-finger and heel-to-shin bilaterally.  Gait and station: The patient has a normal gait. Tandem gait is unsteady. Romberg is negative. No drift is seen.  Reflexes: Deep tendon reflexes are symmetric, with exception that the left ankle jerk reflex was slightly depressed relative to the right.   CT head 04/21/18:  IMPRESSION: Favorable evolutionary changes. Low-density right-sided subdural collection now measured at 3.5 mm in thickness where it was previously measured at 5.5 mm in thickness. Measured in the coronal plane, maximal thickness is 7 mm as compared to 9 mm previously. Slightly less mass effect.  * CT scan images were reviewed online. I agree with the written report.    Assessment/Plan:  1.  Mild memory  disturbance  2.  Recent lumbar sacral spine surgery  3.  Bilateral hip discomfort, possible hip arthritis  4.  Mild right foot drop  The patient has had EMG nerve conduction study evaluation, I will review the results of this once I have obtained the report.  The patient will be placed on Namenda, he will work up on the dose and call after a month for a maintenance dose prescription.  He will follow-up in 6 months, we will continue to follow the memory issues.  Jill Alexanders MD 10/08/2018 11:09 AM  [facility] [address] [place], [place] [address]  Phone [phone] Fax [phone]

## 2018-10-13 ENCOUNTER — Encounter: Payer: Self-pay | Admitting: Orthopaedic Surgery

## 2018-10-14 ENCOUNTER — Telehealth: Payer: Self-pay | Admitting: Neurology

## 2018-10-14 NOTE — Telephone Encounter (Signed)
I have received recent blood work results from this patient through the primary care physician, the studies were done on 06 October 2018.  Hemoglobin A1c is 5.1, glucose of 110, BUN of 18, creatinine of 1.0, sodium 144, potassium 4.2, chloride 107, CO2 25, calcium 9.5, total protein 6.8, albumin 3.7, liver profile is unremarkable.  White blood count of 6.83, hemoglobin of 12.4, hematocrit 39.8, MCV of 97.6, platelets of 263.  Total cholesterol 194, triglycerides 77, HDL 82, LDL of 97, TSH of 1.71.  PSA of 0.519.

## 2018-10-19 DIAGNOSIS — Z1212 Encounter for screening for malignant neoplasm of rectum: Secondary | ICD-10-CM | POA: Diagnosis not present

## 2018-11-21 ENCOUNTER — Other Ambulatory Visit: Payer: Self-pay | Admitting: Neurology

## 2019-02-15 DIAGNOSIS — M4316 Spondylolisthesis, lumbar region: Secondary | ICD-10-CM | POA: Diagnosis not present

## 2019-02-15 DIAGNOSIS — I10 Essential (primary) hypertension: Secondary | ICD-10-CM | POA: Diagnosis not present

## 2019-02-16 DIAGNOSIS — I10 Essential (primary) hypertension: Secondary | ICD-10-CM | POA: Diagnosis not present

## 2019-02-16 DIAGNOSIS — Z6826 Body mass index (BMI) 26.0-26.9, adult: Secondary | ICD-10-CM | POA: Diagnosis not present

## 2019-02-28 DIAGNOSIS — I129 Hypertensive chronic kidney disease with stage 1 through stage 4 chronic kidney disease, or unspecified chronic kidney disease: Secondary | ICD-10-CM | POA: Diagnosis not present

## 2019-04-04 ENCOUNTER — Other Ambulatory Visit: Payer: Self-pay

## 2019-04-04 MED ORDER — MEMANTINE HCL 5 MG PO TABS: ORAL_TABLET | ORAL | 1 refills | Status: DC

## 2019-04-12 ENCOUNTER — Telehealth: Payer: Self-pay | Admitting: Neurology

## 2019-04-12 NOTE — Telephone Encounter (Signed)
Noted, E-mail link for [email or web address] visit has been sent to [email or web address]  Pre charting will be completed earlier to appt date.

## 2019-04-12 NOTE — Telephone Encounter (Signed)
Pts wife consented to a Virtual Visit and for the insurance to be billed as such. Email in chart has been confirmed.

## 2019-04-18 NOTE — Telephone Encounter (Signed)
I contacted the pt's wife Rise Paganini ( ok per dpr) and was able to update the pt's chart for 04/19/19 visit.  Allergies, pmh and meds have been updated.  Wife reports the pt is only eating one meal daily (dinner most of the time). She states the pt has been more depressed and cranky but states this could be related to the covid 19 restrictions. She states she has noticed some weakness in bilateral legs and confirmed the pt has had a few falls. One of his falls resulted in two teeth becoming loose and dental work was needed. Pt is not exercising at this time.

## 2019-04-19 ENCOUNTER — Other Ambulatory Visit: Payer: Self-pay

## 2019-04-19 ENCOUNTER — Ambulatory Visit (INDEPENDENT_AMBULATORY_CARE_PROVIDER_SITE_OTHER): Payer: Medicare Other | Admitting: Neurology

## 2019-04-19 ENCOUNTER — Encounter: Payer: Self-pay | Admitting: Neurology

## 2019-04-19 DIAGNOSIS — F1010 Alcohol abuse, uncomplicated: Secondary | ICD-10-CM

## 2019-04-19 DIAGNOSIS — R269 Unspecified abnormalities of gait and mobility: Secondary | ICD-10-CM

## 2019-04-19 DIAGNOSIS — R413 Other amnesia: Secondary | ICD-10-CM

## 2019-04-19 MED ORDER — MEMANTINE HCL 10 MG PO TABS: 10.0000 mg | ORAL_TABLET | Freq: Two times a day (BID) | ORAL | 3 refills | Status: DC

## 2019-04-19 MED ORDER — MIRTAZAPINE 15 MG PO TABS: 15.0000 mg | ORAL_TABLET | Freq: Every day | ORAL | 1 refills | Status: DC

## 2019-04-19 NOTE — Progress Notes (Signed)
     Virtual Visit via Video Note  I connected with Brandon Fleming on 04/19/19 at 11:30 AM EDT by a video enabled telemedicine application and verified that I am speaking with the correct person using two identifiers.  Location: Patient: The patient is in [facility] Provider: Physician in office.   I discussed the limitations of evaluation and management by telemedicine and the availability of in person appointments. The patient expressed understanding and agreed to proceed.  History of Present Illness: Brandon Fleming is a 73 year old right-handed white male with a history of alcohol overuse who has a mild memory disturbance and a gait disturbance.  The patient is on Namenda taking 10 mg twice daily, he is tolerating this well.  He did not tolerate Aricept secondary to diarrhea.  He has had a reduction in appetite, he has lost about 10 pounds over the last 6 months, he usually eats only once a day.  He has become very inactive, he has not been walking much since his low back surgery.  He fell about a month ago when he slipped out of his shoes and fell down face forward, he did not go to the hospital.  He has a cane but he does not use this.  He has not been sleeping well at night, he is having bad dreams and he is up and down all night long.  The patient is still drinking about 3-4 alcoholic beverages daily.  The memory has progressed slightly, he is repeating things more frequently, he has to take notes in order remember to do things.  He continues to do his usual activities daily living, he does drive a car.   Observations/Objective: The video evaluation reveals that the patient is alert and cooperative.  He has a normal speech pattern with no evidence of aphasia or dysarthria.  He has a symmetric face, he is able to protrude the tongue in the midline with good lateral movement of the tongue.  He has good finger-nose-finger and heel shin bilaterally.  Gait is unremarkable, but tandem gait is  unsteady.  Romberg is negative.  The [facility]-blind evaluation reveals a total score of 19/22.  Assessment and Plan: 1.  Mild memory disturbance  2.  Gait disturbance  3.  History of alcohol overuse  4.  Fatigue, insomnia, weight loss  The patient is having some issues with not sleeping well, he is extremely inactive.  I indicated that this may increase fatigue.  He is to try to exercise on a regular basis, I will add low-dose mirtazapine at night 15 mg.  He will drop his Prozac dose down to 40 mg daily.  The patient will call for any dose adjustments of the medication.  He will remain on Namenda.  He will follow-up in about 6 months.   Follow Up Instructions: 4-month follow-up, may see nurse practitioner.   I discussed the assessment and treatment plan with the patient. The patient was provided an opportunity to ask questions and all were answered. The patient agreed with the plan and demonstrated an understanding of the instructions.   The patient was advised to call back or seek an in-person evaluation if the symptoms worsen or if the condition fails to improve as anticipated.  I provided 25 minutes of non-face-to-face time during this encounter.   Kathrynn Ducking, MD

## 2019-04-28 DIAGNOSIS — M542 Cervicalgia: Secondary | ICD-10-CM | POA: Diagnosis not present

## 2019-05-17 ENCOUNTER — Telehealth: Payer: Self-pay | Admitting: Neurology

## 2019-05-17 MED ORDER — MIRTAZAPINE 30 MG PO TABS: 30.0000 mg | ORAL_TABLET | Freq: Every day | ORAL | 3 refills | Status: DC

## 2019-05-17 MED ORDER — FLUOXETINE HCL 20 MG PO CAPS: 20.0000 mg | ORAL_CAPSULE | Freq: Every day | ORAL | 3 refills | Status: DC

## 2019-05-17 NOTE — Telephone Encounter (Signed)
I called and left a message.  If the Remeron is well tolerated we can go up to 30 mg at night and reduce the Prozac to 20 mg daily.  If they are amenable to this, they are to contact our office.

## 2019-05-17 NOTE — Addendum Note (Signed)
Addended by: Kathrynn Ducking on: 05/17/2019 11:02 AM   Modules accepted: Orders

## 2019-05-17 NOTE — Telephone Encounter (Signed)
I will go ahead and send in prescriptions for the Prozac and for the Remeron.

## 2019-05-17 NOTE — Telephone Encounter (Signed)
Pt's wife returned call and stated that they amenable to the changes and they use [facility].

## 2019-05-17 NOTE — Telephone Encounter (Signed)
Pt's wife states that the mirtazapine (REMERON) 15 MG tablet is working great for the pt except for the sleep. Pt has not been able to sleep well. She would like to know what can be advised to do about that.

## 2019-05-26 ENCOUNTER — Other Ambulatory Visit: Payer: Self-pay

## 2019-05-26 MED ORDER — MIRTAZAPINE 30 MG PO TABS: 30.0000 mg | ORAL_TABLET | Freq: Every day | ORAL | 1 refills | Status: DC

## 2019-05-31 ENCOUNTER — Other Ambulatory Visit: Payer: Self-pay

## 2019-05-31 ENCOUNTER — Telehealth: Payer: Self-pay | Admitting: Neurology

## 2019-05-31 MED ORDER — MIRTAZAPINE 30 MG PO TABS: 30.0000 mg | ORAL_TABLET | Freq: Every day | ORAL | 0 refills | Status: DC

## 2019-05-31 MED ORDER — MIRTAZAPINE 30 MG PO TABS: 30.0000 mg | ORAL_TABLET | Freq: Every day | ORAL | 1 refills | Status: DC

## 2019-05-31 NOTE — Addendum Note (Signed)
Addended by: Verlin Grills T on: 05/31/2019 01:43 PM   Modules accepted: Orders

## 2019-05-31 NOTE — Telephone Encounter (Signed)
30 day supply submitted.

## 2019-05-31 NOTE — Telephone Encounter (Signed)
Wife is asking for a refill on pt's mirtazapine (REMERON) 30 MG tablet  [facility] .  Wife is asking if a month's supply can be called into [facility]  While waiting on the order from [facility] .

## 2019-07-12 DIAGNOSIS — G8929 Other chronic pain: Secondary | ICD-10-CM | POA: Diagnosis not present

## 2019-07-12 DIAGNOSIS — I10 Essential (primary) hypertension: Secondary | ICD-10-CM | POA: Diagnosis not present

## 2019-07-12 DIAGNOSIS — M4316 Spondylolisthesis, lumbar region: Secondary | ICD-10-CM | POA: Diagnosis not present

## 2019-07-12 DIAGNOSIS — M5441 Lumbago with sciatica, right side: Secondary | ICD-10-CM | POA: Diagnosis not present

## 2019-07-18 DIAGNOSIS — M5441 Lumbago with sciatica, right side: Secondary | ICD-10-CM | POA: Diagnosis not present

## 2019-07-21 DIAGNOSIS — M5116 Intervertebral disc disorders with radiculopathy, lumbar region: Secondary | ICD-10-CM | POA: Diagnosis not present

## 2019-07-21 DIAGNOSIS — M5441 Lumbago with sciatica, right side: Secondary | ICD-10-CM | POA: Diagnosis not present

## 2019-07-21 DIAGNOSIS — M48061 Spinal stenosis, lumbar region without neurogenic claudication: Secondary | ICD-10-CM | POA: Diagnosis not present

## 2019-08-03 DIAGNOSIS — M5416 Radiculopathy, lumbar region: Secondary | ICD-10-CM | POA: Diagnosis not present

## 2019-08-03 DIAGNOSIS — Z6825 Body mass index (BMI) 25.0-25.9, adult: Secondary | ICD-10-CM | POA: Diagnosis not present

## 2019-08-03 DIAGNOSIS — M9973 Connective tissue and disc stenosis of intervertebral foramina of lumbar region: Secondary | ICD-10-CM | POA: Diagnosis not present

## 2019-08-03 DIAGNOSIS — I10 Essential (primary) hypertension: Secondary | ICD-10-CM | POA: Diagnosis not present

## 2019-08-27 DIAGNOSIS — Z23 Encounter for immunization: Secondary | ICD-10-CM | POA: Diagnosis not present

## 2019-09-20 DIAGNOSIS — M48061 Spinal stenosis, lumbar region without neurogenic claudication: Secondary | ICD-10-CM | POA: Diagnosis not present

## 2019-10-01 ENCOUNTER — Other Ambulatory Visit: Payer: Self-pay | Admitting: Neurology

## 2019-10-12 DIAGNOSIS — Z125 Encounter for screening for malignant neoplasm of prostate: Secondary | ICD-10-CM | POA: Diagnosis not present

## 2019-10-12 DIAGNOSIS — E7849 Other hyperlipidemia: Secondary | ICD-10-CM | POA: Diagnosis not present

## 2019-10-12 DIAGNOSIS — R82998 Other abnormal findings in urine: Secondary | ICD-10-CM | POA: Diagnosis not present

## 2019-10-12 DIAGNOSIS — I10 Essential (primary) hypertension: Secondary | ICD-10-CM | POA: Diagnosis not present

## 2019-10-12 DIAGNOSIS — R7301 Impaired fasting glucose: Secondary | ICD-10-CM | POA: Diagnosis not present

## 2019-10-12 DIAGNOSIS — E038 Other specified hypothyroidism: Secondary | ICD-10-CM | POA: Diagnosis not present

## 2019-10-19 NOTE — Progress Notes (Signed)
PATIENT: Brandon Fleming DOB: 07-22-1946  REASON FOR VISIT: follow up HISTORY FROM: patient  HISTORY OF PRESENT ILLNESS: Today 10/20/19  Brandon Fleming is a 73 year old male with history of alcohol overuse and a mild memory disturbance and gait disturbance. He was unable to tolerate Aricept secondary to diarrhea.  He remains on Namenda.  He is taking Remeron which has been helpful for sleep.  His wife has a hard time getting him to take his medications.  He is actually taking Remeron in the morning, along with Prozac.  He indicates his sleeping has improved.  He performs all of his ADLs, and drives a car without difficulty.  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 He has chronic back pain, is scheduled for a lumbar injection tomorrow.  He has started using a recumbent bike 30 minutes a day.  His wife says he only eats 1 meal a day, has about 3-4 bourbon beverages a day.  He has had 2 stumbles in the last several weeks, but no falls.  He feels his memory is stable, but has good and bad days.  They go back and forth to [place] where they have a home, and their grandkids are. He did not take his blood pressure medications today.  He presents today for follow-up accompanied by his wife.  HISTORY  04/19/2019 Dr. Jannifer Franklin: Brandon Fleming is a 73 year old right-handed white male with a history of alcohol overuse who has a mild memory disturbance and a gait disturbance.  The patient is on Namenda taking 10 mg twice daily, he is tolerating this well.  He did not tolerate Aricept secondary to diarrhea.  He has had a reduction in appetite, he has lost about 10 pounds over the last 6 months, he usually eats only once a day.  He has become very inactive, he has not been walking much since his low back surgery.  He fell about a month ago when he slipped out of his shoes and fell down face forward, he did not go to the hospital.  He has a cane but he does not use this.  He has not been sleeping well at  night, he is having bad dreams and he is up and down all night long.  The patient is still drinking about 3-4 alcoholic beverages daily.  The memory has progressed slightly, he is repeating things more frequently, he has to take notes in order remember to do things.  He continues to do his usual activities daily living, he does drive a car.  REVIEW OF SYSTEMS: Out of a complete 14 system review of symptoms, the patient complains only of the following symptoms, and all other reviewed systems are negative.  Memory loss  ALLERGIES: Allergies  Allergen Reactions  . Atropine Swelling and Other (See Comments)    Eyes swollen and red  . Aricept [Donepezil Hcl]     Diarrhea, stomach problems.     HOME MEDICATIONS: Outpatient Medications Prior to Visit  Medication Sig Dispense Refill  . amLODipine (NORVASC) 5 MG tablet Take 5 mg by mouth daily.     [name] Kitchen aspirin EC 81 MG tablet Take 81 mg by mouth daily.    . cyclobenzaprine (FLEXERIL) 10 MG tablet Take 10 mg by mouth 3 (three) times daily as needed for muscle spasms.    . fenofibrate (TRICOR) 145 MG tablet Take 145 mg by mouth daily.    [name] Kitchen FLUoxetine (PROZAC) 20 MG capsule Take 1 capsule (20  mg total) by mouth daily. 30 capsule 3  . folic acid (FOLVITE) 1 MG tablet Take 1 mg by mouth daily.    [name] Kitchen lisinopril (PRINIVIL,ZESTRIL) 20 MG tablet Take 20 mg by mouth daily.     . memantine (NAMENDA) 10 MG tablet Take 1 tablet (10 mg total) by mouth 2 (two) times daily. 180 tablet 3  . mirtazapine (REMERON) 30 MG tablet TAKE 1 TABLET BY MOUTH AT  BEDTIME 90 tablet 3  . Multiple Vitamins-Minerals (MULTIVITAMIN WITH MINERALS) tablet Take 1 tablet by mouth daily.    . pantoprazole (PROTONIX) 40 MG tablet Take 40 mg by mouth daily.    . rosuvastatin (CRESTOR) 10 MG tablet Take 10 mg by mouth daily.     [name] Kitchen thiamine 100 MG tablet Take 100 mg by mouth daily.    . traMADol (ULTRAM) 50 MG tablet Take by mouth every 6 (six) hours as needed.    [name] Kitchen esomeprazole (NEXIUM)  20 MG capsule Take 40 mg by mouth daily at 12 noon.      No facility-administered medications prior to visit.     PAST MEDICAL HISTORY: Past Medical History:  Diagnosis Date  . Alcohol abuse   . Gait abnormality 04/08/2018  . Hyperlipidemia   . Hypertension   . Kidney damage   . Memory difficulties 04/08/2018    PAST SURGICAL HISTORY: Past Surgical History:  Procedure Laterality Date  . BACK SURGERY    . COLONOSCOPY     no polyps  . COLONOSCOPY W/ POLYPECTOMY    . KNEE SURGERY Left 2011   fracture  . LASER PHOTO ABLATION Left 05/18/2018   Procedure: LASER PHOTO ABLATION;  Surgeon: Hayden Pedro, MD;  Location: [facility];  Service: Ophthalmology;  Laterality: Left;  . PARS PLANA VITRECTOMY Left 05/18/2018   Procedure: PARS PLANA VITRECTOMY WITH 25G REMOVAL/SUTURE INTRAOCULAR LENS;  Surgeon: Hayden Pedro, MD;  Location: [facility];  Service: Ophthalmology;  Laterality: Left;  [name] Kitchen VITRECTOMY Left 05/18/2018   PARS PLANA VITRECTOMY WITH 25G REMOVAL/SUTURE INTRAOCULAR LENS (Left)    FAMILY HISTORY: Family History  Problem Relation Age of Onset  . CAD Father   . Kidney disease Father   . Parkinson's disease Mother   . Anesthesia problems Neg Hx   . Broken bones Neg Hx   . Cancer Neg Hx   . Clotting disorder Neg Hx   . Collagen disease Neg Hx   . Diabetes Neg Hx   . Dislocations Neg Hx   . Osteoporosis Neg Hx   . Rheumatologic disease Neg Hx   . Scoliosis Neg Hx   . Severe sprains Neg Hx     SOCIAL HISTORY: Social History   Socioeconomic History  . Marital status: Married    Spouse name: Not on file  . Number of children: 1  . Years of education: 47  . Highest education level: Master's degree (e.g., MA, MS, MEng, MEd, MSW, MBA)  Occupational History  . Occupation: Retired  Scientific laboratory technician  . Financial resource strain: Not on file  . Food insecurity    Worry: Not on file    Inability: Not on file  . Transportation needs    Medical: Not on file    Non-medical: Not on  file  Tobacco Use  . Smoking status: Former Smoker    Types: Cigarettes    Quit date: 1978    Years since quitting: 42.8  . Smokeless tobacco: Never Used  Substance and Sexual Activity  . Alcohol use:  Yes    Comment: 8-10 ounces daily  . Drug use: No  . Sexual activity: Not on file  Lifestyle  . Physical activity    Days per week: Not on file    Minutes per session: Not on file  . Stress: Not on file  Relationships  . Social Herbalist on phone: Not on file    Gets together: Not on file    Attends religious service: Not on file    Active member of club or organization: Not on file    Attends meetings of clubs or organizations: Not on file    Relationship status: Not on file  . Intimate partner violence    Fear of current or ex partner: Not on file    Emotionally abused: Not on file    Physically abused: Not on file    Forced sexual activity: Not on file  Other Topics Concern  . Not on file  Social History Narrative   Lives at home with his wife.   Right-handed.   Caffeine- minimal use (4 ounces tea/soda per day)    PHYSICAL EXAM  Vitals:   10/20/19 1121  BP: (!) 170/100  Pulse: (!) 108  Temp: 97.9 F (36.6 C)  Weight: 169 lb 6.4 oz (76.8 kg)  Height: 5\' 6"  (1.676 m)   Body mass index is 27.34 kg/m.  Generalized: Well developed, in no acute distress, well-appearing MMSE - Mini Mental State Exam 10/20/2019 04/08/2018  Orientation to time 4 3  Orientation to Place 5 4  Registration 3 3  Attention/ Calculation 5 5  Recall 2 1  Language- name 2 objects 2 2  Language- repeat 1 1  Language- follow 3 step command 3 3  Language- read & follow direction 1 1  Write a sentence 1 1  Copy design 1 1  Copy design-comments named 11 animals -  Total score 28 25    Neurological examination  Mentation: Alert oriented to time, place, history taking. Follows all commands speech and language fluent Cranial nerve II-XII: Pupils were equal round reactive to  light. Extraocular movements were full, visual field were full on confrontational test. Facial sensation and strength were normal.  Head turning and shoulder shrug were normal and symmetric. Motor: The motor testing reveals 5 over 5 strength of all 4 extremities. Good symmetric motor tone is noted throughout.  Sensory: Sensory testing is intact to soft touch on all 4 extremities. No evidence of extinction is noted.  Coordination: Cerebellar testing reveals good finger-nose-finger and heel-to-shin bilaterally.  Gait and station: Gait is normal. Tandem gait is unsteady. Reflexes: Deep tendon reflexes are symmetric and normal bilaterally.   DIAGNOSTIC DATA (LABS, IMAGING, TESTING) - I reviewed patient records, labs, notes, testing and imaging myself where available.  Lab Results  Component Value Date   WBC 6.4 05/06/2018   HGB 9.8 (L) 05/06/2018   HCT 29.5 (L) 05/06/2018   MCV 96.7 05/06/2018   PLT 192 05/06/2018      Component Value Date/Time   NA 141 05/06/2018 0624   K 3.4 (L) 05/06/2018 0624   CL 107 05/06/2018 0624   CO2 25 05/06/2018 0624   GLUCOSE 122 (H) 05/06/2018 0624   BUN 17 05/06/2018 0624   CREATININE 1.18 05/06/2018 0624   CALCIUM 8.0 (L) 05/06/2018 0624   PROT 7.0 04/23/2018 1117   ALBUMIN 4.1 04/23/2018 1117   AST 47 (H) 04/23/2018 1117   ALT 26 04/23/2018 1117   ALKPHOS 48  04/23/2018 1117   BILITOT 0.6 04/23/2018 1117   GFRNONAA >60 05/06/2018 0624   GFRAA >60 05/06/2018 0624   No results found for: CHOL, HDL, LDLCALC, LDLDIRECT, TRIG, CHOLHDL No results found for: HGBA1C Lab Results  Component Value Date   VITAMINB12 335 02/26/2018   Lab Results  Component Value Date   TSH 4.465 02/26/2018      ASSESSMENT AND PLAN 73 y.o. year old male  has a past medical history of Alcohol abuse, Gait abnormality (04/08/2018), Hyperlipidemia, Hypertension, Kidney damage, and Memory difficulties (04/08/2018). here with:  1.  Mild memory disturbance 2.  Gait  disturbance 3.  History of alcohol overuse 4.  Fatigue, insomnia  Overall, his memory has remained stable.  His wife has a difficult time getting him to take his medications.  I will convert Namenda to the extended release preparation, allowing for once daily dosing.  He will remain on Remeron and Prozac.  We discussed that Remeron works best for sleep when taken at bedtime.  He has done well to establish a daily exercise regimen of 30 minutes on a recumbent bike.  He will follow-up in 6 months or sooner if needed.  I did advise if his symptoms worsen or if he develops any new symptoms he should let us know.  I spent 15 minutes with the patient. 50% of this time was spent discussing his plan of care.   Butler Denmark, AGNP-C, DNP 10/20/2019, 11:27 AM [facility] [address], [place], [place] [address] [phone]

## 2019-10-20 ENCOUNTER — Ambulatory Visit: Payer: Medicare Other | Admitting: Family Medicine

## 2019-10-20 ENCOUNTER — Ambulatory Visit (INDEPENDENT_AMBULATORY_CARE_PROVIDER_SITE_OTHER): Payer: Medicare Other | Admitting: Neurology

## 2019-10-20 ENCOUNTER — Other Ambulatory Visit: Payer: Self-pay

## 2019-10-20 ENCOUNTER — Encounter: Payer: Self-pay | Admitting: Neurology

## 2019-10-20 VITALS — BP 170/100 | HR 108 | Temp 97.9°F | Ht 66.0 in | Wt 169.4 lb

## 2019-10-20 DIAGNOSIS — R413 Other amnesia: Secondary | ICD-10-CM

## 2019-10-20 MED ORDER — MEMANTINE HCL ER 28 MG PO CP24: 28.0000 mg | ORAL_CAPSULE | Freq: Every day | ORAL | 5 refills | Status: DC

## 2019-10-20 NOTE — Patient Instructions (Signed)
Switch to Namenda XR 28 mg daily, in place of Namenda twice daily. Continue Remeron and Prozac.

## 2019-10-21 DIAGNOSIS — M5416 Radiculopathy, lumbar region: Secondary | ICD-10-CM | POA: Diagnosis not present

## 2019-10-22 NOTE — Progress Notes (Signed)
I have read the note, and I agree with the clinical assessment and plan.  Xia Stohr K Elam Ellis   

## 2019-10-26 DIAGNOSIS — Z1331 Encounter for screening for depression: Secondary | ICD-10-CM | POA: Diagnosis not present

## 2019-11-01 DIAGNOSIS — M545 Low back pain: Secondary | ICD-10-CM | POA: Diagnosis not present

## 2019-11-01 DIAGNOSIS — Z6826 Body mass index (BMI) 26.0-26.9, adult: Secondary | ICD-10-CM | POA: Diagnosis not present

## 2019-11-01 DIAGNOSIS — I10 Essential (primary) hypertension: Secondary | ICD-10-CM | POA: Diagnosis not present

## 2019-11-02 ENCOUNTER — Telehealth: Payer: Self-pay | Admitting: Neurology

## 2019-11-02 NOTE — Telephone Encounter (Signed)
I received laboratory evaluation from [facility] on 10/31/2019 creatinine was 1.8, AST 72, normal ALT, cholesterol 250, TSH 5.56 high, A1c 5.3, urine was positive for microalbumin.

## 2019-12-09 IMAGING — CT CT HEAD W/O CM
3 series · 15 of 47 positions shown, 18 images · non-contrast
Comparison: None.

CLINICAL DATA: Altered mental status

EXAM:
CT HEAD WITHOUT CONTRAST
TECHNIQUE: Contiguous axial images were obtained from the base of the skull
through the vertex without intravenous contrast.

[Series 2: head wo · axial · 0.47mm/px · z∈[+1579,+1704]mm · 9 of 31 slices shown, 12 images]
[im 3/31  brain]
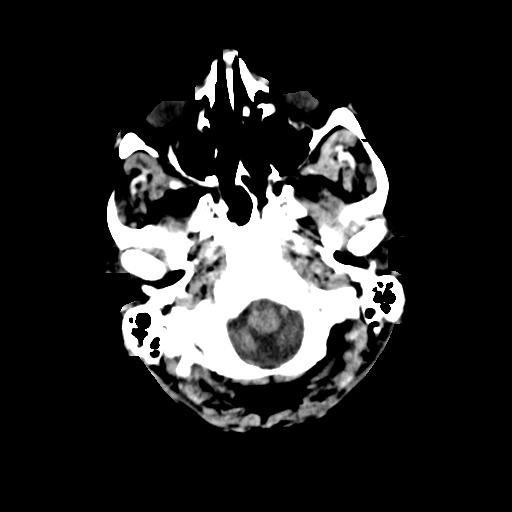
[im 3/31  bone]
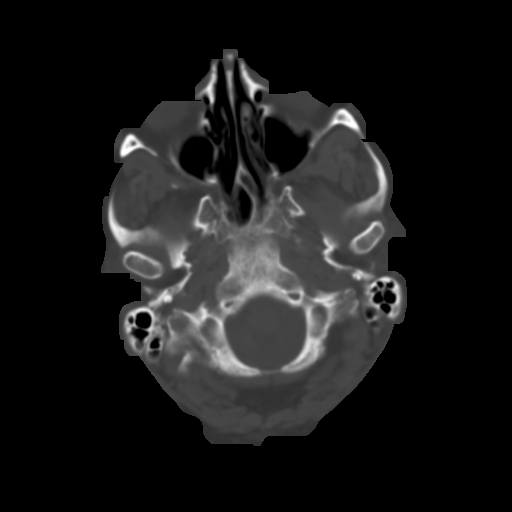
[im 6/31  brain]
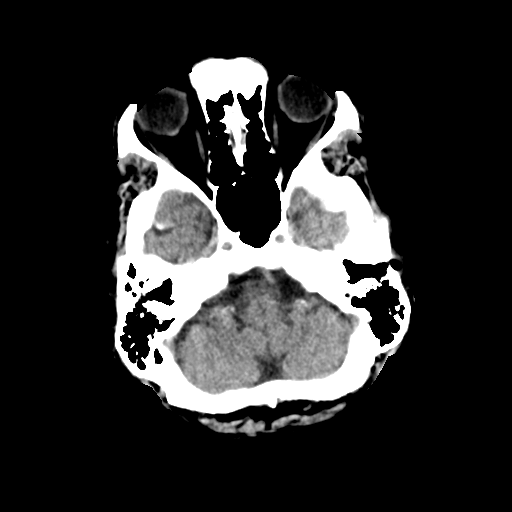
[im 9/31  brain]
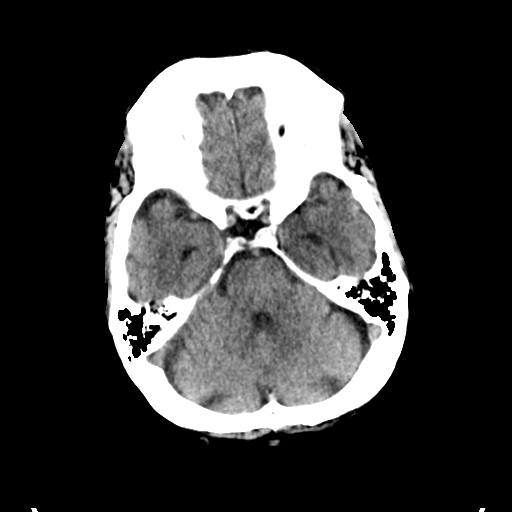
[im 12/31  brain]
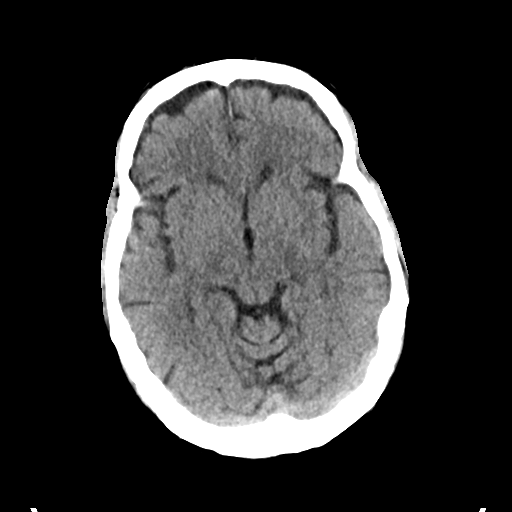
[im 16/31  brain]
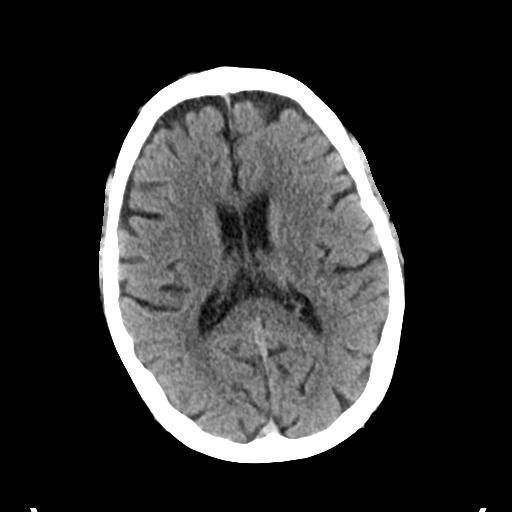
[im 16/31  bone]
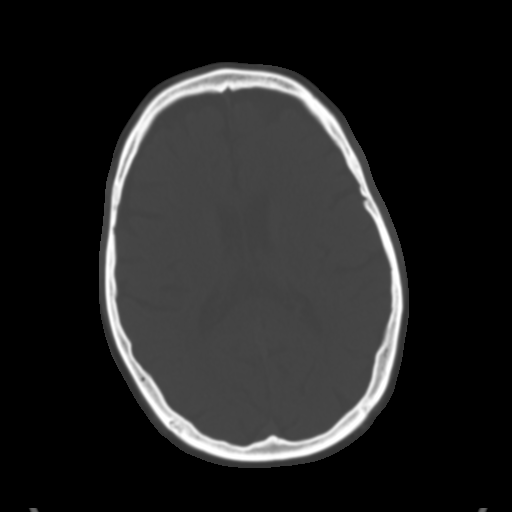
[im 19/31  brain]
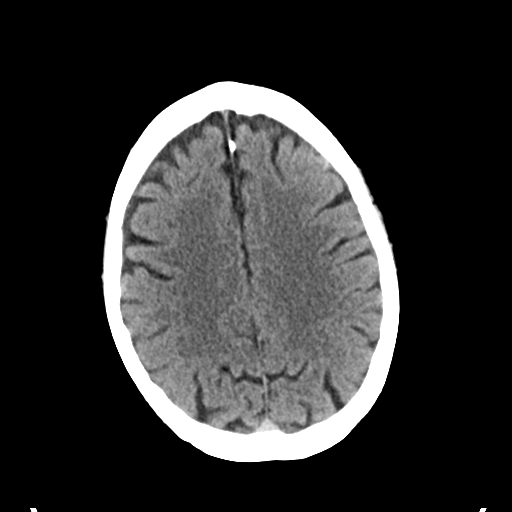
[im 22/31  brain]
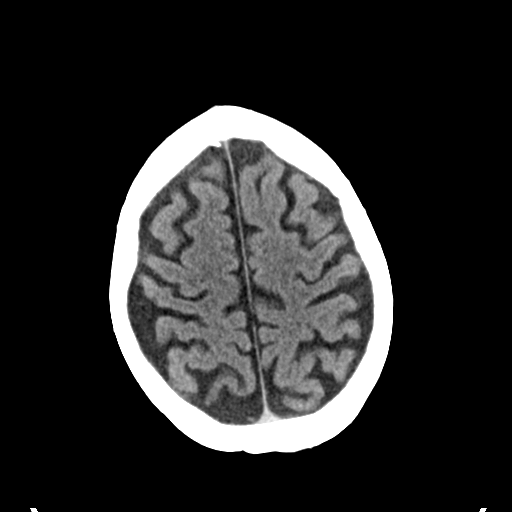
[im 25/31  brain]
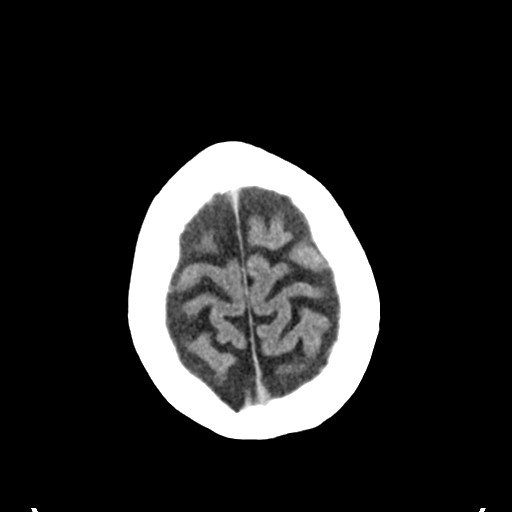
[im 28/31  brain]
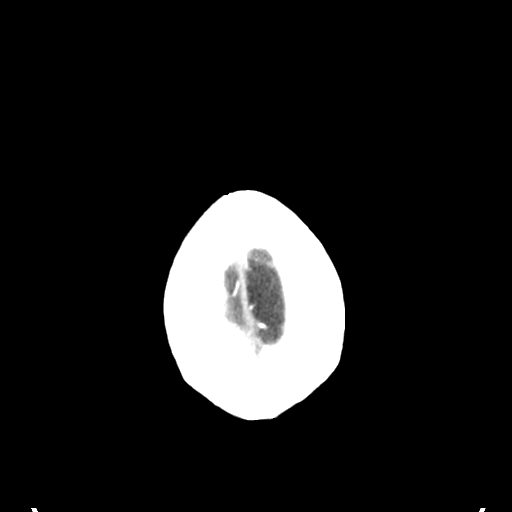
[im 28/31  bone]
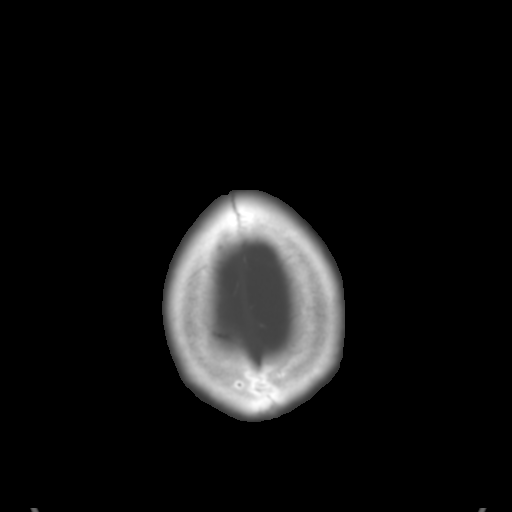

[Series 4: coronal soft tissue · coronal · 0.33mm/px · 3 of 71 slices shown]
[im 24/71  brain]
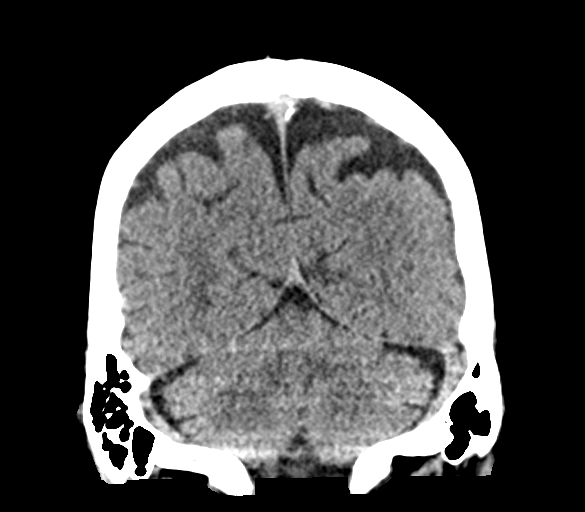
[im 32/71  brain]
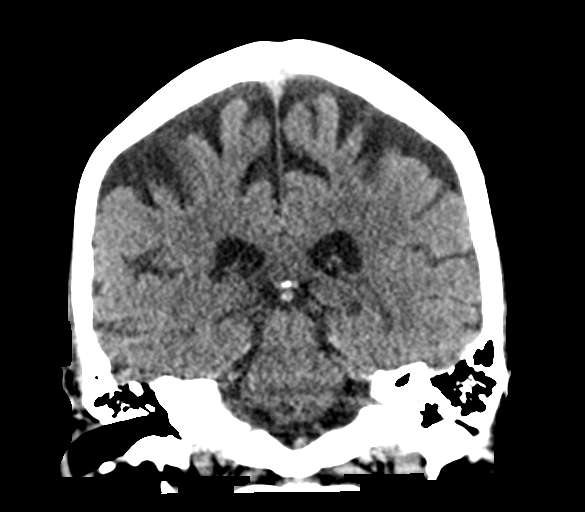
[im 39/71  brain]
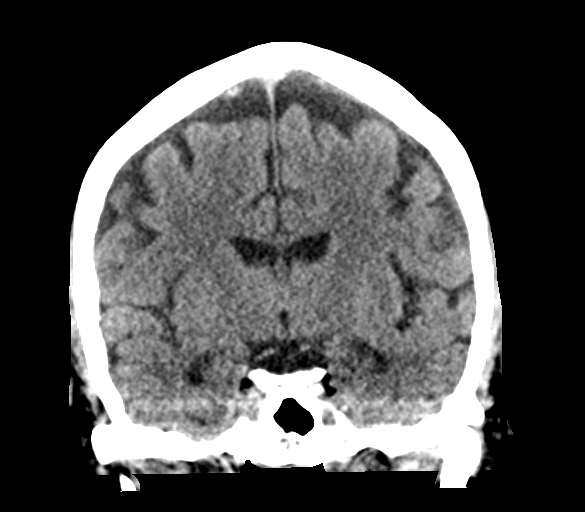

[Series 5: sagittal soft tissue · sagittal · 0.35mm/px · 3 of 61 slices shown]
[im 21/61  brain]
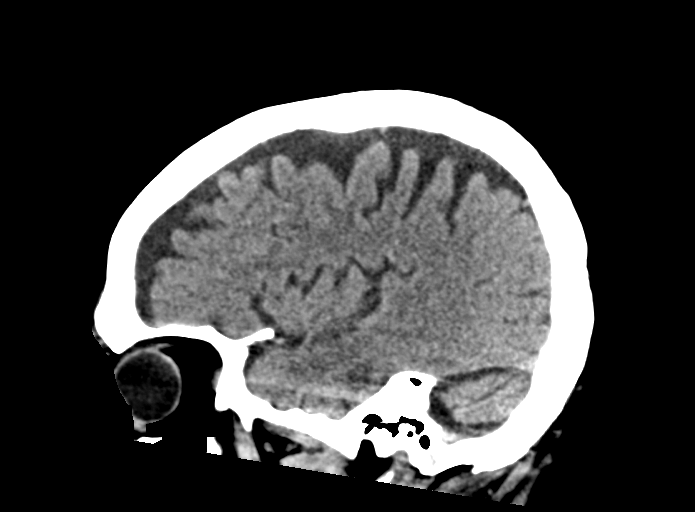
[im 31/61  brain]
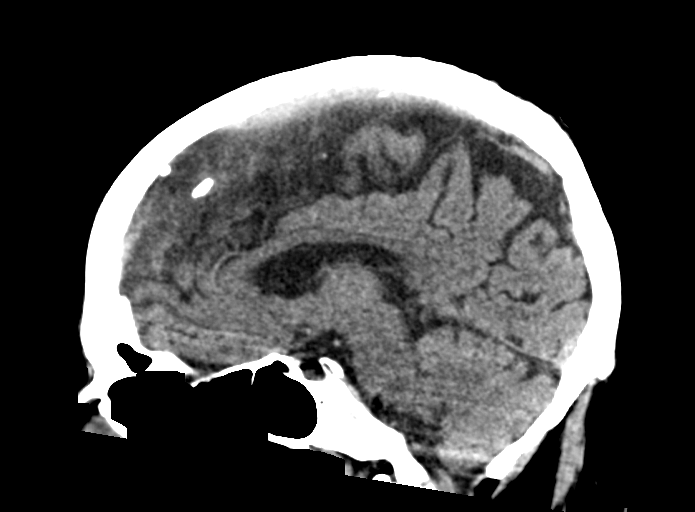
[im 41/61  brain]
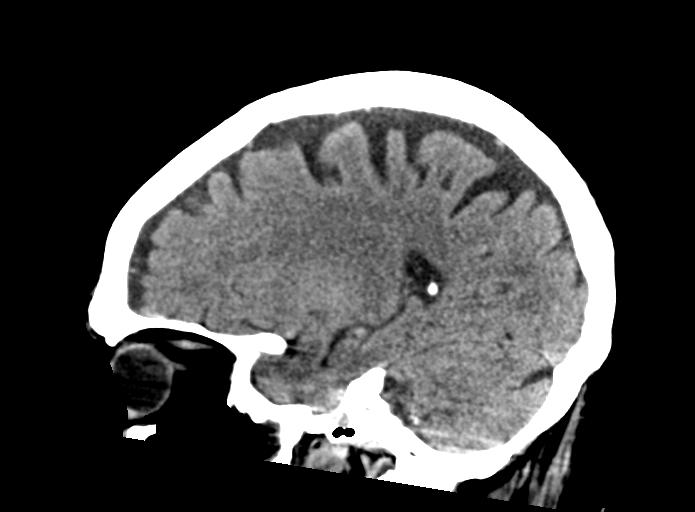

[15 of 47 positions shown; findings below may reference images not displayed]

FINDINGS: Brain: The ventricles are normal in size and configuration. There is
mild to moderate frontal and parietal lobe atrophy bilaterally.
There is no intracranial mass, hemorrhage, extra-axial fluid
collection, or midline shift. There is rather minimal small vessel
disease in the centra semiovale bilaterally. Elsewhere gray-white
compartments appear normal. No evident acute infarct.

Vascular: No hyperdense vessel. There is no appreciable vascular
calcification.

Skull: Bony calvarium appears intact.

Sinuses/Orbits: There is mucosal thickening in several ethmoid air
cells bilaterally. There is mild mucosal thickening in the posterior
right maxillary antrum. Other visualized paranasal sinuses are
clear. Question cataract removal on the right. Orbits otherwise
appear symmetric bilaterally.

Other: Mastoid air cells are clear.
IMPRESSION: Frontal and parietal lobe atrophy bilaterally. Minimal
periventricular small vessel disease. No acute infarct. No mass or
hemorrhage. Areas of paranasal sinus disease. Question cataract
removal on the right.

## 2019-12-28 DIAGNOSIS — I129 Hypertensive chronic kidney disease with stage 1 through stage 4 chronic kidney disease, or unspecified chronic kidney disease: Secondary | ICD-10-CM | POA: Diagnosis not present

## 2019-12-28 DIAGNOSIS — E039 Hypothyroidism, unspecified: Secondary | ICD-10-CM | POA: Diagnosis not present

## 2019-12-28 DIAGNOSIS — N1832 Chronic kidney disease, stage 3b: Secondary | ICD-10-CM | POA: Diagnosis not present

## 2020-01-04 DIAGNOSIS — Z961 Presence of intraocular lens: Secondary | ICD-10-CM | POA: Diagnosis not present

## 2020-01-04 DIAGNOSIS — H5213 Myopia, bilateral: Secondary | ICD-10-CM | POA: Diagnosis not present

## 2020-01-05 ENCOUNTER — Ambulatory Visit: Payer: Medicare Other | Attending: Internal Medicine

## 2020-01-05 DIAGNOSIS — Z23 Encounter for immunization: Secondary | ICD-10-CM | POA: Insufficient documentation

## 2020-01-05 NOTE — Progress Notes (Signed)
   Covid-19 Vaccination Clinic  Name:  Brandon Fleming    MRN: [id number] DOB: 1946/12/15  01/05/2020  [name] was observed post Covid-19 immunization for 15 minutes without incidence. He was provided with Vaccine Information Sheet and instruction to access the V-Safe system.   [name] was instructed to call 911 with any severe reactions post vaccine: [name] Kitchen Difficulty breathing  . Swelling of your face and throat  . A fast heartbeat  . A bad rash all over your body  . Dizziness and weakness    Immunizations Administered    Name Date Dose VIS Date Route   Pfizer COVID-19 Vaccine 01/05/2020  5:36 PM 0.3 mL 11/25/2019 Intramuscular   Manufacturer: [place]   Lot: [id number]   [place]: [id number]

## 2020-01-07 ENCOUNTER — Other Ambulatory Visit: Payer: Self-pay | Admitting: Internal Medicine

## 2020-01-07 DIAGNOSIS — N1832 Chronic kidney disease, stage 3b: Secondary | ICD-10-CM

## 2020-01-09 ENCOUNTER — Other Ambulatory Visit: Payer: Self-pay | Admitting: Neurology

## 2020-01-09 MED ORDER — MEMANTINE HCL ER 28 MG PO CP24: 28.0000 mg | ORAL_CAPSULE | Freq: Every day | ORAL | 3 refills | Status: DC

## 2020-01-09 NOTE — Telephone Encounter (Signed)
Pharmacy change made as requested.

## 2020-01-09 NOTE — Telephone Encounter (Addendum)
Brandon Fleming (Spouse) has called to report pt needs his  memantine (NAMENDA XR) 28 MG CP24 24 hr capsule Called into the [facility] [place] this is his new pharmacy

## 2020-01-09 NOTE — Addendum Note (Signed)
Addended by: Minna Antis on: 01/09/2020 04:52 PM   Modules accepted: Orders

## 2020-01-12 ENCOUNTER — Ambulatory Visit
Admission: RE | Admit: 2020-01-12 | Discharge: 2020-01-12 | Disposition: A | Discharge status: Home / Self Care | Payer: Medicare Other | Source: Ambulatory Visit | Attending: Internal Medicine | Admitting: Internal Medicine

## 2020-01-12 DIAGNOSIS — N1832 Chronic kidney disease, stage 3b: Secondary | ICD-10-CM | POA: Diagnosis not present

## 2020-01-26 ENCOUNTER — Ambulatory Visit: Payer: Medicare Other | Attending: Internal Medicine

## 2020-01-26 DIAGNOSIS — Z23 Encounter for immunization: Secondary | ICD-10-CM | POA: Insufficient documentation

## 2020-01-26 NOTE — Progress Notes (Signed)
   Covid-19 Vaccination Clinic  Name:  Brandon Fleming    MRN: [id number] DOB: December 22, 1945  01/26/2020  [name] was observed post Covid-19 immunization for 15 minutes without incidence. He was provided with Vaccine Information Sheet and instruction to access the V-Safe system.   [name] was instructed to call 911 with any severe reactions post vaccine: [name] Kitchen Difficulty breathing  . Swelling of your face and throat  . A fast heartbeat  . A bad rash all over your body  . Dizziness and weakness    Immunizations Administered    Name Date Dose VIS Date Route   Pfizer COVID-19 Vaccine 01/26/2020  8:44 AM 0.3 mL 11/25/2019 Intramuscular   Manufacturer: [place]   Lot: [id number]   [place]: [id number]

## 2020-02-01 ENCOUNTER — Other Ambulatory Visit: Payer: Self-pay | Admitting: Neurology

## 2020-02-20 DIAGNOSIS — I129 Hypertensive chronic kidney disease with stage 1 through stage 4 chronic kidney disease, or unspecified chronic kidney disease: Secondary | ICD-10-CM | POA: Diagnosis not present

## 2020-02-20 DIAGNOSIS — N400 Enlarged prostate without lower urinary tract symptoms: Secondary | ICD-10-CM | POA: Diagnosis not present

## 2020-02-20 DIAGNOSIS — E875 Hyperkalemia: Secondary | ICD-10-CM | POA: Diagnosis not present

## 2020-02-20 DIAGNOSIS — N1832 Chronic kidney disease, stage 3b: Secondary | ICD-10-CM | POA: Diagnosis not present

## 2020-02-28 DIAGNOSIS — E038 Other specified hypothyroidism: Secondary | ICD-10-CM | POA: Diagnosis not present

## 2020-04-18 NOTE — Progress Notes (Signed)
PATIENT: Brandon Fleming DOB: 03/23/1946  REASON FOR VISIT: follow up HISTORY FROM: patient  HISTORY OF PRESENT ILLNESS: Today 04/19/20  Brandon Fleming is a 74 year old male with history of alcohol overuse and mild memory disturbance and gait disturbance.  He was unable to tolerate Aricept secondary to diarrhea.  He remains on Namenda.  He is taking Remeron for sleep.  He does not eat much during the day, but has about 3-4 bourbon beverages a day, he eats a good dinner, likes ice cream before bed.  Memory is overall stable, has trouble with short-term, will ask his wife repetitive questions (what are we doing today?).  Long-term memory is good.  He has chronic back and leg pain, didn't have the planned injection because procedure was too painful.  The medications seem to agree with him, as long as he takes them.  His wife reminds him.  If he does not take the Remeron, he may be frustrated, up and down all night.  He has not had any falls, has had a few stumbles.  He drives a car without difficulty and does his own ADLs.  He uses a recumbent bike about 45 minutes a day in the morning, while watching the news.  He is back and forth to [place] on the weekends, where his daughter lives.  He presents today for evaluation accompanied by his wife.  HISTORY 10/20/2019 SS: Brandon Fleming is a 74 year old male with history of alcohol overuse and a mild memory disturbance and gait disturbance. He was unable to tolerate Aricept secondary to diarrhea.  He remains on Namenda.  He is taking Remeron which has been helpful for sleep.  His wife has a hard time getting him to take his medications.  He is actually taking Remeron in the morning, along with Prozac.  He indicates his sleeping has improved.  He performs all of his ADLs, and drives a car without difficulty.  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 He has chronic back pain, is scheduled for a lumbar injection tomorrow.  He has started using a  recumbent bike 30 minutes a day.  His wife says he only eats 1 meal a day, has about 3-4 bourbon beverages a day.  He has had 2 stumbles in the last several weeks, but no falls.  He feels his memory is stable, but has good and bad days.  They go back and forth to [place] where they have a home, and their grandkids are. He did not take his blood pressure medications today.  He presents today for follow-up accompanied by his wife.   REVIEW OF SYSTEMS: Out of a complete 14 system review of symptoms, the patient complains only of the following symptoms, and all other reviewed systems are negative.  Memory loss  ALLERGIES: Allergies  Allergen Reactions  . Atropine Swelling and Other (See Comments)    Eyes swollen and red  . Aricept [Donepezil Hcl]     Diarrhea, stomach problems.     HOME MEDICATIONS: Outpatient Medications Prior to Visit  Medication Sig Dispense Refill  . amLODipine (NORVASC) 5 MG tablet Take 5 mg by mouth daily.     [name] Kitchen aspirin EC 81 MG tablet Take 81 mg by mouth daily.    . cyclobenzaprine (FLEXERIL) 10 MG tablet Take 10 mg by mouth 3 (three) times daily as needed for muscle spasms.    . fenofibrate (TRICOR) 145 MG tablet Take 145 mg by mouth daily.    [name] Kitchen  FLUoxetine (PROZAC) 20 MG capsule Take 1 capsule (20 mg total) by mouth daily. 30 capsule 3  . folic acid (FOLVITE) 1 MG tablet Take 1 mg by mouth daily.    [name] Kitchen levothyroxine (SYNTHROID) 50 MCG tablet Take 50 mcg by mouth daily.    . memantine (NAMENDA XR) 28 MG CP24 24 hr capsule TAKE 1 CAPSULE (28 MG TOTAL) BY MOUTH DAILY. 90 capsule 1  . mirtazapine (REMERON) 30 MG tablet TAKE 1 TABLET BY MOUTH AT  BEDTIME 90 tablet 3  . pantoprazole (PROTONIX) 40 MG tablet Take 40 mg by mouth daily.    . rosuvastatin (CRESTOR) 10 MG tablet Take 10 mg by mouth daily.     [name] Kitchen thiamine 100 MG tablet Take 100 mg by mouth daily.    . traMADol (ULTRAM) 50 MG tablet Take by mouth every 6 (six) hours as needed.    [name] Kitchen esomeprazole (NEXIUM) 20  MG packet Take 20 mg by mouth daily before breakfast.    . lisinopril (PRINIVIL,ZESTRIL) 20 MG tablet Take 20 mg by mouth daily.     . Multiple Vitamins-Minerals (MULTIVITAMIN WITH MINERALS) tablet Take 1 tablet by mouth daily.     No facility-administered medications prior to visit.    PAST MEDICAL HISTORY: Past Medical History:  Diagnosis Date  . Alcohol abuse   . Gait abnormality 04/08/2018  . Hyperlipidemia   . Hypertension   . Kidney damage   . Memory difficulties 04/08/2018    PAST SURGICAL HISTORY: Past Surgical History:  Procedure Laterality Date  . BACK SURGERY    . COLONOSCOPY     no polyps  . COLONOSCOPY W/ POLYPECTOMY    . KNEE SURGERY Left 2011   fracture  . LASER PHOTO ABLATION Left 05/18/2018   Procedure: LASER PHOTO ABLATION;  Surgeon: Hayden Pedro, MD;  Location: [facility];  Service: Ophthalmology;  Laterality: Left;  . PARS PLANA VITRECTOMY Left 05/18/2018   Procedure: PARS PLANA VITRECTOMY WITH 25G REMOVAL/SUTURE INTRAOCULAR LENS;  Surgeon: Hayden Pedro, MD;  Location: [facility];  Service: Ophthalmology;  Laterality: Left;  [name] Kitchen VITRECTOMY Left 05/18/2018   PARS PLANA VITRECTOMY WITH 25G REMOVAL/SUTURE INTRAOCULAR LENS (Left)    FAMILY HISTORY: Family History  Problem Relation Age of Onset  . CAD Father   . Kidney disease Father   . Parkinson's disease Mother   . Anesthesia problems Neg Hx   . Broken bones Neg Hx   . Cancer Neg Hx   . Clotting disorder Neg Hx   . Collagen disease Neg Hx   . Diabetes Neg Hx   . Dislocations Neg Hx   . Osteoporosis Neg Hx   . Rheumatologic disease Neg Hx   . Scoliosis Neg Hx   . Severe sprains Neg Hx     SOCIAL HISTORY: Social History   Socioeconomic History  . Marital status: Married    Spouse name: Not on file  . Number of children: 1  . Years of education: 5  . Highest education level: Master's degree (e.g., MA, MS, MEng, MEd, MSW, MBA)  Occupational History  . Occupation: Retired  Tobacco Use  . Smoking  status: Former Smoker    Types: Cigarettes    Quit date: 1978    Years since quitting: 43.3  . Smokeless tobacco: Never Used  Substance and Sexual Activity  . Alcohol use: Yes    Comment: 8-10 ounces daily  . Drug use: No  . Sexual activity: Not on file  Other Topics Concern  .  Not on file  Social History Narrative   Lives at home with his wife.   Right-handed.   Caffeine- minimal use (4 ounces tea/soda per day)   Social Determinants of Health   Financial Resource Strain:   . Difficulty of Paying Living Expenses:   Food Insecurity:   . Worried About Charity fundraiser in the Last Year:   . Arboriculturist in the Last Year:   Transportation Needs:   . Film/video editor (Medical):   [name] Kitchen Lack of Transportation (Non-Medical):   Physical Activity:   . Days of Exercise per Week:   . Minutes of Exercise per Session:   Stress:   . Feeling of Stress :   Social Connections:   . Frequency of Communication with Friends and Family:   . Frequency of Social Gatherings with Friends and Family:   . Attends Religious Services:   . Active Member of Clubs or Organizations:   . Attends Archivist Meetings:   [name] Kitchen Marital Status:   Intimate Partner Violence:   . Fear of Current or Ex-Partner:   . Emotionally Abused:   [name] Kitchen Physically Abused:   . Sexually Abused:    PHYSICAL EXAM  Vitals:   04/19/20 1058  BP: 139/81  Pulse: 80  Temp: 97.7 F (36.5 C)  Weight: 165 lb (74.8 kg)  Height: 5\' 6"  (1.676 m)   Body mass index is 26.63 kg/m.  Generalized: Well developed, in no acute distress  MMSE - Mini Mental State Exam 04/19/2020 10/20/2019 04/08/2018  Orientation to time 5 4 3   Orientation to Place 5 5 4   Registration 3 3 3   Attention/ Calculation 5 5 5   Recall 3 2 1   Language- name 2 objects 2 2 2   Language- repeat 1 1 1   Language- follow 3 step command 2 3 3   Language- read & follow direction 1 1 1   Write a sentence 1 1 1   Copy design 1 1 1   Copy design-comments -  named 11 animals -  Total score 29 28 25     Neurological examination  Mentation: Alert oriented to time, place, history taking. Follows all commands speech and language fluent Cranial nerve II-XII: Pupils were equal round reactive to light. Extraocular movements were full, visual field were full on confrontational test. Facial sensation and strength were normal.  Head turning and shoulder shrug  were normal and symmetric. Motor: The motor testing reveals 5 over 5 strength of all 4 extremities. Good symmetric motor tone is noted throughout.  Sensory: Sensory testing is intact to soft touch on all 4 extremities. No evidence of extinction is noted.  Coordination: Cerebellar testing reveals good finger-nose-finger and heel-to-shin bilaterally.  Gait and station: Gait is slightly wide-based, otherwise steady, no assistive device Reflexes: Deep tendon reflexes are symmetric and normal bilaterally.   DIAGNOSTIC DATA (LABS, IMAGING, TESTING) - I reviewed patient records, labs, notes, testing and imaging myself where available.  Lab Results  Component Value Date   WBC 6.4 05/06/2018   HGB 9.8 (L) 05/06/2018   HCT 29.5 (L) 05/06/2018   MCV 96.7 05/06/2018   PLT 192 05/06/2018      Component Value Date/Time   NA 141 05/06/2018 0624   K 3.4 (L) 05/06/2018 0624   CL 107 05/06/2018 0624   CO2 25 05/06/2018 0624   GLUCOSE 122 (H) 05/06/2018 0624   BUN 17 05/06/2018 0624   CREATININE 1.18 05/06/2018 0624   CALCIUM 8.0 (L) 05/06/2018 [id number]  PROT 7.0 04/23/2018 1117   ALBUMIN 4.1 04/23/2018 1117   AST 47 (H) 04/23/2018 1117   ALT 26 04/23/2018 1117   ALKPHOS 48 04/23/2018 1117   BILITOT 0.6 04/23/2018 1117   GFRNONAA >60 05/06/2018 0624   GFRAA >60 05/06/2018 0624   No results found for: CHOL, HDL, LDLCALC, LDLDIRECT, TRIG, CHOLHDL No results found for: HGBA1C Lab Results  Component Value Date   VITAMINB12 335 02/26/2018   Lab Results  Component Value Date   TSH 4.465 02/26/2018       ASSESSMENT AND PLAN 74 y.o. year old male  has a past medical history of Alcohol abuse, Gait abnormality (04/08/2018), Hyperlipidemia, Hypertension, Kidney damage, and Memory difficulties (04/08/2018). here with:  1.  Mild memory disturbance 2.  Gait disturbance 3.  History of alcohol overuse 4.  Fatigue, insomnia  He seems to overall be doing well.  Memory score was 29/30.  He will remain on Remeron, Prozac, and Namenda XR.  Refills were sent.  His weight has remained stable.  I encouraged him to continue his exercise.  He will follow-up in 6 months or sooner if needed.  I spent 30 minutes of face-to-face and non-face-to-face time with patient.  This included previsit chart review, lab review, study review, order entry, electronic health record documentation, patient education.  Butler Denmark, AGNP-C, DNP 04/19/2020, 11:20 AM [facility] [address], [place], [place] [address] [phone]

## 2020-04-19 ENCOUNTER — Ambulatory Visit (INDEPENDENT_AMBULATORY_CARE_PROVIDER_SITE_OTHER): Payer: Medicare Other | Admitting: Neurology

## 2020-04-19 ENCOUNTER — Other Ambulatory Visit: Payer: Self-pay

## 2020-04-19 ENCOUNTER — Encounter: Payer: Self-pay | Admitting: Neurology

## 2020-04-19 VITALS — BP 139/81 | HR 80 | Temp 97.7°F | Ht 66.0 in | Wt 165.0 lb

## 2020-04-19 DIAGNOSIS — G4700 Insomnia, unspecified: Secondary | ICD-10-CM | POA: Diagnosis not present

## 2020-04-19 DIAGNOSIS — R269 Unspecified abnormalities of gait and mobility: Secondary | ICD-10-CM | POA: Diagnosis not present

## 2020-04-19 DIAGNOSIS — R413 Other amnesia: Secondary | ICD-10-CM

## 2020-04-19 MED ORDER — FLUOXETINE HCL 20 MG PO CAPS: 20.0000 mg | ORAL_CAPSULE | Freq: Every day | ORAL | 3 refills | Status: DC

## 2020-04-19 MED ORDER — MEMANTINE HCL ER 28 MG PO CP24: 28.0000 mg | ORAL_CAPSULE | Freq: Every day | ORAL | 3 refills | Status: DC

## 2020-04-19 MED ORDER — MIRTAZAPINE 30 MG PO TABS: 30.0000 mg | ORAL_TABLET | Freq: Every day | ORAL | 3 refills | Status: DC

## 2020-04-19 NOTE — Patient Instructions (Signed)
It was nice to see you today! Memory score was stable 29/30 Continue current medications  Continue with your exercise  See you back in 6 months

## 2020-04-20 NOTE — Progress Notes (Signed)
I have read the note, and I agree with the clinical assessment and plan.  Maurice Ramseur K Jere Bostrom   

## 2020-05-01 DIAGNOSIS — M4150 Other secondary scoliosis, site unspecified: Secondary | ICD-10-CM | POA: Diagnosis not present

## 2020-05-01 DIAGNOSIS — Z6825 Body mass index (BMI) 25.0-25.9, adult: Secondary | ICD-10-CM | POA: Diagnosis not present

## 2020-05-01 DIAGNOSIS — I10 Essential (primary) hypertension: Secondary | ICD-10-CM | POA: Diagnosis not present

## 2020-05-01 DIAGNOSIS — M5441 Lumbago with sciatica, right side: Secondary | ICD-10-CM | POA: Diagnosis not present

## 2020-05-07 ENCOUNTER — Telehealth: Payer: Self-pay | Admitting: Nurse Practitioner

## 2020-05-07 ENCOUNTER — Other Ambulatory Visit: Payer: Self-pay | Admitting: Neurosurgery

## 2020-05-07 DIAGNOSIS — M5441 Lumbago with sciatica, right side: Secondary | ICD-10-CM

## 2020-05-07 NOTE — Telephone Encounter (Signed)
Phone call to patient to verify medication list and allergies for myelogram procedure. Pt instructed to hold prozac, mirtazapine, and tramadol for 48hrs prior to myelogram appointment time. Pt verbalized understanding. Pre and post procedure instructions reviewed with pt.

## 2020-05-22 ENCOUNTER — Ambulatory Visit
Admission: RE | Admit: 2020-05-22 | Discharge: 2020-05-22 | Disposition: A | Discharge status: Home / Self Care | Payer: Medicare Other | Source: Ambulatory Visit | Attending: Neurosurgery | Admitting: Neurosurgery

## 2020-05-22 ENCOUNTER — Other Ambulatory Visit: Payer: Self-pay

## 2020-05-22 VITALS — BP 128/64 | HR 65

## 2020-05-22 DIAGNOSIS — M5441 Lumbago with sciatica, right side: Secondary | ICD-10-CM

## 2020-05-22 DIAGNOSIS — M4316 Spondylolisthesis, lumbar region: Secondary | ICD-10-CM

## 2020-05-22 MED ORDER — IOPAMIDOL (ISOVUE-M 200) INJECTION 41%
20.0000 mL | Freq: Once | INTRAMUSCULAR | Status: AC
  Administered 2020-05-22: 20 mL via INTRATHECAL

## 2020-05-22 MED ORDER — MEPERIDINE HCL 100 MG/ML IJ SOLN
50.0000 mg | Freq: Once | INTRAMUSCULAR | Status: AC
  Administered 2020-05-22: 50 mg via INTRAMUSCULAR

## 2020-05-22 MED ORDER — DIAZEPAM 5 MG PO TABS
5.0000 mg | ORAL_TABLET | Freq: Once | ORAL | Status: AC
  Administered 2020-05-22: 5 mg via ORAL

## 2020-05-22 MED ORDER — ONDANSETRON HCL 4 MG/2ML IJ SOLN
4.0000 mg | Freq: Once | INTRAMUSCULAR | Status: AC
  Administered 2020-05-22: 4 mg via INTRAMUSCULAR

## 2020-05-22 NOTE — Discharge Instructions (Signed)
Myelogram Discharge Instructions  1. Go home and rest quietly for the next 24 hours.  It is important to lie flat for the next 24 hours.  Get up only to go to the restroom.  You may lie in the bed or on a couch on your back, your stomach, your left side or your right side.  You may have one pillow under your head.  You may have pillows between your knees while you are on your side or under your knees while you are on your back.  2. DO NOT drive today.  Recline the seat as far back as it will go, while still wearing your seat belt, on the way home.  3. You may get up to go to the bathroom as needed.  You may sit up for 10 minutes to eat.  You may resume your normal diet and medications unless otherwise indicated.  Drink lots of extra fluids today and tomorrow.  4. The incidence of headache, nausea, or vomiting is about 5% (one in 20 patients).  If you develop a headache, lie flat and drink plenty of fluids until the headache goes away.  Caffeinated beverages may be helpful.  If you develop severe nausea and vomiting or a headache that does not go away with flat bed rest, call [phone].  5. You may resume normal activities after your 24 hours of bed rest is over; however, do not exert yourself strongly or do any heavy lifting tomorrow. If when you get up you have a headache when standing, go back to bed and force fluids for another 24 hours.  6. Call your physician for a follow-up appointment.  The results of your myelogram will be sent directly to your physician by the following day.  7. If you have any questions or if complications develop after you arrive home, please call [phone].  Discharge instructions have been explained to the patient.  The patient, or the person responsible for the patient, fully understands these instructions.  YOU MAY RESTART YOUR PROZAC AND TRAMADOL TOMORROW 05/23/20 AT 09:30AM.

## 2020-05-22 NOTE — Progress Notes (Signed)
Patient states he has been off Prozac and Tramadol for at least the past two days.

## 2020-06-13 ENCOUNTER — Emergency Department (EMERGENCY_DEPARTMENT_HOSPITAL): Payer: Medicare PPO

## 2020-06-13 ENCOUNTER — Emergency Department (HOSPITAL_COMMUNITY): Payer: Medicare PPO

## 2020-06-13 ENCOUNTER — Inpatient Hospital Stay (HOSPITAL_COMMUNITY): Payer: Self-pay | Admitting: Interventional Cardiology

## 2020-06-13 ENCOUNTER — Encounter (HOSPITAL_COMMUNITY)
Admission: EM | Disposition: A | Discharge status: Home / Self Care | Payer: Self-pay | Source: Skilled Nursing Facility | Attending: Cardiovascular Disease

## 2020-06-13 ENCOUNTER — Other Ambulatory Visit: Payer: Self-pay

## 2020-06-13 ENCOUNTER — Inpatient Hospital Stay
Admission: EM | Admit: 2020-06-13 | Discharge: 2020-06-16 | DRG: 244 | Disposition: A | Discharge status: Home / Self Care | Payer: Medicare PPO | Source: Skilled Nursing Facility | Attending: Cardiovascular Disease | Admitting: Cardiovascular Disease

## 2020-06-13 DIAGNOSIS — I4430 Unspecified atrioventricular block: Secondary | ICD-10-CM | POA: Diagnosis present

## 2020-06-13 DIAGNOSIS — E1140 Type 2 diabetes mellitus with diabetic neuropathy, unspecified: Secondary | ICD-10-CM | POA: Diagnosis present

## 2020-06-13 DIAGNOSIS — Z794 Long term (current) use of insulin: Secondary | ICD-10-CM

## 2020-06-13 DIAGNOSIS — I442 Atrioventricular block, complete: Principal | ICD-10-CM | POA: Diagnosis present

## 2020-06-13 DIAGNOSIS — I441 Atrioventricular block, second degree: Secondary | ICD-10-CM

## 2020-06-13 DIAGNOSIS — Z79899 Other long term (current) drug therapy: Secondary | ICD-10-CM

## 2020-06-13 DIAGNOSIS — Z20822 Contact with and (suspected) exposure to covid-19: Secondary | ICD-10-CM | POA: Diagnosis present

## 2020-06-13 DIAGNOSIS — Z6835 Body mass index (BMI) 35.0-35.9, adult: Secondary | ICD-10-CM

## 2020-06-13 DIAGNOSIS — R55 Syncope and collapse: Secondary | ICD-10-CM

## 2020-06-13 DIAGNOSIS — I444 Left anterior fascicular block: Secondary | ICD-10-CM

## 2020-06-13 DIAGNOSIS — I2510 Atherosclerotic heart disease of native coronary artery without angina pectoris: Secondary | ICD-10-CM | POA: Diagnosis present

## 2020-06-13 DIAGNOSIS — E785 Hyperlipidemia, unspecified: Secondary | ICD-10-CM | POA: Diagnosis present

## 2020-06-13 DIAGNOSIS — I10 Essential (primary) hypertension: Secondary | ICD-10-CM | POA: Diagnosis present

## 2020-06-13 DIAGNOSIS — Z6836 Body mass index (BMI) 36.0-36.9, adult: Secondary | ICD-10-CM

## 2020-06-13 DIAGNOSIS — Z7984 Long term (current) use of oral hypoglycemic drugs: Secondary | ICD-10-CM

## 2020-06-13 DIAGNOSIS — E119 Type 2 diabetes mellitus without complications: Secondary | ICD-10-CM | POA: Diagnosis present

## 2020-06-13 DIAGNOSIS — I452 Bifascicular block: Secondary | ICD-10-CM

## 2020-06-13 DIAGNOSIS — E669 Obesity, unspecified: Secondary | ICD-10-CM | POA: Diagnosis present

## 2020-06-13 LAB — CBC, DIFF
% Basophils: 0 %
% Eosinophils: 2 %
% Immature Granulocytes: 0 %
% Lymphocytes: 20 %
% Monocytes: 6 %
% Neutrophils: 72 %
% Nucleated RBC: 0 %
Absolute Eosinophil Count: 0.17 10*3/uL (ref 0.00–0.50)
Absolute Lymphocyte Count: 1.42 10*3/uL (ref 1.00–4.80)
Basophils: 0.03 10*3/uL (ref 0.00–0.20)
Hematocrit: 39 % (ref 38–50)
Hemoglobin: 13.9 g/dL (ref 13.0–18.0)
Immature Granulocytes: 0.03 10*3/uL (ref 0.00–0.05)
MCH: 31.7 pg (ref 27.3–33.6)
MCHC: 35.7 g/dL (ref 32.2–36.5)
MCV: 89 fL (ref 81–98)
Monocytes: 0.44 10*3/uL (ref 0.00–0.80)
Neutrophils: 5.21 10*3/uL (ref 1.80–7.00)
Nucleated RBC: 0 10*3/uL
Platelet Count: 144 10*3/uL — ABNORMAL LOW (ref 150–400)
RBC: 4.39 10*6/uL — ABNORMAL LOW (ref 4.40–5.60)
RDW-CV: 12.5 % (ref 11.6–14.4)
WBC: 7.3 10*3/uL (ref 4.3–10.0)

## 2020-06-13 LAB — COMPREHENSIVE METABOLIC PANEL
ALT (GPT): 19 U/L (ref 10–48)
AST (GOT): 21 U/L (ref 9–38)
Albumin: 3.8 g/dL (ref 3.5–5.2)
Alkaline Phosphatase (Total): 47 U/L (ref 36–161)
Anion Gap: 9 (ref 4–12)
Bilirubin (Total): 0.7 mg/dL (ref 0.2–1.3)
Calcium: 9.1 mg/dL (ref 8.9–10.2)
Carbon Dioxide, Total: 25 meq/L (ref 22–32)
Chloride: 103 meq/L (ref 98–108)
Creatinine: 0.78 mg/dL (ref 0.51–1.18)
Glucose: 122 mg/dL (ref 62–125)
Potassium: 3.9 meq/L (ref 3.6–5.2)
Protein (Total): 5.7 g/dL — ABNORMAL LOW (ref 6.0–8.2)
Sodium: 137 meq/L (ref 135–145)
Urea Nitrogen: 14 mg/dL (ref 8–21)
eGFR by CKD-EPI: >60 mL/min/{1.73_m2} (ref >59)

## 2020-06-13 LAB — EKG 12 LEAD
Atrial Rate: 40 {beats}/min
P Axis: 53 degrees
P-R Interval: 308 ms
Q-T Interval: 520 ms
QRS Duration: 140 ms
QTC Calculation: 423 ms
R Axis: -72 degrees
T Axis: 11 degrees
Ventricular Rate: 40 {beats}/min

## 2020-06-13 LAB — SARS-COV-2 (COVID-19) QUALITATIVE RAPID PCR: COVID-19 Coronavirus Qual PCR Result: NOT DETECTED

## 2020-06-13 LAB — 1ST EXTRA GOLD TOP

## 2020-06-13 LAB — MAGNESIUM: Magnesium: 1.5 mg/dL — ABNORMAL LOW (ref 1.8–2.4)

## 2020-06-13 LAB — B_TYPE NATRIURETIC PEPTIDE: B_Type Natriuretic Peptide: 342 pg/mL — ABNORMAL HIGH (ref <101)

## 2020-06-13 LAB — TROPONIN_I
Troponin_I Interpretation: NORMAL
Troponin_I Interpretation: NORMAL
Troponin_I: <0.03 ng/mL (ref <0.04)
Troponin_I: <0.03 ng/mL (ref <0.04)

## 2020-06-13 LAB — GLUCOSE, FINGERSTICK POC
Glucose, Finger Stick POC: 137 mg/dL — ABNORMAL HIGH (ref 62–125)
Glucose, Finger Stick POC: 119 mg/dL (ref 62–125)

## 2020-06-13 LAB — 1ST EXTRA BLUE TOP

## 2020-06-13 SURGERY — CORONARY ANGIOGRAM
Anesthesia: Moderate Sedation

## 2020-06-13 MED ORDER — ATORVASTATIN CALCIUM 10 MG OR TABS
10.0000 mg | ORAL_TABLET | Freq: Every day | ORAL | Status: DC
Start: 2020-06-13 — End: 2020-06-16
  Administered 2020-06-14 – 2020-06-16 (×3): 10 mg via ORAL
  Filled 2020-06-13 (×3): qty 1

## 2020-06-13 MED ORDER — LOSARTAN POTASSIUM 50 MG OR TABS
50.0000 mg | ORAL_TABLET | Freq: Every day | ORAL | Status: DC
Start: 2020-06-13 — End: 2020-06-15
  Administered 2020-06-14 – 2020-06-15 (×2): 50 mg via ORAL
  Filled 2020-06-13 (×2): qty 1

## 2020-06-13 MED ORDER — OXYCODONE HCL 5 MG OR TABS
5.0000 mg | ORAL_TABLET | ORAL | Status: DC | PRN
Start: 2020-06-13 — End: 2020-06-16

## 2020-06-13 MED ORDER — LIDOCAINE HCL 2 % IJ SOLN
INTRAMUSCULAR | Status: DC | PRN
Start: 2020-06-13 — End: 2020-06-13
  Administered 2020-06-13: 6 mL via SUBCUTANEOUS

## 2020-06-13 MED ORDER — SODIUM CHLORIDE 0.9% IV BOLUS
250.0000 mL | Freq: Once | INTRAVENOUS | Status: DC | PRN
Start: 2020-06-13 — End: 2020-06-15

## 2020-06-13 MED ORDER — METFORMIN HCL 1000 MG OR TABS
1000.0000 mg | ORAL_TABLET | Freq: Two times a day (BID) | ORAL | Status: DC
Start: 2020-06-13 — End: 2020-06-14
  Administered 2020-06-13 – 2020-06-14 (×2): 1000 mg via ORAL
  Filled 2020-06-13 (×2): qty 1

## 2020-06-13 MED ORDER — HYDROCHLOROTHIAZIDE 25 MG OR TABS
25.0000 mg | ORAL_TABLET | Freq: Every day | ORAL | Status: DC
Start: 2020-06-13 — End: 2020-06-16
  Administered 2020-06-14 – 2020-06-16 (×3): 25 mg via ORAL
  Filled 2020-06-13 (×3): qty 1

## 2020-06-13 MED ORDER — LIDOCAINE HCL (PF) 2 % IJ SOLN
INTRAMUSCULAR | Status: AC
Start: 2020-06-13 — End: ?
  Filled 2020-06-13: qty 10

## 2020-06-13 MED ORDER — PRAZOSIN HCL 1 MG OR CAPS
1.0000 mg | ORAL_CAPSULE | Freq: Two times a day (BID) | ORAL | Status: DC
Start: 2020-06-13 — End: 2020-06-16
  Administered 2020-06-13 – 2020-06-16 (×6): 1 mg via ORAL
  Filled 2020-06-13 (×8): qty 1

## 2020-06-13 MED ORDER — ACETAMINOPHEN 325 MG OR TABS
325.0000 mg | ORAL_TABLET | ORAL | Status: DC | PRN
Start: 2020-06-13 — End: 2020-06-16

## 2020-06-13 MED ORDER — FENTANYL CITRATE (PF) 100 MCG/2ML IJ SOLN
INTRAMUSCULAR | Status: AC
Start: 2020-06-13 — End: ?
  Filled 2020-06-13: qty 2

## 2020-06-13 MED ORDER — SODIUM CHLORIDE (PF) 0.9 % IJ SOLN
1.0000 mL | Freq: Once | INTRAVENOUS | Status: DC | PRN
Start: 2020-06-13 — End: 2020-06-15

## 2020-06-13 MED ORDER — SODIUM CHLORIDE 0.9 % IV SOLN
3.0000 mL/h | INTRAVENOUS | Status: DC
Start: 2020-06-13 — End: 2020-06-15
  Administered 2020-06-13: 3 mL/h via INTRAVENOUS

## 2020-06-13 MED ORDER — ATROPINE SULFATE 1 MG/10ML IJ SOSY
0.5000 mg | PREFILLED_SYRINGE | INTRAMUSCULAR | Status: DC | PRN
Start: 2020-06-13 — End: 2020-06-16

## 2020-06-13 SURGICAL SUPPLY — 3 items
CATHETER SWAN GANZ BIPOLAR PACING SVC INSERT 5F TEST (Catheter) ×2 IMPLANT
INTRODUCER MICROPUNCTURE 5FR 21GA .018IN TRANSITIONLESS-TIP NITINOL PLAT (Sheath) ×2 IMPLANT
SHEATH INTRODUCER PINNACLE  5F X 10CM .038 W/GUIDEWIRE (Sheath) ×2 IMPLANT

## 2020-06-13 NOTE — H&P (Addendum)
History and Physical     [name]") - DOB: 11-17-1946 (74 year old male)  PCP: Margretta Ditty, MD   Code Status: No Order     CHIEF CONCERN / IDENTIFICATION:  Stephen Eaton is a 75 year old male with AV block     SUBJECTIVE   HISTORY OF PRESENT ILLNESS:   73 yrs old male with medical history as listed below; presented to the ER following a syncopal episode, found to be in high degree AV block. Underwent TVP placement via the R IJ. Clinically and hemodynamically stable post procedure    Review of Systems   Constitutional: Negative.    HENT: Negative.    Eyes: Negative.    Respiratory: Negative.    Cardiovascular: Negative.    Gastrointestinal: Negative.    Endocrine: Negative.    Genitourinary: Negative.    Musculoskeletal: Negative.    Skin: Negative.    Neurological: Negative.    Hematological: Negative.        HISTORY   No past medical history on file.    No past surgical history on file.    Social History     Tobacco Use   . Smoking status: Not on file   Substance and Sexual Activity   . Alcohol use: Not on file   . Drug use: Not on file   . Sexual activity: Not on file   Social History Narrative   . Not on file       No family history on file.       OUTPATIENT MEDICATIONS:   Current Outpatient Medications   Medication Instructions   . atorvastatin (LIPITOR) 10 mg, Oral, Daily   . fluticasone propionate 50 MCG/ACT nasal spray 2 sprays, Both Nostrils, Daily PRN   . hydroCHLOROthiazide (HYDRODIURIL) 25 mg, Oral, Daily   . losartan (COZAAR) 50 mg, Oral, Daily   . metFORMIN (GLUCOPHAGE) 1,000 mg, Oral, 2 times daily with meals   . metoprolol succinate ER (TOPROL XL) 50 mg, Oral, Daily   . naproxen (ALEVE) 220 mg, Oral, Every 12 hours PRN   . prazosin (MINIPRESS) 1 mg, Oral, 2 times daily       ALLERGIES:   Patient has no known allergies.      OBJECTIVE     Vitals (Arrival)      T: 36.9 C (06/13/20 1538)  BP: (!) 171/72 (06/13/20 1536)  HR: (!) 39 (06/13/20 1536)  RR: 18  (06/13/20 1536)  SpO2: 99 % (06/13/20 1536)   Vitals (Most recent in last 24 hrs)   T: 36.9 C (06/13/20 1538)  BP: (!) 171/72 (06/13/20 1536)  HR: (!) 39 (06/13/20 1536)  RR: 18 (06/13/20 1536)  SpO2: 99 % (06/13/20 1536)  T range: Temp  Min: 36.9 C  Max: 36.9 C  Wt 255 lb 15.3 oz (116.1 kg)     Ht 5' 8"  (1.727 m)     Body mass index is 38.92 kg/m.       Physical Exam  Constitutional:       Appearance: Normal appearance. He is obese.   HENT:      Head: Normocephalic and atraumatic.   Neck:      Musculoskeletal: Normal range of motion.   Cardiovascular:      Rate and Rhythm: Regular rhythm. Bradycardia present.      Pulses: Normal pulses.   Pulmonary:      Effort: Pulmonary effort is normal.      Breath sounds: Normal  breath sounds.   Abdominal:      General: Bowel sounds are normal.      Palpations: Abdomen is soft.   Skin:     General: Skin is warm.   Neurological:      General: No focal deficit present.      Mental Status: He is alert.   Psychiatric:         Mood and Affect: Mood normal.         Labs (last 24 hours):   Chemistries  CBC  LFT  Gases, other   137 103 14 122   13.9   AST: 21 ALT: 19  -/-/-/-  -/-/-/-   3.9 25 0.78   7.30 >< 144  AP: 18 T bili: 0.7  Lact (a): - Lact (v): -   eGFR: >60 Ca: 9.1   39   Prot: 5.7 Alb: 3.8  Trop I: <0.03 D-dimer: -   Mg: 1.5 PO4: -  ANC: 5.21     BNP: 342 Anti-Xa: -     ALC: 1.42    INR: -        MICRO:   I have personally reviewed the latest Micro results and cultures     IMAGING:   I have reviewed the latest radiology results     PATHOLOGY:   I have reviewed the latest pathology results          ASSESSMENT/PLAN       74 yrs old male with syncope and found to have high degree AV block    1. AV block; 2:1, s/p TVP via R IJ.    Admit to CCU   DC BB   Tele, trops, EKG   Echocardiogram   CMP, CBC and TSH check   Eval for PPM in AM if no reversible cause of bradyarrhythmias  2. DM; resume home meds  3. HTN; resume home meds, DC BB  4. HLD; continue with  statins  5. Obesity    Inpatient Checklist:    Fluids/electrolytes: KVO  Diet: Cardiac/ diabetic  Prophylaxis: SCD and PPIs  Lines/Drains/Airways: Peripheral and 34F R IJ sheath  Disposition: ICU   Code Status: Full code  Contacts: Primary Emergency Contact: Wheatley,DANIEL    --  Meda Klinefelter, MD, MPH, [facility]  Clinical Assistant Professor of [employer] [facility], [place].  [phone]  [place] [address]  [phone]  C:([phone]  [phone]  E: [email or web address]

## 2020-06-13 NOTE — Nursing Note (Signed)
Pt got admitted after syncopal episode s/p temporary pacemaker- VVI rate 70/min, 68mA and sensitivity 1.5. Capturing and sensing. Denies pain or SOB. Vitals stable. CMS intact. To be NPO after midnight for permanent pacemaker tomorrow.

## 2020-06-13 NOTE — Procedure Nursing Note (Signed)
Patient brought to cath lab for placement of temporary pacing wires for complete heart block.  Patient has not been NPO so procedure to be performed with local only.  He is A&Ox3 with no complaints, vitals WNLs other than HR 30s.

## 2020-06-13 NOTE — ED Provider Notes (Signed)
CHIEF COMPLAINT   Chief Complaint   Patient presents with   . Syncope   . Bradycardia            HISTORY OF PRESENT ILLNESS   HPI   74 year old man presents after syncopal episode.  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  The symptoms resolved spontaneously.  Patient slept well overnight.  States that this morning he was walking around with no ill effects.  States that shortly before presentation, he walked into his backyard, had sudden blurring of his vision and awoke on the ground.  Found to be bradycardic in the 30s by paramedics.  Patient with no pain complaints, no dizziness at the time of evaluation while lying supine.                PAST MEDICAL AND SURGICAL HISTORY   Hypertension  Hyperlipidemia  GERD         SOCIAL HISTORY    Denies tobacco or drug use     PAST FAMILY HISTORY   Reviewed, noncontributory to current encounter       ALLERGIES   Review of patient's allergies indicates:  No Known Allergies       REVIEW OF SYSTEMS   Review of Systems   Constitutional: Negative for fever and chills.   HENT: Negative for trouble swallowing and voice change.    Eyes: Negative for pain and redness.   Respiratory: Negative for shortness of breath and cough.    Cardiovascular: Negative for chest pain and palpitations.   Gastrointestinal: Positive for nausea. Negative for vomiting and diarrhea.   Genitourinary: Negative for dysuria and hematuria.   Musculoskeletal: Negative for myalgias.   Skin: Negative for rash and color change.   Neurological: Positive for syncope. Negative for headaches and dizziness.   Psychiatric/Behavioral: Negative for suicidal ideas.              PHYSICAL EXAM   ED VITALS:     Vitals (Arrival)      T: 36.9 C (06/13/20 1538)  BP: (!) 171/72 (06/13/20 1536)  HR: (!) 39 (06/13/20 1536)  RR: 18 (06/13/20 1536)  SpO2: 99 % (06/13/20 1536)   Vitals (Most recent in last 24 hrs)   T: 36.9 C  (06/13/20 1538)  BP: (!) 171/72 (06/13/20 1536)  HR: (!) 39 (06/13/20 1536)  RR: 18 (06/13/20 1536)  SpO2: 99 % (06/13/20 1536)  T range: Temp  Min: 36.9 C  Max: 36.9 C  Wt 255 lb 15.3 oz (116.1 kg)     (no height taken for this visit)     There is no height or weight on file to calculate BMI.       Physical Exam  Constitutional:       General: He is not in acute distress.  HENT:      Head: Normocephalic and atraumatic.      Nose: Nose normal.      Mouth/Throat:      Mouth: Mucous membranes are moist.   Eyes:      Conjunctiva/sclera: Conjunctivae normal.      Pupils: Pupils are equal, round, and reactive to light.   Neck:      Musculoskeletal: Normal range of motion.   Cardiovascular:      Rate and Rhythm: Regular rhythm. Bradycardia present.      Pulses: Normal pulses.   Pulmonary:      Effort: Pulmonary effort is  normal. No respiratory distress.   Abdominal:      General: There is no distension.      Palpations: Abdomen is soft.   Musculoskeletal:         General: No swelling or deformity.   Skin:     General: Skin is warm and dry.   Neurological:      General: No focal deficit present.      Mental Status: He is alert.      Motor: No weakness.   Psychiatric:         Mood and Affect: Mood normal.         Behavior: Behavior normal.           LABORATORY:   Labs Reviewed   GLUCOSE, FINGERSTICK POC - Abnormal       Result Value    Glucose, Finger Stick POC 137 (*)    SARS-COV-2 (COVID-19) QUALITATIVE RAPID PCR    COVID-19 Coronavirus Qual PCR Specimen Type Nasopharyngeal Swab      COVID-19 Coronavirus Qual PCR Result        COVID-19 Coronavirus Qual PCR Interpretation        COVID-19 Qualitative PCR Indication Admission Surveillance     CBC, DIFF   COMPREHENSIVE METABOLIC PANEL   TROPONIN_I   MAGNESIUM   B_TYPE NATRIURETIC PEPTIDE         IMAGING:     ED Wet Read -   XR Chest 1 View    (Results Pending)   CT Head wo Contrast    (Results Pending)   Cardiac catheterization    (Results Pending)       Radiology Final  Result -   No image results found.              4TH  YEAR RESIDENT              ED COURSE/MEDICAL DECISION MAKING     Clinical Impressions as of Jun 14 1623   Complete heart block [facility])       35-year-old man presents for evaluation of syncopal episode after episode of nausea and diaphoresis yesterday.  Patient presents afebrile, significantly bradycardic but otherwise hemodynamically stable, no acute distress.  Physical exam revealing only of bradycardic but regular pulse.  No other acutely concerning findings.    EKG shows complete heart block with escape rhythm.  No other signs of acute ischemia or strain.    Labs ordered, pending at time of patient disposition.    Interventional cardiologist contacted, assessed the patient in the emergency department and agree with the plan for pacemaker placement in the Cath Lab.  Patient agreeable to this plan.  Patient admitted continually bradycardic but awake and otherwise stable.             DISPOSITION & IMPRESSION   Disposition: Admit                 CRITICAL CARE - ATTENDING ONLY   Critical Care - Yes - I provided critical care for this patient. Critical care was necessary because there was a high probability of imminent or life-threatening deterioration and this patient required continuous observation and interventions to respond to emergent changes in their medical condition and prevent further deterioration in their clinical state. The critical care I provided included:  consultation with specialists, discussion with patient and family, patient chart, close cardiac monitoring, placement of defibrillation pads with readiness for external pacing.    Critical Care Time:    I spent  a total of (30-74 minutes) 35 minutes personally providing critical care to [facility]. This does not include time spent teaching or performing any separately billable procedures.              ADDITIONAL INFORMATION REVIEWED  - ATTENDING ONLY           Melanee Spry,  MD  06/13/20 1630

## 2020-06-13 NOTE — ED Triage Notes (Signed)
Pt A&ox4, had syncopal episode. Hx HTN. Brady in field in 30s. Had vision changes on position change. SBP WNL. Block present on EKG

## 2020-06-14 ENCOUNTER — Inpatient Hospital Stay (HOSPITAL_BASED_OUTPATIENT_CLINIC_OR_DEPARTMENT_OTHER): Payer: Medicare PPO

## 2020-06-14 DIAGNOSIS — I455 Other specified heart block: Secondary | ICD-10-CM

## 2020-06-14 LAB — CBC (HEMOGRAM)
Hematocrit: 42 % (ref 38–50)
Hemoglobin: 14.3 g/dL (ref 13.0–18.0)
MCH: 31.4 pg (ref 27.3–33.6)
MCHC: 34 g/dL (ref 32.2–36.5)
MCV: 92 fL (ref 81–98)
Platelet Count: 139 10*3/uL — ABNORMAL LOW (ref 150–400)
RBC: 4.56 10*6/uL (ref 4.40–5.60)
RDW-CV: 12.4 % (ref 11.6–14.4)
WBC: 7.25 10*3/uL (ref 4.3–10.0)

## 2020-06-14 LAB — COMPREHENSIVE METABOLIC PANEL
ALT (GPT): 15 U/L (ref 10–48)
AST (GOT): 19 U/L (ref 9–38)
Albumin: 3.7 g/dL (ref 3.5–5.2)
Alkaline Phosphatase (Total): 47 U/L (ref 36–161)
Anion Gap: 6 (ref 4–12)
Bilirubin (Total): 0.9 mg/dL (ref 0.2–1.3)
Calcium: 9.3 mg/dL (ref 8.9–10.2)
Carbon Dioxide, Total: 29 meq/L (ref 22–32)
Chloride: 104 meq/L (ref 98–108)
Creatinine: 0.79 mg/dL (ref 0.51–1.18)
Glucose: 121 mg/dL (ref 62–125)
Potassium: 3.9 meq/L (ref 3.6–5.2)
Protein (Total): 5.6 g/dL — ABNORMAL LOW (ref 6.0–8.2)
Sodium: 139 meq/L (ref 135–145)
Urea Nitrogen: 14 mg/dL (ref 8–21)
eGFR by CKD-EPI: >60 mL/min/{1.73_m2} (ref >59)

## 2020-06-14 LAB — TRANSTHORACIC ECHO (TTE) COMPLETE
AoV max: 122 cm/s
Ascending aorta: 3.5 cm
E/E' ratio: 9.6
IVSd: 1.2 cm
LV Systolic Volume (BP): 33.2 ml
LVIDd: 3.8 cm
LVIDs: 2.6 cm
LVPWd: 1.4 cm
RVDd: 4.2 cm
RVDd: 4.2 cm

## 2020-06-14 LAB — GLUCOSE, FINGERSTICK POC: Glucose, Finger Stick POC: 105 mg/dL (ref 62–125)

## 2020-06-14 LAB — TROPONIN_I
Troponin_I Interpretation: NORMAL
Troponin_I Interpretation: NORMAL
Troponin_I Interpretation: NORMAL
Troponin_I: <0.03 ng/mL (ref <0.04)
Troponin_I: <0.03 ng/mL (ref <0.04)
Troponin_I: <0.03 ng/mL (ref <0.04)

## 2020-06-14 LAB — TSH WITH REFLEXIVE FREE T4: TSH with Reflexive Free T4: 0.935 u[IU]/mL (ref 0.400–5.000)

## 2020-06-14 MED ORDER — INSULIN LISPRO (1 UNIT DIAL) 100 UNIT/ML SC SOPN
0.0000 [IU] | PEN_INJECTOR | Freq: Three times a day (TID) | SUBCUTANEOUS | Status: DC
Start: 2020-06-14 — End: 2020-06-16
  Administered 2020-06-15: 2 [IU] via SUBCUTANEOUS

## 2020-06-14 MED ORDER — INSULIN LISPRO (1 UNIT DIAL) 100 UNIT/ML PEN FOR REFILL
PEN_INJECTOR | SUBCUTANEOUS | Status: DC | PRN
Start: 2020-06-14 — End: 2020-06-16
  Filled 2020-06-14: qty 1

## 2020-06-14 MED ORDER — GLUCAGON HCL RDNA (DIAGNOSTIC) 1 MG IJ SOLR
0.5000 mg | INTRAMUSCULAR | Status: DC | PRN
Start: 2020-06-14 — End: 2020-06-16

## 2020-06-14 MED ORDER — GLUCAGON HCL RDNA (DIAGNOSTIC) 1 MG IJ SOLR
1.0000 mg | INTRAMUSCULAR | Status: DC | PRN
Start: 2020-06-14 — End: 2020-06-16

## 2020-06-14 MED ORDER — DEXTROSE 50 % IV SOLN
50.0000 mL | INTRAVENOUS | Status: DC | PRN
Start: 2020-06-14 — End: 2020-06-16

## 2020-06-14 MED ORDER — DEXTROSE 50 % IV SOLN
25.0000 mL | INTRAVENOUS | Status: DC | PRN
Start: 2020-06-14 — End: 2020-06-16

## 2020-06-14 MED ORDER — INSULIN LISPRO (1 UNIT DIAL) 100 UNIT/ML SC SOPN
0.0000 [IU] | PEN_INJECTOR | Freq: Every evening | SUBCUTANEOUS | Status: DC
Start: 2020-06-14 — End: 2020-06-16

## 2020-06-14 NOTE — Nursing Note (Signed)
TV place evening of 6/30.  This morning back up rate turned down to 50s with goal of removing this afternoon.  Was SR in 60s for a couple hours but then became dependent on the pacer. Seen by cardiology, rate increased to 60.  Had poor capture.  Milliamps gradually increased, now at 14 with good capture.  Sensing remains 1.5.  Plan for permanent pacemaker at 0900 tomorrow. Wife visited.   Edited by: Bing Quarry, RN at 06/14/2020 1811    Illness Severity  Severe  Edited by: Dallie Dad, RN at 06/14/2020 [phone]

## 2020-06-14 NOTE — Progress Notes (Signed)
CARDIOLOGY PROGRESS NOTE    Stephen Eaton")- DOB: 15-Dec-1946 (74 year old male)  Admit Date: 06/13/2020 - Length of Stay: 1  Code Status: Full Code    ID: 73yo man with type 2 diabetes, HTN, obesity, and bilateral lower extremity lymphedema presents with syncope and bradycardia found to have CHB for which TVP was placed 6/30.      SUBJECTIVE     INTERVAL EVENTS:  --no acute overnight events  --back up rate decreased from 70 to 50bpm this AM as pt had own intrinsic rhythm of 70s and then later developed pacer dependence with underlying CHB. Failure to capture noted on telemetry this afternoon, mA adjusted from 7=>11.   --spouse at bedside    ROS:  Complete review of systems has been obtained and is negative except as above.    SCHEDULED MEDICATIONS:   atorvastatin, 10 mg, Daily  hydroCHLOROthiazide, 25 mg, Daily  losartan, 50 mg, Daily  metFORMIN, 1,000 mg, BID with meals  prazosin, 1 mg, BID    INFUSED MEDICATIONS:  sodium chloride, 3 mL/hr, Last Rate: 3 mL/hr (06/14/20 0700)    PRN MEDICATIONS:  PRN medications: acetaminophen, atropine, oxyCODONE, perflutren lipid microsphere (Definity) in NS injection, sodium chloride          OBJECTIVE   BP (!) 153/52 (Patient Position: Lying)   Pulse 53   Temp 36.7 C (Temporal)   Resp 22   Ht 5' 8"  (1.727 m)   Wt (!) 105.4 kg (232 lb 5.8 oz)   SpO2 98%   BMI 35.33 kg/m   Weight for the past 168 hrs:   Weight   06/14/20 0630 (!) 105.4 kg (232 lb 5.8 oz)   06/13/20 1800 (!) 112.6 kg (248 lb 3.8 oz)   06/13/20 1538 (!) 116.1 kg (255 lb 15.3 oz)       Intake/Output Summary (Last 24 hours) at 06/14/2020 1417  Last data filed at 06/14/2020 1200  Intake 437.2 ml   Output 3080 ml   Net -2642.8 ml     Physical Exam:  CONSTITUTIONAL: elderly male in NAD  HEENT: Normocephalic/atraumatic.  Conjunctivae pink, sclerae clear. Oral mucosa pink, MMM.   NECK:  Jugular venous pressure normal. RIJ introducer with TVP secured in place.   RESPIRATORY:  Respiratory effort is normal  on room air.  Lungs are clear to auscultation bilaterally.  No wheezes, rales, or rhonchi.  CARDIOVASCULAR:  Regular rhythm.  S1, S2 normal.  No murmur, rub or gallop.  Pedal pulses normal bilaterally.    GASTROINTESTINAL: Normoactive bowel tones.  Soft, non-tender, non-distended.   EXTREMITIES:  No clubbing or cyanosis, bilateral chronic LE lymphedema L>R.   MUSCULOSKELETAL:  No injury or gross deformities.   NEURO:  Awake, alert, oriented x 3.  Grossly non-focal.  PSYCH:  Calm, cooperative.  SKIN:  Warm and dry, no lesions.    DIAGNOSTIC DATA:   Recent Labs     06/13/20  1637 06/14/20  0513   WBC 7.30 7.25   HEMOGLOBIN 13.9 14.3   HEMATOCRIT 39 42   PLATELET 144* 139*     Recent Labs     06/13/20  1637 06/14/20  0513   SODIUM 137 139   POTASSIUM 3.9 3.9   CL 103 104   CO2 25 29   BUN 14 14   CREATININE 0.78 0.79   GFR >60 >60   GLUCOSE 122 121   CA 9.1 9.3   MAGNESIUM 1.5*  --  Recent Labs     06/13/20  1637 06/14/20  0513   AST 21 19   ALT 19 15   ALK 47 47   PROTEIN 5.7* 5.6*   ALBUMIN 3.8 3.7     Lab Results   Component Value Date    TROPONIN <0.03 06/14/2020    TROPONIN <0.03 06/14/2020    TROPONIN <0.03 06/13/2020     Lab Results   Component Value Date    BNAP 342 (H) 06/13/2020     No results found for: CHOLESTEROL, LDL, HDL, TRIGLYCERIDE, NONHDL    Imaging/Studies:  06/13/2020 CXR FINDINGS AND IMPRESSION:   Cardiac and mediastinal contours are normal.    The lungs and pleural spaces are clear.  There is no pneumothorax.  No acute bone or soft tissue abnormality.    06/14/2020 TTE CONCLUSIONS:  The left ventricle is normal in size.  There is mild concentric increase in the wall thickness of the left ventricle.  Global left ventricular function is normal.  The right ventricle is normal size.  The right ventricular systolic function is normal.  Normal valve function.    EKG: Sinus rhythm with 1st deg AVB and 2:1 conduction, LAFB, RBBB, left axis deviation, nonspecific T wave abnormalities    Telemetry:  Intermittent complete heart block with V-pacing, occasionally has intrinsic sinus rhythm with rates in the 70s    I personally reviewed the above labs and imaging studies.        ASSESSMENT/PLAN     #Complete Heart Block  S/p temporary transvenous pacemaker placed 6/30 by Dr. Dimas Aguas. Metoprolol was held overnight, this morning patient was in his own intrinsic sinus rhythm, HR 70s. Later on developed CHB with pacer dependence. TTE done 7/1 reveals normal LV size/fxn, mild conc increase in LV wall thickness. Normal RV size/fxn, no valvular disease.   --all AV nodal blocking agents have been discontinued  --EP consult - case discussed with Dr. Sofie Hartigan   --NPO at [facility] for [facility] tomorrow, may need to transfer to [place] for this  --Covid PCR negative 6/30  --continuous telemetry  2.   #Hypertension  BP 140-150/60-80s. Likely some level of compensatory HTN with CHB, will not make any adjustments peri-procedurally.   --continue HCTZ 41m daily  --continue losartan 542mdaily    #Hyperlipidemia  02/2020 Lipid Panel: TC 105, TG 42, HDL 51, LDL 46  --continue atrovastatin 1064mHS    #Type II Diabetes  Hgb A1C 5.2% 02/2020  --hold metformin while inpatient  --correctional SSI lispro and BG POCT AC/HS    Inpatient Checklist:   FEN:  Adult Diet Regular; Carbohydrate managed; Heart healthy   Prophylaxis: SCDs, no heparin given PPM planned 7/2   Disposition: pending procedure and clinical course   Code Status: Full Code   Primary Emergency Contact: WILSharene SkeansNP, ARNBlue Rapidssociate  [facility]   Pager: [phone] I spent 45 minutes caring for this patient while on the medical unit reviewing the data in EMR, coordinating care, discussing medical therapy, and counseling the patient and/or surrogate about the above medical problems.

## 2020-06-14 NOTE — Nursing Note (Signed)
TELE STRIP

## 2020-06-14 NOTE — Progress Notes (Signed)
Shift Summary:   74 year old man presents after syncopal episode:    A/O x4, denies pain, head CT completed last noc,  Transvenous pacer : VVI 70, MA 7, Sense 1.5; V paced > 75%, Map > 60, skin warm, all pulses intact, NPO since MN, resting comfortably.

## 2020-06-15 ENCOUNTER — Encounter (HOSPITAL_COMMUNITY): Payer: Self-pay | Admitting: Anesthesiology

## 2020-06-15 ENCOUNTER — Inpatient Hospital Stay (HOSPITAL_COMMUNITY): Payer: Medicare PPO

## 2020-06-15 ENCOUNTER — Encounter (HOSPITAL_COMMUNITY)
Admission: EM | Disposition: A | Discharge status: Home / Self Care | Payer: Self-pay | Source: Skilled Nursing Facility | Attending: Cardiovascular Disease

## 2020-06-15 ENCOUNTER — Encounter (HOSPITAL_COMMUNITY): Payer: Self-pay

## 2020-06-15 DIAGNOSIS — R9431 Abnormal electrocardiogram [ECG] [EKG]: Secondary | ICD-10-CM

## 2020-06-15 DIAGNOSIS — I10 Essential (primary) hypertension: Secondary | ICD-10-CM

## 2020-06-15 DIAGNOSIS — E669 Obesity, unspecified: Secondary | ICD-10-CM

## 2020-06-15 DIAGNOSIS — Z452 Encounter for adjustment and management of vascular access device: Secondary | ICD-10-CM

## 2020-06-15 DIAGNOSIS — E119 Type 2 diabetes mellitus without complications: Secondary | ICD-10-CM

## 2020-06-15 DIAGNOSIS — R55 Syncope and collapse: Secondary | ICD-10-CM

## 2020-06-15 DIAGNOSIS — Z6836 Body mass index (BMI) 36.0-36.9, adult: Secondary | ICD-10-CM

## 2020-06-15 LAB — EKG 12 LEAD
Atrial Rate: 62 {beats}/min
P Axis: 50 degrees
P-R Interval: 202 ms
Q-T Interval: 454 ms
QRS Duration: 150 ms
QTC Calculation: 460 ms
R Axis: -86 degrees
T Axis: 30 degrees
Ventricular Rate: 62 {beats}/min

## 2020-06-15 LAB — BASIC METABOLIC PANEL
Anion Gap: 5 (ref 4–12)
Calcium: 9.3 mg/dL (ref 8.9–10.2)
Carbon Dioxide, Total: 29 meq/L (ref 22–32)
Chloride: 103 meq/L (ref 98–108)
Creatinine: 0.76 mg/dL (ref 0.51–1.18)
Glucose: 129 mg/dL — ABNORMAL HIGH (ref 62–125)
Potassium: 3.4 meq/L — ABNORMAL LOW (ref 3.6–5.2)
Sodium: 137 meq/L (ref 135–145)
Urea Nitrogen: 13 mg/dL (ref 8–21)
eGFR by CKD-EPI: >60 mL/min/{1.73_m2} (ref >59)

## 2020-06-15 LAB — CBC, DIFF
% Basophils: 1 %
% Eosinophils: 3 %
% Immature Granulocytes: 0 %
% Lymphocytes: 23 %
% Monocytes: 7 %
% Neutrophils: 66 %
% Nucleated RBC: 0 %
Absolute Eosinophil Count: 0.2 10*3/uL (ref 0.00–0.50)
Absolute Lymphocyte Count: 1.4 10*3/uL (ref 1.00–4.80)
Basophils: 0.03 10*3/uL (ref 0.00–0.20)
Hematocrit: 44 %
Hemoglobin: 15.1 g/dL (ref 13.0–18.0)
Immature Granulocytes: 0.01 10*3/uL (ref 0.00–0.05)
MCH: 30.9 pg (ref 27.3–33.6)
MCHC: 34.6 g/dL (ref 32.2–36.5)
MCV: 89 fL (ref 81–98)
Monocytes: 0.44 10*3/uL (ref 0.00–0.80)
Neutrophils: 4.01 10*3/uL (ref 1.80–7.00)
Nucleated RBC: 0 10*3/uL
Platelet Count: 122 10*3/uL — ABNORMAL LOW (ref 150–400)
RBC: 4.89 10*6/uL (ref 4.40–5.60)
RDW-CV: 12.2 % (ref 11.6–14.4)
WBC: 6.09 10*3/uL (ref 4.3–10.0)

## 2020-06-15 LAB — GLUCOSE, FINGERSTICK POC
Glucose, Finger Stick POC: 135 mg/dL — ABNORMAL HIGH (ref 62–125)
Glucose, Finger Stick POC: 125 mg/dL (ref 62–125)
Glucose, Finger Stick POC: 104 mg/dL (ref 62–125)
Glucose, Finger Stick POC: 93 mg/dL (ref 62–125)

## 2020-06-15 LAB — TROPONIN_I
Troponin_I Interpretation: NORMAL
Troponin_I Interpretation: NORMAL
Troponin_I: <0.03 ng/mL (ref <0.04)
Troponin_I: 0.03 ng/mL (ref <0.04)

## 2020-06-15 SURGERY — PACEMAKER INITIAL IMPLANT - DUAL CHAMBER
Anesthesia: Moderate Sedation | Site: Heart Electrophysiology

## 2020-06-15 SURGERY — PACEMAKER INITIAL IMPLANT - DUAL CHAMBER
Anesthesia: Monitor Anesthesia Care | Site: Heart Electrophysiology

## 2020-06-15 MED ORDER — MIDAZOLAM HCL (PF) 2 MG/2ML IJ SOLN
INTRAMUSCULAR | Status: AC
Start: 2020-06-15 — End: ?
  Filled 2020-06-15: qty 2

## 2020-06-15 MED ORDER — SODIUM CHLORIDE 0.9 % IV SOLN
1.0000 g | INTRAVENOUS | Status: AC
Start: 2020-06-15 — End: 2020-06-15
  Administered 2020-06-15: 1 g
  Filled 2020-06-15: qty 5

## 2020-06-15 MED ORDER — LIDOCAINE HCL (PF) 2 % IJ SOLN
INTRAMUSCULAR | Status: AC
Start: 2020-06-15 — End: ?
  Filled 2020-06-15: qty 20

## 2020-06-15 MED ORDER — MIDAZOLAM HCL (PF) 2 MG/2ML IJ SOLN
0.5000 mg | INTRAMUSCULAR | Status: DC | PRN
Start: 2020-06-15 — End: 2020-06-15
  Administered 2020-06-15: 0.5 mg via INTRAVENOUS

## 2020-06-15 MED ORDER — ONDANSETRON HCL 4 MG OR TABS
4.0000 mg | ORAL_TABLET | Freq: Three times a day (TID) | ORAL | Status: DC | PRN
Start: 2020-06-15 — End: 2020-06-16

## 2020-06-15 MED ORDER — LOSARTAN POTASSIUM 50 MG OR TABS
50.0000 mg | ORAL_TABLET | Freq: Once | ORAL | Status: AC
Start: 2020-06-15 — End: 2020-06-15
  Administered 2020-06-15: 50 mg via ORAL
  Filled 2020-06-15: qty 1

## 2020-06-15 MED ORDER — FENTANYL CITRATE (PF) 100 MCG/2ML IJ SOLN
25.0000 ug | INTRAMUSCULAR | Status: DC | PRN
Start: 2020-06-15 — End: 2020-06-15
  Administered 2020-06-15 (×2): 25 ug via INTRAVENOUS

## 2020-06-15 MED ORDER — FENTANYL CITRATE (PF) 100 MCG/2ML IJ SOLN
INTRAMUSCULAR | Status: AC
Start: 2020-06-15 — End: ?
  Filled 2020-06-15: qty 2

## 2020-06-15 MED ORDER — CEFAZOLIN SODIUM-DEXTROSE 2-4 GM/100ML-% IV SOLN
2.0000 g | INTRAVENOUS | Status: AC
Start: 2020-06-15 — End: 2020-06-15
  Administered 2020-06-15: 2 g via INTRAVENOUS
  Filled 2020-06-15: qty 100

## 2020-06-15 MED ORDER — LOSARTAN POTASSIUM 50 MG OR TABS
100.0000 mg | ORAL_TABLET | Freq: Every day | ORAL | Status: DC
Start: 2020-06-16 — End: 2020-06-16
  Administered 2020-06-16: 100 mg via ORAL
  Filled 2020-06-15: qty 2

## 2020-06-15 MED ORDER — SENNOSIDES 8.6 MG OR TABS
17.2000 mg | ORAL_TABLET | Freq: Two times a day (BID) | ORAL | Status: DC | PRN
Start: 2020-06-15 — End: 2020-06-16

## 2020-06-15 MED ORDER — ONDANSETRON HCL 4 MG/2ML IJ SOLN
4.0000 mg | Freq: Three times a day (TID) | INTRAMUSCULAR | Status: DC | PRN
Start: 2020-06-15 — End: 2020-06-16

## 2020-06-15 MED ORDER — LIDOCAINE HCL 2 % IJ SOLN
INTRAMUSCULAR | Status: DC | PRN
Start: 2020-06-15 — End: 2020-06-15
  Administered 2020-06-15: 3 mL via SUBCUTANEOUS
  Administered 2020-06-15: 5 mL via SUBCUTANEOUS
  Administered 2020-06-15: 10 mL via SUBCUTANEOUS

## 2020-06-15 SURGICAL SUPPLY — 10 items
ADHESIVE SKIN DERMABOND .36 ML (Closure Device) ×2 IMPLANT
COVER FLEX STERILE LF LIGHT HANDLE PLSTC DISP GRN 1EA/PK (Other) ×2 IMPLANT
DRAPE 74X41IN UNIV VELCRO POLY MOBILE XRAY STRAP C (Drape) ×2 IMPLANT
HEMOSTAT SURGICEL (Drug) ×2 IMPLANT
LEAD PACEMAKER TENDRIL STS 52CM (Lead) ×2 IMPLANT
LEAD PACEMAKER TENDRIL STS 58CM (Lead) ×2 IMPLANT
PACEMAKER MRI DUAL CHAMBER ASSURITY DR-RF (Pacemaker) ×2 IMPLANT
SHEATH INTRODUCER SAFESHEATH II 6FR 19.5CM TEARAWAY (Sheath) ×4 IMPLANT
SUTURE POLYSORB 2-0 GS-21 36IN UNDYED (Suture) ×2 IMPLANT
SUTURE POLYSORB 3-0 C-14 30IN UNDYED (Suture) ×2 IMPLANT

## 2020-06-15 NOTE — Discharge Instructions (Signed)
Caring for Your Pacemaker  What you need to know  Who to Call  If you have questions about your appointments, call the Cardiology Clinic at [phone]  If you have a question about your procedure, care after your procedure, or your pacemaker follow-up:  Weekdays between 8 a.m. and 4:30 p.m., call [phone] .  Weekends, holidays, or after hours, call [phone] and ask to page the CARD E Fellow on call. The CARD E Fellow is a doctor who works with your primary electrophysiologist. These doctors specialize in caring for patients with pacemakers.  For all urgent concerns, call 911.  If You Go Home The Same Day  Before you are discharged, a doctor or nurse practitioner will talk with you about the results ofyour procedure and check your insertion site.   For Your Safety  You had anesthesia or sedation for this procedure. This medicine can make you sleepy and make it hard for you to think clearly. Because of this:  A responsible adult must take you home. You may not take a bus, shuttle, taxi, or any other transportation by yourself.   For the next 24 hours:  Do NOT drive. Make sure you have a responsible adult who can help you during this time.  Do NOT be responsible for the care of anyone else, such as children or an adult who needs care.   Do NOT drink alcohol, or take drugs other than the ones your doctors prescribed or suggested.   Do NOT make important decisions or sign legal papers.  Wound Care  Call one of the numbers listed under "Who to Call" on page 1 right away if you have:  Redness or swelling  Drainage  Fever higher than 100.4F (38C)  Chills  Any concerns or questions about your wound  Remove your dressing (bandage) within 24 hours after your surgery.  Your wound was closed with skin glue. You may shower 48 hours after your surgery, but do not soak your incision until it is fully healed.  When you shower:  Gently clean the wound with mild soap and water. Do not scrub or rub the area.  Gently pat dry  with a clean towel.  Do not put lotion or powder on your incision until it is fully healed.  Avoid touching the area over or around your pacemaker. Do not poke or twist your pacemaker.  Add some padding to your seat belt strap if it crosses over your pacemaker implant site. Keep this padding in place until your wound is fully healed.  Do not let anyone poke or probe your incision with fingers or instruments before checking with us first. If a healthcare provider feels this needs to be done to provide your care, have them call the EP doctor before doing this type of exam or procedure.  Your wound will be checked 7 to 14 days after your surgery. It is very important that you go to this appointment and all your follow-up visits.   See the appointment page you were given by your nurse for the dates and times of your follow-up visits.   Activity Restrictions  If you have travel plans in the first 2 weeks after your surgery, please check with your doctor for more instructions.  For the first 30 days after your surgery:  Do not push, pull, strain, twist, or make any sudden jerking motions with your arms or upper body.  With the arm that is on the same side as your surgery:  Do   NOT lift anything that weighs more than 10 pounds   (4.54 kilograms). This includes pets, groceries, children, trash, and laundry. (A gallon of water weighs almost 9 pounds.)  Do NOT lift your elbow higher than your shoulder.   Do NOT raise your arm over your head.  Do NOT lift anything over your head.  Do NOT put your arm behind your back  Long-Term  Follow your doctor's instructions about driving and sexual activity.  Check with your doctor before you do activities that:  Involve swinging your arm, such as swimming, golfing, tennis, or vacuuming  Could injure your shoulder or wound site, such as shooting a gun, wrestling, or playing football.   You may need to follow these restrictions for 3 months or longer, based on your treatment plan.  Keep  following these instructions:  Avoid touching the area over or around your pacemaker.   Do not poke or twist your pacemaker.  Special Long-Term Precautions  When You Travel  Always carry your pacemaker identification (ID) card.   If you must pass through a metal detector, hand your pacemaker ID card to security staff. Tell them that you might set off the alarm. You may also want to carry a card with this information in the language(s) of the country (or countries) you will visit or travel through.  Although some studies show there is no risk to internal pacemakers from metal detectors, most makers still advise being careful:   You may walk through the metal-detection arch, but do not stay inside the arch or lean on the sides of the structure.   If security staff use a metal-detection hand wand, ask them to avoid waving or holding it over your pacemaker.  If you feel dizzy or have fast heartbeats (palpitations) when you are near a metal detector, move farther away. Your pacemaker should begin to work properly right away.   Body scanners that are used in some airports and other places will not affect your pacemaker.  Always carry a full list of your current medicines. Include their doses, how often you take them each day, and why you are taking them.   Always carry your medicines with you in your carry-on bags. Do not pack them in your checked luggage.  Carry phone numbers for your healthcare providers in case of emergency.  Magnetic Fields  Magnets or magnetic fields are found in or are created by many items we are around every day. Some of these are:  Small appliances with motors  Stereo speakers  Gas engines  Cell phones  Desktop and laptop computers  Welding machines  CB radios  Magnetic resonance imaging (MRI) machines  Anti-theft devices in stores  Metal detectors  It is safe to be near microwaves, blenders, and most of the items on the list. But some create strong magnetic fields that will interfere with your  pacemaker.   Basic advice and precautions for sources of magnetic fields are on pages 5 and 6. For more details, contact the maker of your pacemaker.  NOTE: These suggestions assume that the equipment is properly grounded, in proper working order, and being used for its intended purpose.   Cell Phones  Most cell phones are safe if you:  Keep it at least 6 inches (15 centimeters) from your surgical site.  Hold it to your ear on the opposite side of your pacemaker.   Carry it at least 6 inches (15 centimeters) away from your implant site. Most cell phones keep   sending a signal even when they are not in use.  Radio Transmitters  Radio transmitters such as CB radios, walkie-talkies, and remote- controlled toys may interfere with your pacemaker. Based on how many watts a device generates, here are guidelines for how far to keep from it:  Watts Generated How Far to Keep From It   3 watts 12 inches (30 centimeters)   25 to 199 watts  3 feet (1 meter)   200 watts or more 10 feet (3 meters)   If you do not know the power output of your device, call the maker of the device.  Car Engines and Tools or Appliances with Small Motors  Most small appliances or hand tools with motors are safe to use when the item is grounded, in good condition, and held the right distance from your body for safe and proper operation.  Use caution when working on car engines. Keep your upper body at least 24 inches (60 centimeters) away from a running car engine.  Electric Toothbrushes  Keep the toothbrush handle at least 1 inch (2.5 centimeters) away from your pacemaker.  Keep at least 6 inches (15 centimeters) between the charger and your pacemaker.  Medical Procedures or Equipment  Always tell your providers and healthcare personnel that you have a pacemaker. Never assume everyone involved in your healthcare knows this.   These procedures or devices may interfere with your pacemaker:  Electrocautery (a surgical procedure that uses an electric  current to remove unwanted tissue, seal off blood vessels, or create an incision)  Placement of a central venous catheter (a catheter that is placed in a large vein to deliver medicine)  Lithotripsy (a procedure that uses shock waves to break up stones in the kidney, bladder, or ureter)  Ultrasound (an imaging technique)  Electrolysis (hair removal)  External cardioversion or defibrillation (a procedure to bring an abnormal heart rhythm back to normal)  Magnetic resonance imaging (MRI) machines  Note: At this time, MRI machines are not safe for most patients with pacemakers to be in or around. But, some new pacemakers can be near an MRI machine. You must avoid MRI exams unless your doctor tells you that your pacemaker is MRI-safe.  Hearing aid with a coil around the neck that detects sounds and sends digital signals to the amplifier  Note: Before using this type of hearing aid, call your pacemaker maker.   Radiation therapy (usually used in cancer treatment)  TENS (transcutaneous electrical nerve stimulation) for pain control  Body-fat measuring scales  Other Precautions  Many makers of pacemakers advise that you also avoid being around or using these items:  Arc welders  Gas-powered chainsaws  Induction furnaces such as kilns  Magnetic (therapy) mattress pads or pillows  Electric steel furnaces  Dielectric heaters (heaters that use radiowaves or microwaves to heat)  Electrical transmission towers (if you are inside a restricted area)  Jackhammers  Stun guns  Dental Work  Tell your dentist that you have a pacemaker. Most times, you do not need to be concerned that any of the devices the dentist uses will interfere with your pacemaker.  If you did not need antibiotics for your dental procedures before your surgery, then you will not need antibiotics now that you have a pacemaker. If you have questions, please ask your doctor.  Your Pacemaker  This handout covers only basic precautions you may need to take with your  pacemaker. Each device may have other special precautions you will need to   follow.    If you have any questions about what might cause problems for your new pacemaker, please contact the maker for more specific information.   Here is contact information for 4 companies that make pacemakers:  [employer]     [employer]       www.[email or web address]     www.[email or web address]     [phone] or [phone]  [phone]       [employer]   [employer]  www.guidant.com    www.medtronic.com   866.GUIDANT (866.484.3268)  [phone]   [email or web address]   [phone]   [phone]    Questions?  Your questions are important. Call your doctor or healthcare provider if you have questions or concerns.   For general questions weekdays from 8 a.m. to 5 p.m.: Call the [facility] at [phone].  For urgent concerns after hours or on a weekend or holiday: Call [phone] and ask to page the Cardiology E fellow on call.

## 2020-06-15 NOTE — Nursing Note (Signed)
TV in place evening of 6/30 throught 7/2 am. Permanent pacer placed this morning.  St. Jude dual [place], DDDR, 60 , 130.   Arrived to unit after RN report received.  Post sheath dressing CDI,no hematoma.  Pacer site CDI with pressure dressing.  No c/o pain, chest pain or SOB.  Doing well on room air.  Follow-up EKG and chest xray done.  Wife at bedside. Stable for transfer to SCU.       Illness Severity  Severe

## 2020-06-15 NOTE — Nursing Note (Signed)
Patient Summary  74 year old man presents after syncopal episode. Stated he walked into his backyard, had sudden blurring of his vision and awoke on the ground.  Found to be bradycardic in the 30s by paramedics.6/30      TV place evening of 6/30.  This morning back up rate turned down to 50s with goal of removing this afternoon.  SR in 60s for a couple hours but then became dependent on the pacer. Seen by cardiology, rate increased to 60.  Had poor capture.  Milliamps gradually increased, now at 14 with good capture.  Sensing remains 1.5.     Overnight:   A/O X4, TVP VVI @60 , Miliamps 14, Sensing 1.5.   Plan for permanent pacemaker at 0900 am.  CHG done.     Illness Severity  Severe

## 2020-06-15 NOTE — Nursing Note (Signed)
Patient Summary  74 year old man presents after syncopal episode. Stated he walked into his backyard, had sudden blurring of his vision and awoke on the ground.  Found to be bradycardic in the 30s by paramedics.6/30      Transfer from ICU after permanent pacer placed this morning.  St. Jude dual chamber, DDDR, 60 , 130.   Arrived to unit after RN report received.  Post sheath dressing CDI,no hematoma.  Pacer site CDI with pressure dressing.  No c/o pain, chest pain or SOB.  Doing well on room air.  Follow-up EKG and chest xray done.  Wife at bedside. Hypertensive to 200s, 50mg  losartan given and attending notified. Improvement to 150s SBP. Tele NSR BBB, occasional paced beats.       Illness Severity  stable

## 2020-06-15 NOTE — Progress Notes (Addendum)
Telemetry Progress Note:    Day Shift Summary      ACTIVE CARDIAC MONITORING: Yes     ADMIT TO TELE: 1335    D/C FROM TELE:    CARDIAC RHYTHM:SR / V-PACED    HEART RATE RANGE: 70-73    ECTOPY:NA    FREQUENCY:NA    EVENTS:NA    Additional Information: Telemetry strips attached

## 2020-06-15 NOTE — Progress Notes (Signed)
CARDIOLOGY PROGRESS NOTE    [facility]")- DOB: August 21, 1946 (75 year old male)  Admit Date: 06/13/2020 - Length of Stay: 2  Code Status: Full Code    ID: 74yo man with type 2 diabetes, HTN, obesity, and bilateral lower extremity lymphedema presents with syncope and bradycardia found to have CHB for which TVP was placed 6/30.      SUBJECTIVE     INTERVAL EVENTS:  --no acute overnight events  --denies any chest pain/pressure, SOB, dizziness or lightheadedness  --back up rate decreased back to 50bpm this AM as pt had own intrinsic rhythm again in the 60-70s. Telemetry reviewed overnight and revealed frequent V-pacing with underlying CHB. No further failure to capture, mA adjusted from 11=>14.   --spouse at bedside    ROS:  Complete review of systems has been obtained and is negative except as above.    SCHEDULED MEDICATIONS:   atorvastatin, 10 mg, Daily  ceFAZolin 1 g in NS 500 mL irrigation, 1 g, Pre-Op  ceFAZolin, 2 g, Pre-Op  hydroCHLOROthiazide, 25 mg, Daily  insulin LISPRO, 0-4 Units, q HS  insulin LISPRO, 0-5 Units, TID before meals  losartan, 50 mg, Daily  prazosin, 1 mg, BID    INFUSED MEDICATIONS:  sodium chloride, 3 mL/hr, Last Rate: 3 mL/hr (06/14/20 0700)    PRN MEDICATIONS:  PRN medications: acetaminophen, atropine, dextrose, dextrose, glucagon, glucagon, insulin LISPRO, oxyCODONE, perflutren lipid microsphere (Definity) in NS injection, sodium chloride          OBJECTIVE   BP (!) 173/91   Pulse 70   Temp 36.2 C (Temporal)   Resp 19   Ht '5\' 8"'$  (1.727 m)   Wt (!) 104.7 kg (230 lb 13.2 oz)   SpO2 98%   BMI 35.10 kg/m   Weight for the past 168 hrs:   Weight   06/15/20 0536 (!) 104.7 kg (230 lb 13.2 oz)   06/14/20 0630 (!) 105.4 kg (232 lb 5.8 oz)   06/13/20 1800 (!) 112.6 kg (248 lb 3.8 oz)   06/13/20 1538 (!) 116.1 kg (255 lb 15.3 oz)       Intake/Output Summary (Last 24 hours) at 06/15/2020 5284  Last data filed at 06/15/2020 0400  Intake --   Output 1725 ml   Net -1725 ml     Physical  Exam:  CONSTITUTIONAL: elderly male in NAD laying in bed  HEENT: Normocephalic/atraumatic.  Conjunctivae pink, sclerae clear. Oral mucosa pink, MMM.   NECK:  Jugular venous pressure normal. RIJ introducer with TVP secured in place.   RESPIRATORY:  Respiratory effort is normal on room air.  Lungs are clear to auscultation bilaterally.  No wheezes, rales, or rhonchi.  CARDIOVASCULAR:  Regular rhythm.  S1, S2 normal.  No murmur, rub or gallop.  Pedal pulses normal bilaterally.    GASTROINTESTINAL: Normoactive bowel tones.  Soft, non-tender, non-distended.   EXTREMITIES:  No clubbing or cyanosis, bilateral chronic LE lymphedema L>R.   MUSCULOSKELETAL:  No injury or gross deformities.   NEURO:  Awake, alert, oriented x 3.  Grossly non-focal.  PSYCH:  Calm, cooperative.  SKIN:  Warm and dry, no lesions.    DIAGNOSTIC DATA:   Recent Labs     06/13/20  1637 06/14/20  0513 06/15/20  0724   WBC 7.30 7.25 6.09   HEMOGLOBIN 13.9 14.3 15.1   HEMATOCRIT 39 42 44   PLATELET 144* 139* 122*     Recent Labs     06/13/20  1637 06/14/20  0513   SODIUM 137 139   POTASSIUM 3.9 3.9   CL 103 104   CO2 25 29   BUN 14 14   CREATININE 0.78 0.79   GFR >60 >60   GLUCOSE 122 121   CA 9.1 9.3   MAGNESIUM 1.5*  --      Recent Labs     06/13/20  1637 06/14/20  0513   AST 21 19   ALT 19 15   ALK 47 47   PROTEIN 5.7* 5.6*   ALBUMIN 3.8 3.7     Lab Results   Component Value Date    TROPONIN <0.03 06/15/2020    TROPONIN <0.03 06/14/2020    TROPONIN <0.03 06/14/2020     Lab Results   Component Value Date    BNAP 342 (H) 06/13/2020     No results found for: CHOLESTEROL, LDL, HDL, TRIGLYCERIDE, NONHDL    Imaging/Studies:  06/13/2020 CXR FINDINGS AND IMPRESSION:   Cardiac and mediastinal contours are normal.    The lungs and pleural spaces are clear.  There is no pneumothorax.  No acute bone or soft tissue abnormality.    06/14/2020 TTE CONCLUSIONS:  The left ventricle is normal in size.  There is mild concentric increase in the wall thickness of the left  ventricle.  Global left ventricular function is normal.  The right ventricle is normal size.  The right ventricular systolic function is normal.  Normal valve function.    EKG: Sinus rhythm with 1st deg AVB and 2:1 conduction, LAFB, RBBB, left axis deviation, nonspecific T wave abnormalities    Telemetry: Intermittent complete heart block with V-pacing, occasionally has intrinsic sinus rhythm with rates in the 70s    I personally reviewed the above labs and imaging studies.        ASSESSMENT/PLAN     #Complete Heart Block  S/p temporary transvenous pacemaker placed 6/30 by Dr. Dimas Aguas. Metoprolol was held overnight, 7/1 AM patient was in his own intrinsic sinus rhythm, HR 70s then later developed CHB with pacer dependence. Continues to have frequent episodes of V-pacing with underlying CHB.   7/1 TTE: normal LV size/fxn, mild conc increase in LV wall thickness. Normal RV size/fxn, no valvular disease.   --all AV nodal blocking agents have been discontinued  --NPO at [facility] for PPM today at 0900 with Dr. Almyra Free He  --Covid PCR negative 6/30  --continuous telemetry  2.   #Hypertension  BP 140-150/60-80s. Likely some level of compensatory HTN with CHB, will not make any adjustments peri-procedurally.   --continue HCTZ 54m, losartan 539mdaily, prazosin 62m32mID    #Hyperlipidemia  02/2020 Lipid Panel: TC 105, TG 42, HDL 51, LDL 46  --continue atrovastatin 61m90mS    #Type II Diabetes  Hgb A1C 5.2% 02/2020  --hold metformin while inpatient  --correctional SSI lispro and BG POCT AC/HS    Inpatient Checklist:   FEN:  NPO diet NPO except for medications   Prophylaxis: SCDs, no heparin given PPM planned 7/2   Disposition: pending procedure and clinical course   Code Status: Full Code   Primary Emergency Contact: WILLSharene SkeansP, ARNPWordenociate  [facility]   Pager: [phone] spent 45 minutes caring for this patient while on the medical unit reviewing the data in EMR,  coordinating care, discussing medical therapy, and counseling the patient and/or surrogate about the above medical problems.

## 2020-06-15 NOTE — Nursing Note (Signed)
Interdisciplinary rounds:   Pt off unit in EP lab getting permanent pacemaker at this time. Admitted for high degree AVB - collapsed in yard at home. Had tranvenous pacer overnight. K+ to be repleted this shift. Pt to downgrade post-pacer placement.

## 2020-06-15 NOTE — Progress Notes (Signed)
Telemetry Progress Note        Rhythm: v pace    Rate range: Low: 60 To High: 70    Ectopy: None

## 2020-06-16 ENCOUNTER — Inpatient Hospital Stay (HOSPITAL_COMMUNITY): Payer: Medicare PPO

## 2020-06-16 DIAGNOSIS — E1122 Type 2 diabetes mellitus with diabetic chronic kidney disease: Secondary | ICD-10-CM

## 2020-06-16 DIAGNOSIS — I2510 Atherosclerotic heart disease of native coronary artery without angina pectoris: Secondary | ICD-10-CM

## 2020-06-16 DIAGNOSIS — I455 Other specified heart block: Secondary | ICD-10-CM

## 2020-06-16 DIAGNOSIS — I10 Essential (primary) hypertension: Secondary | ICD-10-CM

## 2020-06-16 DIAGNOSIS — E1140 Type 2 diabetes mellitus with diabetic neuropathy, unspecified: Secondary | ICD-10-CM

## 2020-06-16 LAB — EKG 12 LEAD
Atrial Rate: 67 {beats}/min
Diagnosis: NORMAL
P Axis: 45 degrees
P-R Interval: 194 ms
Q-T Interval: 440 ms
QRS Duration: 152 ms
QTC Calculation: 464 ms
R Axis: -81 degrees
T Axis: 53 degrees
Ventricular Rate: 67 {beats}/min

## 2020-06-16 LAB — GLUCOSE, FINGERSTICK POC: Glucose, Finger Stick POC: 126 mg/dL — ABNORMAL HIGH (ref 62–125)

## 2020-06-16 MED ORDER — LOSARTAN POTASSIUM 100 MG OR TABS
100.0000 mg | ORAL_TABLET | Freq: Every day | ORAL | 3 refills | Status: DC
Start: 2020-06-17 — End: 2021-07-03

## 2020-06-16 NOTE — Progress Notes (Signed)
Telemetry Progress Note    Night Shift Summary    Active Cardiac Monitoring:  Yes    Cardiac Rhythm: A.V.PACED/ Sinus with BBB    Current HR: 73    Rate Range: 61 - 68    Events: N/A    Additional info: Attached Tele Strips for Review

## 2020-06-16 NOTE — Nursing Note (Signed)
Patient Summary  74 year old man presents after syncopal episode. Stated he walked into his backyard, had sudden blurring of his vision and awoke on the ground.  Found to be bradycardic in the 30s by paramedics.6/30      TV in place evening of 6/30 throught 7/2 am. Permanent pacer placed this morning.  St. Jude dual [place], DDDR, 60 , 130. Post sheath dressing CDI,no hematoma.  Pacer site CDI with pressure dressing.  No c/o pain, chest pain or SOB. On RA. Follow-up EKG to be done this morning before am.  SBP in the 140s to 160s.    Edited by: Lance Sell Imelda at 06/16/2020 0501    Illness Severity  Stable  Edited byMarland Kitchen Darral Dash at 06/16/2020 0501

## 2020-06-16 NOTE — Discharge Summary (Signed)
Discharge Summary     Stephen [facility] [address]") - DOB: 03-23-1946 [id number] year old male)  PCP: Stephen Chin, MD   Code Status: Full Code     DATE OF ADMISSION: 06/13/2020  DATE OF DISCHARGE: 06/16/2020  DISCHARGE TEAM & ATTENDING: Cardiology & Theodoro Grist, MD     ADMISSION DIAGNOSIS: Complete heart block  DISCHARGE DIAGNOSIS: Complete heart block [facility])    [facility] PROBLEM LIST:   Principal Problem:    Complete heart block (HCC)  Active Problems:    Coronary artery disease involving native coronary artery of native heart without angina pectoris    Type 2 diabetes mellitus with diabetic neuropathy, without long-term current use of insulin    Essential (primary) hypertension  Resolved Problems:    AV block      DISCHARGE FOLLOW-UP VISITS/APPOINTMENTS:    Upcoming appointments at [facility]:  Future Appointments   Date Time Provider Department Center   06/28/2020  1:00 PM Elsie Saas, PA-C [facility] Heart   06/28/2020  2:00 PM DEVICE RM 1 [facility]   06/28/2020  2:20 PM Branham, Lyla Son, ARNP [facility]        Additional follow-up:  No follow-up provider specified.    PENDING RESULTS THAT REQUIRE FOLLOW-UP (as of this summary):  Pending Labs     No pending labs          DIAGNOSTIC STUDIES RECOMMENDED:  None     INCIDENTAL FINDINGS THAT REQUIRE FOLLOW-UP:   None      THERAPEUTIC RECOMMENDATIONS:   None    ALLERGIES:  Strawberry c [ascorbate]      DISCHARGE MEDICATIONS:   Current Discharge Medication List      START taking these medications    Details   acetaminophen 325 MG tablet Take 1-2 tablets (325-650 mg) by mouth every 4 hours as needed for mild pain, moderate pain or severe pain.  Qty: 60 tablet, Refills: 0         CONTINUE these medications which have CHANGED    Details   losartan 100 MG tablet Take 1 tablet (100 mg) by mouth daily.  Qty: 90 tablet, Refills: 3    Associated Diagnoses: Essential (primary) hypertension         CONTINUE these medications which have NOT CHANGED     Details   atorvastatin 10 MG tablet Take 10 mg by mouth daily.       fluticasone propionate 50 MCG/ACT nasal spray Spray 2 sprays into each nostril daily as needed for allergies.      hydroCHLOROthiazide 25 MG tablet Take 25 mg by mouth daily.       metFORMIN 1000 MG tablet Take 1,000 mg by mouth 2 times a day with meals.       prazosin 1 MG capsule Take 1 mg by mouth 2 times a day.          STOP taking these medications       naproxen (Aleve) 220 MG capsule Comments:   Reason for Stopping:               BRIEF ADMISSION HISTORY:   Please see the admission H&P dated on June 13, 2020 for additional details.  Briefly, [name] is a 74 year old man with essential hypertension, diabetes and hyperlipidemia who presented to the [facility] with syncope and found to have a high degree AV block.      [facility] COURSE:     1. Status  post St. Jude Pacemaker -temporary venous pacemaker was placed on June 13, 2020.  He was previously taking metoprolol for essential hypertension which was held.  There was temporary restoration of normal sinus rhythm followed by recurrent complete heart block.  A Saint Jude dual-chamber pacemaker was placed on June 15, 2020.  Postoperative course was uncomplicated.  Device interrogation demonstrated a normal functioning pacemaker and chest x-ray demonstrated stable lead positioning.  -Post discharge instructions provided to the patient  -Follow-up in device clinic      2. Essential hypertension - metoprolol discontinued and losartan increased to 100 mg. HCTZ continued at 25 mg.     DISPOSITION:    01 HOME/SELF CARE [01]    CONDITION: good     CONSULTS COMPLETED:    ED GENERAL CONSULT     OPERATIONS/PROCEDURES:  Surgical/Procedural Cases on this Admission     Case IDs Date Procedure Surgeon Location Status    [phone] 06/13/20 TVP wire placement Florestine Avers, MD [facility] CARDIAC CATH LAB Comp    [phone] 06/15/20 Pacemaker Initial Implant - Dual Chamber He, Loura Back, MD [facility] ELECTROPHYSIOLOGY LAB  Comp          Additional procedures:  None       No discharge procedures on file.    DISCHARGE PHYSICAL EXAM:   Vitals (Most recent in last 24 hrs)     T: 37.2 C (06/16/20 0754)  BP: (!) 160/90 (06/16/20 0754)  HR: 82 (06/16/20 0754)  RR: 18 (06/16/20 0617)  SpO2: 96 % (06/16/20 0617)  T range: Temp  Min: 36.5 C  Max: 37.2 C  Admit weight: (!) 116.1 kg (255 lb 15.3 oz) (06/13/20 1538)  Last weight: (!) 109.9 kg (242 lb 4.6 oz) (06/16/20 0617)       Physical Exam  Constitutional: well-appearing    Eyes: Conjunctivae and lids without xanthelasma  Neck: Jugular veins not distended  CHEST: PPM site c/d/i  Respiratory: Normal respiratory effort. Clear to auscultation without rales or rhonchi.  Cardiovascular: Regular rate and rhythm. Normal S1 and S2. No murmurs.  Gastrointestinal: Soft, nontender, bowel sounds are active.  Extremities: Nailbeds and fingertips of upper extremities reveal no cyanosis, or clubbing. Normal capillary refill  Skin: Inspection reveals no stasis dermatitis   Neurologic: Normal mood and affect      ATTENDING TIME STATEMENT:   I spent more than 30 minutes on [facility] discharge day management.    [facility] physicians mentioned in this note can be reached by calling [facility] at [phone]. If any part of this transcript is missing or to request other transcripts for this patient call [phone]. For online access to patient records enroll in [facility] at [email or web address].

## 2020-06-16 NOTE — Nursing Note (Signed)
Patient Summary  74 year old man presents after syncopal episode. Stated he walked into his backyard, had sudden blurring of his vision and awoke on the ground.  Found to be bradycardic in the 30s by paramedics.6/30      TV in place evening of 6/30 throught 7/2 am. Permanent pacer placed this morning.  St. Jude dual [place], DDDR, 60 , 130. Post sheath dressing CDI,no hematoma.  Pacer site CDI with pressure dressing.  No c/o pain, chest pain or SOB. On RA. Follow-up EKG to be done this morning before am.  SBP in the 140s to 160s.  PIV removed, discharge instruction given to pt who verbalized understanding.       Illness Severity  Stable

## 2020-06-19 LAB — GLUCOSE, FINGERSTICK POC: Glucose, Finger Stick POC: 200 mg/dL — ABNORMAL HIGH (ref 62–125)

## 2020-06-26 ENCOUNTER — Other Ambulatory Visit: Payer: Self-pay

## 2020-06-28 ENCOUNTER — Ambulatory Visit (INDEPENDENT_AMBULATORY_CARE_PROVIDER_SITE_OTHER): Payer: Self-pay

## 2020-06-28 ENCOUNTER — Encounter (INDEPENDENT_AMBULATORY_CARE_PROVIDER_SITE_OTHER): Payer: Self-pay | Admitting: Neurology

## 2020-06-28 ENCOUNTER — Ambulatory Visit (INDEPENDENT_AMBULATORY_CARE_PROVIDER_SITE_OTHER): Payer: Medicare PPO | Admitting: Neurology

## 2020-06-28 ENCOUNTER — Other Ambulatory Visit (INDEPENDENT_AMBULATORY_CARE_PROVIDER_SITE_OTHER): Payer: Medicare PPO

## 2020-06-28 ENCOUNTER — Ambulatory Visit (INDEPENDENT_AMBULATORY_CARE_PROVIDER_SITE_OTHER): Payer: Medicare PPO | Admitting: Nurse Practitioner

## 2020-06-28 DIAGNOSIS — I442 Atrioventricular block, complete: Secondary | ICD-10-CM

## 2020-06-28 DIAGNOSIS — Z45018 Encounter for adjustment and management of other part of cardiac pacemaker: Secondary | ICD-10-CM

## 2020-06-28 DIAGNOSIS — I10 Essential (primary) hypertension: Secondary | ICD-10-CM

## 2020-06-28 DIAGNOSIS — Z950 Presence of cardiac pacemaker: Secondary | ICD-10-CM

## 2020-06-28 DIAGNOSIS — N1832 Chronic kidney disease, stage 3b: Secondary | ICD-10-CM | POA: Diagnosis not present

## 2020-06-28 NOTE — Progress Notes (Signed)
Cardiovascular Clinical Evaluation Note    Primary Care Provider: Horton Chin, MD    CHIEF COMPLAINT: Hospital follow-up visit    HISTORY OF PRESENT ILLNESS: 74 year old man with essential hypertension, diabetes and hyperlipidemia who presented to the hospital with syncope and found to have a high degree AV block.      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 2nd day of the heat wave felt tired. The next day mowed lawn, picking up after his dog and fell over. He did not think he totally blacked out, did not hit his head. He went back inside and decided to go to go in. Called medics who said pulse was low.     Patient had a syncopal event he was found to have a complete heart block.  He was not found to have any additional cardiac abnormality.  He had a pacemaker placed July 2.  The nodal blockers are being avoided in the setting of the complete heart block    Current status: Patient has not had any syncopal events since he had his pacemaker placed he is not had any lightheadedness or dizziness.  He has normal exertion.  No chest pain or dyspnea on exertion or shortness of breath.  His Chronic history of lower extremity edema he says his leg related to venous stasis it is improved significantly since he has lost weight recently does not have any new lower extremity swelling.    Regular  PHYSICAL EXAM   BP (!) (P) 148/78   Pulse (P) 70   Ht (P) 5\' 8"  (1.727 m)   Wt (!) (P) 110.9 kg (244 lb 9.6 oz)   BMI (P) 37.19 kg/m    CONSTITUTIONAL: This is a well-nourished male  in no distress.    NEURO/PSYCH:  Oriented x 3 with normal affect.  NECK: Jugular venous pressure normal.  Thyroid is not enlarged.  RESPIRATORY:  Respiratory effort is normal.  Lungs are clear to percussion/auscultation. CARDIOVASCULAR: PMI at 5th ICS/MCL.  Regular rhythm.  S1,  S2 normal.  No S3, S4, no murmur.   His pacemaker site is well-healing no signs of infection.  Extremities trace bilateral lower extremity edema.  GEXTREMITIES: No clubbing or cyanosis.     IMPRESSION:        1. Status post St. Jude Pacemaker -temporary venous pacemaker was placed on June 13, 2020.  He was previously taking metoprolol for essential hypertension which was held.  There was temporary restoration of normal sinus rhythm followed by recurrent complete heart block.  A Saint Jude dual-chamber pacemaker was placed on June 15, 2020.  Postoperative course was uncomplicated.  Device interrogation demonstrated a normal functioning pacemaker and chest x-ray demonstrated stable lead positioning.  -Had some mild discomfort at the pacemaker site the first few days now this is resolved.  He is not noticing fever chills or sweats no other concerns it appears that the site is well-healing on exam today  -Follow-up in device clinic: We will see the EP nurse practitioner today for pacemaker check site check    2. Essential hypertension - metoprolol discontinued and losartan increased to 100 mg. HCTZ continued at 25 mg.    2. HLD; continue with statins    3.  Appears he had a stress echo in 2016 that was negative.  He has a strong family history of heart disease.  He says his PCP recently checked his cholesterol this year and the numbers were great he is on a statin medication.  I do not have the cholesterol numbers to review but would like him to have cholesterol panel once a year when he sees his general cardiology MD next visit they should seek out the recent cholesterol numbers and if not available reorder a lab    PLAN:  Get a blood pressure cuff and monitor blood pressure at home for the next 1 to 2 weeks record the results please call the clinic in 1 to 2 weeks and update [place] on the blood pressure readings.  If remains high at home readings we can consider increasing the HCTZ or adding another agent.  -Return to  clinic in 1-2 months to establish care with one of the general cardiology MDs    Raiford Simmonds, PA-C    This document was created with the assistance of voice recognition software/dictation.      Supplemental data:     PROBLEM LIST:  Patient Active Problem List    Diagnosis Date Noted   . Essential (primary) hypertension [I10] 06/16/2020   . Complete heart block (HCC) [I44.2] 06/13/2020     Added automatically from request for surgery [id number]     . Type 2 diabetes mellitus with diabetic neuropathy, without long-term current use of insulin [E11.40] 07/10/2016     Formatting of this note might be different from the original.  Decreased sensation greater toes bilaterally  Formatting of this note might be different from the original.  Decreased sensation greater toes bilaterally  Decreased sensation greater toes bilaterally     . Coronary artery disease involving native coronary artery of native heart without angina pectoris [I25.10] 07/11/2011     Formatting of this note might be different from the original.  Calcium score 75-90% dec 2015  Stress echo january 2016 negative (did not obtain max heart rate)  Family hx positive 2 Brother 40's and dad 21's cad  Calcium score 75-90% dec 2015  Stress echo january 2016 negative (did not obtain max heart rate)  Family hx positive 2 Brother 40's and dad 47's cad         MEDICATIONS:    Current Outpatient Medications   Medication Sig Dispense Refill   . acetaminophen 325 MG tablet Take 1-2 tablets (325-650 mg) by mouth every 4 hours as needed for mild pain, moderate pain or severe pain. 60 tablet 0   . atorvastatin 10 MG tablet Take 10 mg by mouth daily.      . fluticasone propionate 50 MCG/ACT nasal spray Spray 2 sprays into each nostril daily as needed for allergies.     . hydroCHLOROthiazide 25 MG tablet Take 12.5 mg by mouth daily.      [name] Kitchen losartan 100 MG tablet Take 1 tablet (100 mg) by mouth daily. 90 tablet 3   . metFORMIN 1000 MG tablet Take 1,000 mg by mouth 2 times a day  with meals.      . prazosin 1 MG capsule Take 1 mg by mouth 2 times a day.        No current facility-administered medications for this visit.        ALLERGIES:  Strawberry c [ascorbate] and Strawberry extract

## 2020-06-28 NOTE — Patient Instructions (Signed)
-   Your pacemaker is working appropriately, it has at least 10 years of battery life before replacement is needed.    - Activity restrictions are partially released. Begin to use your arm to a greater degree by slowly raising up until you feel a gentle stretch, hold for 5 seconds and slowly lower down.  Continue to increase how high you raise your arm.  You will need to avoid any abrupt or jarring movements (such as golfing, rowing, swimming) until your next visit with Victorino Dike, ARNP.    - You may shower regularly taking care not to scrub or massage the area.  Do not soak/submerge the incision in water (bathing/swimming) until a complete line of scar is seen (no scabs). To prevent infection please do not apply any lotions, powders, ointments or oils to the incision.    - Continue to look for signs of infection such as fever, chills, rashes, incision drainage or redness. If any of these occur or if you have any concerns about your incision or implanted device, please call [facility] [phone] and ask to speak with one of our nurses.    - Your device will be checked in 1 month via remote, if you have any concerns whatsoever, please make sure to call.  Your next in clinic visit will be in 3 months with Candice Camp, please schedule this appointment on your way out today.

## 2020-06-28 NOTE — Patient Instructions (Addendum)
-  Get a blood pressure cuff and monitor blood pressure at home for the next 1 to 2 weeks record the results please call the clinic in 1 to 2 weeks and update [place] on the blood pressure readings.  If remains high at home readings we can consider increasing the HCTZ or adding another agent.      -Return to clinic in 1-2 months to establish care with one of the general cardiology MDs

## 2020-06-29 DIAGNOSIS — Z950 Presence of cardiac pacemaker: Secondary | ICD-10-CM | POA: Insufficient documentation

## 2020-06-29 NOTE — Progress Notes (Signed)
[facility]  Electrophysiology Clinical Evaluation    Primary Care Provider: Horton Chin, MD  Primary Cardiologist: Theodoro Grist, MD  Implant Electrophysiologist: Prutkin, [place], MD    ENCOUNTER REASON:  Device Check (Post op, wound check)     HISTORY OF PRESENT ILLNESS:  Stephen Eaton is a 74 year old with a history of CAD per Ca+ scoring with - stress echo, DM2, HTN, HLD who presented with syncope and found in complete heart block requiring transvenous pacing. Low dose metoprolol held, negative cardiac enzymes and normal echocardiogram with persistence of block therefore St Jude dual pacer placed 06/15/20 without complications.     The patient presents today for initial surgical site and device evaluation.  He comes with no acute pacemaker concerns.  The implant site is overall comfortable there has been no interval fevers.  Patient does like to hike and discussed ways of protecting incision site from backpack strap from rubbing was discussed.  The patient does endorse occasional lightheadedness that he reports that occur with to VD, these are short in duration and not severe, he reports these do not give him much concern.  No palpitations or episodes of syncope.    PAST MEDICAL/SURGICAL HISTORY:  Medical/surgical history reviewed and updated as necessary     MEDICATIONS:    Current Outpatient Medications   Medication Sig Dispense Refill   . acetaminophen 325 MG tablet Take 1-2 tablets (325-650 mg) by mouth every 4 hours as needed for mild pain, moderate pain or severe pain. 60 tablet 0   . atorvastatin 10 MG tablet Take 10 mg by mouth daily.      . fluticasone propionate 50 MCG/ACT nasal spray Spray 2 sprays into each nostril daily as needed for allergies.     . hydroCHLOROthiazide 25 MG tablet Take 12.5 mg by mouth daily.      [name] Kitchen losartan 100 MG tablet Take 1 tablet (100 mg) by mouth daily. 90 tablet 3   . metFORMIN 1000 MG tablet Take 1,000 mg by  mouth 2 times a day with meals.      . prazosin 1 MG capsule Take 1 mg by mouth 2 times a day.        No current facility-administered medications for this visit.        ALLERGIES:  Strawberry c [ascorbate] and Strawberry extract      SOCIAL/FAMILY HISTORY: See HPI    REVIEW OF SYSTEMS:  Systems reviewed with pertinent positives and negatives noted in HPI.     PHYSICAL EXAM:  CONSTITUTIONAL:  This is a well-nourished male in no distress who ambulates to the clinic room.  VITALS:  See flow sheet INTEGUMENTARY: Left upper chest pacer pocket has well approximated incision edges.  Soft swelling present. No hematoma, drainage, erythema, dehiscence/pending dehiscence NEURO:  Awake, alert, oriented to person, place and time.  No gross focal abnormalities. PSYCH:  Normal affect. Conversing appropriately.    MOST RECENT CARDIAC DIAGNOSTIC STUDIES:  ECHOCARDIOGRAM 1/21 normal  MYOCARDIAL PERFUSION IMAGING N/A  ANGIOGRAPHY N/A    IN CLINIC DEVICE INTERROGATION: In clinic device interrogation/programming personally performed.  Resenting rhythm is normal sinus with intact conduction, overall pacing requirements are 17% in the atrium and 6.5% in the ventricle.  There is one short episode of SVT.  No episodes of high ventricular rates.  Lead findings are stable.  Heart rate histogram suggest chronotropic competence.  Please see media for pdf report    IMPRESSION/PLAN :  Stephen Eaton is a very pleasant 61 year old with hx of CAD per Ca+ scoring with - stress echo, DM2, HTN, HLD, complete heart block with syncope s/p St Jude pacer implant    #Complete heart block  #Cardiac pacemaker in situ  -Surgical site is healing in normal fashion without acute concerns.  Wound care reviewed (see AVS)  -Stable device check.  No clinically significant tachyarrhythmias.  Monitoring enabled.    #SVT  -Brief episode noted on pacemaker log, asymptomatic.  We will continue to monitor.    FOLLOW UP:  -We will initiate quarterly remote  pacemaker interrogations, starting in 1 month.  Next in clinic device evaluation in 3 months, sooner with concerns.

## 2020-07-03 DIAGNOSIS — M545 Low back pain: Secondary | ICD-10-CM | POA: Diagnosis not present

## 2020-07-05 DIAGNOSIS — E559 Vitamin D deficiency, unspecified: Secondary | ICD-10-CM | POA: Diagnosis not present

## 2020-07-05 DIAGNOSIS — N1832 Chronic kidney disease, stage 3b: Secondary | ICD-10-CM | POA: Diagnosis not present

## 2020-07-05 DIAGNOSIS — I129 Hypertensive chronic kidney disease with stage 1 through stage 4 chronic kidney disease, or unspecified chronic kidney disease: Secondary | ICD-10-CM | POA: Diagnosis not present

## 2020-07-05 DIAGNOSIS — E875 Hyperkalemia: Secondary | ICD-10-CM | POA: Diagnosis not present

## 2020-07-11 DIAGNOSIS — M545 Low back pain: Secondary | ICD-10-CM | POA: Diagnosis not present

## 2020-07-13 DIAGNOSIS — M545 Low back pain: Secondary | ICD-10-CM | POA: Diagnosis not present

## 2020-07-30 ENCOUNTER — Other Ambulatory Visit (INDEPENDENT_AMBULATORY_CARE_PROVIDER_SITE_OTHER): Payer: Medicare PPO

## 2020-07-30 DIAGNOSIS — I442 Atrioventricular block, complete: Secondary | ICD-10-CM

## 2020-07-30 DIAGNOSIS — Z950 Presence of cardiac pacemaker: Secondary | ICD-10-CM

## 2020-07-30 DIAGNOSIS — Z45018 Encounter for adjustment and management of other part of cardiac pacemaker: Secondary | ICD-10-CM

## 2020-07-31 ENCOUNTER — Ambulatory Visit (INDEPENDENT_AMBULATORY_CARE_PROVIDER_SITE_OTHER): Payer: Medicare PPO

## 2020-07-31 ENCOUNTER — Telehealth (INDEPENDENT_AMBULATORY_CARE_PROVIDER_SITE_OTHER): Payer: Self-pay | Admitting: Nurse Practitioner

## 2020-07-31 DIAGNOSIS — Z45010 Encounter for checking and testing of cardiac pacemaker pulse generator [battery]: Secondary | ICD-10-CM

## 2020-07-31 DIAGNOSIS — Z45018 Encounter for adjustment and management of other part of cardiac pacemaker: Secondary | ICD-10-CM

## 2020-07-31 NOTE — Telephone Encounter (Signed)
Spoke to Stephen Eaton to check on his pacemaker incision site. He reports that the incision is healing nicely. There is no drainage, redness or swelling. He hasn't had any fevers. He had a few questions about restrictions. I referenced [name]'s recent office note and reiterated what activities to avoid until his next visit sometime in October.     I requested a photo through [employer] however patient stated his cell phone is limited in it's capabilities.

## 2020-08-04 ENCOUNTER — Encounter (INDEPENDENT_AMBULATORY_CARE_PROVIDER_SITE_OTHER): Payer: Self-pay | Admitting: Interventional Cardiology

## 2020-08-15 ENCOUNTER — Ambulatory Visit (INDEPENDENT_AMBULATORY_CARE_PROVIDER_SITE_OTHER): Payer: Medicare PPO | Admitting: Interventional Cardiology

## 2020-08-15 ENCOUNTER — Encounter (INDEPENDENT_AMBULATORY_CARE_PROVIDER_SITE_OTHER): Payer: Self-pay | Admitting: Interventional Cardiology

## 2020-08-15 VITALS — BP 128/86 | HR 84 | Ht 68.0 in | Wt 253.9 lb

## 2020-08-15 DIAGNOSIS — E782 Mixed hyperlipidemia: Secondary | ICD-10-CM

## 2020-08-15 DIAGNOSIS — I442 Atrioventricular block, complete: Secondary | ICD-10-CM

## 2020-08-15 DIAGNOSIS — E1140 Type 2 diabetes mellitus with diabetic neuropathy, unspecified: Secondary | ICD-10-CM

## 2020-08-15 DIAGNOSIS — Z950 Presence of cardiac pacemaker: Secondary | ICD-10-CM

## 2020-08-15 DIAGNOSIS — I10 Essential (primary) hypertension: Secondary | ICD-10-CM

## 2020-08-15 DIAGNOSIS — Z45018 Encounter for adjustment and management of other part of cardiac pacemaker: Secondary | ICD-10-CM

## 2020-08-15 NOTE — Progress Notes (Signed)
CARDIOLOGY NEW PATIENT    ASSESSMENT & PLAN:    Stephen Eaton is an 74 year old male with PPM, here to establish cardiac care    1. S/P PPM; following AV block; 2:1. Device interrogated recently and noted pacing 17% in the atrium and 6.5% in the ventricle.  There is one short episode of SVT.  No episodes of high ventricular rates.  Lead findings are stable.  Heart rate histogram suggest chronotropic competence. Clinically stable and denies any active complains   a. Follow up with device clinic per schedule.  2. HTN; well controlled on current regimen, to be continued  3. HLD; on Atorva 10, tolerating well and lipid panel in normal ranges  4. DM; well controlled on oral hypoglycemics  5. Lymphedema; compliant with compression stockings  6. Obesity; working on weight loss   7. BPH; on Prazosin    Follow up in 1 year and earlier if needed  _______________________________________________________________________________________    REASON/SOURCE OF CONSULTATION:  Chief Complaint   Patient presents with   . New Patient     establish care with gen cardiology, pt reports concerned about the activities he can and cannot partake in now that he has a PM       HISTORY OF PRESENT ILLNESS:  Stephen Eaton is an 74 year old male with medical history as listed below; here to establish cardiac care.     Denies chest pain, pressure, or tightness. Denies shortness of breath, dyspnea, orthopnea, PND, early satiety, bloating or edema. Denies palpitations, lightheadedness or dizziness. Denies syncope or pre-syncope. Denies fatigue, or any significant change in activity levels. Able to walk up stairs without issues.     The ASCVD Risk score Mikey Bussing DC Jr., et al., 2013) failed to calculate for the following reasons:    Cannot find a previous HDL lab    Cannot find a previous total cholesterol lab     CARDIAC HISTORY/ DATA:  EKG: normal sinus rhythm and RBBB.  . Echocardiogram: Normal LVEF, and valve functions  . Stress test:  None  . Cardiac cath/ PCI: None    PAST MEDICAL HISTORY/PROBLEM LIST:  No past medical history on file.  Patient Active Problem List   Diagnosis   . Complete heart block ([place])   . Coronary artery disease involving native coronary artery of native heart without angina pectoris   . Type 2 diabetes mellitus with diabetic neuropathy, without long-term current use of insulin   . Essential (primary) hypertension   . Pacemaker; St Jude       REVIEW OF SYSTEMS:  Relevant cardiovacular ROS noted in HPI, otherwise a full ROS was obtained on the [facility], which I reviewed and it will be scanned.    SOCIAL HISTORY:  Social History     Socioeconomic History   . Marital status: Married     Spouse name: Not on file   . Number of children: Not on file   . Years of education: Not on file   . Highest education level: Not on file   Occupational History   . Not on file   Social Needs   . Financial resource strain: Not on file   . Food insecurity     Worry: Not on file     Inability: Not on file   . Transportation needs     Medical: Not on file     Non-medical: Not on file   Tobacco Use   . Smoking  status: Never Smoker   . Smokeless tobacco: Never Used   Substance and Sexual Activity   . Alcohol use: Yes     Alcohol/week: 2.0 standard drinks     Types: 2 Glasses of wine per week     Frequency: 4 or more times a week     Drinks per session: 1 or 2     Comment: a couple drinks daily    . Drug use: Yes     Types: Marijuana     Comment: occas    . Sexual activity: Not on file   Lifestyle   . Physical activity     Days per week: Not on file     Minutes per session: Not on file   . Stress: Not on file   Relationships   . Social Product manager on phone: Not on file     Gets together: Not on file     Attends religious service: Not on file     Active member of club or organization: Not on file     Attends meetings of clubs or organizations: Not on file     Relationship status: Not on file   . Intimate partner  violence     Fear of current or ex partner: Not on file     Emotionally abused: Not on file     Physically abused: Not on file     Forced sexual activity: Not on file   Other Topics Concern   . Not on file   Social History Narrative   . Not on file       FAMILY HISTORY:      ALL:  Review of patient's allergies indicates:  Allergies   Allergen Reactions   . Strawberry C [Ascorbate] Resp: Shortness of Breath   . Strawberry Extract Anaphylaxis       MEDS:  Outpatient Medications Prior to Visit   Medication Sig Dispense Refill   . acetaminophen 325 MG tablet Take 1-2 tablets (325-650 mg) by mouth every 4 hours as needed for mild pain, moderate pain or severe pain. 60 tablet 0   . atorvastatin 10 MG tablet Take 10 mg by mouth daily.      . fluticasone propionate 50 MCG/ACT nasal spray Spray 2 sprays into each nostril daily as needed for allergies.     . hydroCHLOROthiazide 25 MG tablet Take 12.5 mg by mouth daily.      [name] Kitchen losartan 100 MG tablet Take 1 tablet (100 mg) by mouth daily. 90 tablet 3   . metFORMIN 1000 MG tablet Take 1,000 mg by mouth 2 times a day with meals.      . Naproxen Sodium (ALEVE OR) Take by mouth.     . prazosin 1 MG capsule Take 1 mg by mouth 2 times a day.        No facility-administered medications prior to visit.        PHYSICAL EXAM:  VITALS - BP 128/86   Pulse 84   Ht 5' 8"  (1.727 m)   Wt (!) 115.2 kg (253 lb 14.4 oz)   BMI 38.61 kg/m   CONSTITUTIONAL - no apparent distress, alert and oriented x 3, pleasant and interactive, appears stated age, well developed and well nourished  HEENT - normocephalic, atraumatic, sclerae clear, anterior oropharynx without lesions  NECK - supple, trachea midline, JVD: no distention  RESP - lungs clear to auscultation bilaterally, symmetric chest expansion, no wheezes/crackles/rales   CARDIOVASCULAR -  RRR, audible S1 and S2,  no murmur,  no gallops/rubs  ABD/GI - soft, nontender, nondistended, intact bowel sounds, no obvious organomegaly   MSK - warm and dry  extremities, no lesions,  clubbing, edema, pulses: 2+  SKIN - no jaundice, no erythema, no rashes or cyanosis  NEURO - speech and gait grossly intact, moves all extremities    OTHER DATA:    Results for orders placed or performed during the hospital encounter of 99/83/38   Basic Metabolic Panel   Result Value Ref Range    Sodium 137 135 - 145 meq/L    Potassium 3.4 (L) 3.6 - 5.2 meq/L    Chloride 103 98 - 108 meq/L    Carbon Dioxide, Total 29 22 - 32 meq/L    Anion Gap 5 4 - 12    Glucose 129 (H) 62 - 125 mg/dL    Urea Nitrogen 13 8 - 21 mg/dL    Creatinine 0.76 0.51 - 1.18 mg/dL    Calcium 9.3 8.9 - 10.2 mg/dL    eGFR, Calculated >60 >59 mL/min/[1.73_m2]    GFR, Information       Calculated GFR by CKD-EPI equation. Inaccurate with changing renal function. See [email or web address].     Results for orders placed or performed during the hospital encounter of 06/13/20   CBC   Result Value Ref Range    WBC 7.25 4.3 - 10.0 10*3/uL    RBC 4.56 4.40 - 5.60 10*6/uL    Hemoglobin 14.3 13.0 - 18.0 g/dL    Hematocrit 42 38 - 50 %    MCV 92 81 - 98 fL    MCH 31.4 27.3 - 33.6 pg    MCHC 34.0 32.2 - 36.5 g/dL    Platelet Count 139 (L) 150 - 400 10*3/uL    RDW-CV 12.4 11.6 - 14.4 %     Results for orders placed or performed during the hospital encounter of 06/13/20   Comprehensive Metabolic Panel   Result Value Ref Range    Sodium 139 135 - 145 meq/L    Potassium 3.9 3.6 - 5.2 meq/L    Chloride 104 98 - 108 meq/L    Carbon Dioxide, Total 29 22 - 32 meq/L    Anion Gap 6 4 - 12    Glucose 121 62 - 125 mg/dL    Urea Nitrogen 14 8 - 21 mg/dL    Creatinine 0.79 0.51 - 1.18 mg/dL    Protein (Total) 5.6 (L) 6.0 - 8.2 g/dL    Albumin 3.7 3.5 - 5.2 g/dL    Bilirubin (Total) 0.9 0.2 - 1.3 mg/dL    Calcium 9.3 8.9 - 10.2 mg/dL    AST (GOT) 19 9 - 38 U/L    Alkaline Phosphatase (Total) 47 36 - 161 U/L    ALT (GPT) 15 10 - 48 U/L    eGFR, Calculated >60 >59 mL/min/[1.73_m2]    GFR, Information        Calculated GFR by CKD-EPI equation. Inaccurate with changing renal function. See http://[email or web address].     No results found for this or any previous visit.  No results found for this or any previous visit.  No results found for this or any previous visit.  No results found for this or any previous visit.      No diagnosis found.      --  Meda Klinefelter, MD, MPH, [facility]  Clinical Assistant Professor of [employer] [facility], [place] [address]  [phone]  [phone]  [phone]  E: [email or web address]

## 2020-09-04 ENCOUNTER — Other Ambulatory Visit: Payer: Self-pay | Admitting: Gastroenterology

## 2020-09-04 DIAGNOSIS — R109 Unspecified abdominal pain: Secondary | ICD-10-CM | POA: Diagnosis not present

## 2020-09-04 DIAGNOSIS — R112 Nausea with vomiting, unspecified: Secondary | ICD-10-CM

## 2020-09-04 DIAGNOSIS — Z86010 Personal history of colonic polyps: Secondary | ICD-10-CM | POA: Diagnosis not present

## 2020-09-04 DIAGNOSIS — R634 Abnormal weight loss: Secondary | ICD-10-CM | POA: Diagnosis not present

## 2020-09-10 ENCOUNTER — Telehealth (INDEPENDENT_AMBULATORY_CARE_PROVIDER_SITE_OTHER): Payer: Self-pay | Admitting: Nurse Practitioner

## 2020-09-10 DIAGNOSIS — I4891 Unspecified atrial fibrillation: Secondary | ICD-10-CM

## 2020-09-10 NOTE — Telephone Encounter (Signed)
Alert transmission received for multiple episodes of AF w/ RVR. No anticoag documented. Longest episode lasting >14 hrs in duration. Atrial undersensing noted. Programmed sensitivity 0.5 mV with measured value today 5 mV. Also noted, device detected HVR appears consitent with AF w/RVR with a max V rate of 227 bpm lasting 6 mins 56 secs. Cannot rule out dual arrhythmia.    Please see Media for scanned documents on complete device information.

## 2020-09-11 ENCOUNTER — Ambulatory Visit
Admission: RE | Admit: 2020-09-11 | Discharge: 2020-09-11 | Disposition: A | Discharge status: Home / Self Care | Payer: Medicare Other | Source: Ambulatory Visit | Attending: Gastroenterology | Admitting: Gastroenterology

## 2020-09-11 ENCOUNTER — Other Ambulatory Visit (INDEPENDENT_AMBULATORY_CARE_PROVIDER_SITE_OTHER): Payer: Self-pay | Admitting: Nurse Practitioner

## 2020-09-11 ENCOUNTER — Ambulatory Visit: Payer: Medicare PPO | Attending: Nurse Practitioner

## 2020-09-11 DIAGNOSIS — I4891 Unspecified atrial fibrillation: Secondary | ICD-10-CM

## 2020-09-11 DIAGNOSIS — R109 Unspecified abdominal pain: Secondary | ICD-10-CM

## 2020-09-11 DIAGNOSIS — K7689 Other specified diseases of liver: Secondary | ICD-10-CM | POA: Diagnosis not present

## 2020-09-11 DIAGNOSIS — R112 Nausea with vomiting, unspecified: Secondary | ICD-10-CM

## 2020-09-11 DIAGNOSIS — K224 Dyskinesia of esophagus: Secondary | ICD-10-CM | POA: Diagnosis not present

## 2020-09-11 LAB — COMPREHENSIVE METABOLIC PANEL
ALT (GPT): 15 U/L (ref 10–48)
AST (GOT): 16 U/L (ref 9–38)
Albumin: 3.8 g/dL (ref 3.5–5.2)
Alkaline Phosphatase (Total): 47 U/L (ref 36–161)
Anion Gap: 6 (ref 4–12)
Bilirubin (Total): 0.8 mg/dL (ref 0.2–1.3)
Calcium: 9.2 mg/dL (ref 8.9–10.2)
Carbon Dioxide, Total: 27 meq/L (ref 22–32)
Chloride: 106 meq/L (ref 98–108)
Creatinine: 0.86 mg/dL (ref 0.51–1.18)
Glucose: 143 mg/dL — ABNORMAL HIGH (ref 62–125)
Potassium: 4.2 meq/L (ref 3.6–5.2)
Protein (Total): 5.7 g/dL — ABNORMAL LOW (ref 6.0–8.2)
Sodium: 139 meq/L (ref 135–145)
Urea Nitrogen: 17 mg/dL (ref 8–21)
eGFR by CKD-EPI: >60 mL/min/{1.73_m2} (ref >59)

## 2020-09-11 LAB — CBC (HEMOGRAM)
Hematocrit: 42 % (ref 38–50)
Hemoglobin: 14.6 g/dL (ref 13.0–18.0)
MCH: 31.8 pg (ref 27.3–33.6)
MCHC: 34.9 g/dL (ref 32.2–36.5)
MCV: 91 fL (ref 81–98)
Platelet Count: 152 10*3/uL (ref 150–400)
RBC: 4.59 10*6/uL (ref 4.40–5.60)
RDW-CV: 13.2 % (ref 11.6–14.4)
WBC: 5.91 10*3/uL (ref 4.3–10.0)

## 2020-09-11 LAB — THYROID STIMULATING HORMONE: Thyroid Stimulating Hormone: 1.002 u[IU]/mL (ref 0.400–5.000)

## 2020-09-11 MED ORDER — METOPROLOL SUCCINATE ER 50 MG OR TB24
50.0000 mg | EXTENDED_RELEASE_TABLET | Freq: Every day | ORAL | 3 refills | Status: DC
Start: 2020-09-11 — End: 2020-09-18

## 2020-09-11 MED ORDER — APIXABAN 5 MG OR TABS
5.0000 mg | ORAL_TABLET | Freq: Two times a day (BID) | ORAL | 3 refills | Status: AC
Start: 2020-09-11 — End: ?

## 2020-09-11 NOTE — Telephone Encounter (Signed)
Noted.  Transmission reviewed and demonstrates rapid atrial fibrillation episode, this is a new diagnosis.  Implant indication is for intermittent complete heart block with pacer implanted 06/15/20, other relevant hx includes HTN, DM2 and HLD. His most recent CV diagnostic data includes:  ECHO-stress 1/21 - Overall normal findings  PACER-remote 8/21 - Good battery life, good lead parameters, no sustained VT/VF events. Runs of atrial high rate episodes noted. Normal device. Routine followup.  LABS:          Current CV Meds:  Atorvastatin 10 mg daily  HCTZ 12.5 mg daily  Losartan 100 mg daily    CHADS-VASc  3 (HTN, Age-285, DM) correlates with unadjusted stroke rate of 3.2%  HAS-BLED 2-3 (HTN, Age-285, NSAID?) correlates to 01.88-3.74 blees per 100 pt-years.    PLAN:  -  Will telephone patient to inquire about symptoms with onset of event on 09/07/20 at 1144 am  -  Patient to send in a transmission as presenting rhythm was rapid atrial fibrillation/flutter  -  Metoprolol succinate 50 mg daily will be started for rate control  -  Check CBC, CMP and TSH  -  Stroke prophylaxis will need to be started with Eliquis 5 mg BID after confirmation no history of serious bleeding (none noted in chart)

## 2020-09-11 NOTE — Telephone Encounter (Addendum)
Called patient, no answer/busy signal.  Called emergency contact Daniel and left message to have patient call us back.   ------------------------------------------  Patient returned call, and we reviewed J.Branham's recommendations Metoprolol and Eliquis prescription sent to preferred pharmacy. Patient confirmed he does not have any history of serious bleeding. Aleve OD discontinued.     Lab orders placed and patient confirmed he will complete the lab work today.     Asked patient to monitor blood pressures, and report results in 1-2 weeks. Instructed patient we will call if the transmission warrants a new treatment plan.

## 2020-09-12 NOTE — Result Encounter Note (Signed)
Labs reviewed by RN at this time and are available to provider, Shirley Muscat ARNP  FU scheduled on: No scheduled appt on file

## 2020-09-14 DIAGNOSIS — M48062 Spinal stenosis, lumbar region with neurogenic claudication: Secondary | ICD-10-CM | POA: Diagnosis not present

## 2020-09-17 ENCOUNTER — Telehealth (INDEPENDENT_AMBULATORY_CARE_PROVIDER_SITE_OTHER): Payer: Self-pay | Admitting: Interventional Cardiology

## 2020-09-17 NOTE — Telephone Encounter (Signed)
Pt says heart rate has been sitting around 140 for the last couple of days. BP readings range from 114/88 to 142/103 (this morning) pt thinks it's the cuff.

## 2020-09-17 NOTE — Telephone Encounter (Signed)
-----   Message -----  From: Baltazar Najjar, MD  Sent: 09/17/2020   4:46 PM PDT    Not seen this patient - looks like saw Victorino Dike in the office post implant. Victorino Dike started Eliquis on 9/28 for new diagnosis of AF with rapid rates.     Not really a device setting issue - just an AF management issue. If he's feeling poorly with rapid rates, then probably coming in to ED for general cardiology eval and rate control makes sense. Otherwise, he should be set up to see Candise Bowens or a general cardiologist this week to optimize his rate control regimen.     If there are issues with rate control or desire for rhythm control, would be happy to see him in the office though I'm not scheduled to be back in the office until 10/25.     Jennette Kettle

## 2020-09-17 NOTE — Telephone Encounter (Signed)
Situation:  Heart rate sustained in 140's x 4 days, consistently, with BP's range 114/88 - 142/103.       Background:   Patient's last visit in office with J.Branham, on 06/28/20 follow up s/p Pacer placed 06/15/20 for complete heart block. Seen last by Dr Velora Heckler on 08/15/20 to establish care, "S/P PPM; following AV block; 2:1. Device interrogated recently and noted pacing 17% in the atrium and 6.5% in the ventricle. There is one short episode of SVT. No episodes of high ventricular rates. Lead findings are stable. Heart rate histogram suggest chronotropic competence. Clinically stable and denies any active complains   Follow up with device clinic per schedule.    Assessment:  Symptoms:  + Tachycardia: high rates 140's for 4 days.  + Fatigue  Denies weakness, dizziness, or rhythm irregularities. Had stopped Metoprolol Succ 50mg  when in hospital 7/1 with complete heart block and rates in the 30's. His heart rates were 80-114, and then he just restarted Metoprolol a few days ago. HR elevated in 140's for the last 4 days. Sounds like patient may be AF/RVR.     Recommendation:  Sent a transmission on 09/11/20 (see TE) found to be AF.   After work at 10:30pm tonight he sent another transmission. Pt never heard back on his previous transmission and said he is curious to know the results.  -Routed to designated EP Dr 09/13/20.

## 2020-09-17 NOTE — Telephone Encounter (Signed)
Appointment made to see Stephen Eaton 10/5 at 4:20pm. Instructed patient to seek emergency care if symptoms worsen or if he experiences SOB, dizziness, chest pain, etc. Patient agreeable to plan.

## 2020-09-18 ENCOUNTER — Ambulatory Visit (INDEPENDENT_AMBULATORY_CARE_PROVIDER_SITE_OTHER): Payer: Medicare PPO | Admitting: Nurse Practitioner

## 2020-09-18 ENCOUNTER — Encounter (INDEPENDENT_AMBULATORY_CARE_PROVIDER_SITE_OTHER): Payer: Self-pay | Admitting: Nurse Practitioner

## 2020-09-18 VITALS — BP 116/84 | HR 142 | Ht 68.0 in | Wt 248.2 lb

## 2020-09-18 DIAGNOSIS — I480 Paroxysmal atrial fibrillation: Secondary | ICD-10-CM

## 2020-09-18 DIAGNOSIS — I4891 Unspecified atrial fibrillation: Secondary | ICD-10-CM

## 2020-09-18 DIAGNOSIS — Z950 Presence of cardiac pacemaker: Secondary | ICD-10-CM

## 2020-09-18 DIAGNOSIS — I442 Atrioventricular block, complete: Secondary | ICD-10-CM

## 2020-09-18 DIAGNOSIS — Z45018 Encounter for adjustment and management of other part of cardiac pacemaker: Secondary | ICD-10-CM

## 2020-09-18 NOTE — Progress Notes (Signed)
[facility] [address]  Electrophysiology Clinical Evaluation    Primary Care Provider: Horton Chin, MD  Primary Cardiologist: Florestine Avers, MD  Implant Electrophysiologist: Prutkin, [place], MD    ENCOUNTER REASON:  Follow-Up  (Rapid heart rates causing fatigue.)     HISTORY OF PRESENT ILLNESS:  Stephen Eaton is a 74 year old with a history of HTN, HLD, NIDDM and intermittent complete heart block s/p St Jude pacemaker implantation 06/15/20 who was recently diagnosed with rapid atrial fibrillation on home pacemaker monitoring prompting resuming of metoprolol and initiation of Eliquis.    Stephen Eaton presents today to further discuss atrial fibrillation.  He reports to feel fatigued since the diagnosis and possibly an upper chest "buzzing" sensation. He notes his home heart rates have been rapid since metoprolol initiation. BP home log shows systolic readings of 116-156 mmHg. He denies any sense of palpitations nor any chest pain, shortness of breath, lightheadedness, dizziness or syncope.  No bleeding concerns since starting Eliquis.     We discussed what atrial fibrillation is and possible etiologies. Lab work showed normal thyroid stimulating hormone.  BMI is 37 and he is working on weight loss. Discussed sleep apnea of which he reports diagnosis about 20 years ago, started with CPAP and then subsequently was told he did not have OSA and ceased using CPAP machine. Currently he wakes up ~4x nightly for urination, previous to that he was told he snores.  Alcohol consumption is 1 glass/day.    PAST MEDICAL/SURGICAL HISTORY:  Medical/surgical history reviewed and updated as necessary     MEDICATIONS:    Current Outpatient Medications   Medication Sig Dispense Refill   . acetaminophen 325 MG tablet Take 1-2 tablets (325-650 mg) by mouth every 4 hours as needed for mild pain, moderate pain or severe pain. 60 tablet 0   . apixaban (Eliquis) 5 MG tablet Take 1 tablet (5 mg) by  mouth 2 times a day. 180 tablet 3   . atorvastatin 10 MG tablet Take 10 mg by mouth daily.      . fluticasone propionate 50 MCG/ACT nasal spray Spray 2 sprays into each nostril daily as needed for allergies.     . hydroCHLOROthiazide 25 MG tablet Take 25 mg by mouth daily.      [name] Kitchen losartan 100 MG tablet Take 1 tablet (100 mg) by mouth daily. 90 tablet 3   . metFORMIN 1000 MG tablet Take 1,000 mg by mouth 2 times a day with meals.      . metoprolol succinate ER 50 MG 24 hr tablet Take 1.5 tablets (75 mg) by mouth 2 times a day. Do not chew or crush.     . prazosin 1 MG capsule Take 1 mg by mouth 2 times a day.        No current facility-administered medications for this visit.        ALLERGIES:  Strawberry c [ascorbate] and Strawberry extract      SOCIAL/FAMILY HISTORY: No changes. Retired from [employer].  Hx of PhD program for speech but stopped just shy of 2 classes.  Currently works as a Arboriculturist at [employer].    REVIEW OF SYSTEMS:  Systems reviewed with pertinent positives and negatives noted in HPI.     PHYSICAL EXAM:    CONSTITUTIONAL:  This is a well-nourished male in no distress who ambulates without difficulty to the clinic room.    VITALS:  See flow sheet   HEENT: Normocephalic. Atraumatic.  Mucous membranes moist. Supple neck, unable to assess JVD.   RESP:   CTA   CV:  Irregularly irregular rapid rhythm.  S1 and S2 normal.  No MRG noted.    GI: Abdomen soft, non tender  GU: Deferred  EXTREMITIES:  No edema or tenderness noted.   SKIN: Left upper chest pacer pocket has newly formed scar.  Pulse generator is movable within normal limits. No tenderness, erythema, swelling or adhesions.    NEURO:  A&O x 3.  No gross focal abnormalities.   PSYCH:  Normal affect. Conversing appropriately.    MOST RECENT CARDIAC DIAGNOSTIC STUDIES:  ECHOCARDIOGRAM 1/21 normal  MYOCARDIAL PERFUSION IMAGING N/A  ANGIOGRAPHY N/A    IN CLINIC DEVICE INTERROGATION: Presenting rhythm is atrial fibrillation/rapid flutter (AA cycle length  200 ms with varying morphologies on EGM) with rapid ventricular response. Histograms show poor rate control while in AF. Multiple AMS episodes due to blanking, PVAB decreased to allow for detection/mode switch. Ventricular auto-capture not reliable due to fast rhythm, chronic lead static outputs programed. Battery longevity of at leas 9.2-10.3 years  Please see media for pdf report    IMPRESSION/PLAN :   Stephen Eaton is a very pleasant 74 year old with hx of HTN, HLD, NIDDM and intermittent complete heart block s/p St Jude pacemaker implantation 06/15/20 with newly diagnosed atrial fibrillation    # Rapid atrial fibrillation  - Symptoms of fatigue.   - Rates control: not well controlled. Will increase to 75 mg BID in step wise fashion. If BP issue then will decrease losartan.   - Rhythm control:  Stroke prophylaxis not initiated until day #3 of AF, no rhythm control at this time until appropriate duration of Eliquis. If not spontaneously converted in 3 weeks will plan for cardioversion.    - CHADS-VASc 3 - Eliquis initiated for stroke prophylaxis without bleeding concerns.  Reviewed concerning s/s as well as CVA s/s  - Sleep referral placed for assessment of possible OSA  - Weight management encourage    # Intermittent complete heart block  # Cardiac Pacemaker in situ  - Surgical site continues to heal without concern  - Device check per above. Stable lead findings, rapid AF present. Home monitoring enabled.    FOLLOW UP:  - Remote transmission in 3 weeks to determine if AF still present, if so then will proceed with cardioversion.  Dr. Velora Heckler, Mr. [facility] primary cardiologist, has been consulted and agrees with plan/cardioversion.

## 2020-09-18 NOTE — Patient Instructions (Addendum)
-    You have atrial fibrillation which is an abnormal rhythm of the upper chamber (atrium) which causes the lower chamber (ventricle) to beat rapidly.  For this we are going to increase your metoprolol:   - Beginning today take 1 tablet (50 mg) twice a day for 3 days.  Continue to take blood pressures and as long as these are staying above 100/60 and you are not dizzy then I would like for you to further increase the metoprolol to 1.5 tablets (75 mg) twice a day.  If you have any dizziness or shortness of breath please call.  -  Continue to take the Eliquis for stroke prevention.  Please call with any abnormal/excessive bruising or bleeding OR abdominal pains/changes to stool (black/tarry) or urine color  -  We will again check your device in 3 weeks and should you still be in atrial fibrillation we will need to consider a cardioversion.  -  I have placed a referral for a sleep study to be performed at your convenience.

## 2020-09-19 ENCOUNTER — Ambulatory Visit (INDEPENDENT_AMBULATORY_CARE_PROVIDER_SITE_OTHER): Payer: Medicare PPO

## 2020-09-19 DIAGNOSIS — I442 Atrioventricular block, complete: Secondary | ICD-10-CM

## 2020-09-19 DIAGNOSIS — Z45018 Encounter for adjustment and management of other part of cardiac pacemaker: Secondary | ICD-10-CM

## 2020-09-22 DIAGNOSIS — Z23 Encounter for immunization: Secondary | ICD-10-CM | POA: Diagnosis not present

## 2020-09-26 NOTE — Telephone Encounter (Signed)
Opened in error

## 2020-10-07 ENCOUNTER — Encounter (INDEPENDENT_AMBULATORY_CARE_PROVIDER_SITE_OTHER): Payer: Self-pay | Admitting: Nurse Practitioner

## 2020-10-08 DIAGNOSIS — Z01812 Encounter for preprocedural laboratory examination: Secondary | ICD-10-CM | POA: Diagnosis not present

## 2020-10-09 ENCOUNTER — Telehealth (INDEPENDENT_AMBULATORY_CARE_PROVIDER_SITE_OTHER): Payer: Self-pay | Admitting: Nurse Practitioner

## 2020-10-09 NOTE — Telephone Encounter (Signed)
Alert transmission received for episode of ongoing afib with rapid ventricular response. Episode appears to be ongoing since 10/06/20, however undersensing is noted. Per [name]'s office visit note on 10/5: Remote transmission in 3 weeks to determine if AF still present, if so then will proceed with cardioversion.  Dr. Velora Heckler, Mr. [facility] primary cardiologist, has been consulted and agrees with plan/cardioversion. CHADS-VASc 3 - Eliquis initiated for stroke prophylaxis without bleeding concerns. Of note, Most recent R wave measurement was 25mV, sensitivity is programmed at 38mV. 1.4:1 safety margin.    Please see Media for scanned documents on complete device information.

## 2020-10-09 NOTE — Telephone Encounter (Signed)
Noted.  Will have RN team reach out to Mr. Reh to ensure no missed doses of anticoagulation and scheduling of cardioversion with Dr. Velora Heckler.

## 2020-10-09 NOTE — Telephone Encounter (Signed)
I called the patient and reviewed Stephen Eaton message.  He is taking Eliquis 5 mg BID as prescribed. Missed one dose the first week, but no missed doses since. On anticoagulation for about 3 weeks.     He is overall asymptomatic. 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 Denies SOB, racing heart rate, dizziness, SOB.     I discussed the process of arranging Cardioversion: Dr. Velora Heckler will need to put in orders, [facility] will call him to schedule procedure, RN will call with med instructions. Call back cardiology clinic if you don't get a phone call in 1 week.     Routing to Dr. Velora Heckler.

## 2020-10-10 ENCOUNTER — Telehealth (INDEPENDENT_AMBULATORY_CARE_PROVIDER_SITE_OTHER): Payer: Self-pay | Admitting: Nurse Practitioner

## 2020-10-10 DIAGNOSIS — I4891 Unspecified atrial fibrillation: Secondary | ICD-10-CM

## 2020-10-10 DIAGNOSIS — K222 Esophageal obstruction: Secondary | ICD-10-CM | POA: Diagnosis not present

## 2020-10-10 DIAGNOSIS — K2210 Ulcer of esophagus without bleeding: Secondary | ICD-10-CM | POA: Diagnosis not present

## 2020-10-10 DIAGNOSIS — R112 Nausea with vomiting, unspecified: Secondary | ICD-10-CM | POA: Diagnosis not present

## 2020-10-10 DIAGNOSIS — K209 Esophagitis, unspecified without bleeding: Secondary | ICD-10-CM | POA: Diagnosis not present

## 2020-10-10 DIAGNOSIS — R933 Abnormal findings on diagnostic imaging of other parts of digestive tract: Secondary | ICD-10-CM | POA: Diagnosis not present

## 2020-10-10 MED ORDER — METOPROLOL SUCCINATE ER 50 MG OR TB24
75.0000 mg | EXTENDED_RELEASE_TABLET | Freq: Two times a day (BID) | ORAL | 3 refills | Status: AC
Start: 2020-10-10 — End: ?

## 2020-10-10 NOTE — Telephone Encounter (Signed)
Patient calling regarding his Metoprolol succinate Rx. Patient is currently taking an increased amount as recommended by Tora Perches since 09/18/20 appointment.    States he requested a refill three weeks ago to match the current amount but his pharmacy has not filled it. Requesting assistance as he currently has four days left.    Current Rx Metoprolol succinate 50mg , 1.5 tabs, two times daily.    [phone]

## 2020-10-10 NOTE — Telephone Encounter (Signed)
Pt last saw Shirley Muscat, ARNP on 09/18/2020.     Metoprolol succinate ER 50 mg twice a day, increased 1.5 tablets to 75 mg twice a day.    I reviewed the last progress note and confirmed the medication name, dose, route, and frequency. Prescription is made out to reflect that information.    Called patient informed metoprolol prescription sent to pharmacy.

## 2020-10-11 ENCOUNTER — Other Ambulatory Visit (INDEPENDENT_AMBULATORY_CARE_PROVIDER_SITE_OTHER): Payer: Self-pay | Admitting: Interventional Cardiology

## 2020-10-12 ENCOUNTER — Other Ambulatory Visit (INDEPENDENT_AMBULATORY_CARE_PROVIDER_SITE_OTHER): Payer: Self-pay | Admitting: Interventional Cardiology

## 2020-10-12 ENCOUNTER — Encounter (HOSPITAL_COMMUNITY): Payer: Self-pay | Admitting: Interventional Cardiology

## 2020-10-12 DIAGNOSIS — I480 Paroxysmal atrial fibrillation: Secondary | ICD-10-CM

## 2020-10-12 NOTE — Progress Notes (Signed)
c 

## 2020-10-12 NOTE — Telephone Encounter (Signed)
Per chart review: cardioversion order is now in.

## 2020-10-15 ENCOUNTER — Other Ambulatory Visit (HOSPITAL_BASED_OUTPATIENT_CLINIC_OR_DEPARTMENT_OTHER): Payer: Self-pay | Admitting: Family Medicine

## 2020-10-15 ENCOUNTER — Telehealth (INDEPENDENT_AMBULATORY_CARE_PROVIDER_SITE_OTHER): Payer: Self-pay | Admitting: Interventional Cardiology

## 2020-10-15 DIAGNOSIS — Z20822 Contact with and (suspected) exposure to covid-19: Secondary | ICD-10-CM

## 2020-10-15 DIAGNOSIS — Z01812 Encounter for preprocedural laboratory examination: Secondary | ICD-10-CM

## 2020-10-15 NOTE — Telephone Encounter (Signed)
Pre procedure instructions: Cardioversion scheduled for 11/5 with Dr Vertis Kelch    COVID test: scheduled for 11/3 at 10:15 AM at [facility]    Procedure: Cardioversion scheduled for 11/5 with Dr Vertis Kelch              Check in time: 7:00 AM               Patient has transportation with   Please be NPO, do not eat or drink after midnight on 11/4     Medication instructions:    Hold Metformin 1,000 mg AM of procedure     Please take your other medications as prescribed: you may do so with small sips of water    Pending medications for Dr Vertis Kelch approval     Routed to Dr Vertis Kelch

## 2020-10-16 NOTE — Telephone Encounter (Signed)
Reviewed medications with Dr Vertis Kelch no need to hold any medications can take metformin AM of procedure.  Patient can hold hydrochlorothiazide for patient comfort    Called patient left detailed message with pre procedure instructions.  Please make arrangement to have someone provide transportation to and from procedure.  Please call [phone] if any questions.

## 2020-10-17 ENCOUNTER — Ambulatory Visit: Payer: Medicare PPO | Attending: Family Medicine

## 2020-10-17 ENCOUNTER — Other Ambulatory Visit: Payer: Self-pay

## 2020-10-17 DIAGNOSIS — Z20822 Contact with and (suspected) exposure to covid-19: Secondary | ICD-10-CM

## 2020-10-17 DIAGNOSIS — Z01812 Encounter for preprocedural laboratory examination: Secondary | ICD-10-CM | POA: Insufficient documentation

## 2020-10-17 LAB — COVID-19 CORONAVIRUS QUALITATIVE PCR: COVID-19 Coronavirus Qual PCR Result: NOT DETECTED

## 2020-10-17 NOTE — Progress Notes (Signed)
Patient was seen on 10/17/2020 at the [facility] COVID 19 TEST SITE drive up site where a sample of Anterior Nares collection was taken. The specimen was sent to the [facility] lab for COVID-19 testing.  Patient will be informed of test results within 48 hours.  Patient received informational instructions on self-care.    The specimen was collected by: Kathleen Malone, RN

## 2020-10-17 NOTE — Telephone Encounter (Signed)
Routed to J.Branham and J.Mercure in routing history. Both opened encounter. Now closing.

## 2020-10-17 NOTE — Patient Instructions (Signed)
Evaluation for COVID-19  Testing, Result Information, Symptom Management    Who is being tested for COVID-19?  [facility] is testing patients for COVID-19 for:  1. Patients who have symptoms that may be related to COVID-19  2. Patients who were exposed to COVID-19  3. Patients who require testing for travel or to return to work  4. Patients who do not have symptoms, but have an upcoming surgery or procedure that requires routine COVID-19 testing beforehand    If a COVID-19 test was ordered, what number do I call to set up a swabbing appointment?  You may call the [facility] COVID-19 Line at [phone].    If you are experiencing symptoms, what do we believe you have?  You have a viral syndrome, which may include symptoms like muscle aches, fevers, chills, runny nose, cough, sneezing, sore throat, vomiting or diarrhea.     SARS-CoV-2, the virus that causes COVID-19, is one of the potential viruses you may have. You may be just as likely to have a different viral infection such as the common cold or flu.    Most patients with COVID-19 have mild symptoms and recover on their own. Resting, staying hydrated, and sleeping are typically helpful. As of today's visit, you are well enough to go home and treat your symptoms with oral fluids and medicines for fevers, cough, pain, etc. If your symptoms worsen, you should seek additional medical care.    Why is COVID-19 testing being performed before my surgery/procedure?  The safety of our patients and staff is our top priority. We are performing COVID-19 testing before certain surgeries and procedures to maintain everyone's safety and help prevent others from getting infected or exposed.    When will I receive results for my COVID-19 test?  If COVID-19 testing is performed, the results should be available in 1-2 days. You may find testing follow-up instructions here: [email or web address]    Who can I contact for questions?  Call  [phone] for any COVID-19 questions or if your symptoms are worsening. Please allow 48 hours for results to finalize before contacting us about your result status.    How do I receive results?  Please do not contact the Emergency Department or clinic for results of this test. Please wait to be contacted as outlined below and do not go to your doctor's office for results.    IF THE RESULT IS POSITIVE OR INCONCLUSIVE  A member of the [facility] team will call you for further discussion. You may also view your result in eCare or through a QR code you may receive at your testing site.    IF THE RESULT IS NEGATIVE  You will receive this information by phone, via eCare, or a QR code you may receive at your testing site.    Pre-surgical evaluations  A member of your surgery team will review your results and contact you if needed. Please remain isolated until your surgery date to reduce the risk of COVID-19 exposure.    eCare  If you are a [facility] patient, eCare ([email or web address]) is the fastest way to receive your results. Results will be released to eCare within one hour of being posted in our system, and you may receive your results before we are able to contact you.    QR Code  You may receive a QR code label at the time of your test. If you do not have an eCare account, you may use the QR code label to view   your results at [email or web address]. You will not receive a notification when your result is ready to view on this site, but you can visit the site as many times as you wish to check the result status.    What do I do while I wait for my test results?   Stay home except to get medical care. Do not return to work or your regular activities outside of home. Remain isolated until you receive your results.    After receiving your results, follow the instructions here: [email or web address]    Please follow the precautions below:   Stay home except to  get medical care.     Do not go to work, school, or public areas. Avoid using public transportation, ride-sharing, or taxis.   Separate yourself from other people in your home as much as possible.   Stay in a specific room and away from other people in your home as much as possible. Use a separate bathroom if possible.   Do not share household items with other people in your home.   This includes sharing dishes, drinking glasses, cups, eating utensils, towels or bedding. After using these items, they should be washed thoroughly with soap and water.   Clean all "high-touch" surfaces regularly.   This includes counters, tabletops, doorknobs, bathroom fixtures, toilets, phones, keyboards, tablets and bedside tables. Also, clean any surfaces that may have blood, stool or body fluids on them. Use a household cleaning spray or wipe, according to the label instructions.   Cover your coughs and sneezes with a tissue, mask or the inside of your elbow.   Throw used tissues in a lined trash can; immediately wash your hands with soap and water for at least 20 seconds or clean your hands with an alcohol-based hand sanitizer that contains at least 60% alcohol. Soap and water should be used if hands are visibly dirty.   When seeking care at a healthcare facility:   Seek prompt medical attention if your illness is worsening (e.g., difficulty breathing).   When possible, call the healthcare provider before arriving.   Put on a facemask before you enter the facility.   If possible, put on a facemask before the ambulance or paramedics arrive.   These steps will help the healthcare provider's office prevent other people from getting infected or exposed.      Please see the resources below for more information  Information Lines  [place] [facility] of [facility] COVID-19 Call Center: [phone]   [facility] COVID-19 Line: [phone]    [facility] Websites  COVID-19  Information  [email or web address]    [place] Department of Health Websites  General Facts on COVID-19  doh.[place].gov/Emergencies/NovelCoronavirusOutbreak2020/FactSheet    What to do if you were potentially exposed to someone with confirmed coronavirus disease (COVID-19)  doh.[place].gov/Portals/1/Documents/1600/coronavirus/COVIDexposed.pdf    Centers for Disease Control and Prevention (CDC) Websites  COVID-19 FAQs:  cdc.gov/coronavirus/2019-ncov/faq.html    What to do if you are sick: cdc.gov/coronavirus/2019-ncov/if-you-are-sick/steps-when-sick.html

## 2020-10-18 DIAGNOSIS — K222 Esophageal obstruction: Secondary | ICD-10-CM | POA: Diagnosis not present

## 2020-10-19 ENCOUNTER — Inpatient Hospital Stay
Admission: RE | Admit: 2020-10-19 | Discharge: 2020-10-19 | Disposition: A | Discharge status: Home / Self Care | Payer: Medicare PPO | Attending: Interventional Cardiology | Admitting: Interventional Cardiology

## 2020-10-19 ENCOUNTER — Inpatient Hospital Stay (HOSPITAL_COMMUNITY): Payer: Medicare PPO | Admitting: Anesthesiology

## 2020-10-19 ENCOUNTER — Other Ambulatory Visit: Payer: Self-pay

## 2020-10-19 ENCOUNTER — Encounter (HOSPITAL_COMMUNITY): Payer: Self-pay

## 2020-10-19 ENCOUNTER — Encounter (HOSPITAL_BASED_OUTPATIENT_CLINIC_OR_DEPARTMENT_OTHER): Payer: Medicare PPO | Admitting: Internal Med

## 2020-10-19 ENCOUNTER — Inpatient Hospital Stay (HOSPITAL_BASED_OUTPATIENT_CLINIC_OR_DEPARTMENT_OTHER): Payer: Medicare PPO | Admitting: Anesthesiology

## 2020-10-19 DIAGNOSIS — I480 Paroxysmal atrial fibrillation: Secondary | ICD-10-CM

## 2020-10-19 DIAGNOSIS — I4510 Unspecified right bundle-branch block: Secondary | ICD-10-CM

## 2020-10-19 DIAGNOSIS — I482 Chronic atrial fibrillation, unspecified: Secondary | ICD-10-CM

## 2020-10-19 DIAGNOSIS — R9431 Abnormal electrocardiogram [ECG] [EKG]: Secondary | ICD-10-CM

## 2020-10-19 LAB — BASIC METABOLIC PANEL
Anion Gap: 8 (ref 4–12)
Calcium: 9.4 mg/dL (ref 8.9–10.2)
Carbon Dioxide, Total: 28 meq/L (ref 22–32)
Chloride: 105 meq/L (ref 98–108)
Creatinine: 0.88 mg/dL (ref 0.51–1.18)
Glucose: 132 mg/dL — ABNORMAL HIGH (ref 62–125)
Potassium: 3.6 meq/L (ref 3.6–5.2)
Sodium: 141 meq/L (ref 135–145)
Urea Nitrogen: 22 mg/dL — ABNORMAL HIGH (ref 8–21)
eGFR by CKD-EPI: >60 mL/min/{1.73_m2} (ref >59)

## 2020-10-19 LAB — EKG 12 LEAD
Atrial Rate: 242 {beats}/min
P Axis: 253 degrees
Q-T Interval: 398 ms
QRS Duration: 146 ms
QTC Calculation: 545 ms
R Axis: 270 degrees
T Axis: 34 degrees
Ventricular Rate: 113 {beats}/min

## 2020-10-19 MED ORDER — FLUMAZENIL 0.5 MG/5ML IV SOLN
0.2000 mg | INTRAVENOUS | Status: DC | PRN
Start: 2020-10-19 — End: 2020-10-19

## 2020-10-19 MED ORDER — SODIUM CHLORIDE 0.9 % IV SOLN
INTRAVENOUS | Status: DC | PRN
Start: 2020-10-19 — End: 2020-10-19

## 2020-10-19 MED ORDER — ACETAMINOPHEN 325 MG OR TABS
325.0000 mg | ORAL_TABLET | ORAL | Status: DC | PRN
Start: 2020-10-19 — End: 2020-10-19

## 2020-10-19 MED ORDER — SODIUM CHLORIDE 0.9 % IV SOLN
3.0000 mL/h | INTRAVENOUS | Status: DC
Start: 2020-10-19 — End: 2020-10-19

## 2020-10-19 MED ORDER — PROPOFOL 10 MG/ML IV EMUL WRAPPER (OSM ONLY)
INTRAVENOUS | Status: DC | PRN
Start: 2020-10-19 — End: 2020-10-19
  Administered 2020-10-19: 80 mg via INTRAVENOUS

## 2020-10-19 MED ORDER — LIDOCAINE HCL PF 2% IV/IJ SOSY/SOLN WRAPPER (ANESTHESIA OSM ONLY)
INTRAVENOUS | Status: DC | PRN
Start: 2020-10-19 — End: 2020-10-19
  Administered 2020-10-19: 50 mg via INTRAVENOUS

## 2020-10-19 MED ORDER — NALOXONE HCL 0.4 MG/ML IJ SOLN
0.0400 mg | INTRAMUSCULAR | Status: DC | PRN
Start: 2020-10-19 — End: 2020-10-19

## 2020-10-19 MED ORDER — OXYCODONE HCL 5 MG OR TABS
5.0000 mg | ORAL_TABLET | ORAL | Status: DC | PRN
Start: 2020-10-19 — End: 2020-10-19

## 2020-10-19 NOTE — Nursing Note (Signed)
Pt ambulated in hallway, VSS.  No distress noted.  No c/o voiced.  No bleeding or hematoma noted.  Pt was given post CV discharge/ AVS instructions and verbalized understanding.  IV site was discontinued with catheter intact.  Pt was discharged home with wife for transportation home with all personal belongings at 1030.

## 2020-10-19 NOTE — H&P (Signed)
Moderate Sedation Pre-Procedure Assessment/H&P    Vitals:   Vitals (Most recent in last 24 hrs)     T: 36.6 C (10/19/20 0701)  BP: 125/70 (10/19/20 0900)  HR: (!) 102 (10/19/20 0900)  RR: 16 (10/19/20 0900)  SpO2: 96 % (10/19/20 0900)        T range: Temp  Min: 36.6 C  Max: 36.6 C  Admit weight: (!) 106.6 kg (235 lb) (10/19/20 0701)  Last weight: (!) 106.6 kg (235 lb) (10/19/20 0701)     Procedure  EXTERNAL CARDIOVERSION [CAR30]    Sedation RN Data     Patient Language English   @[email or web address] @  Home Medications   Prescriptions   acetaminophen 325 MG tablet   Sig: Take 1-2 tablets (325-650 mg) by mouth every 4 hours as needed for mild pain, moderate pain or severe pain.   apixaban (Eliquis) 5 MG tablet   Sig: Take 1 tablet (5 mg) by mouth 2 times a day.   atorvastatin 10 MG tablet   Sig: Take 10 mg by mouth daily.    fluticasone propionate 50 MCG/ACT nasal spray   Sig: Spray 2 sprays into each nostril daily as needed for allergies.   hydroCHLOROthiazide 25 MG tablet   Sig: Take 25 mg by mouth daily.    losartan 100 MG tablet   Sig: Take 1 tablet (100 mg) by mouth daily.   metFORMIN 1000 MG tablet   Sig: Take 1,000 mg by mouth 2 times a day with meals.    metoprolol succinate ER 50 MG 24 hr tablet   Sig: Take 1.5 tablets (75 mg) by mouth 2 times a day. Do not chew or crush.   prazosin 1 MG capsule   Sig: Take 1 mg by mouth 2 times a day.       Additional Comments: None       Inpatient Medications   SCHEDULED MEDICATIONS:       INFUSED MEDICATIONS:       PRN MEDICATIONS:  .  flumazenil, 0.2 mg, q1 min PRN  .  naloxone, 0.04 mg, q3 min PRN       Anticoagulants   Is patient on anticoagulation meds?: Yes  Anticoagulation labs and last medication admin verified?: Yes     OB/GYN   Patient Pregnant: N/A   Lactation Status: N/A     NPO Status   Date of Last Liquid: 10/19/20  Time of Last Liquid: 0515  Date of Last Solid: 10/18/20  Time of Last Solid: 2200     Activity Tolerance   Assessment of climbing up 2 flights of  stairs: Able to climb withOUT difficulty  Assessment of walking 2 blocks: Able to walk withOUT difficulty     Escort/Ride Home Info   Escort/Ride home contact name: Kohler Pellerito  Escort/Ride home contact relationship: spouse  Escort/Ride home contact number(s): [phone]  Escort/Ride home contact location: at hospital     Sedation Provider Statement  I concur with the RN's pre-procedure assessment above.    Physical Exam:    Airway  Mallampati::  II    Cardiovascular  Rhythm::  Regular    Dental  Normal exam      Pulmonary  Normal exam      Neurological  Normal exam      Other Findings      Risk Factors  Status: Low:  ASA II.  Patient with mild systemic disease    Assessment: Okay for sedation    Plan for Sedation: Moderate  sedation.    Attestation  I am a Solicitor (Attending, ARNP, PA-C, [facility] RN) with privileges for moderate sedation.    Assessment Update  Procedure less than 1 hour from above assessment: No changes      Results Review    Lab Results   Component Value Date    WBC 5.91 09/11/2020    HEMOGLOBIN 14.6 09/11/2020    HEMATOCRIT 42 09/11/2020    PLATELET 152 09/11/2020    SODIUM 141 10/19/2020    POTASSIUM 3.6 10/19/2020    BUN 22 (H) 10/19/2020    CREATININE 0.88 10/19/2020    GLUCOSE 132 (H) 10/19/2020    AST 16 09/11/2020    ALT 15 09/11/2020    ALK 47 09/11/2020    ALBUMIN 3.8 09/11/2020       --  Meda Klinefelter, MD, MPH, [facility]  Clinical Assistant Professor of [employer] [facility], [place] [address]  [phone]  [phone]  [phone]  E: [email or web address]

## 2020-10-19 NOTE — Anesthesia Postprocedure Evaluation (Signed)
Patient: Stephen Eaton    Procedure Summary     Date: 10/19/20 Room / Location: [facility] Cardiac Proc [facility]    Anesthesia Start: 0902 Anesthesia Stop: 0914    Procedure: EXTERNAL CARDIOVERSION Diagnosis:       Paroxysmal atrial fibrillation (HCC)      Paroxysmal atrial fibrillation (HCC)    Scheduled Providers: Florestine Avers, MD; Lesia Sago, MD Responsible Provider: Lesia Sago, MD    Anesthesia Type: general ASA Status: 3        Final Anesthesia Type: general    Vitals Value Taken Time   BP 141/95 10/19/20 0903   Temp  10/19/20 0914   Pulse 106 10/19/20 0904   SpO2 98 % 10/19/20 0904   Vitals shown include unvalidated device data.    Place of evaluation: other    Patient participation: patient participated    Level of consciousness: sedated and responsive to voice    Patient pain control satisfaction: patient is satisfied with level of pain control    Airway patency: patent    Cardiovascular status during assessment: stable    Respiratory status during assessment: breathing comfortably    Anesthetic complications: no    Intravascular volume status assessment: euvolemic    Nausea / vomiting: patient is not experiencing nausea      Planned post-operative disposition at time of assessment: hospital discharge

## 2020-10-19 NOTE — Procedure Nursing Note (Signed)
Pt admitted to CPU for cardioversion. Plan IV start, lab draw, admission hx, skin prep and medication reconciliation.

## 2020-10-19 NOTE — Anesthesia Preprocedure Evaluation (Signed)
Patient: Stephen Eaton    Procedure Information     Date/Time: 10/19/20 0700    Scheduled providers: Meda Klinefelter, MD; Thora Lance, MD    Procedure: EXTERNAL CARDIOVERSION    Location: [facility]        HPI: see H&P    Relevant Problems   Cardio   (+) Complete heart block ([place])   (+) Coronary artery disease involving native coronary artery of native heart without angina pectoris   (+) Essential (primary) hypertension   (+) Pacemaker; St Jude   (+) Paroxysmal atrial fibrillation (HCC)      Endo/Immunology   (+) Type 2 diabetes mellitus with diabetic neuropathy, without long-term current use of insulin       Relevant surgical history: History reviewed. No pertinent surgical history.      Medications:     Outpatient:   Current Outpatient Medications   Medication Instructions   . acetaminophen (TYLENOL) 325-650 mg, Oral, Every 4 hours PRN   . apixaban (ELIQUIS) 5 mg, Oral, 2 times daily   . atorvastatin (LIPITOR) 10 mg, Oral, Daily   . fluticasone propionate 50 MCG/ACT nasal spray 2 sprays, Both Nostrils, Daily PRN   . hydroCHLOROthiazide (HYDRODIURIL) 25 mg, Oral, Daily   . losartan (COZAAR) 100 mg, Oral, Daily   . metFORMIN (GLUCOPHAGE) 1,000 mg, Oral, 2 times daily with meals   . metoprolol succinate ER (TOPROL XL) 75 mg, Oral, 2 times daily, Do not chew or crush.   . prazosin (MINIPRESS) 1 mg, Oral, 2 times daily                Review of patient's allergies indicates:  Allergies   Allergen Reactions   . Strawberry C [Ascorbate] Resp: Shortness of Breath   . Strawberry Extract Anaphylaxis       Social History:   Social History     Tobacco Use   . Smoking status: Never Smoker   . Smokeless tobacco: Never Used   Substance Use Topics   . Alcohol use: Yes     Alcohol/week: 1.0 standard drinks     Types: 1 Glasses of wine per week     Frequency: 4 or more times a week     Drinks per session: 1 or 2     Comment: 1 drink/day   . Drug use: Never     Types: Marijuana     Comment: .not in the  last year       Medical History and Review of Systems  Documentation reviewed: patient summary and nursing notes.  Source of information: In person visit.  History of anesthetic complications  (-) History of anesthetic complications.  (-) family history of anesthetic complications.    Functional Status   Able to climb 2 flights of stairs or more without stopping.    Pulmonary   Neg pulmonary ROS    Neuro/Psych   Neg neuro/psych ROS    Cardiovascular   ECG reviewed  (+) pacemaker  (+) hypertension  (+) CAD  (+) dysrhythmias, atrial fibrillation  (-) valvular problems/murmurs    HEENT   Neg HEENT ROS    GI/Hepatic/Renal   neg GI/hepatic/renal ROS    Endo/Immunology   (+) diabetes, type 2    Hematology   negative hematology ROS         Physical Exam  Airway  Mallampati:  II  TM distance:  >6 cm  Neck ROM:  Full    Dental  Cardiovascular  Rhythm:  Irregular  Rate:  Abnormal    Pulmonary  normal             Labs: (last year)    BMP  CBC/Coags   Na 141 10/19/2020  Hb 14.6 09/11/2020   K 3.6 10/19/2020  HCT 42 09/11/2020   Cl 105 10/19/2020  WBC 5.91 09/11/2020   HCO3 28 10/19/2020  PLT 152 09/11/2020   BUN 22 (H) 10/19/2020  INR - -   Cr 0.88 10/19/2020  PT - -   Glu 132 (H) 10/19/2020  PTT - -       Misc   eGFR >60 10/19/2020  MCV 91 09/11/2020   A1C - -  BNP 342 (H) 06/13/2020       LFTs   AST 16 09/11/2020  Albumin 3.8 09/11/2020   ALT 15 09/11/2020  Protein 5.7 (L) 09/11/2020   Alk Phos 47 09/11/2020  T Bili 0.8 09/11/2020         Relevant procedures / diagnostic studies: none    Anesthesia Plan    PAT Discussion      Supervising Provider - Day of Procedure  ASA 3     Planned Anesthetic Type: general    Anesthetic plan and risks discussed with patient.          Risk Calculators / Scores:   Obstructive Sleep Apnea (OSA): High Risk            (!)  Patient snores loudly    (!)  Patient has high blood pressure or is being treated for high blood pressure    (!)  Patient BMI is greater than 35 kg/m2    (!)  Patient is over 75 years old     (!)  Patient's neck circumference is greater than 17 inches (male) or 16 inches (male)    (!)  Patient is male        Criteria that do not apply:    Patient is often tired    Patient has been observed to stop breathing during sleep

## 2020-10-22 ENCOUNTER — Ambulatory Visit: Payer: Medicare Other | Admitting: Neurology

## 2020-10-23 NOTE — Progress Notes (Signed)
PATIENT: Brandon Fleming DOB: 1945/12/30  REASON FOR VISIT: follow up HISTORY FROM: patient  HISTORY OF PRESENT ILLNESS: Today 10/24/20 Brandon Fleming is a 74 year old male with history of alcohol overuse, mild memory disturbance, gait disturbance.  Could not tolerate Aricept secondary to diarrhea.  He is on Namenda XR, Prozac, and Remeron.  Has chronic back and leg pain, sees Dr. Arnoldo Morale, ESI was too painful to tolerate. Weight about the same, down 3 lbs from May. New issue in last 6 weeks, dysphagia, nausea, seeing Dr. Paulita Fujita at [facility], had endoscopy, concerning area, waiting for pathology.  Likely needs back surgery, legs ache all night, but figuring out GI issue first.  His mood and depression does well when he takes medications.  Sometimes at night, just will not take them when his wife gives them.  Continues with a few bourbon beverages daily.  No longer riding his stationary bike, causing back pain.  Going back and forth to [place], where their only daughter lives, have 2 grand children (2,5), goes [employer] Football games.  Drives a car m-f 8-6 court ordered from prior DWI.  Memory is stable, trouble with short-term memory, asked his wife repetitive questions, " what are we doing today?".  No falls.  Presents today for evaluation accompanied by his wife.  HISTORY  04/19/2020 SS: Brandon Fleming is a 74 year old male with history of alcohol overuse and mild memory disturbance and gait disturbance.  He was unable to tolerate Aricept secondary to diarrhea.  He remains on Namenda.  He is taking Remeron for sleep.  He does not eat much during the day, but has about 3-4 bourbon beverages a day, he eats a good dinner, likes ice cream before bed.  Memory is overall stable, has trouble with short-term, will ask his wife repetitive questions (what are we doing today?).  Long-term memory is good.  He has chronic back and leg pain, didn't have the planned injection because procedure was too painful.  The  medications seem to agree with him, as long as he takes them.  His wife reminds him.  If he does not take the Remeron, he may be frustrated, up and down all night.  He has not had any falls, has had a few stumbles.  He drives a car without difficulty and does his own ADLs.  He uses a recumbent bike about 45 minutes a day in the morning, while watching the news.  He is back and forth to [place] on the weekends, where his daughter lives.  He presents today for evaluation accompanied by his wife  REVIEW OF SYSTEMS: Out of a complete 14 system review of symptoms, the patient complains only of the following symptoms, and all other reviewed systems are negative.  Back pain, memory loss  ALLERGIES: Allergies  Allergen Reactions  . Aricept [Donepezil Hcl] Diarrhea and Other (See Comments)    Diarrhea, stomach problems.   . Atropine Swelling and Other (See Comments)    Eyes swollen and red    HOME MEDICATIONS: Outpatient Medications Prior to Visit  Medication Sig Dispense Refill  . amLODipine-valsartan (EXFORGE) 10-160 MG tablet Take 1 tablet by mouth daily.    [name] Kitchen aspirin EC 81 MG tablet Take 81 mg by mouth daily.    . fenofibrate (TRICOR) 145 MG tablet Take 145 mg by mouth daily.    [name] Kitchen FLUoxetine (PROZAC) 20 MG capsule Take 1 capsule (20 mg total) by mouth daily. 90 capsule 3  . folic acid (FOLVITE)  1 MG tablet Take 1 mg by mouth daily.    [name] Kitchen levothyroxine (SYNTHROID) 50 MCG tablet Take 50 mcg by mouth daily.    . memantine (NAMENDA XR) 28 MG CP24 24 hr capsule Take 1 capsule (28 mg total) by mouth daily. 90 capsule 3  . mirtazapine (REMERON) 30 MG tablet Take 1 tablet (30 mg total) by mouth at bedtime. 90 tablet 3  . ondansetron (ZOFRAN-ODT) 4 MG disintegrating tablet Take by mouth.    . pantoprazole (PROTONIX) 40 MG tablet Take 40 mg by mouth daily.    . rosuvastatin (CRESTOR) 10 MG tablet Take 10 mg by mouth daily.     [name] Kitchen thiamine 100 MG tablet Take 100 mg by mouth daily.    . traMADol  (ULTRAM) 50 MG tablet Take by mouth every 6 (six) hours as needed.    [name] Kitchen amLODipine (NORVASC) 5 MG tablet Take 5 mg by mouth daily.     . cyclobenzaprine (FLEXERIL) 10 MG tablet Take 10 mg by mouth 3 (three) times daily as needed for muscle spasms.     No facility-administered medications prior to visit.    PAST MEDICAL HISTORY: Past Medical History:  Diagnosis Date  . Alcohol abuse   . Gait abnormality 04/08/2018  . Hyperlipidemia   . Hypertension   . Kidney damage   . Memory difficulties 04/08/2018    PAST SURGICAL HISTORY: Past Surgical History:  Procedure Laterality Date  . BACK SURGERY    . COLONOSCOPY     no polyps  . COLONOSCOPY W/ POLYPECTOMY    . KNEE SURGERY Left 2011   fracture  . LASER PHOTO ABLATION Left 05/18/2018   Procedure: LASER PHOTO ABLATION;  Surgeon: Hayden Pedro, MD;  Location: [facility];  Service: Ophthalmology;  Laterality: Left;  . PARS PLANA VITRECTOMY Left 05/18/2018   Procedure: PARS PLANA VITRECTOMY WITH 25G REMOVAL/SUTURE INTRAOCULAR LENS;  Surgeon: Hayden Pedro, MD;  Location: [facility];  Service: Ophthalmology;  Laterality: Left;  [name] Kitchen VITRECTOMY Left 05/18/2018   PARS PLANA VITRECTOMY WITH 25G REMOVAL/SUTURE INTRAOCULAR LENS (Left)    FAMILY HISTORY: Family History  Problem Relation Age of Onset  . CAD Father   . Kidney disease Father   . Parkinson's disease Mother   . Anesthesia problems Neg Hx   . Broken bones Neg Hx   . Cancer Neg Hx   . Clotting disorder Neg Hx   . Collagen disease Neg Hx   . Diabetes Neg Hx   . Dislocations Neg Hx   . Osteoporosis Neg Hx   . Rheumatologic disease Neg Hx   . Scoliosis Neg Hx   . Severe sprains Neg Hx     SOCIAL HISTORY: Social History   Socioeconomic History  . Marital status: Married    Spouse name: Not on file  . Number of children: 1  . Years of education: 5  . Highest education level: Master's degree (e.g., MA, MS, MEng, MEd, MSW, MBA)  Occupational History  . Occupation: Retired   Tobacco Use  . Smoking status: Former Smoker    Types: Cigarettes    Quit date: 1978    Years since quitting: 43.8  . Smokeless tobacco: Never Used  Vaping Use  . Vaping Use: Never used  Substance and Sexual Activity  . Alcohol use: Yes    Comment: 8-10 ounces daily  . Drug use: No  . Sexual activity: Not on file  Other Topics Concern  . Not on file  Social History Narrative  Lives at home with his wife.   Right-handed.   Caffeine- minimal use (4 ounces tea/soda per day)   Social Determinants of Health   Financial Resource Strain:   . Difficulty of Paying Living Expenses: Not on file  Food Insecurity:   . Worried About Charity fundraiser in the Last Year: Not on file  . Ran Out of Food in the Last Year: Not on file  Transportation Needs:   . Lack of Transportation (Medical): Not on file  . Lack of Transportation (Non-Medical): Not on file  Physical Activity:   . Days of Exercise per Week: Not on file  . Minutes of Exercise per Session: Not on file  Stress:   . Feeling of Stress : Not on file  Social Connections:   . Frequency of Communication with Friends and Family: Not on file  . Frequency of Social Gatherings with Friends and Family: Not on file  . Attends Religious Services: Not on file  . Active Member of Clubs or Organizations: Not on file  . Attends Archivist Meetings: Not on file  . Marital Status: Not on file  Intimate Partner Violence:   . Fear of Current or Ex-Partner: Not on file  . Emotionally Abused: Not on file  . Physically Abused: Not on file  . Sexually Abused: Not on file   PHYSICAL EXAM  Vitals:   10/24/20 0832  BP: 124/82  Pulse: 79  Weight: 162 lb (73.5 kg)  Height: 5\' 6"  (1.676 m)   Body mass index is 26.15 kg/m.  Generalized: Well developed, in no acute distress  MMSE - Mini Mental State Exam 10/24/2020 10/24/2020 04/19/2020  Orientation to time 5 5 5   Orientation to Place 5 5 5   Registration 3 3 3   Attention/  Calculation 4 - 5  Recall 3 - 3  Language- name 2 objects 2 - 2  Language- repeat 1 - 1  Language- follow 3 step command 3 - 2  Language- read & follow direction 1 - 1  Write a sentence 1 - 1  Copy design 1 - 1  Copy design-comments - - -  Total score 29 - 29    Neurological examination  Mentation: Alert oriented to time, place, history taking. Follows all commands speech and language fluent Cranial nerve II-XII: Pupils were equal round reactive to light. Extraocular movements were full, visual field were full on confrontational test. Facial sensation and strength were normal. Head turning and shoulder shrug  were normal and symmetric. Motor: The motor testing reveals 5 over 5 strength of all 4 extremities. Good symmetric motor tone is noted throughout.  Sensory: Sensory testing is intact to soft touch on all 4 extremities. No evidence of extinction is noted.  Coordination: Cerebellar testing reveals good finger-nose-finger and heel-to-shin bilaterally.  Gait and station: Gait is slightly wide-based, steady Reflexes: Deep tendon reflexes are symmetric and normal bilaterally.   DIAGNOSTIC DATA (LABS, IMAGING, TESTING) - I reviewed patient records, labs, notes, testing and imaging myself where available.  Lab Results  Component Value Date   WBC 6.4 05/06/2018   HGB 9.8 (L) 05/06/2018   HCT 29.5 (L) 05/06/2018   MCV 96.7 05/06/2018   PLT 192 05/06/2018      Component Value Date/Time   NA 141 05/06/2018 0624   K 3.4 (L) 05/06/2018 0624   CL 107 05/06/2018 0624   CO2 25 05/06/2018 0624   GLUCOSE 122 (H) 05/06/2018 0624   BUN 17 05/06/2018 6269  CREATININE 1.18 05/06/2018 0624   CALCIUM 8.0 (L) 05/06/2018 0624   PROT 7.0 04/23/2018 1117   ALBUMIN 4.1 04/23/2018 1117   AST 47 (H) 04/23/2018 1117   ALT 26 04/23/2018 1117   ALKPHOS 48 04/23/2018 1117   BILITOT 0.6 04/23/2018 1117   GFRNONAA >60 05/06/2018 0624   GFRAA >60 05/06/2018 0624   No results found for: CHOL, HDL,  LDLCALC, LDLDIRECT, TRIG, CHOLHDL No results found for: HGBA1C Lab Results  Component Value Date   VITAMINB12 335 02/26/2018   Lab Results  Component Value Date   TSH 4.465 02/26/2018   ASSESSMENT AND PLAN 74 y.o. year old male  has a past medical history of Alcohol abuse, Gait abnormality (04/08/2018), Hyperlipidemia, Hypertension, Kidney damage, and Memory difficulties (04/08/2018). here with:  1.  Mild memory disturbance 2.  Gait disturbance 3.  History of alcohol overuse 4.  Fatigue, insomnia  -MMSE 29/30 today, overall stable -Continue Remeron, Prozac, Namenda XR -Dealing with GI issues, endoscopy has shown concerning area, pending pathology, seeing [facility], keep [place] updated -May need back surgery with Dr. Arnoldo Morale, once GI issues addressed -Depression, mood doing well when taking medications -Follow-up in 6 months or sooner if needed  I spent 30 minutes of face-to-face and non-face-to-face time with patient.  This included previsit chart review, lab review, study review, order entry, electronic health record documentation, patient education.  Butler Denmark, AGNP-C, DNP 10/24/2020, 8:42 AM [facility] [address] [place], [place] [address] [phone]

## 2020-10-24 ENCOUNTER — Encounter: Payer: Self-pay | Admitting: Neurology

## 2020-10-24 ENCOUNTER — Ambulatory Visit (INDEPENDENT_AMBULATORY_CARE_PROVIDER_SITE_OTHER): Payer: Medicare Other | Admitting: Neurology

## 2020-10-24 ENCOUNTER — Other Ambulatory Visit: Payer: Self-pay | Admitting: Gastroenterology

## 2020-10-24 ENCOUNTER — Other Ambulatory Visit: Payer: Self-pay

## 2020-10-24 ENCOUNTER — Encounter (HOSPITAL_BASED_OUTPATIENT_CLINIC_OR_DEPARTMENT_OTHER): Payer: Self-pay

## 2020-10-24 VITALS — BP 124/82 | HR 79 | Ht 66.0 in | Wt 162.0 lb

## 2020-10-24 DIAGNOSIS — R1310 Dysphagia, unspecified: Secondary | ICD-10-CM

## 2020-10-24 DIAGNOSIS — R413 Other amnesia: Secondary | ICD-10-CM | POA: Diagnosis not present

## 2020-10-24 DIAGNOSIS — K222 Esophageal obstruction: Secondary | ICD-10-CM

## 2020-10-24 DIAGNOSIS — R269 Unspecified abnormalities of gait and mobility: Secondary | ICD-10-CM | POA: Diagnosis not present

## 2020-10-24 DIAGNOSIS — G4700 Insomnia, unspecified: Secondary | ICD-10-CM | POA: Diagnosis not present

## 2020-10-24 NOTE — Patient Instructions (Signed)
Memory looks overall stable  Continue current medications  Keep [place] posted on what is going on in the future See you back in 6 months

## 2020-10-24 NOTE — Progress Notes (Signed)
I have read the note, and I agree with the clinical assessment and plan.  Kamerin Grumbine K Anett Ranker   

## 2020-10-31 ENCOUNTER — Encounter (INDEPENDENT_AMBULATORY_CARE_PROVIDER_SITE_OTHER): Payer: Medicare PPO

## 2020-11-06 DIAGNOSIS — Z125 Encounter for screening for malignant neoplasm of prostate: Secondary | ICD-10-CM | POA: Diagnosis not present

## 2020-11-06 DIAGNOSIS — R7301 Impaired fasting glucose: Secondary | ICD-10-CM | POA: Diagnosis not present

## 2020-11-06 DIAGNOSIS — E785 Hyperlipidemia, unspecified: Secondary | ICD-10-CM | POA: Diagnosis not present

## 2020-11-13 DIAGNOSIS — I10 Essential (primary) hypertension: Secondary | ICD-10-CM | POA: Diagnosis not present

## 2020-11-13 DIAGNOSIS — R82998 Other abnormal findings in urine: Secondary | ICD-10-CM | POA: Diagnosis not present

## 2020-11-14 ENCOUNTER — Ambulatory Visit
Admission: RE | Admit: 2020-11-14 | Discharge: 2020-11-14 | Disposition: A | Discharge status: Home / Self Care | Payer: Medicare Other | Source: Ambulatory Visit | Attending: Gastroenterology | Admitting: Gastroenterology

## 2020-11-14 ENCOUNTER — Other Ambulatory Visit: Payer: Self-pay

## 2020-11-14 DIAGNOSIS — I700 Atherosclerosis of aorta: Secondary | ICD-10-CM | POA: Diagnosis not present

## 2020-11-14 DIAGNOSIS — K7290 Hepatic failure, unspecified without coma: Secondary | ICD-10-CM | POA: Diagnosis not present

## 2020-11-14 DIAGNOSIS — R1310 Dysphagia, unspecified: Secondary | ICD-10-CM | POA: Diagnosis not present

## 2020-11-14 DIAGNOSIS — K222 Esophageal obstruction: Secondary | ICD-10-CM

## 2020-11-14 DIAGNOSIS — I2510 Atherosclerotic heart disease of native coronary artery without angina pectoris: Secondary | ICD-10-CM | POA: Diagnosis not present

## 2020-11-14 MED ORDER — IOPAMIDOL (ISOVUE-300) INJECTION 61%
75.0000 mL | Freq: Once | INTRAVENOUS | Status: AC | PRN
  Administered 2020-11-14: 75 mL via INTRAVENOUS

## 2020-12-04 ENCOUNTER — Other Ambulatory Visit (INDEPENDENT_AMBULATORY_CARE_PROVIDER_SITE_OTHER): Payer: Medicare PPO

## 2020-12-04 DIAGNOSIS — I442 Atrioventricular block, complete: Secondary | ICD-10-CM

## 2020-12-04 DIAGNOSIS — Z45018 Encounter for adjustment and management of other part of cardiac pacemaker: Secondary | ICD-10-CM

## 2020-12-04 DIAGNOSIS — Z950 Presence of cardiac pacemaker: Secondary | ICD-10-CM

## 2020-12-19 DIAGNOSIS — R1310 Dysphagia, unspecified: Secondary | ICD-10-CM | POA: Diagnosis not present

## 2020-12-19 DIAGNOSIS — K222 Esophageal obstruction: Secondary | ICD-10-CM | POA: Diagnosis not present

## 2020-12-20 ENCOUNTER — Encounter (INDEPENDENT_AMBULATORY_CARE_PROVIDER_SITE_OTHER): Payer: Medicare PPO

## 2021-01-08 DIAGNOSIS — Z961 Presence of intraocular lens: Secondary | ICD-10-CM | POA: Diagnosis not present

## 2021-01-08 DIAGNOSIS — H40022 Open angle with borderline findings, high risk, left eye: Secondary | ICD-10-CM | POA: Diagnosis not present

## 2021-01-08 DIAGNOSIS — H21232 Degeneration of iris (pigmentary), left eye: Secondary | ICD-10-CM | POA: Diagnosis not present

## 2021-01-08 DIAGNOSIS — H5213 Myopia, bilateral: Secondary | ICD-10-CM | POA: Diagnosis not present

## 2021-01-10 DIAGNOSIS — K2289 Other specified disease of esophagus: Secondary | ICD-10-CM | POA: Diagnosis not present

## 2021-01-10 DIAGNOSIS — K200 Eosinophilic esophagitis: Secondary | ICD-10-CM | POA: Diagnosis not present

## 2021-01-10 DIAGNOSIS — K3189 Other diseases of stomach and duodenum: Secondary | ICD-10-CM | POA: Diagnosis not present

## 2021-01-10 DIAGNOSIS — Z86010 Personal history of colonic polyps: Secondary | ICD-10-CM | POA: Diagnosis not present

## 2021-01-10 DIAGNOSIS — K2950 Unspecified chronic gastritis without bleeding: Secondary | ICD-10-CM | POA: Diagnosis not present

## 2021-01-10 DIAGNOSIS — I10 Essential (primary) hypertension: Secondary | ICD-10-CM | POA: Diagnosis not present

## 2021-01-10 DIAGNOSIS — K222 Esophageal obstruction: Secondary | ICD-10-CM | POA: Diagnosis not present

## 2021-01-10 DIAGNOSIS — K2100 Gastro-esophageal reflux disease with esophagitis, without bleeding: Secondary | ICD-10-CM | POA: Diagnosis not present

## 2021-02-26 DIAGNOSIS — H409 Unspecified glaucoma: Secondary | ICD-10-CM | POA: Diagnosis not present

## 2021-02-26 DIAGNOSIS — Z79899 Other long term (current) drug therapy: Secondary | ICD-10-CM | POA: Diagnosis not present

## 2021-02-26 DIAGNOSIS — R1310 Dysphagia, unspecified: Secondary | ICD-10-CM | POA: Diagnosis not present

## 2021-02-26 DIAGNOSIS — K219 Gastro-esophageal reflux disease without esophagitis: Secondary | ICD-10-CM | POA: Diagnosis not present

## 2021-02-26 DIAGNOSIS — I10 Essential (primary) hypertension: Secondary | ICD-10-CM | POA: Diagnosis not present

## 2021-02-26 DIAGNOSIS — K2289 Other specified disease of esophagus: Secondary | ICD-10-CM | POA: Diagnosis not present

## 2021-02-26 DIAGNOSIS — E785 Hyperlipidemia, unspecified: Secondary | ICD-10-CM | POA: Diagnosis not present

## 2021-02-26 DIAGNOSIS — Z86010 Personal history of colonic polyps: Secondary | ICD-10-CM | POA: Diagnosis not present

## 2021-02-26 DIAGNOSIS — K222 Esophageal obstruction: Secondary | ICD-10-CM | POA: Diagnosis not present

## 2021-03-13 ENCOUNTER — Other Ambulatory Visit (INDEPENDENT_AMBULATORY_CARE_PROVIDER_SITE_OTHER): Payer: Medicare PPO

## 2021-03-13 DIAGNOSIS — Z950 Presence of cardiac pacemaker: Secondary | ICD-10-CM

## 2021-03-13 DIAGNOSIS — I442 Atrioventricular block, complete: Secondary | ICD-10-CM

## 2021-03-13 DIAGNOSIS — Z45018 Encounter for adjustment and management of other part of cardiac pacemaker: Secondary | ICD-10-CM

## 2021-03-15 DIAGNOSIS — G8929 Other chronic pain: Secondary | ICD-10-CM | POA: Diagnosis not present

## 2021-03-15 DIAGNOSIS — M5441 Lumbago with sciatica, right side: Secondary | ICD-10-CM | POA: Diagnosis not present

## 2021-03-15 DIAGNOSIS — M5442 Lumbago with sciatica, left side: Secondary | ICD-10-CM | POA: Diagnosis not present

## 2021-04-03 ENCOUNTER — Ambulatory Visit: Payer: Medicare Other | Attending: Internal Medicine

## 2021-04-03 DIAGNOSIS — Z23 Encounter for immunization: Secondary | ICD-10-CM

## 2021-04-03 NOTE — Progress Notes (Signed)
   Covid-19 Vaccination Clinic  Name:  Brandon Fleming    MRN: [id number] DOB: 04-26-1946  04/03/2021  Brandon Fleming was observed post Covid-19 immunization for 15 minutes without incident. He was provided with Vaccine Information Sheet and instruction to access the V-Safe system.   Brandon Fleming was instructed to call 911 with any severe reactions post vaccine: [name] Kitchen Difficulty breathing  . Swelling of face and throat  . A fast heartbeat  . A bad rash all over body  . Dizziness and weakness   Immunizations Administered    Name Date Dose VIS Date Route   PFIZER Comrnaty(Gray TOP) Covid-19 Vaccine 04/03/2021  9:24 AM 0.3 mL 11/22/2020 Intramuscular   Manufacturer: [employer], [employer]   Lot: [id number]   Golden: [id number]

## 2021-04-04 ENCOUNTER — Other Ambulatory Visit (HOSPITAL_COMMUNITY): Payer: Self-pay

## 2021-04-04 MED ORDER — COVID-19 MRNA VAC-TRIS(PFIZER) 30 MCG/0.3ML IM SUSP
INTRAMUSCULAR | 0 refills | Status: DC
Start: 2021-04-03 — End: 2021-04-23
  Filled 2021-04-04: qty 0.3, 17d supply, fill #0

## 2021-04-05 ENCOUNTER — Other Ambulatory Visit (HOSPITAL_COMMUNITY): Payer: Self-pay

## 2021-04-08 DIAGNOSIS — H1033 Unspecified acute conjunctivitis, bilateral: Secondary | ICD-10-CM | POA: Diagnosis not present

## 2021-04-09 ENCOUNTER — Other Ambulatory Visit (HOSPITAL_COMMUNITY): Payer: Self-pay

## 2021-04-15 DIAGNOSIS — H02054 Trichiasis without entropian left upper eyelid: Secondary | ICD-10-CM | POA: Diagnosis not present

## 2021-04-15 DIAGNOSIS — H1033 Unspecified acute conjunctivitis, bilateral: Secondary | ICD-10-CM | POA: Diagnosis not present

## 2021-04-18 ENCOUNTER — Other Ambulatory Visit: Payer: Self-pay | Admitting: Neurology

## 2021-04-23 ENCOUNTER — Encounter: Payer: Self-pay | Admitting: Orthopaedic Surgery

## 2021-04-23 ENCOUNTER — Encounter: Payer: Self-pay | Admitting: Neurology

## 2021-04-23 ENCOUNTER — Ambulatory Visit: Payer: Self-pay

## 2021-04-23 ENCOUNTER — Ambulatory Visit (INDEPENDENT_AMBULATORY_CARE_PROVIDER_SITE_OTHER): Payer: Medicare Other | Admitting: Neurology

## 2021-04-23 ENCOUNTER — Other Ambulatory Visit: Payer: Self-pay

## 2021-04-23 ENCOUNTER — Ambulatory Visit (INDEPENDENT_AMBULATORY_CARE_PROVIDER_SITE_OTHER): Payer: Medicare Other | Admitting: Orthopaedic Surgery

## 2021-04-23 VITALS — Ht 66.0 in | Wt 158.0 lb

## 2021-04-23 VITALS — BP 133/78 | HR 72 | Ht 66.0 in | Wt 158.0 lb

## 2021-04-23 DIAGNOSIS — R413 Other amnesia: Secondary | ICD-10-CM

## 2021-04-23 DIAGNOSIS — R269 Unspecified abnormalities of gait and mobility: Secondary | ICD-10-CM

## 2021-04-23 DIAGNOSIS — G4700 Insomnia, unspecified: Secondary | ICD-10-CM | POA: Diagnosis not present

## 2021-04-23 DIAGNOSIS — M25552 Pain in left hip: Secondary | ICD-10-CM

## 2021-04-23 MED ORDER — BUPIVACAINE HCL 0.25 % IJ SOLN
2.0000 mL | INTRAMUSCULAR | Status: AC | PRN
  Administered 2021-04-23: 2 mL via INTRA_ARTICULAR

## 2021-04-23 MED ORDER — MEMANTINE HCL ER 28 MG PO CP24: 28.0000 mg | ORAL_CAPSULE | Freq: Every day | ORAL | 3 refills | Status: DC

## 2021-04-23 MED ORDER — LIDOCAINE HCL 1 % IJ SOLN
2.0000 mL | INTRAMUSCULAR | Status: AC | PRN
Start: 2021-04-23 — End: 2021-04-23
  Administered 2021-04-23: 2 mL

## 2021-04-23 NOTE — Progress Notes (Signed)
I have read the note, and I agree with the clinical assessment and plan.  Cutter Passey K Dequincy Born   

## 2021-04-23 NOTE — Progress Notes (Signed)
PATIENT: Brandon Fleming DOB: [birth year]  REASON FOR VISIT: follow up HISTORY FROM: patient  HISTORY OF PRESENT ILLNESS: Today 04/23/21 Brandon Fleming is a 75 year old male with history of alcohol overuse, mild memory disturbance, gait disturbance.  Is on Namenda XR, Prozac, Remeron from this office.  Had side effect to Aricept. MMSE 28/30 today. Still dealing with left hip and low back pain, seeing Dr. Durward Fortes, hoping for cortisone shot today in hip. Needs back surgery doesn't want it, seeing Dr. Arnoldo Morale, takting Tramadol. Going to see grand kids in [place], twice monthly helps anxiety and depression.  He is driving restrictions have been lifted.  Has trouble with short-term memory, makes a lot of notes.  Is not especially active, prefers to stay at home, worries about mobility issues, afraid of falling, refuses to use cane.  His wife remains quite social.  No longer rides a stationary bike.  He had his esophagus stretched at [facility], dysphagia has improved.  His medications from this office are working, there are times he does not want to take them.  Weight is down 5 pounds since last seen.  He does eat a good dinner when he takes the mirtazapine.  Here today for evaluation accompanied by his wife.  Update 10/24/2020 SS: Brandon Fleming is a 75 year old male with history of alcohol overuse, mild memory disturbance, gait disturbance.  Could not tolerate Aricept secondary to diarrhea.  He is on Namenda XR, Prozac, and Remeron.  Has chronic back and leg pain, sees Dr. Arnoldo Morale, ESI was too painful to tolerate. Weight about the same, down 3 lbs from May. New issue in last 6 weeks, dysphagia, nausea, seeing Dr. Paulita Fujita at [facility], had endoscopy, concerning area, waiting for pathology.  Likely needs back surgery, legs ache all night, but figuring out GI issue first.  His mood and depression does well when he takes medications.  Sometimes at night, just will not take them when his wife gives them.   Continues with a few bourbon beverages daily.  No longer riding his stationary bike, causing back pain.  Going back and forth to [place], where their only daughter lives, have 2 grand children (2,5), goes [employer] games.  Drives a car m-f 8-6 court ordered from prior DWI.  Memory is stable, trouble with short-term memory, asked his wife repetitive questions, " what are we doing today?".  No falls.  Presents today for evaluation accompanied by his wife.  HISTORY  04/19/2020 SS: Brandon Fleming is a 75 year old male with history of alcohol overuse and mild memory disturbance and gait disturbance.  He was unable to tolerate Aricept secondary to diarrhea.  He remains on Namenda.  He is taking Remeron for sleep.  He does not eat much during the day, but has about 3-4 bourbon beverages a day, he eats a good dinner, likes ice cream before bed.  Memory is overall stable, has trouble with short-term, will ask his wife repetitive questions (what are we doing today?).  Long-term memory is good.  He has chronic back and leg pain, didn't have the planned injection because procedure was too painful.  The medications seem to agree with him, as long as he takes them.  His wife reminds him.  If he does not take the Remeron, he may be frustrated, up and down all night.  He has not had any falls, has had a few stumbles.  He drives a car without difficulty and does his own ADLs.  He uses a  recumbent bike about 45 minutes a day in the morning, while watching the news.  He is back and forth to [place] on the weekends, where his daughter lives.  He presents today for evaluation accompanied by his wife  REVIEW OF SYSTEMS: Out of a complete 14 system review of symptoms, the patient complains only of the following symptoms, and all other reviewed systems are negative.  Back pain, memory loss  ALLERGIES: Allergies  Allergen Reactions  . Aricept [Donepezil Hcl] Diarrhea and Other (See Comments)    Diarrhea, stomach  problems.   . Atropine Swelling and Other (See Comments)    Eyes swollen and red    HOME MEDICATIONS: Outpatient Medications Prior to Visit  Medication Sig Dispense Refill  . amLODipine-valsartan (EXFORGE) 10-160 MG tablet Take 1 tablet by mouth daily.    . fenofibrate (TRICOR) 145 MG tablet Take 145 mg by mouth daily.    [name] Kitchen FLUoxetine (PROZAC) 20 MG capsule TAKE 1 CAPSULE BY MOUTH EVERY DAY 90 capsule 3  . folic acid (FOLVITE) 1 MG tablet Take 1 mg by mouth daily.    [name] Kitchen levothyroxine (SYNTHROID) 50 MCG tablet Take 50 mcg by mouth daily.    . memantine (NAMENDA XR) 28 MG CP24 24 hr capsule Take 1 capsule (28 mg total) by mouth daily. 90 capsule 3  . mirtazapine (REMERON) 30 MG tablet TAKE 1 TABLET BY MOUTH AT BEDTIME. 90 tablet 3  . ondansetron (ZOFRAN-ODT) 4 MG disintegrating tablet Take by mouth.    . pantoprazole (PROTONIX) 40 MG tablet Take 40 mg by mouth daily.    . rosuvastatin (CRESTOR) 10 MG tablet Take 10 mg by mouth daily.     [name] Kitchen thiamine 100 MG tablet Take 100 mg by mouth daily.    . traMADol (ULTRAM) 50 MG tablet Take by mouth every 6 (six) hours as needed.    [name] Kitchen aspirin EC 81 MG tablet Take 81 mg by mouth daily.    [name] Kitchen COVID-19 mRNA Vac-TriS, Pfizer, SUSP injection Inject into the muscle. 0.3 mL 0   No facility-administered medications prior to visit.    PAST MEDICAL HISTORY: Past Medical History:  Diagnosis Date  . Alcohol abuse   . Gait abnormality 04/08/2018  . Hyperlipidemia   . Hypertension   . Kidney damage   . Memory difficulties 04/08/2018    PAST SURGICAL HISTORY: Past Surgical History:  Procedure Laterality Date  . BACK SURGERY    . COLONOSCOPY     no polyps  . COLONOSCOPY W/ POLYPECTOMY    . KNEE SURGERY Left 2011   fracture  . LASER PHOTO ABLATION Left 05/18/2018   Procedure: LASER PHOTO ABLATION;  Surgeon: Hayden Pedro, MD;  Location: [facility];  Service: Ophthalmology;  Laterality: Left;  . PARS PLANA VITRECTOMY Left 05/18/2018   Procedure: PARS PLANA  VITRECTOMY WITH 25G REMOVAL/SUTURE INTRAOCULAR LENS;  Surgeon: Hayden Pedro, MD;  Location: [facility];  Service: Ophthalmology;  Laterality: Left;  [name] Kitchen VITRECTOMY Left 05/18/2018   PARS PLANA VITRECTOMY WITH 25G REMOVAL/SUTURE INTRAOCULAR LENS (Left)    FAMILY HISTORY: Family History  Problem Relation Age of Onset  . CAD Father   . Kidney disease Father   . Parkinson's disease Mother   . Anesthesia problems Neg Hx   . Broken bones Neg Hx   . Cancer Neg Hx   . Clotting disorder Neg Hx   . Collagen disease Neg Hx   . Diabetes Neg Hx   . Dislocations Neg Hx   .  Osteoporosis Neg Hx   . Rheumatologic disease Neg Hx   . Scoliosis Neg Hx   . Severe sprains Neg Hx     SOCIAL HISTORY: Social History   Socioeconomic History  . Marital status: Married    Spouse name: Not on file  . Number of children: 1  . Years of education: 78  . Highest education level: Master's degree (e.g., MA, MS, MEng, MEd, MSW, MBA)  Occupational History  . Occupation: Retired  Tobacco Use  . Smoking status: Former Smoker    Types: Cigarettes    Quit date: 1978    Years since quitting: 44.3  . Smokeless tobacco: Never Used  Vaping Use  . Vaping Use: Never used  Substance and Sexual Activity  . Alcohol use: Yes    Comment: 8-10 ounces daily  . Drug use: No  . Sexual activity: Not on file  Other Topics Concern  . Not on file  Social History Narrative   Lives at home with his wife.   Right-handed.   Caffeine- minimal use (4 ounces tea/soda per day)   Social Determinants of Health   Financial Resource Strain: Not on file  Food Insecurity: Not on file  Transportation Needs: Not on file  Physical Activity: Not on file  Stress: Not on file  Social Connections: Not on file  Intimate Partner Violence: Not on file   PHYSICAL EXAM  Vitals:   04/23/21 0845  BP: 133/78  Pulse: 72  Weight: 158 lb (71.7 kg)  Height: 5\' 6"  (1.676 m)   Body mass index is 25.5 kg/m.  Generalized: Well developed,  in no acute distress  MMSE - Mini Mental State Exam 04/23/2021 10/24/2020 10/24/2020  Orientation to time 5 5 5   Orientation to Place 5 5 5   Registration 3 3 3   Attention/ Calculation 5 4 -  Recall 3 3 -  Language- name 2 objects 2 2 -  Language- repeat 1 1 -  Language- follow 3 step command 1 3 -  Language- read & follow direction 1 1 -  Write a sentence 1 1 -  Copy design 1 1 -  Copy design-comments - - -  Total score 28 29 -    Neurological examination  Mentation: Alert oriented to time, place, history taking. Follows all commands speech and language fluent Cranial nerve II-XII: Pupils were equal round reactive to light. Extraocular movements were full, visual field were full on confrontational test. Facial sensation and strength were normal. Head turning and shoulder shrug  were normal and symmetric. Motor: The motor testing reveals 5 over 5 strength of all 4 extremities. Good symmetric motor tone is noted throughout.  Sensory: Sensory testing is intact to soft touch on all 4 extremities. No evidence of extinction is noted.  Coordination: Cerebellar testing reveals good finger-nose-finger and heel-to-shin bilaterally.  Gait and station: Gait is slightly wide-based, steady Reflexes: Deep tendon reflexes are symmetric and normal bilaterally.   DIAGNOSTIC DATA (LABS, IMAGING, TESTING) - I reviewed patient records, labs, notes, testing and imaging myself where available.  Lab Results  Component Value Date   WBC 6.4 05/06/2018   HGB 9.8 (L) 05/06/2018   HCT 29.5 (L) 05/06/2018   MCV 96.7 05/06/2018   PLT 192 05/06/2018      Component Value Date/Time   NA 141 05/06/2018 0624   K 3.4 (L) 05/06/2018 0624   CL 107 05/06/2018 0624   CO2 25 05/06/2018 0624   GLUCOSE 122 (H) 05/06/2018 0624   BUN  17 05/06/2018 0624   CREATININE 1.18 05/06/2018 0624   CALCIUM 8.0 (L) 05/06/2018 0624   PROT 7.0 04/23/2018 1117   ALBUMIN 4.1 04/23/2018 1117   AST 47 (H) 04/23/2018 1117   ALT 26  04/23/2018 1117   ALKPHOS 48 04/23/2018 1117   BILITOT 0.6 04/23/2018 1117   GFRNONAA >60 05/06/2018 0624   GFRAA >60 05/06/2018 0624   No results found for: CHOL, HDL, LDLCALC, LDLDIRECT, TRIG, CHOLHDL No results found for: HGBA1C Lab Results  Component Value Date   VITAMINB12 335 02/26/2018   Lab Results  Component Value Date   TSH 4.465 02/26/2018   ASSESSMENT AND PLAN 75 y.o. year old male  has a past medical history of Alcohol abuse, Gait abnormality (04/08/2018), Hyperlipidemia, Hypertension, Kidney damage, and Memory difficulties (04/08/2018). here with:  1.  Mild memory disturbance 2.  Gait disturbance 3.  History of alcohol overuse 4.  Fatigue, insomnia  -MMSE 28/30 today, overall stable -Continue Remeron, Prozac, Namenda XR -Encouraged activity, social interaction, enjoys seeing grand kids -Seeing Dr. Arnoldo Morale for the back issues, on Tramadol  -Depression, mood doing well when taking medications, mirtazapine helps appetite -Follow-up in 1 year or sooner if needed  Evangeline Dakin, DNP 04/23/2021, 8:48 AM [facility] [address], [place], [place] [address] [phone]

## 2021-04-23 NOTE — Patient Instructions (Signed)
Continue current medications Refills sent  See you back in 1 year  

## 2021-04-23 NOTE — Progress Notes (Signed)
Office Visit Note   Patient: Brandon Fleming           Date of Birth: 01-18-1946           MRN: [id number] Visit Date: 04/23/2021              Requested by: Prince Solian, MD [address] [place],  [place] [address] PCP: Prince Solian, MD   Assessment & Plan: Visit Diagnoses:  1. Pain in left hip     Plan: Symptoms are consistent with greater trochanteric bursitis left hip.  X-rays did not demonstrate any arthritic changes in the left hip.  Patient does have chronic back problems but I think this is separate.  Will inject the area of greatest tenderness with betamethasone and monitor response  Follow-Up Instructions: Return if symptoms worsen or fail to improve.   Orders:  Orders Placed This Encounter  Procedures  . Large Joint Inj: L greater trochanter  . XR HIP UNILAT W OR W/O PELVIS 2-3 VIEWS LEFT   No orders of the defined types were placed in this encounter.     Procedures: Large Joint Inj: L greater trochanter on 04/23/2021 4:24 PM Indications: pain and diagnostic evaluation Details: 25 G 1.5 in needle, lateral approach  Arthrogram: No  Medications: 2 mL lidocaine 1 %; 2 mL bupivacaine 0.25 %  12 mg betamethasone injected into the point of tenderness over the greater trochanter left hip with Xylocaine and Marcaine Procedure, treatment alternatives, risks and benefits explained, specific risks discussed. Consent was given by the patient. Immediately prior to procedure a time out was called to verify the correct patient, procedure, equipment, support staff and site/side marked as required. Patient was prepped and draped in the usual sterile fashion.       Clinical Data: No additional findings.   Subjective: Chief Complaint  Patient presents with  . Left Hip - Pain  Patient presents today for left hip pain. He said that this pain started about 3-4 weeks ago. No injury. His pain is located at his lateral hip. No groin pain. He hurts more with bearing  weight on his left leg. He has right sided lower back pain, but states that this new pain is completely different. He takes tramadol for his back pain.   HPI  Review of Systems   Objective: Vital Signs: Ht 5\' 6"  (1.676 m)   Wt 158 lb (71.7 kg)   BMI 25.50 kg/m   Physical Exam Constitutional:      Appearance: He is well-developed.  Eyes:     Pupils: Pupils are equal, round, and reactive to light.  Pulmonary:     Effort: Pulmonary effort is normal.  Skin:    General: Skin is warm and dry.  Neurological:     Mental Status: He is alert and oriented to person, place, and time.  Psychiatric:        Behavior: Behavior normal.     Ortho Exam left hip with painless range of motion.  Point tender over the greater trochanter.  Skin intact.  No ecchymosis or erythema.  Straight leg raise negative.  Motor exam intact Specialty Comments:  No specialty comments available.  Imaging: XR HIP UNILAT W OR W/O PELVIS 2-3 VIEWS LEFT  Result Date: 04/23/2021 AP pelvis and lateral of the left hip were obtained demonstrating a normal left hip joint.  The femoral head was smooth and round and no joint space loss.  There is some irregularity of the greater trochanter with osteophyte  formation which could predispose to bursitis    PMFS History: Patient Active Problem List   Diagnosis Date Noted  . Insomnia 04/19/2020  . Dislocated intraocular lens 05/18/2018  . Dislocated intraocular lens, sequela 05/05/2018  . Spondylolisthesis of lumbar region 05/05/2018  . Memory difficulties 04/08/2018  . Gait abnormality 04/08/2018  . Alcohol withdrawal ([place]) 02/28/2018  . Weakness 02/26/2018  . Essential hypertension 02/26/2018  . HLD (hyperlipidemia) 02/26/2018  . Tremors of nervous system 02/26/2018  . Alcohol abuse 02/26/2018  . Weakness generalized 02/26/2018   Past Medical History:  Diagnosis Date  . Alcohol abuse   . Gait abnormality 04/08/2018  . Hyperlipidemia   . Hypertension   .  Kidney damage   . Memory difficulties 04/08/2018    Family History  Problem Relation Age of Onset  . CAD Father   . Kidney disease Father   . Parkinson's disease Mother   . Anesthesia problems Neg Hx   . Broken bones Neg Hx   . Cancer Neg Hx   . Clotting disorder Neg Hx   . Collagen disease Neg Hx   . Diabetes Neg Hx   . Dislocations Neg Hx   . Osteoporosis Neg Hx   . Rheumatologic disease Neg Hx   . Scoliosis Neg Hx   . Severe sprains Neg Hx     Past Surgical History:  Procedure Laterality Date  . BACK SURGERY    . COLONOSCOPY     no polyps  . COLONOSCOPY W/ POLYPECTOMY    . KNEE SURGERY Left 2011   fracture  . LASER PHOTO ABLATION Left 05/18/2018   Procedure: LASER PHOTO ABLATION;  Surgeon: Hayden Pedro, MD;  Location: [facility];  Service: Ophthalmology;  Laterality: Left;  . PARS PLANA VITRECTOMY Left 05/18/2018   Procedure: PARS PLANA VITRECTOMY WITH 25G REMOVAL/SUTURE INTRAOCULAR LENS;  Surgeon: Hayden Pedro, MD;  Location: [facility];  Service: Ophthalmology;  Laterality: Left;  [name] Kitchen VITRECTOMY Left 05/18/2018   PARS PLANA VITRECTOMY WITH 25G REMOVAL/SUTURE INTRAOCULAR LENS (Left)   Social History   Occupational History  . Occupation: Retired  Tobacco Use  . Smoking status: Former Smoker    Types: Cigarettes    Quit date: 1978    Years since quitting: 44.3  . Smokeless tobacco: Never Used  Vaping Use  . Vaping Use: Never used  Substance and Sexual Activity  . Alcohol use: Yes    Comment: 8-10 ounces daily  . Drug use: No  . Sexual activity: Not on file

## 2021-04-26 DIAGNOSIS — H903 Sensorineural hearing loss, bilateral: Secondary | ICD-10-CM | POA: Diagnosis not present

## 2021-04-26 DIAGNOSIS — H838X3 Other specified diseases of inner ear, bilateral: Secondary | ICD-10-CM | POA: Diagnosis not present

## 2021-04-26 DIAGNOSIS — H9121 Sudden idiopathic hearing loss, right ear: Secondary | ICD-10-CM | POA: Diagnosis not present

## 2021-05-02 DIAGNOSIS — H9121 Sudden idiopathic hearing loss, right ear: Secondary | ICD-10-CM | POA: Diagnosis not present

## 2021-05-02 DIAGNOSIS — H838X3 Other specified diseases of inner ear, bilateral: Secondary | ICD-10-CM | POA: Diagnosis not present

## 2021-05-02 DIAGNOSIS — H903 Sensorineural hearing loss, bilateral: Secondary | ICD-10-CM | POA: Diagnosis not present

## 2021-06-10 ENCOUNTER — Encounter (INDEPENDENT_AMBULATORY_CARE_PROVIDER_SITE_OTHER): Payer: Self-pay | Admitting: Interventional Cardiology

## 2021-06-11 NOTE — Telephone Encounter (Signed)
Patient calling to schedule with Dr Velora Heckler. PSS unable to schedule as we are awaiting September 2022 at this time.    Confirmed we will call back when the template is out.

## 2021-06-14 NOTE — Telephone Encounter (Signed)
Patient will need TOC from [facility].

## 2021-06-24 ENCOUNTER — Ambulatory Visit: Payer: Medicare PPO | Attending: Cardiovascular Disease

## 2021-06-24 DIAGNOSIS — I480 Paroxysmal atrial fibrillation: Secondary | ICD-10-CM | POA: Insufficient documentation

## 2021-06-24 DIAGNOSIS — I442 Atrioventricular block, complete: Secondary | ICD-10-CM

## 2021-06-24 DIAGNOSIS — Z950 Presence of cardiac pacemaker: Secondary | ICD-10-CM | POA: Insufficient documentation

## 2021-06-30 ENCOUNTER — Other Ambulatory Visit (HOSPITAL_BASED_OUTPATIENT_CLINIC_OR_DEPARTMENT_OTHER): Payer: Self-pay | Admitting: Cardiovascular Disease

## 2021-06-30 DIAGNOSIS — I10 Essential (primary) hypertension: Secondary | ICD-10-CM

## 2021-07-03 MED ORDER — LOSARTAN POTASSIUM 100 MG OR TABS
ORAL_TABLET | ORAL | 3 refills | Status: AC
Start: 2021-07-03 — End: ?

## 2021-07-03 NOTE — Telephone Encounter (Signed)
Patient would like to switch care to [facility], will call us if he changes his mind.

## 2021-07-03 NOTE — Telephone Encounter (Signed)
July 2021-Essential hypertension - metoprolol discontinued and losartan increased to 100 mg. HCTZ continued at 25 mg.      Return to clinic in 1-2 months to establish care with one of the general cardiology MDs    Overdue for follow up

## 2021-07-04 DIAGNOSIS — M4180 Other forms of scoliosis, site unspecified: Secondary | ICD-10-CM | POA: Diagnosis not present

## 2021-07-04 DIAGNOSIS — M545 Low back pain, unspecified: Secondary | ICD-10-CM | POA: Diagnosis not present

## 2021-07-09 DIAGNOSIS — M5416 Radiculopathy, lumbar region: Secondary | ICD-10-CM | POA: Diagnosis not present

## 2021-07-09 DIAGNOSIS — Z6825 Body mass index (BMI) 25.0-25.9, adult: Secondary | ICD-10-CM | POA: Diagnosis not present

## 2021-07-09 DIAGNOSIS — I10 Essential (primary) hypertension: Secondary | ICD-10-CM | POA: Diagnosis not present

## 2021-07-09 DIAGNOSIS — M48061 Spinal stenosis, lumbar region without neurogenic claudication: Secondary | ICD-10-CM | POA: Diagnosis not present

## 2021-07-11 ENCOUNTER — Other Ambulatory Visit: Payer: Self-pay | Admitting: Neurosurgery

## 2021-07-16 ENCOUNTER — Other Ambulatory Visit: Payer: Self-pay | Admitting: Neurosurgery

## 2021-08-02 NOTE — Pre-Procedure Instructions (Signed)
Surgical Instructions    Your procedure is scheduled on Wednesday 08/14/21. 05:30  Report to [facility] Main Entrance "A" at 05:30 A.M., then check in with the Admitting office.  Call this number if you have problems the morning of surgery:  [phone]   If you have any questions prior to your surgery date call [phone]: Open Monday-Friday 8am-4pm    Remember:  Do not eat or drink after midnight the night before your surgery     Take these medicines the morning of surgery with A SIP OF WATER   fenofibrate (TRICOR)   FLUoxetine (PROZAC)  levothyroxine (SYNTHROID)  pantoprazole (PROTONIX)  rosuvastatin (CRESTOR)    Take these medicines if needed:   traMADol (ULTRAM)   ondansetron (ZOFRAN-ODT)   As of today, STOP taking any Aspirin (unless otherwise instructed by your surgeon) Aleve, Naproxen, Ibuprofen, Motrin, Advil, Goody's, BC's, all herbal medications, fish oil, and all vitamins.                     Do NOT Smoke (Tobacco/Vaping) or drink Alcohol 24 hours prior to your procedure.  If you use a CPAP at night, you may bring all equipment for your overnight stay.   Contacts, glasses, piercing's, hearing aid's, dentures or partials may not be worn into surgery, please bring cases for these belongings.    For patients admitted to the hospital, discharge time will be determined by your treatment team.   Patients discharged the day of surgery will not be allowed to drive home, and someone needs to stay with them for 24 hours.  ONLY 1 SUPPORT PERSON MAY BE PRESENT WHILE YOU ARE IN SURGERY. IF YOU ARE TO BE ADMITTED ONCE YOU ARE IN YOUR ROOM YOU WILL BE ALLOWED TWO (2) VISITORS.  Minor children may have two parents present. Special consideration for safety and communication needs will be reviewed on a case by case basis.   Special instructions:   [facility]- Preparing For Surgery  Before surgery, you can play an important role. Because skin is not sterile, your skin needs  to be as free of germs as possible. You can reduce the number of germs on your skin by washing with CHG (chlorahexidine gluconate) Soap before surgery.  CHG is an antiseptic cleaner which kills germs and bonds with the skin to continue killing germs even after washing.    Oral Hygiene is also important to reduce your risk of infection.  Remember - BRUSH YOUR TEETH THE MORNING OF SURGERY WITH YOUR REGULAR TOOTHPASTE  Please do not use if you have an allergy to CHG or antibacterial soaps. If your skin becomes reddened/irritated stop using the CHG.  Do not shave (including legs and underarms) for at least 48 hours prior to first CHG shower. It is OK to shave your face.  Please follow these instructions carefully.   Shower the NIGHT BEFORE SURGERY and the MORNING OF SURGERY  If you chose to wash your hair, wash your hair first as usual with your normal shampoo.  After you shampoo, rinse your hair and body thoroughly to remove the shampoo.  Use CHG Soap as you would any other liquid soap. You can apply CHG directly to the skin and wash gently with a scrungie or a clean washcloth.   Apply the CHG Soap to your body ONLY FROM THE NECK DOWN.  Do not use on open wounds or open sores. Avoid contact with your eyes, ears, mouth and genitals (private parts). Wash Face and  genitals (private parts)  with your normal soap.   Wash thoroughly, paying special attention to the area where your surgery will be performed.  Thoroughly rinse your body with warm water from the neck down.  DO NOT shower/wash with your normal soap after using and rinsing off the CHG Soap.  Pat yourself dry with a CLEAN TOWEL.  Wear CLEAN PAJAMAS to bed the night before surgery  Place CLEAN SHEETS on your bed the night before your surgery  DO NOT SLEEP WITH PETS.   Day of Surgery: Shower with CHG soap. Do not wear jewelry, make up, nail polish, gel polish, artificial nails, or any other type of covering on natural nails  including finger and toenails. If patients have artificial nails, gel coating, etc. that need to be removed by a nail salon please have this removed prior to surgery. Surgery may need to be canceled/delayed if the surgeon/ anesthesia feels like the patient is unable to be adequately monitored. Do not wear lotions, powders, perfumes/colognes, or deodorant. Do not shave 48 hours prior to surgery.  Men may shave face and neck. Do not bring valuables to the hospital. [facility] is not responsible for any belongings or valuables. Wear Clean/Comfortable clothing the morning of surgery Remember to brush your teeth WITH YOUR REGULAR TOOTHPASTE.   Please read over the following fact sheets that you were given.

## 2021-08-05 ENCOUNTER — Encounter (HOSPITAL_COMMUNITY): Payer: Self-pay

## 2021-08-05 ENCOUNTER — Other Ambulatory Visit: Payer: Self-pay

## 2021-08-05 ENCOUNTER — Encounter (HOSPITAL_COMMUNITY)
Admission: RE | Admit: 2021-08-05 | Discharge: 2021-08-05 | Disposition: A | Discharge status: Home / Self Care | Payer: Medicare Other | Source: Ambulatory Visit | Attending: Neurosurgery | Admitting: Neurosurgery

## 2021-08-05 DIAGNOSIS — Z01812 Encounter for preprocedural laboratory examination: Secondary | ICD-10-CM | POA: Diagnosis not present

## 2021-08-05 HISTORY — DX: Gastro-esophageal reflux disease without esophagitis: K21.9

## 2021-08-05 HISTORY — DX: Anxiety disorder, unspecified: F41.9

## 2021-08-05 HISTORY — DX: Depression, unspecified: F32.A

## 2021-08-05 LAB — CBC
HCT: 34.1 % — ABNORMAL LOW (ref 39.0–52.0)
Hemoglobin: 11.3 g/dL — ABNORMAL LOW (ref 13.0–17.0)
MCH: 32.8 pg (ref 26.0–34.0)
MCHC: 33.1 g/dL (ref 30.0–36.0)
MCV: 99.1 fL (ref 80.0–100.0)
Platelets: 217 10*3/uL (ref 150–400)
RBC: 3.44 MIL/uL — ABNORMAL LOW (ref 4.22–5.81)
RDW: 12.3 % (ref 11.5–15.5)
WBC: 4.2 10*3/uL (ref 4.0–10.5)
nRBC: 0 % (ref 0.0–0.2)

## 2021-08-05 LAB — TYPE AND SCREEN
ABO/RH(D): A NEG
Antibody Screen: NEGATIVE

## 2021-08-05 LAB — SURGICAL PCR SCREEN
MRSA, PCR: NEGATIVE
Staphylococcus aureus: POSITIVE — AB

## 2021-08-05 NOTE — Progress Notes (Signed)
PCP - Prince Solian, MD Cardiologist - denies  PPM/ICD - denies Device Orders - N/A Rep Notified - N/A  Chest x-ray - N/A EKG - 08/05/2021 Stress Test - denies ECHO - denies Cardiac Cath - denies  Sleep Study - denies CPAP - N/A  Fasting Blood Sugar - N/A  Blood Thinner Instructions: N/A  Aspirin Instructions: Patient was instructed: As of today, STOP taking any Aspirin (unless otherwise instructed by your surgeon) Aleve, Naproxen, Ibuprofen, Motrin, Advil, Goody's, BC's, all herbal medications, fish oil, and all vitamins.  ERAS Protcol - no  COVID TEST- patient was instructed to go to the [place] testing site on 08/12/2021   Anesthesia review: No  Patient denies shortness of breath, fever, cough and chest pain at PAT appointment   All instructions explained to the patient, with a verbal understanding of the material. Patient agrees to go over the instructions while at home for a better understanding. Patient also instructed to self quarantine after being tested for COVID-19. The opportunity to ask questions was provided.

## 2021-08-12 ENCOUNTER — Other Ambulatory Visit: Payer: Self-pay | Admitting: Neurosurgery

## 2021-08-13 LAB — SARS CORONAVIRUS 2 (TAT 6-24 HRS): SARS Coronavirus 2: NEGATIVE

## 2021-08-13 NOTE — Anesthesia Preprocedure Evaluation (Addendum)
Anesthesia Evaluation  Patient identified by MRN, date of birth, ID band Patient awake    Reviewed: Allergy & Precautions, NPO status , Patient's Chart, lab work & pertinent test results  History of Anesthesia Complications Negative for: history of anesthetic complications  Airway Mallampati: II  TM Distance: >3 FB Neck ROM: Full    Dental  (+) Dental Advisory Given, Teeth Intact   Pulmonary neg pulmonary ROS, former smoker,    Pulmonary exam normal        Cardiovascular hypertension, Pt. on medications Normal cardiovascular exam     Neuro/Psych Anxiety Depression negative neurological ROS     GI/Hepatic GERD  ,(+)     substance abuse  alcohol use,   Endo/Other  Hypothyroidism   Renal/GU negative Renal ROS  negative genitourinary   Musculoskeletal negative musculoskeletal ROS (+)   Abdominal   Peds  Hematology  (+) anemia ,   Anesthesia Other Findings   Reproductive/Obstetrics                            Anesthesia Physical Anesthesia Plan  ASA: 2  Anesthesia Plan: General   Post-op Pain Management:    Induction: Intravenous  PONV Risk Score and Plan: 2 and Ondansetron, Dexamethasone, Treatment may vary due to age or medical condition and Midazolam  Airway Management Planned: Oral ETT  Additional Equipment: None  Intra-op Plan:   Post-operative Plan: Extubation in OR  Informed Consent: I have reviewed the patients History and Physical, chart, labs and discussed the procedure including the risks, benefits and alternatives for the proposed anesthesia with the patient or authorized representative who has indicated his/her understanding and acceptance.     Dental advisory given  Plan Discussed with:   Anesthesia Plan Comments:        Anesthesia Quick Evaluation

## 2021-08-14 ENCOUNTER — Inpatient Hospital Stay (HOSPITAL_COMMUNITY): Payer: Medicare Other | Admitting: Anesthesiology

## 2021-08-14 ENCOUNTER — Inpatient Hospital Stay (HOSPITAL_COMMUNITY): Payer: Medicare Other

## 2021-08-14 ENCOUNTER — Encounter (HOSPITAL_COMMUNITY)
Admission: RE | Disposition: A | Discharge status: Home / Self Care | Payer: Self-pay | Source: Home / Self Care | Attending: Neurosurgery

## 2021-08-14 ENCOUNTER — Inpatient Hospital Stay (HOSPITAL_COMMUNITY)
Admission: RE | Admit: 2021-08-14 | Discharge: 2021-08-15 | DRG: 454 | Disposition: A | Discharge status: Home / Self Care | Payer: Medicare Other | Attending: Neurosurgery | Admitting: Neurosurgery

## 2021-08-14 ENCOUNTER — Encounter (HOSPITAL_COMMUNITY): Payer: Self-pay | Admitting: Neurosurgery

## 2021-08-14 DIAGNOSIS — Z888 Allergy status to other drugs, medicaments and biological substances status: Secondary | ICD-10-CM | POA: Diagnosis not present

## 2021-08-14 DIAGNOSIS — Z79899 Other long term (current) drug therapy: Secondary | ICD-10-CM | POA: Diagnosis not present

## 2021-08-14 DIAGNOSIS — M4726 Other spondylosis with radiculopathy, lumbar region: Secondary | ICD-10-CM | POA: Diagnosis present

## 2021-08-14 DIAGNOSIS — M5186 Other intervertebral disc disorders, lumbar region: Secondary | ICD-10-CM | POA: Diagnosis not present

## 2021-08-14 DIAGNOSIS — M4316 Spondylolisthesis, lumbar region: Principal | ICD-10-CM | POA: Diagnosis present

## 2021-08-14 DIAGNOSIS — Z8249 Family history of ischemic heart disease and other diseases of the circulatory system: Secondary | ICD-10-CM | POA: Diagnosis not present

## 2021-08-14 DIAGNOSIS — Z419 Encounter for procedure for purposes other than remedying health state, unspecified: Secondary | ICD-10-CM

## 2021-08-14 DIAGNOSIS — M5116 Intervertebral disc disorders with radiculopathy, lumbar region: Secondary | ICD-10-CM | POA: Diagnosis present

## 2021-08-14 DIAGNOSIS — Z981 Arthrodesis status: Secondary | ICD-10-CM | POA: Diagnosis not present

## 2021-08-14 DIAGNOSIS — Z820 Family history of epilepsy and other diseases of the nervous system: Secondary | ICD-10-CM

## 2021-08-14 DIAGNOSIS — Z841 Family history of disorders of kidney and ureter: Secondary | ICD-10-CM

## 2021-08-14 DIAGNOSIS — M960 Pseudarthrosis after fusion or arthrodesis: Secondary | ICD-10-CM | POA: Diagnosis present

## 2021-08-14 DIAGNOSIS — Z87891 Personal history of nicotine dependence: Secondary | ICD-10-CM | POA: Diagnosis not present

## 2021-08-14 DIAGNOSIS — M48061 Spinal stenosis, lumbar region without neurogenic claudication: Secondary | ICD-10-CM | POA: Diagnosis not present

## 2021-08-14 DIAGNOSIS — M48062 Spinal stenosis, lumbar region with neurogenic claudication: Secondary | ICD-10-CM | POA: Diagnosis present

## 2021-08-14 DIAGNOSIS — M5136 Other intervertebral disc degeneration, lumbar region: Secondary | ICD-10-CM

## 2021-08-14 DIAGNOSIS — Z79890 Hormone replacement therapy (postmenopausal): Secondary | ICD-10-CM

## 2021-08-14 DIAGNOSIS — I10 Essential (primary) hypertension: Secondary | ICD-10-CM | POA: Diagnosis present

## 2021-08-14 DIAGNOSIS — E785 Hyperlipidemia, unspecified: Secondary | ICD-10-CM | POA: Diagnosis present

## 2021-08-14 DIAGNOSIS — E039 Hypothyroidism, unspecified: Secondary | ICD-10-CM | POA: Diagnosis present

## 2021-08-14 DIAGNOSIS — K219 Gastro-esophageal reflux disease without esophagitis: Secondary | ICD-10-CM | POA: Diagnosis present

## 2021-08-14 DIAGNOSIS — M4326 Fusion of spine, lumbar region: Secondary | ICD-10-CM | POA: Diagnosis not present

## 2021-08-14 SURGERY — POSTERIOR LUMBAR FUSION 1 LEVEL
Anesthesia: General

## 2021-08-14 MED ORDER — BUPIVACAINE-EPINEPHRINE 0.5% -1:200000 IJ SOLN
INTRAMUSCULAR | Status: AC
  Filled 2021-08-14: qty 1

## 2021-08-14 MED ORDER — PANTOPRAZOLE SODIUM 40 MG PO TBEC
40.0000 mg | DELAYED_RELEASE_TABLET | Freq: Every day | ORAL | Status: DC
  Administered 2021-08-15: 40 mg via ORAL
  Filled 2021-08-14: qty 1

## 2021-08-14 MED ORDER — PHENOL 1.4 % MT LIQD: 1.0000 | OROMUCOSAL | Status: DC | PRN

## 2021-08-14 MED ORDER — LACTATED RINGERS IV SOLN: INTRAVENOUS | Status: DC

## 2021-08-14 MED ORDER — DEXAMETHASONE SODIUM PHOSPHATE 10 MG/ML IJ SOLN
INTRAMUSCULAR | Status: AC
  Filled 2021-08-14: qty 1

## 2021-08-14 MED ORDER — FOLIC ACID 1 MG PO TABS
1.0000 mg | ORAL_TABLET | Freq: Every day | ORAL | Status: DC
  Administered 2021-08-15: 1 mg via ORAL
  Filled 2021-08-14: qty 1

## 2021-08-14 MED ORDER — MEMANTINE HCL ER 28 MG PO CP24
28.0000 mg | ORAL_CAPSULE | Freq: Every day | ORAL | Status: DC
  Administered 2021-08-14 – 2021-08-15 (×2): 28 mg via ORAL
  Filled 2021-08-14 (×2): qty 1

## 2021-08-14 MED ORDER — DOCUSATE SODIUM 100 MG PO CAPS
100.0000 mg | ORAL_CAPSULE | Freq: Two times a day (BID) | ORAL | Status: DC
  Administered 2021-08-14 – 2021-08-15 (×2): 100 mg via ORAL
  Filled 2021-08-14 (×2): qty 1

## 2021-08-14 MED ORDER — MENTHOL 3 MG MT LOZG: 1.0000 | LOZENGE | OROMUCOSAL | Status: DC | PRN

## 2021-08-14 MED ORDER — AMLODIPINE BESYLATE-VALSARTAN 10-160 MG PO TABS: 1.0000 | ORAL_TABLET | Freq: Every day | ORAL | Status: DC

## 2021-08-14 MED ORDER — IRBESARTAN 150 MG PO TABS
150.0000 mg | ORAL_TABLET | Freq: Every day | ORAL | Status: DC
  Filled 2021-08-14: qty 1

## 2021-08-14 MED ORDER — ROCURONIUM BROMIDE 10 MG/ML (PF) SYRINGE
PREFILLED_SYRINGE | INTRAVENOUS | Status: AC
  Filled 2021-08-14: qty 10

## 2021-08-14 MED ORDER — BACITRACIN ZINC 500 UNIT/GM EX OINT
TOPICAL_OINTMENT | CUTANEOUS | Status: AC
  Filled 2021-08-14: qty 28.35

## 2021-08-14 MED ORDER — BACITRACIN ZINC 500 UNIT/GM EX OINT
TOPICAL_OINTMENT | CUTANEOUS | Status: DC | PRN
  Administered 2021-08-14: 1 via TOPICAL

## 2021-08-14 MED ORDER — ACETAMINOPHEN 500 MG PO TABS
1000.0000 mg | ORAL_TABLET | Freq: Four times a day (QID) | ORAL | Status: AC
  Administered 2021-08-14 (×3): 1000 mg via ORAL
  Filled 2021-08-14 (×3): qty 2

## 2021-08-14 MED ORDER — THROMBIN 5000 UNITS EX SOLR
OROMUCOSAL | Status: DC | PRN
  Administered 2021-08-14: 5 mL via TOPICAL

## 2021-08-14 MED ORDER — CEFAZOLIN SODIUM-DEXTROSE 2-4 GM/100ML-% IV SOLN
2.0000 g | INTRAVENOUS | Status: AC
Start: 2021-08-14 — End: 2021-08-14
  Administered 2021-08-14: 2 g via INTRAVENOUS
  Filled 2021-08-14: qty 100

## 2021-08-14 MED ORDER — ONDANSETRON HCL 4 MG/2ML IJ SOLN: 4.0000 mg | Freq: Once | INTRAMUSCULAR | Status: DC | PRN

## 2021-08-14 MED ORDER — HYDROMORPHONE HCL 1 MG/ML IJ SOLN
INTRAMUSCULAR | Status: AC
  Filled 2021-08-14: qty 1

## 2021-08-14 MED ORDER — BISACODYL 10 MG RE SUPP: 10.0000 mg | Freq: Every day | RECTAL | Status: DC | PRN

## 2021-08-14 MED ORDER — CHLORHEXIDINE GLUCONATE 0.12 % MT SOLN
15.0000 mL | Freq: Once | OROMUCOSAL | Status: AC
  Administered 2021-08-14: 15 mL via OROMUCOSAL
  Filled 2021-08-14: qty 15

## 2021-08-14 MED ORDER — OXYCODONE HCL 5 MG/5ML PO SOLN: 5.0000 mg | Freq: Once | ORAL | Status: DC | PRN

## 2021-08-14 MED ORDER — LIDOCAINE 2% (20 MG/ML) 5 ML SYRINGE
INTRAMUSCULAR | Status: AC
  Filled 2021-08-14: qty 5

## 2021-08-14 MED ORDER — CHLORHEXIDINE GLUCONATE CLOTH 2 % EX PADS: 6.0000 | MEDICATED_PAD | Freq: Once | CUTANEOUS | Status: DC

## 2021-08-14 MED ORDER — ONDANSETRON HCL 4 MG/2ML IJ SOLN
INTRAMUSCULAR | Status: DC | PRN
  Administered 2021-08-14: 4 mg via INTRAVENOUS

## 2021-08-14 MED ORDER — ROSUVASTATIN CALCIUM 5 MG PO TABS
10.0000 mg | ORAL_TABLET | Freq: Every day | ORAL | Status: DC
  Administered 2021-08-15: 10 mg via ORAL
  Filled 2021-08-14: qty 2

## 2021-08-14 MED ORDER — PROPOFOL 10 MG/ML IV BOLUS
INTRAVENOUS | Status: AC
  Filled 2021-08-14: qty 20

## 2021-08-14 MED ORDER — PHENYLEPHRINE HCL-NACL 20-0.9 MG/250ML-% IV SOLN
INTRAVENOUS | Status: DC | PRN
  Administered 2021-08-14: 25 ug/min via INTRAVENOUS

## 2021-08-14 MED ORDER — LIDOCAINE 2% (20 MG/ML) 5 ML SYRINGE
INTRAMUSCULAR | Status: DC | PRN
  Administered 2021-08-14: 100 mg via INTRAVENOUS

## 2021-08-14 MED ORDER — DEXAMETHASONE SODIUM PHOSPHATE 10 MG/ML IJ SOLN
INTRAMUSCULAR | Status: DC | PRN
Start: 2021-08-14 — End: 2021-08-14
  Administered 2021-08-14: 10 mg via INTRAVENOUS

## 2021-08-14 MED ORDER — SODIUM CHLORIDE 0.9 % IV SOLN: 250.0000 mL | INTRAVENOUS | Status: DC

## 2021-08-14 MED ORDER — BUPIVACAINE LIPOSOME 1.3 % IJ SUSP
INTRAMUSCULAR | Status: DC | PRN
  Administered 2021-08-14: 20 mL

## 2021-08-14 MED ORDER — FENTANYL CITRATE (PF) 250 MCG/5ML IJ SOLN
INTRAMUSCULAR | Status: DC | PRN
  Administered 2021-08-14: 50 ug via INTRAVENOUS
  Administered 2021-08-14: 100 ug via INTRAVENOUS
  Administered 2021-08-14 (×2): 50 ug via INTRAVENOUS

## 2021-08-14 MED ORDER — FENOFIBRATE 160 MG PO TABS
160.0000 mg | ORAL_TABLET | Freq: Every day | ORAL | Status: DC
  Administered 2021-08-15: 160 mg via ORAL
  Filled 2021-08-14: qty 1

## 2021-08-14 MED ORDER — ONDANSETRON HCL 4 MG/2ML IJ SOLN
INTRAMUSCULAR | Status: AC
  Filled 2021-08-14: qty 2

## 2021-08-14 MED ORDER — ONDANSETRON HCL 4 MG PO TABS: 4.0000 mg | ORAL_TABLET | Freq: Four times a day (QID) | ORAL | Status: DC | PRN

## 2021-08-14 MED ORDER — BUPIVACAINE LIPOSOME 1.3 % IJ SUSP
INTRAMUSCULAR | Status: AC
  Filled 2021-08-14: qty 20

## 2021-08-14 MED ORDER — AMISULPRIDE (ANTIEMETIC) 5 MG/2ML IV SOLN: 10.0000 mg | Freq: Once | INTRAVENOUS | Status: DC | PRN

## 2021-08-14 MED ORDER — AMLODIPINE BESYLATE 5 MG PO TABS
5.0000 mg | ORAL_TABLET | Freq: Every day | ORAL | Status: DC
  Filled 2021-08-14: qty 1

## 2021-08-14 MED ORDER — PHENYLEPHRINE 40 MCG/ML (10ML) SYRINGE FOR IV PUSH (FOR BLOOD PRESSURE SUPPORT)
PREFILLED_SYRINGE | INTRAVENOUS | Status: AC
  Filled 2021-08-14: qty 10

## 2021-08-14 MED ORDER — SODIUM CHLORIDE 0.9% FLUSH
3.0000 mL | Freq: Two times a day (BID) | INTRAVENOUS | Status: DC
  Administered 2021-08-15: 3 mL via INTRAVENOUS

## 2021-08-14 MED ORDER — HYDROMORPHONE HCL 1 MG/ML IJ SOLN
0.2500 mg | INTRAMUSCULAR | Status: DC | PRN
  Administered 2021-08-14: 0.5 mg via INTRAVENOUS
  Administered 2021-08-14 (×2): 0.25 mg via INTRAVENOUS

## 2021-08-14 MED ORDER — MIRTAZAPINE 30 MG PO TABS
30.0000 mg | ORAL_TABLET | Freq: Every day | ORAL | Status: DC
  Administered 2021-08-14: 30 mg via ORAL
  Filled 2021-08-14 (×2): qty 1

## 2021-08-14 MED ORDER — BUPIVACAINE-EPINEPHRINE (PF) 0.5% -1:200000 IJ SOLN
INTRAMUSCULAR | Status: DC | PRN
  Administered 2021-08-14: 10 mL via PERINEURAL

## 2021-08-14 MED ORDER — CEFAZOLIN SODIUM-DEXTROSE 2-4 GM/100ML-% IV SOLN
2.0000 g | Freq: Three times a day (TID) | INTRAVENOUS | Status: AC
  Administered 2021-08-14 (×2): 2 g via INTRAVENOUS
  Filled 2021-08-14 (×2): qty 100

## 2021-08-14 MED ORDER — OXYCODONE HCL 5 MG PO TABS
10.0000 mg | ORAL_TABLET | ORAL | Status: DC | PRN
  Administered 2021-08-14 – 2021-08-15 (×6): 10 mg via ORAL
  Filled 2021-08-14 (×6): qty 2

## 2021-08-14 MED ORDER — MORPHINE SULFATE (PF) 4 MG/ML IV SOLN
4.0000 mg | INTRAVENOUS | Status: DC | PRN
  Administered 2021-08-14: 4 mg via INTRAVENOUS
  Filled 2021-08-14: qty 1

## 2021-08-14 MED ORDER — 0.9 % SODIUM CHLORIDE (POUR BTL) OPTIME
TOPICAL | Status: DC | PRN
  Administered 2021-08-14: 1000 mL

## 2021-08-14 MED ORDER — THIAMINE HCL 100 MG PO TABS
100.0000 mg | ORAL_TABLET | Freq: Every day | ORAL | Status: DC
  Administered 2021-08-15: 100 mg via ORAL
  Filled 2021-08-14: qty 1

## 2021-08-14 MED ORDER — SODIUM CHLORIDE 0.9% FLUSH: 3.0000 mL | INTRAVENOUS | Status: DC | PRN

## 2021-08-14 MED ORDER — OXYCODONE HCL 5 MG PO TABS: 5.0000 mg | ORAL_TABLET | ORAL | Status: DC | PRN

## 2021-08-14 MED ORDER — FLUOXETINE HCL 20 MG PO CAPS
20.0000 mg | ORAL_CAPSULE | Freq: Every day | ORAL | Status: DC
  Administered 2021-08-15: 20 mg via ORAL
  Filled 2021-08-14: qty 1

## 2021-08-14 MED ORDER — LEVOTHYROXINE SODIUM 25 MCG PO TABS
50.0000 ug | ORAL_TABLET | Freq: Every day | ORAL | Status: DC
  Administered 2021-08-15: 50 ug via ORAL
  Filled 2021-08-14: qty 2

## 2021-08-14 MED ORDER — ACETAMINOPHEN 325 MG PO TABS
650.0000 mg | ORAL_TABLET | ORAL | Status: DC | PRN
  Administered 2021-08-15: 650 mg via ORAL
  Filled 2021-08-14: qty 2

## 2021-08-14 MED ORDER — SUGAMMADEX SODIUM 200 MG/2ML IV SOLN
INTRAVENOUS | Status: DC | PRN
  Administered 2021-08-14: 200 mg via INTRAVENOUS

## 2021-08-14 MED ORDER — PROPOFOL 10 MG/ML IV BOLUS
INTRAVENOUS | Status: DC | PRN
  Administered 2021-08-14: 50 mg via INTRAVENOUS
  Administered 2021-08-14: 150 mg via INTRAVENOUS

## 2021-08-14 MED ORDER — PHENYLEPHRINE 40 MCG/ML (10ML) SYRINGE FOR IV PUSH (FOR BLOOD PRESSURE SUPPORT)
PREFILLED_SYRINGE | INTRAVENOUS | Status: DC | PRN
Start: 2021-08-14 — End: 2021-08-14
  Administered 2021-08-14 (×2): 80 ug via INTRAVENOUS
  Administered 2021-08-14: 120 ug via INTRAVENOUS
  Administered 2021-08-14: 80 ug via INTRAVENOUS

## 2021-08-14 MED ORDER — ORAL CARE MOUTH RINSE: 15.0000 mL | Freq: Once | OROMUCOSAL | Status: AC

## 2021-08-14 MED ORDER — OXYCODONE HCL 5 MG PO TABS
5.0000 mg | ORAL_TABLET | Freq: Once | ORAL | Status: DC | PRN
Start: 2021-08-14 — End: 2021-08-14

## 2021-08-14 MED ORDER — TRAMADOL HCL 50 MG PO TABS: 50.0000 mg | ORAL_TABLET | Freq: Three times a day (TID) | ORAL | Status: DC | PRN

## 2021-08-14 MED ORDER — ONDANSETRON HCL 4 MG/2ML IJ SOLN: 4.0000 mg | Freq: Four times a day (QID) | INTRAMUSCULAR | Status: DC | PRN

## 2021-08-14 MED ORDER — ROCURONIUM BROMIDE 10 MG/ML (PF) SYRINGE
PREFILLED_SYRINGE | INTRAVENOUS | Status: DC | PRN
Start: 2021-08-14 — End: 2021-08-14
  Administered 2021-08-14: 30 mg via INTRAVENOUS
  Administered 2021-08-14: 70 mg via INTRAVENOUS

## 2021-08-14 MED ORDER — ACETAMINOPHEN 650 MG RE SUPP: 650.0000 mg | RECTAL | Status: DC | PRN

## 2021-08-14 MED ORDER — LACTATED RINGERS IV SOLN: INTRAVENOUS | Status: DC | PRN

## 2021-08-14 MED ORDER — CYCLOBENZAPRINE HCL 10 MG PO TABS
10.0000 mg | ORAL_TABLET | Freq: Three times a day (TID) | ORAL | Status: DC | PRN
  Administered 2021-08-14: 10 mg via ORAL
  Filled 2021-08-14 (×2): qty 1

## 2021-08-14 MED ORDER — THROMBIN 5000 UNITS EX SOLR
CUTANEOUS | Status: AC
  Filled 2021-08-14: qty 5000

## 2021-08-14 MED ORDER — FENTANYL CITRATE (PF) 250 MCG/5ML IJ SOLN
INTRAMUSCULAR | Status: AC
  Filled 2021-08-14: qty 5

## 2021-08-14 SURGICAL SUPPLY — 70 items
ADH SKN CLS APL DERMABOND .7 (GAUZE/BANDAGES/DRESSINGS) ×1
APL SKNCLS STERI-STRIP NONHPOA (GAUZE/BANDAGES/DRESSINGS) ×1
BAG COUNTER SPONGE SURGICOUNT (BAG) ×2 IMPLANT
BAG SPNG CNTER NS LX DISP (BAG) ×1
BASKET BONE COLLECTION (BASKET) ×2 IMPLANT
BENZOIN TINCTURE PRP APPL 2/3 (GAUZE/BANDAGES/DRESSINGS) ×2 IMPLANT
BLADE CLIPPER SURG (BLADE) IMPLANT
BUR MATCHSTICK NEURO 3.0 LAGG (BURR) ×2 IMPLANT
BUR PRECISION FLUTE 6.0 (BURR) ×2 IMPLANT
CAGE ALTERA 10X31X9-13 15D (Cage) ×1 IMPLANT
CANISTER SUCT 3000ML PPV (MISCELLANEOUS) ×2 IMPLANT
CAP REVERE LOCKING (Cap) ×6 IMPLANT
CARTRIDGE OIL MAESTRO DRILL (MISCELLANEOUS) ×1 IMPLANT
CNTNR URN SCR LID CUP LEK RST (MISCELLANEOUS) ×1 IMPLANT
CONT SPEC 4OZ STRL OR WHT (MISCELLANEOUS) ×2
COVER BACK TABLE 60X90IN (DRAPES) ×2 IMPLANT
DECANTER SPIKE VIAL GLASS SM (MISCELLANEOUS) ×2 IMPLANT
DERMABOND ADVANCED (GAUZE/BANDAGES/DRESSINGS) ×1
DERMABOND ADVANCED .7 DNX12 (GAUZE/BANDAGES/DRESSINGS) IMPLANT
DIFFUSER DRILL AIR PNEUMATIC (MISCELLANEOUS) ×2 IMPLANT
DRAPE C-ARM 42X72 X-RAY (DRAPES) ×4 IMPLANT
DRAPE HALF SHEET 40X57 (DRAPES) ×2 IMPLANT
DRAPE LAPAROTOMY 100X72X124 (DRAPES) ×2 IMPLANT
DRAPE SURG 17X23 STRL (DRAPES) ×8 IMPLANT
DRSG OPSITE POSTOP 4X6 (GAUZE/BANDAGES/DRESSINGS) ×2 IMPLANT
DRSG OPSITE POSTOP 4X8 (GAUZE/BANDAGES/DRESSINGS) ×1 IMPLANT
ELECT BLADE 4.0 EZ CLEAN MEGAD (MISCELLANEOUS) ×2
ELECT REM PT RETURN 9FT ADLT (ELECTROSURGICAL) ×2
ELECTRODE BLDE 4.0 EZ CLN MEGD (MISCELLANEOUS) ×1 IMPLANT
ELECTRODE REM PT RTRN 9FT ADLT (ELECTROSURGICAL) ×1 IMPLANT
EVACUATOR 1/8 PVC DRAIN (DRAIN) IMPLANT
GAUZE 4X4 16PLY ~~LOC~~+RFID DBL (SPONGE) ×2 IMPLANT
GLOVE EXAM NITRILE XL STR (GLOVE) IMPLANT
GLOVE SURG ENC MOIS LTX SZ8 (GLOVE) ×4 IMPLANT
GLOVE SURG ENC MOIS LTX SZ8.5 (GLOVE) ×4 IMPLANT
GLOVE SURG LTX SZ7.5 (GLOVE) ×2 IMPLANT
GLOVE SURG UNDER POLY LF SZ7.5 (GLOVE) ×3 IMPLANT
GOWN STRL REUS W/ TWL LRG LVL3 (GOWN DISPOSABLE) IMPLANT
GOWN STRL REUS W/ TWL XL LVL3 (GOWN DISPOSABLE) ×2 IMPLANT
GOWN STRL REUS W/TWL 2XL LVL3 (GOWN DISPOSABLE) ×1 IMPLANT
GOWN STRL REUS W/TWL LRG LVL3 (GOWN DISPOSABLE) ×2
GOWN STRL REUS W/TWL XL LVL3 (GOWN DISPOSABLE) ×6
HEMOSTAT POWDER KIT SURGIFOAM (HEMOSTASIS) ×2 IMPLANT
KIT BASIN OR (CUSTOM PROCEDURE TRAY) ×2 IMPLANT
KIT TURNOVER KIT B (KITS) ×2 IMPLANT
MILL MEDIUM DISP (BLADE) ×1 IMPLANT
NDL HYPO 21X1.5 SAFETY (NEEDLE) IMPLANT
NEEDLE HYPO 21X1.5 SAFETY (NEEDLE) IMPLANT
NEEDLE HYPO 22GX1.5 SAFETY (NEEDLE) ×2 IMPLANT
NS IRRIG 1000ML POUR BTL (IV SOLUTION) ×2 IMPLANT
OIL CARTRIDGE MAESTRO DRILL (MISCELLANEOUS) ×2
PACK LAMINECTOMY NEURO (CUSTOM PROCEDURE TRAY) ×2 IMPLANT
PAD ARMBOARD 7.5X6 YLW CONV (MISCELLANEOUS) ×5 IMPLANT
PATTIES SURGICAL .5 X1 (DISPOSABLE) IMPLANT
PATTIES SURGICAL 1X1 (DISPOSABLE) ×1 IMPLANT
PUTTY DBM 10CC CALC GRAN (Putty) ×2 IMPLANT
ROD REVERE 6.35 CURVED 70MM (Rod) ×2 IMPLANT
SCREW 7.5X50MM (Screw) ×2 IMPLANT
SPONGE NEURO XRAY DETECT 1X3 (DISPOSABLE) IMPLANT
SPONGE SURGIFOAM ABS GEL 100 (HEMOSTASIS) IMPLANT
SPONGE T-LAP 4X18 ~~LOC~~+RFID (SPONGE) ×2 IMPLANT
STRIP CLOSURE SKIN 1/2X4 (GAUZE/BANDAGES/DRESSINGS) ×2 IMPLANT
SUT VIC AB 1 CT1 18XBRD ANBCTR (SUTURE) ×2 IMPLANT
SUT VIC AB 1 CT1 8-18 (SUTURE) ×4
SUT VIC AB 2-0 CP2 18 (SUTURE) ×4 IMPLANT
SYR 20ML LL LF (SYRINGE) ×1 IMPLANT
TOWEL GREEN STERILE (TOWEL DISPOSABLE) ×2 IMPLANT
TOWEL GREEN STERILE FF (TOWEL DISPOSABLE) ×2 IMPLANT
TRAY FOLEY MTR SLVR 16FR STAT (SET/KITS/TRAYS/PACK) ×2 IMPLANT
WATER STERILE IRR 1000ML POUR (IV SOLUTION) ×2 IMPLANT

## 2021-08-14 NOTE — Progress Notes (Signed)
OT Cancellation Note  Patient Details Name: Brandon Fleming MRN: [id number] DOB: Sep 21, 1946   Cancelled Treatment:    Reason Eval/Treat Not Completed: Lunch delivered just prior to entry. OT to check back as time allows.   Gloris Manchester OTR/L Supplemental OT, Department of rehab services [phone]  Ziair Penson R H. 08/14/2021, 2:07 PM

## 2021-08-14 NOTE — Transfer of Care (Signed)
Immediate Anesthesia Transfer of Care Note  Patient: Brandon Fleming  Procedure(s) Performed: PLIF,IP,POSTERIOR INSTRUMENTATION LUMBAR THREE-FOUR, EXPLORE FUSION  Patient Location: PACU  Anesthesia Type:General  Level of Consciousness: awake and alert   Airway & Oxygen Therapy: Patient Spontanous Breathing and Patient connected to nasal cannula oxygen  Post-op Assessment: Report given to RN and Post -op Vital signs reviewed and stable  Post vital signs: Reviewed and stable  Last Vitals:  Vitals Value Taken Time  BP    Temp    Pulse    Resp    SpO2      Last Pain:  Vitals:   08/14/21 0616  TempSrc:   PainSc: 0-No pain         Complications: No notable events documented.

## 2021-08-14 NOTE — H&P (Signed)
Subjective: The patient is a 75 year old white male on whom I previously formed a L4-5 fusion.  He did well until recently when he developed severe right leg pain.  He failed medical management and was worked up with a lumbar MRI and lumbar x-rays which demonstrated L3-4 spondylolisthesis and foraminal stenosis.  I discussed the various treatment options with him.  He decided to proceed with surgery.  Past Medical History:  Diagnosis Date   Alcohol abuse    Anxiety    Depression    Gait abnormality 04/08/2018   GERD (gastroesophageal reflux disease)    Hyperlipidemia    Hypertension    Kidney damage    Memory difficulties 04/08/2018    Past Surgical History:  Procedure Laterality Date   BACK SURGERY     COLONOSCOPY     no polyps   COLONOSCOPY W/ POLYPECTOMY     EYE SURGERY     KNEE SURGERY Left 2011   fracture   LASER PHOTO ABLATION Left 05/18/2018   Procedure: LASER PHOTO ABLATION;  Surgeon: Hayden Pedro, MD;  Location: [facility];  Service: Ophthalmology;  Laterality: Left;   PARS PLANA VITRECTOMY Left 05/18/2018   Procedure: PARS PLANA VITRECTOMY WITH 25G REMOVAL/SUTURE INTRAOCULAR LENS;  Surgeon: Hayden Pedro, MD;  Location: [place];  Service: Ophthalmology;  Laterality: Left;   VITRECTOMY Left 05/18/2018   PARS PLANA VITRECTOMY WITH 25G REMOVAL/SUTURE INTRAOCULAR LENS (Left)    Allergies  Allergen Reactions   Aricept [Donepezil Hcl] Diarrhea and Other (See Comments)    Diarrhea, stomach problems.    Atropine Swelling and Other (See Comments)    Eyes swollen and red    Social History   Tobacco Use   Smoking status: Former    Types: Cigarettes    Quit date: 1978    Years since quitting: 44.6   Smokeless tobacco: Never  Substance Use Topics   Alcohol use: Yes    Comment: 8-10 ounces daily    Family History  Problem Relation Age of Onset   CAD Father    Kidney disease Father    Parkinson's disease Mother    Anesthesia problems Neg Hx    Broken bones Neg Hx     Cancer Neg Hx    Clotting disorder Neg Hx    Collagen disease Neg Hx    Diabetes Neg Hx    Dislocations Neg Hx    Osteoporosis Neg Hx    Rheumatologic disease Neg Hx    Scoliosis Neg Hx    Severe sprains Neg Hx    Prior to Admission medications   Medication Sig Start Date End Date Taking? Authorizing Provider  amLODipine-valsartan (EXFORGE) 10-160 MG tablet Take 1 tablet by mouth daily. 08/28/20  Yes [provider]  fenofibrate (TRICOR) 145 MG tablet Take 145 mg by mouth daily. 02/17/18  Yes [provider]  FLUoxetine (PROZAC) 20 MG capsule TAKE 1 CAPSULE BY MOUTH EVERY DAY 04/18/21  Yes Suzzanne Cloud, NP  folic acid (FOLVITE) 1 MG tablet Take 1 mg by mouth daily.   Yes [provider]  levothyroxine (SYNTHROID) 50 MCG tablet Take 50 mcg by mouth daily before breakfast. 04/09/20  Yes [provider]  memantine (NAMENDA XR) 28 MG CP24 24 hr capsule Take 1 capsule (28 mg total) by mouth daily. 04/23/21  Yes Suzzanne Cloud, NP  mirtazapine (REMERON) 30 MG tablet TAKE 1 TABLET BY MOUTH AT BEDTIME. 04/18/21  Yes Suzzanne Cloud, NP  Multiple Vitamins-Minerals (MULTIVITAMIN WITH  MINERALS) tablet Take 1 tablet by mouth daily.   Yes [provider]  pantoprazole (PROTONIX) 40 MG tablet Take 40 mg by mouth daily.   Yes [provider]  rosuvastatin (CRESTOR) 10 MG tablet Take 10 mg by mouth daily.  12/28/13  Yes [provider]  thiamine 100 MG tablet Take 100 mg by mouth daily.   Yes [provider]  traMADol (ULTRAM) 50 MG tablet Take 50 mg by mouth 3 (three) times daily as needed for severe pain.   Yes [provider]  ondansetron (ZOFRAN-ODT) 4 MG disintegrating tablet Take 4 mg by mouth every 8 (eight) hours as needed for nausea or vomiting. 10/01/20   [provider]     Review of Systems  Positive ROS: As above  All other systems have been reviewed and were otherwise negative with the exception of those  mentioned in the HPI and as above.  Objective: Vital signs in last 24 hours: Temp:  [98 F (36.7 C)] 98 F (36.7 C) (08/31 0552) Pulse Rate:  [81] 81 (08/31 0552) Resp:  [18] 18 (08/31 0552) BP: (141)/(74) 141/74 (08/31 0552) SpO2:  [99 %] 99 % (08/31 0552) Weight:  [68 kg-72.6 kg] 68 kg (08/31 0553) Estimated body mass index is 24.21 kg/m as calculated from the following:   Height as of this encounter: '5\' 6"'$  (1.676 m).   Weight as of this encounter: 68 kg.   General Appearance: Alert Head: Normocephalic, without obvious abnormality, atraumatic Eyes: PERRL, conjunctiva/corneas clear, EOM's intact,    Ears: Normal  Throat: Normal  Neck: Supple, Back: The patient's lumbar incision is well-healed. Lungs: Clear to auscultation bilaterally, respirations unlabored Heart: Regular rate and rhythm, no murmur, rub or gallop Abdomen: Soft, non-tender Extremities: Extremities normal, atraumatic, no cyanosis or edema Skin: unremarkable  NEUROLOGIC:   Mental status: alert and oriented,Motor Exam - grossly normal Sensory Exam - grossly normal Reflexes:  Coordination - grossly normal Gait - grossly normal Balance - grossly normal Cranial Nerves: I: smell Not tested  II: visual acuity  OS: Normal  OD: Normal   II: visual fields Full to confrontation  II: pupils Equal, round, reactive to light  III,VII: ptosis None  III,IV,VI: extraocular muscles  Full ROM  V: mastication Normal  V: facial light touch sensation  Normal  V,VII: corneal reflex  Present  VII: facial muscle function - upper  Normal  VII: facial muscle function - lower Normal  VIII: hearing Not tested  IX: soft palate elevation  Normal  IX,X: gag reflex Present  XI: trapezius strength  5/5  XI: sternocleidomastoid strength 5/5  XI: neck flexion strength  5/5  XII: tongue strength  Normal    Data Review Lab Results  Component Value Date   WBC 4.2 08/05/2021   HGB 11.3 (L) 08/05/2021   HCT 34.1 (L) 08/05/2021    MCV 99.1 08/05/2021   PLT 217 08/05/2021   Lab Results  Component Value Date   NA 141 05/06/2018   K 3.4 (L) 05/06/2018   CL 107 05/06/2018   CO2 25 05/06/2018   BUN 17 05/06/2018   CREATININE 1.18 05/06/2018   GLUCOSE 122 (H) 05/06/2018   Lab Results  Component Value Date   INR 1.06 03/31/2018    Assessment/Plan: L3-4 spondylolisthesis, foraminal stenosis, lumbar radiculopathy, lumbago: I have discussed the situation with the patient.  I reviewed his imaging studies with him and pointed out the abnormalities.  We have discussed the various treatment options including surgery.  I have described the surgical treatment option of an exploration with lumbar fusion and L3-4 decompression, instrumentation and fusion.  I have given him a surgical pamphlet.  I have shown him surgical models.  We have discussed the risk, benefits, alternatives, expected postop course, and likelihood of achieving our goals with surgery.  I have answered all his questions.  He has decided to proceed with surgery.   Ophelia Charter 08/14/2021 7:20 AM

## 2021-08-14 NOTE — Op Note (Signed)
Brief history: The patient is a 75 year old white male on whom I performed an L4-5 fusion in 2019.  He has developed recurrent back and right greater left leg pain consistent with a lumbar radiculopathy.  He failed medical management and was worked up with a lumbar MRI and lumbar x-rays which demonstrated an L3-4 spondylolisthesis and spinal foraminal stenosis.  I discussed the various treatment options with him.  He has decided to proceed with surgery.  Preoperative diagnosis: L3-4 spondylolisthesis, foraminal stenosis, facet arthropathy, degenerative disc disease, spinal stenosis compressing both the L3 and the L4 nerve roots; lumbago; lumbar radiculopathy; neurogenic claudication  Postoperative diagnosis: The same and L4-5 pseudoarthrosis   Procedure: Bilateral L3-4 laminotomy/foraminotomies/medial facetectomy to decompress the bilateral L3 and L4 nerve roots(the work required to do this was in addition to the work required to do the posterior lumbar interbody fusion because of the patient's spinal stenosis, facet arthropathy. Etc. requiring a wide decompression of the nerve roots.);  Right L3-4 transforaminal lumbar interbody fusion with local morselized autograft bone and Zimmer DBM; insertion of interbody prosthesis at L3-4 (globus peek expandable interbody prosthesis); posterior segmental instrumentation from L3 to L5 with globus titanium pedicle screws and rods; posterior lateral arthrodesis at L3-4 and redo posterior lateral arthrodesis at L4-5 with local morselized autograft bone and Zimmer DBM; exploration of lumbar fusion.  Surgeon: Dr. Earle Gell  Asst.: Dr. Duffy Rhody and Arnetha Massy, NP  Anesthesia: Gen. endotracheal  Estimated blood loss: 150 cc  Drains: None  Complications: None  Description of procedure: The patient was brought to the operating room by the anesthesia team. General endotracheal anesthesia was induced. The patient was turned to the prone position on the  Wilson frame. The patient's lumbosacral region was then prepared with Betadine scrub and Betadine solution. Sterile drapes were applied.  I then injected the area to be incised with Marcaine with epinephrine solution. I then used the scalpel to make a linear midline incision over the L3-4 and L4-5 interspace, incising through the old surgical scar. I then used electrocautery to perform a bilateral subperiosteal dissection exposing the spinous process and lamina of L3, L4 and L5 and exposing the old hardware at L4-5. We then inserted the Verstrac retractor to provide exposure.  We explored the fusion by removing the caps from the old screws at L4-5 and then removing the rods.  I inspected the posterior lateral thesis.  I could not identify a solid arthrodesis.  I began the decompression by using the high speed drill to perform laminotomies at L3-4 bilaterally. We then used the Kerrison punches to widen the laminotomy and removed the ligamentum flavum at L3-4 bilaterally. We used the Kerrison punches to remove the medial facets at L3-4 bilaterally, we removed the right L3-4 facet. We performed wide foraminotomies about the bilateral L3 and L4 nerve roots completing the decompression.  We now turned our attention to the posterior lumbar interbody fusion. I used a scalpel to incise the intervertebral disc at L3-4 bilaterally. I then performed a partial intervertebral discectomy at L3-4 bilaterally using the pituitary forceps. We prepared the vertebral endplates at [id number] bilaterally for the fusion by removing the soft tissues with the curettes. We then used the trial spacers to pick the appropriate sized interbody prosthesis. We prefilled his prosthesis with a combination of local morselized autograft bone that we obtained during the decompression as well as Zimmer DBM. We inserted the prefilled prosthesis into the interspace at L3-4 from the right, we then turned and expanded the  prosthesis. There was a good snug  fit of the prosthesis in the interspace. We then filled and the remainder of the intervertebral disc space with local morselized autograft bone and Zimmer DBM. This completed the posterior lumbar interbody arthrodesis.  During the decompression and insertion of the prosthesis the assistant protected the thecal sac and nerve roots with the D'Errico retractor.  We now turned attention to the instrumentation. Under fluoroscopic guidance we cannulated the bilateral L3 pedicles with the bone probe. We then removed the bone probe. We then tapped the pedicle with a 6.5 millimeter tap. We then removed the tap. We probed inside the tapped pedicle with a ball probe to rule out cortical breaches. We then inserted a 7.5 x 50 millimeter pedicle screw into the L3 pedicles bilaterally under fluoroscopic guidance. We then palpated along the medial aspect of the pedicles to rule out cortical breaches. There were none. The nerve roots were not injured. We then connected the unilateral pedicle screws with a lordotic rod. We compressed the construct and secured the rod in place with the caps. We then tightened the caps appropriately. This completed the instrumentation from L3-L5 bilaterally.  We now turned our attention to the posterior lateral arthrodesis at L3-4 and the redo posterior lateral arthrodesis at L4-5.  We used electrocautery to clear the soft tissue and scar tissue from the facets at L4-5, and to expose the facets at L3-4 we used the high-speed drill to decorticate the remainder of the facets, pars, transverse process at L3-4 and L4-5. We then applied a combination of local morselized autograft bone and Zimmer DBM over these decorticated posterior lateral structures. This completed the posterior lateral arthrodesis.  We then obtained hemostasis using bipolar electrocautery. We irrigated the wound out with bacitracin solution. We inspected the thecal sac and nerve roots and noted they were well decompressed. We then  removed the retractor.  We injected Exparel . We reapproximated patient's thoracolumbar fascia with interrupted #1 Vicryl suture. We reapproximated patient's subcutaneous tissue with interrupted 2-0 Vicryl suture. The reapproximated patient's skin with Steri-Strips and benzoin. The wound was then coated with bacitracin ointment. A sterile dressing was applied. The drapes were removed. The patient was subsequently returned to the supine position where they were extubated by the anesthesia team. He was then transported to the post anesthesia care unit in stable condition. All sponge instrument and needle counts were reportedly correct at the end of this case.

## 2021-08-14 NOTE — Progress Notes (Signed)
Orthopedic Tech Progress Note Patient Details:  Brandon Fleming 17-Feb-1946 [id number] RN stated patient has back brace  Patient ID: KIAI PEACE, male   DOB: [birth year], 75 y.o.   MRN: [id number]  [name] 08/14/2021, 2:22 PM

## 2021-08-14 NOTE — Anesthesia Procedure Notes (Signed)

## 2021-08-14 NOTE — Anesthesia Postprocedure Evaluation (Signed)
Anesthesia Post Note  Patient: JAQUI OFTEDAHL  Procedure(s) Performed: PLIF,IP,POSTERIOR INSTRUMENTATION LUMBAR THREE-FOUR, EXPLORE FUSION     Patient location during evaluation: PACU Anesthesia Type: General Level of consciousness: awake and alert Pain management: pain level controlled Vital Signs Assessment: post-procedure vital signs reviewed and stable Respiratory status: spontaneous breathing, nonlabored ventilation and respiratory function stable Cardiovascular status: blood pressure returned to baseline and stable Postop Assessment: no apparent nausea or vomiting Anesthetic complications: no   No notable events documented.  Last Vitals:  Vitals:   08/14/21 1250 08/14/21 1313  BP: 126/73 134/74  Pulse: 72 73  Resp: 10 18  Temp: 36.6 C 36.5 C  SpO2: 94% 100%    Last Pain:  Vitals:   08/14/21 1350  TempSrc:   PainSc: 10-Worst pain ever                 Lidia Collum

## 2021-08-15 LAB — CBC
HCT: 29.8 % — ABNORMAL LOW (ref 39.0–52.0)
Hemoglobin: 10 g/dL — ABNORMAL LOW (ref 13.0–17.0)
MCH: 33.4 pg (ref 26.0–34.0)
MCHC: 33.6 g/dL (ref 30.0–36.0)
MCV: 99.7 fL (ref 80.0–100.0)
Platelets: 172 10*3/uL (ref 150–400)
RBC: 2.99 MIL/uL — ABNORMAL LOW (ref 4.22–5.81)
RDW: 12.4 % (ref 11.5–15.5)
WBC: 7.1 10*3/uL (ref 4.0–10.5)
nRBC: 0 % (ref 0.0–0.2)

## 2021-08-15 LAB — BASIC METABOLIC PANEL
Anion gap: 9 (ref 5–15)
BUN: 17 mg/dL (ref 8–23)
CO2: 23 mmol/L (ref 22–32)
Calcium: 8.4 mg/dL — ABNORMAL LOW (ref 8.9–10.3)
Chloride: 103 mmol/L (ref 98–111)
Creatinine, Ser: 1.71 mg/dL — ABNORMAL HIGH (ref 0.61–1.24)
GFR, Estimated: 41 mL/min — ABNORMAL LOW (ref >60)
Glucose, Bld: 124 mg/dL — ABNORMAL HIGH (ref 70–99)
Potassium: 3.8 mmol/L (ref 3.5–5.1)
Sodium: 135 mmol/L (ref 135–145)

## 2021-08-15 MED ORDER — CYCLOBENZAPRINE HCL 10 MG PO TABS: 10.0000 mg | ORAL_TABLET | Freq: Three times a day (TID) | ORAL | 0 refills | Status: DC | PRN

## 2021-08-15 MED ORDER — SODIUM CHLORIDE 0.9 % IV BOLUS
1000.0000 mL | Freq: Once | INTRAVENOUS | Status: AC
  Administered 2021-08-15: 1000 mL via INTRAVENOUS

## 2021-08-15 MED ORDER — OXYCODONE HCL 10 MG PO TABS: 10.0000 mg | ORAL_TABLET | ORAL | 0 refills | Status: DC | PRN

## 2021-08-15 MED ORDER — DOCUSATE SODIUM 100 MG PO CAPS: 100.0000 mg | ORAL_CAPSULE | Freq: Two times a day (BID) | ORAL | 0 refills | Status: AC

## 2021-08-15 NOTE — Evaluation (Signed)
Occupational Therapy Evaluation Patient Details Name: Brandon Fleming MRN: [id number] DOB: 06-24-1946 Today's Date: 08/15/2021    History of Present Illness 75 yo M s/p PLIF.  PMH includes: Alcohol abuse, Anxiety, Hyperlipidemia, Hypertension, Memory difficulties.   Clinical Impression   Patient admitted for the procedure above.  Patient's BP has been low with symptomatic dizziness.  OT to continue efforts in the acute setting to ensure compliance with ADL and in room mobility given back precautions.  No anticipated post acute rehab needs.  Spouse will be at the house to assist.      Follow Up Recommendations  No OT follow up;Supervision - Intermittent    Equipment Recommendations  None recommended by OT    Recommendations for Other Services       Precautions / Restrictions Precautions Precautions: Back Precaution Booklet Issued: No Required Braces or Orthoses: Spinal Brace Spinal Brace: Lumbar corset Restrictions Weight Bearing Restrictions: No      Mobility Bed Mobility Overal bed mobility: Needs Assistance Bed Mobility: Sidelying to Sit;Sit to Sidelying   Sidelying to sit: Supervision     Sit to sidelying: Supervision   Patient Response: Restless  Transfers Overall transfer level: Needs assistance Equipment used: Rolling walker (2 wheeled) Transfers: Sit to/from Stand Sit to Stand: Min guard              Balance Overall balance assessment: Needs assistance Sitting-balance support: Feet supported Sitting balance-Leahy Scale: Good     Standing balance support: Bilateral upper extremity supported Standing balance-Leahy Scale: Fair Standing balance comment: able to static stand without RW                           ADL either performed or assessed with clinical judgement   ADL                           Toilet Transfer: Designer, television/film set Details (indicate cue type and reason): BP is low with symptomatic dizziness                  Vision Baseline Vision/History: 0 No visual deficits Ability to See in Adequate Light: 0 Adequate Patient Visual Report: No change from baseline       Perception     Praxis      Pertinent Vitals/Pain Pain Assessment: Faces Faces Pain Scale: Hurts even more Pain Location: low back Pain Descriptors / Indicators: Operative site guarding;Grimacing;Guarding;Sharp Pain Intervention(s): Monitored during session     Hand Dominance Right   Extremity/Trunk Assessment Upper Extremity Assessment Upper Extremity Assessment: Overall WFL for tasks assessed   Lower Extremity Assessment Lower Extremity Assessment: Defer to PT evaluation   Cervical / Trunk Assessment Cervical / Trunk Assessment: Other exceptions Cervical / Trunk Exceptions: back surgery   Communication Communication Communication: No difficulties   Cognition Arousal/Alertness: Awake/alert Behavior During Therapy: Restless Overall Cognitive Status: Within Functional Limits for tasks assessed                                     General Comments   BP 74/48    Exercises     Shoulder Instructions      Home Living Family/patient expects to be discharged to:: Private residence Living Arrangements: Spouse/significant other Available Help at Discharge: Family;Available 24 hours/day Type of Home: House Home Access: Stairs to enter Entrance  Stairs-Number of Steps: 1 Entrance Stairs-Rails: Right;Left Home Layout: Multi-level;Able to live on main level with bedroom/bathroom Alternate Level Stairs-Number of Steps: flight - his BR is upstairs, but can stay in the downstairs BR   Bathroom Shower/Tub: Walk-in shower   Bathroom Toilet: Standard Bathroom Accessibility: Yes How Accessible: Accessible via walker Home Equipment: [place] - 2 wheels;Wheelchair - manual;Shower seat;Grab bars - tub/shower;Hand held shower head          Prior Functioning/Environment Level of Independence:  Independent        Comments: Continues to drive, and able to perfrom ADL/IADL without assist        OT Problem List: Impaired balance (sitting and/or standing);Pain      OT Treatment/Interventions: Self-care/ADL training;Therapeutic activities    OT Goals(Current goals can be found in the care plan section) Acute Rehab OT Goals Patient Stated Goal: Hoping to return home OT Goal Formulation: With patient Time For Goal Achievement: 08/29/21 Potential to Achieve Goals: Good ADL Goals Pt Will Perform Grooming: Independently;standing Pt Will Perform Lower Body Dressing: with modified independence;sit to/from stand Pt Will Transfer to Toilet: with modified independence;ambulating;regular height toilet  OT Frequency: Min 2X/week   Barriers to D/C:    none noted       Co-evaluation              AM-PAC OT "6 Clicks" Daily Activity     Outcome Measure Help from another person eating meals?: None Help from another person taking care of personal grooming?: None Help from another person toileting, which includes using toliet, bedpan, or urinal?: A Little Help from another person bathing (including washing, rinsing, drying)?: A Little Help from another person to put on and taking off regular upper body clothing?: None Help from another person to put on and taking off regular lower body clothing?: A Little 6 Click Score: 21   End of Session Equipment Utilized During Treatment: Rolling walker Nurse Communication: Mobility status  Activity Tolerance: Patient tolerated treatment well Patient left: in bed;with call bell/phone within reach  OT Visit Diagnosis: Unsteadiness on feet (R26.81);Pain;Dizziness and giddiness (R42)                Time: 0840-0900 OT Time Calculation (min): 20 min Charges:  OT General Charges $OT Visit: 1 Visit OT Evaluation $OT Eval Moderate Complexity: 1 Mod  08/15/2021  RP, OTR/L  Acute Rehabilitation Services  Office:   [phone]   Metta Clines 08/15/2021, 9:11 AM

## 2021-08-15 NOTE — Discharge Instructions (Addendum)
Wound Care Keep incision covered and dry for two days.    Do not put any creams, lotions, or ointments on incision. Leave steri-strips on back.  They will fall off by themselves.  Activity Walk each and every day, increasing distance each day. No lifting greater than 5 lbs. No driving for 2 weeks; may ride as a passenger locally.  Diet Resume your normal diet.   Return to Work Will be discussed at your follow up appointment.  Call Your Doctor If Any of These Occur Redness, drainage, or swelling at the wound.  Temperature greater than 101 degrees. Severe pain not relieved by pain medication. Incision starts to come apart.  Follow Up Appt Call today for appointment in 3 weeks [phone]) or for problems.  If you have any hardware placed in your spine, you will need an x-ray before your appointment.

## 2021-08-15 NOTE — Progress Notes (Signed)
PT Cancellation Note  Patient Details Name: Brandon Fleming MRN: [id number] DOB: [birth year]   Cancelled Treatment:    Reason Eval/Treat Not Completed: Patient declined, no reason specified. Pt agitated at time of PT evaluation attempt, yelling and cussing. He asked that PT leave. I am available to return if the pt chooses. Page me at the number listed below if so.    Thelma Comp 08/15/2021, 11:41 AM  Rolinda Roan, PT, DPT Acute Rehabilitation Services Pager: [phone] Office: [phone]

## 2021-08-15 NOTE — Discharge Summary (Signed)
Physician Discharge Summary     Providing Compassionate, Quality Care - Together   Patient ID: Brandon Fleming MRN: [id number] DOB/AGE: July 24, 1946 75 y.o.  Admit date: 08/14/2021 Discharge date: 08/15/2021  Admission Diagnoses: Lumbar adjacent segment disease with spondylolisthesis  Discharge Diagnoses:  Active Problems:   Lumbar adjacent segment disease with spondylolisthesis   Discharged Condition: good  Hospital Course: Patient underwent an L3-4 PLIF with hardware revision at L4-5 by Dr. Arnoldo Morale on 08/14/2021. He was admitted to 3C07 following recovery from anesthesia in the PACU. His postoperative course has been uncomplicated. He has worked with both physical and occupational therapies who feel the patient is ready for discharge home. He is ambulating independently and without difficulty. He is tolerating a normal diet. He is not having any bowel or bladder dysfunction. His pain is well-controlled with oral pain medication. He is ready for discharge home.   Consults: None  Significant Diagnostic Studies: radiology: DG Lumbar Spine 2-3 Views  Result Date: 08/14/2021 CLINICAL DATA:  Posterior lumbar fusion L3-4 EXAM: LUMBAR SPINE - 2-3 VIEW COMPARISON:  05/01/2020 FINDINGS: PA and lateral C-arm images obtained the lumbar spine Pre-existing pedicle screws and interbody fusion L4-5. Interval placement of bilateral screws at L3 with interbody spacer at L3-4. Posterior rods not in place. Hardware appears to be in satisfactory position. IMPRESSION: PLIF L3-4 and L4-5. Electronically Signed   By: Franchot Gallo M.D.   On: 08/14/2021 13:42   DG C-Arm 1-60 Min-No Report  Result Date: 08/14/2021 Fluoroscopy was utilized by the requesting physician.  No radiographic interpretation.     Treatments: surgery: Bilateral L3-4 laminotomy/foraminotomies/medial facetectomy to decompress the bilateral L3 and L4 nerve roots(the work required to do this was in addition to the work required to do the  posterior lumbar interbody fusion because of the patient's spinal stenosis, facet arthropathy. Etc. requiring a wide decompression of the nerve roots.);  Right L3-4 transforaminal lumbar interbody fusion with local morselized autograft bone and Zimmer DBM; insertion of interbody prosthesis at L3-4 (globus peek expandable interbody prosthesis); posterior segmental instrumentation from L3 to L5 with globus titanium pedicle screws and rods; posterior lateral arthrodesis at L3-4 and redo posterior lateral arthrodesis at L4-5 with local morselized autograft bone and Zimmer DBM; exploration of lumbar fusion.    Discharge Exam: Blood pressure (!) 101/53, pulse 80, temperature 98.1 F (36.7 C), temperature source Oral, resp. rate 18, height '5\' 6"'$  (1.676 m), weight 68 kg, SpO2 99 %.  Per report: Alert and oriented x 4 PERRLA CN II-XII grossly intact MAE, Strength and sensation intact Incision is covered with Honeycomb dressing and Steri Strips; Dressing is clean, dry, and intact   Disposition: Discharge disposition: 01-Home or Self Care        Allergies as of 08/15/2021       Reactions   Aricept [donepezil Hcl] Diarrhea, Other (See Comments)   Diarrhea, stomach problems.    Atropine Swelling, Other (See Comments)   Eyes swollen and red        Medication List     STOP taking these medications    traMADol 50 MG tablet Commonly known as: ULTRAM       TAKE these medications    amLODipine-valsartan 10-160 MG tablet Commonly known as: EXFORGE Take 1 tablet by mouth daily.   cyclobenzaprine 10 MG tablet Commonly known as: FLEXERIL Take 1 tablet (10 mg total) by mouth 3 (three) times daily as needed for muscle spasms.   docusate sodium 100 MG capsule Commonly known as: COLACE  Take 1 capsule (100 mg total) by mouth 2 (two) times daily.   fenofibrate 145 MG tablet Commonly known as: TRICOR Take 145 mg by mouth daily.   FLUoxetine 20 MG capsule Commonly known as:  PROZAC TAKE 1 CAPSULE BY MOUTH EVERY DAY   folic acid 1 MG tablet Commonly known as: FOLVITE Take 1 mg by mouth daily.   levothyroxine 50 MCG tablet Commonly known as: SYNTHROID Take 50 mcg by mouth daily before breakfast.   memantine 28 MG Cp24 24 hr capsule Commonly known as: NAMENDA XR Take 1 capsule (28 mg total) by mouth daily.   mirtazapine 30 MG tablet Commonly known as: REMERON TAKE 1 TABLET BY MOUTH AT BEDTIME.   multivitamin with minerals tablet Take 1 tablet by mouth daily.   ondansetron 4 MG disintegrating tablet Commonly known as: ZOFRAN-ODT Take 4 mg by mouth every 8 (eight) hours as needed for nausea or vomiting.   Oxycodone HCl 10 MG Tabs Take 1 tablet (10 mg total) by mouth every 4 (four) hours as needed for severe pain ((score 7 to 10)).   pantoprazole 40 MG tablet Commonly known as: PROTONIX Take 40 mg by mouth daily.   rosuvastatin 10 MG tablet Commonly known as: CRESTOR Take 10 mg by mouth daily.   thiamine 100 MG tablet Take 100 mg by mouth daily.        Follow-up Information     Newman Pies, MD. Schedule an appointment as soon as possible for a visit in 3 week(s).   Specialty: Neurosurgery Contact information: [address] [place] [address] [phone]                 Signed: Viona Gilmore, DNP, AGNP-C Nurse Practitioner  [facility] [place] [address], [place], [place] [address] P: [phone]    F: [phone]  08/15/2021, 11:51 AM

## 2021-08-15 NOTE — Plan of Care (Signed)
Pt and wife given D/C instructions with verbal understanding. Rx's were sent to the pharmacy by MD. Pt's incision is clean and dry with no sign of infection. Pt's IV was removed prior to D/C. Pt D/C'd home via wheelchair per MD order. Pt is stable @ D/C and has no other needs at this time. Holli Humbles, RN

## 2021-08-17 MED FILL — Heparin Sodium (Porcine) Inj 1000 Unit/ML: INTRAMUSCULAR | Qty: 30 | Status: AC

## 2021-08-17 MED FILL — Sodium Chloride IV Soln 0.9%: INTRAVENOUS | Qty: 1000 | Status: AC

## 2021-08-26 ENCOUNTER — Other Ambulatory Visit: Payer: Self-pay | Admitting: Neurosurgery

## 2021-08-26 ENCOUNTER — Other Ambulatory Visit: Payer: Self-pay

## 2021-08-26 ENCOUNTER — Encounter (HOSPITAL_COMMUNITY)
Admission: RE | Disposition: A | Discharge status: Home Health | Payer: Self-pay | Source: Home / Self Care | Attending: Neurosurgery

## 2021-08-26 ENCOUNTER — Inpatient Hospital Stay (HOSPITAL_COMMUNITY): Payer: Medicare Other | Admitting: Certified Registered Nurse Anesthetist

## 2021-08-26 ENCOUNTER — Inpatient Hospital Stay (HOSPITAL_COMMUNITY)
Admission: RE | Admit: 2021-08-26 | Discharge: 2021-08-31 | DRG: 863 | Disposition: A | Discharge status: Home Health | Payer: Medicare Other | Attending: Neurosurgery | Admitting: Neurosurgery

## 2021-08-26 DIAGNOSIS — T361X5A Adverse effect of cephalosporins and other beta-lactam antibiotics, initial encounter: Secondary | ICD-10-CM | POA: Diagnosis not present

## 2021-08-26 DIAGNOSIS — K219 Gastro-esophageal reflux disease without esophagitis: Secondary | ICD-10-CM | POA: Diagnosis present

## 2021-08-26 DIAGNOSIS — Z981 Arthrodesis status: Secondary | ICD-10-CM | POA: Diagnosis not present

## 2021-08-26 DIAGNOSIS — T8130XA Disruption of wound, unspecified, initial encounter: Secondary | ICD-10-CM | POA: Diagnosis present

## 2021-08-26 DIAGNOSIS — I10 Essential (primary) hypertension: Secondary | ICD-10-CM | POA: Diagnosis not present

## 2021-08-26 DIAGNOSIS — T8141XA Infection following a procedure, superficial incisional surgical site, initial encounter: Secondary | ICD-10-CM | POA: Diagnosis not present

## 2021-08-26 DIAGNOSIS — Z20822 Contact with and (suspected) exposure to covid-19: Secondary | ICD-10-CM | POA: Diagnosis not present

## 2021-08-26 DIAGNOSIS — L7682 Other postprocedural complications of skin and subcutaneous tissue: Secondary | ICD-10-CM | POA: Diagnosis present

## 2021-08-26 DIAGNOSIS — T847XXA Infection and inflammatory reaction due to other internal orthopedic prosthetic devices, implants and grafts, initial encounter: Secondary | ICD-10-CM | POA: Diagnosis present

## 2021-08-26 DIAGNOSIS — L270 Generalized skin eruption due to drugs and medicaments taken internally: Secondary | ICD-10-CM | POA: Diagnosis not present

## 2021-08-26 DIAGNOSIS — Z87891 Personal history of nicotine dependence: Secondary | ICD-10-CM | POA: Diagnosis not present

## 2021-08-26 DIAGNOSIS — Z8249 Family history of ischemic heart disease and other diseases of the circulatory system: Secondary | ICD-10-CM

## 2021-08-26 DIAGNOSIS — Z888 Allergy status to other drugs, medicaments and biological substances status: Secondary | ICD-10-CM

## 2021-08-26 DIAGNOSIS — T8142XA Infection following a procedure, deep incisional surgical site, initial encounter: Secondary | ICD-10-CM | POA: Diagnosis not present

## 2021-08-26 DIAGNOSIS — E785 Hyperlipidemia, unspecified: Secondary | ICD-10-CM | POA: Diagnosis present

## 2021-08-26 DIAGNOSIS — Y838 Other surgical procedures as the cause of abnormal reaction of the patient, or of later complication, without mention of misadventure at the time of the procedure: Secondary | ICD-10-CM | POA: Diagnosis present

## 2021-08-26 HISTORY — PX: LUMBAR WOUND DEBRIDEMENT: SHX1988

## 2021-08-26 LAB — CBC
HCT: 29.5 % — ABNORMAL LOW (ref 39.0–52.0)
Hemoglobin: 9.9 g/dL — ABNORMAL LOW (ref 13.0–17.0)
MCH: 32.8 pg (ref 26.0–34.0)
MCHC: 33.6 g/dL (ref 30.0–36.0)
MCV: 97.7 fL (ref 80.0–100.0)
Platelets: 230 10*3/uL (ref 150–400)
RBC: 3.02 MIL/uL — ABNORMAL LOW (ref 4.22–5.81)
RDW: 12 % (ref 11.5–15.5)
WBC: 10.3 10*3/uL (ref 4.0–10.5)
nRBC: 0 % (ref 0.0–0.2)

## 2021-08-26 LAB — SARS CORONAVIRUS 2 BY RT PCR (HOSPITAL ORDER, PERFORMED IN ~~LOC~~ HOSPITAL LAB): SARS Coronavirus 2: NEGATIVE

## 2021-08-26 SURGERY — LUMBAR WOUND DEBRIDEMENT
Anesthesia: General | Site: Back

## 2021-08-26 MED ORDER — VANCOMYCIN HCL 1000 MG IV SOLR
INTRAVENOUS | Status: DC | PRN
  Administered 2021-08-26 (×2): 1000 mg

## 2021-08-26 MED ORDER — FLEET ENEMA 7-19 GM/118ML RE ENEM: 1.0000 | ENEMA | Freq: Once | RECTAL | Status: DC | PRN

## 2021-08-26 MED ORDER — VANCOMYCIN HCL 1000 MG IV SOLR
INTRAVENOUS | Status: AC
  Filled 2021-08-26: qty 20

## 2021-08-26 MED ORDER — PHENOL 1.4 % MT LIQD: 1.0000 | OROMUCOSAL | Status: DC | PRN

## 2021-08-26 MED ORDER — SODIUM CHLORIDE 0.9 % IV SOLN
INTRAVENOUS | Status: DC
Start: 2021-08-26 — End: 2021-08-31

## 2021-08-26 MED ORDER — SUGAMMADEX SODIUM 200 MG/2ML IV SOLN
INTRAVENOUS | Status: DC | PRN
  Administered 2021-08-26: 272 mg via INTRAVENOUS

## 2021-08-26 MED ORDER — FENTANYL CITRATE (PF) 100 MCG/2ML IJ SOLN
INTRAMUSCULAR | Status: AC
  Administered 2021-08-26: 50 ug
  Filled 2021-08-26: qty 2

## 2021-08-26 MED ORDER — OXYCODONE HCL 5 MG PO TABS: 5.0000 mg | ORAL_TABLET | ORAL | Status: DC | PRN

## 2021-08-26 MED ORDER — OXYCODONE HCL 5 MG PO TABS
10.0000 mg | ORAL_TABLET | ORAL | Status: DC | PRN
  Administered 2021-08-26 – 2021-08-31 (×25): 10 mg via ORAL
  Filled 2021-08-26 (×26): qty 2

## 2021-08-26 MED ORDER — DOCUSATE SODIUM 100 MG PO CAPS
100.0000 mg | ORAL_CAPSULE | Freq: Two times a day (BID) | ORAL | Status: DC
  Administered 2021-08-26 – 2021-08-31 (×10): 100 mg via ORAL
  Filled 2021-08-26 (×10): qty 1

## 2021-08-26 MED ORDER — ONDANSETRON HCL 4 MG PO TABS: 4.0000 mg | ORAL_TABLET | Freq: Four times a day (QID) | ORAL | Status: DC | PRN

## 2021-08-26 MED ORDER — LIDOCAINE 2% (20 MG/ML) 5 ML SYRINGE
INTRAMUSCULAR | Status: DC | PRN
  Administered 2021-08-26: 60 mg via INTRAVENOUS

## 2021-08-26 MED ORDER — CYCLOBENZAPRINE HCL 10 MG PO TABS: 10.0000 mg | ORAL_TABLET | Freq: Three times a day (TID) | ORAL | Status: DC | PRN

## 2021-08-26 MED ORDER — BACITRACIN ZINC 500 UNIT/GM EX OINT
TOPICAL_OINTMENT | CUTANEOUS | Status: DC | PRN
  Administered 2021-08-26: 1 via TOPICAL

## 2021-08-26 MED ORDER — ONDANSETRON HCL 4 MG/2ML IJ SOLN: 4.0000 mg | Freq: Four times a day (QID) | INTRAMUSCULAR | Status: DC | PRN

## 2021-08-26 MED ORDER — CHLORHEXIDINE GLUCONATE CLOTH 2 % EX PADS: 6.0000 | MEDICATED_PAD | Freq: Once | CUTANEOUS | Status: DC

## 2021-08-26 MED ORDER — DIPHENHYDRAMINE HCL 50 MG/ML IJ SOLN
12.5000 mg | Freq: Once | INTRAMUSCULAR | Status: DC
  Administered 2021-08-26: 12.5 mg via INTRAVENOUS

## 2021-08-26 MED ORDER — ORAL CARE MOUTH RINSE: 15.0000 mL | Freq: Once | OROMUCOSAL | Status: AC

## 2021-08-26 MED ORDER — MIDAZOLAM HCL 2 MG/2ML IJ SOLN
INTRAMUSCULAR | Status: AC
  Filled 2021-08-26: qty 2

## 2021-08-26 MED ORDER — FOLIC ACID 1 MG PO TABS
1.0000 mg | ORAL_TABLET | Freq: Every day | ORAL | Status: DC
  Administered 2021-08-27 – 2021-08-31 (×5): 1 mg via ORAL
  Filled 2021-08-26 (×5): qty 1

## 2021-08-26 MED ORDER — ROCURONIUM BROMIDE 10 MG/ML (PF) SYRINGE
PREFILLED_SYRINGE | INTRAVENOUS | Status: DC | PRN
  Administered 2021-08-26: 50 mg via INTRAVENOUS

## 2021-08-26 MED ORDER — MEMANTINE HCL ER 28 MG PO CP24
28.0000 mg | ORAL_CAPSULE | Freq: Every day | ORAL | Status: DC
  Administered 2021-08-27 – 2021-08-31 (×5): 28 mg via ORAL
  Filled 2021-08-26 (×5): qty 1

## 2021-08-26 MED ORDER — FENTANYL CITRATE (PF) 250 MCG/5ML IJ SOLN
INTRAMUSCULAR | Status: AC
  Filled 2021-08-26: qty 5

## 2021-08-26 MED ORDER — DEXAMETHASONE SODIUM PHOSPHATE 4 MG/ML IJ SOLN
INTRAMUSCULAR | Status: DC | PRN
  Administered 2021-08-26: 8 mg via INTRAVENOUS

## 2021-08-26 MED ORDER — VANCOMYCIN HCL 750 MG/150ML IV SOLN: 750.0000 mg | INTRAVENOUS | Status: DC

## 2021-08-26 MED ORDER — ACETAMINOPHEN 500 MG PO TABS
1000.0000 mg | ORAL_TABLET | Freq: Four times a day (QID) | ORAL | Status: AC
  Administered 2021-08-26 – 2021-08-27 (×4): 1000 mg via ORAL
  Filled 2021-08-26 (×4): qty 2

## 2021-08-26 MED ORDER — CHLORHEXIDINE GLUCONATE 0.12 % MT SOLN
15.0000 mL | Freq: Once | OROMUCOSAL | Status: AC
  Administered 2021-08-26: 15 mL via OROMUCOSAL
  Filled 2021-08-26: qty 15

## 2021-08-26 MED ORDER — FENTANYL CITRATE (PF) 100 MCG/2ML IJ SOLN
INTRAMUSCULAR | Status: DC | PRN
  Administered 2021-08-26: 100 ug via INTRAVENOUS
  Administered 2021-08-26: 50 ug via INTRAVENOUS

## 2021-08-26 MED ORDER — SODIUM CHLORIDE 0.9% FLUSH
3.0000 mL | Freq: Two times a day (BID) | INTRAVENOUS | Status: DC
  Administered 2021-08-27 – 2021-08-29 (×4): 3 mL via INTRAVENOUS

## 2021-08-26 MED ORDER — PROPOFOL 10 MG/ML IV BOLUS
INTRAVENOUS | Status: DC | PRN
  Administered 2021-08-26: 130 mg via INTRAVENOUS

## 2021-08-26 MED ORDER — CHLORHEXIDINE GLUCONATE CLOTH 2 % EX PADS
6.0000 | MEDICATED_PAD | Freq: Once | CUTANEOUS | Status: AC
  Administered 2021-08-26: 6 via TOPICAL

## 2021-08-26 MED ORDER — PANTOPRAZOLE SODIUM 40 MG PO TBEC
40.0000 mg | DELAYED_RELEASE_TABLET | Freq: Every day | ORAL | Status: DC
  Administered 2021-08-27 – 2021-08-31 (×5): 40 mg via ORAL
  Filled 2021-08-26 (×5): qty 1

## 2021-08-26 MED ORDER — AMLODIPINE BESYLATE-VALSARTAN 10-160 MG PO TABS: 1.0000 | ORAL_TABLET | Freq: Every day | ORAL | Status: DC

## 2021-08-26 MED ORDER — ACETAMINOPHEN 325 MG PO TABS: 650.0000 mg | ORAL_TABLET | Freq: Four times a day (QID) | ORAL | Status: DC | PRN

## 2021-08-26 MED ORDER — BISACODYL 10 MG RE SUPP: 10.0000 mg | Freq: Every day | RECTAL | Status: DC | PRN

## 2021-08-26 MED ORDER — FENTANYL CITRATE (PF) 100 MCG/2ML IJ SOLN
25.0000 ug | INTRAMUSCULAR | Status: DC | PRN
  Administered 2021-08-26 (×3): 50 ug via INTRAVENOUS

## 2021-08-26 MED ORDER — BACITRACIN ZINC 500 UNIT/GM EX OINT
TOPICAL_OINTMENT | CUTANEOUS | Status: AC
  Filled 2021-08-26: qty 28.35

## 2021-08-26 MED ORDER — POLYETHYLENE GLYCOL 3350 17 G PO PACK: 17.0000 g | PACK | Freq: Every day | ORAL | Status: DC | PRN

## 2021-08-26 MED ORDER — SODIUM CHLORIDE 0.9 % IV SOLN: 250.0000 mL | INTRAVENOUS | Status: DC

## 2021-08-26 MED ORDER — CEFAZOLIN SODIUM-DEXTROSE 2-4 GM/100ML-% IV SOLN
2.0000 g | INTRAVENOUS | Status: AC
Start: 2021-08-26 — End: 2021-08-26
  Administered 2021-08-26: 2 g via INTRAVENOUS
  Filled 2021-08-26: qty 100

## 2021-08-26 MED ORDER — ADULT MULTIVITAMIN W/MINERALS CH
1.0000 | ORAL_TABLET | Freq: Every day | ORAL | Status: DC
  Administered 2021-08-27 – 2021-08-31 (×5): 1 via ORAL
  Filled 2021-08-26 (×5): qty 1

## 2021-08-26 MED ORDER — 0.9 % SODIUM CHLORIDE (POUR BTL) OPTIME
TOPICAL | Status: DC | PRN
  Administered 2021-08-26: 1000 mL

## 2021-08-26 MED ORDER — ACETAMINOPHEN 650 MG RE SUPP
650.0000 mg | Freq: Four times a day (QID) | RECTAL | Status: DC | PRN
Start: 2021-08-26 — End: 2021-08-26

## 2021-08-26 MED ORDER — DIPHENHYDRAMINE HCL 50 MG/ML IJ SOLN
INTRAMUSCULAR | Status: AC
  Filled 2021-08-26: qty 1

## 2021-08-26 MED ORDER — AMLODIPINE BESYLATE 10 MG PO TABS
10.0000 mg | ORAL_TABLET | Freq: Every day | ORAL | Status: DC
  Administered 2021-08-27 – 2021-08-31 (×5): 10 mg via ORAL
  Filled 2021-08-26: qty 2
  Filled 2021-08-26 (×4): qty 1

## 2021-08-26 MED ORDER — MIRTAZAPINE 15 MG PO TABS
30.0000 mg | ORAL_TABLET | Freq: Every day | ORAL | Status: DC
  Administered 2021-08-26 – 2021-08-30 (×5): 30 mg via ORAL
  Filled 2021-08-26 (×3): qty 2
  Filled 2021-08-26 (×2): qty 1

## 2021-08-26 MED ORDER — DEXAMETHASONE SODIUM PHOSPHATE 10 MG/ML IJ SOLN
INTRAMUSCULAR | Status: AC
  Filled 2021-08-26: qty 2

## 2021-08-26 MED ORDER — ACETAMINOPHEN 325 MG PO TABS: 650.0000 mg | ORAL_TABLET | ORAL | Status: DC | PRN

## 2021-08-26 MED ORDER — SODIUM CHLORIDE 0.9 % IV SOLN
2.0000 g | Freq: Two times a day (BID) | INTRAVENOUS | Status: DC
  Administered 2021-08-26 – 2021-08-28 (×5): 2 g via INTRAVENOUS
  Filled 2021-08-26 (×7): qty 2

## 2021-08-26 MED ORDER — THIAMINE HCL 100 MG PO TABS
100.0000 mg | ORAL_TABLET | Freq: Every day | ORAL | Status: DC
  Administered 2021-08-27 – 2021-08-31 (×5): 100 mg via ORAL
  Filled 2021-08-26 (×5): qty 1

## 2021-08-26 MED ORDER — ONDANSETRON HCL 4 MG/2ML IJ SOLN
INTRAMUSCULAR | Status: DC | PRN
  Administered 2021-08-26: 4 mg via INTRAVENOUS

## 2021-08-26 MED ORDER — ACETAMINOPHEN 650 MG RE SUPP: 650.0000 mg | RECTAL | Status: DC | PRN

## 2021-08-26 MED ORDER — LACTATED RINGERS IV SOLN: INTRAVENOUS | Status: DC

## 2021-08-26 MED ORDER — DOCUSATE SODIUM 100 MG PO CAPS: 100.0000 mg | ORAL_CAPSULE | Freq: Two times a day (BID) | ORAL | Status: DC

## 2021-08-26 MED ORDER — MIDAZOLAM HCL 5 MG/5ML IJ SOLN
INTRAMUSCULAR | Status: DC | PRN
  Administered 2021-08-26: 2 mg via INTRAVENOUS

## 2021-08-26 MED ORDER — MENTHOL 3 MG MT LOZG: 1.0000 | LOZENGE | OROMUCOSAL | Status: DC | PRN

## 2021-08-26 MED ORDER — IRBESARTAN 150 MG PO TABS
150.0000 mg | ORAL_TABLET | Freq: Every day | ORAL | Status: DC
  Administered 2021-08-27 – 2021-08-31 (×5): 150 mg via ORAL
  Filled 2021-08-26 (×5): qty 1

## 2021-08-26 MED ORDER — ROSUVASTATIN CALCIUM 5 MG PO TABS
10.0000 mg | ORAL_TABLET | Freq: Every day | ORAL | Status: DC
  Administered 2021-08-27 – 2021-08-31 (×5): 10 mg via ORAL
  Filled 2021-08-26 (×5): qty 2

## 2021-08-26 MED ORDER — LACTATED RINGERS IV SOLN: INTRAVENOUS | Status: DC | PRN

## 2021-08-26 MED ORDER — ONDANSETRON HCL 4 MG/2ML IJ SOLN
INTRAMUSCULAR | Status: AC
  Filled 2021-08-26: qty 4

## 2021-08-26 MED ORDER — DIPHENHYDRAMINE HCL 25 MG PO CAPS
25.0000 mg | ORAL_CAPSULE | Freq: Four times a day (QID) | ORAL | Status: DC | PRN
  Administered 2021-08-29 (×2): 25 mg via ORAL
  Filled 2021-08-26 (×2): qty 1

## 2021-08-26 MED ORDER — FLUOXETINE HCL 20 MG PO CAPS
20.0000 mg | ORAL_CAPSULE | Freq: Every day | ORAL | Status: DC
  Administered 2021-08-27 – 2021-08-31 (×5): 20 mg via ORAL
  Filled 2021-08-26 (×5): qty 1

## 2021-08-26 MED ORDER — CYCLOBENZAPRINE HCL 10 MG PO TABS
10.0000 mg | ORAL_TABLET | Freq: Three times a day (TID) | ORAL | Status: DC | PRN
  Administered 2021-08-26 – 2021-08-30 (×9): 10 mg via ORAL
  Filled 2021-08-26 (×11): qty 1

## 2021-08-26 MED ORDER — FENOFIBRATE 160 MG PO TABS
160.0000 mg | ORAL_TABLET | Freq: Every day | ORAL | Status: DC
  Administered 2021-08-27 – 2021-08-31 (×5): 160 mg via ORAL
  Filled 2021-08-26 (×5): qty 1

## 2021-08-26 MED ORDER — MORPHINE SULFATE (PF) 4 MG/ML IV SOLN
4.0000 mg | INTRAVENOUS | Status: DC | PRN
  Administered 2021-08-28 – 2021-08-29 (×6): 4 mg via INTRAVENOUS
  Filled 2021-08-26 (×6): qty 1

## 2021-08-26 MED ORDER — ONDANSETRON HCL 4 MG PO TABS
4.0000 mg | ORAL_TABLET | Freq: Four times a day (QID) | ORAL | Status: DC | PRN
Start: 2021-08-26 — End: 2021-08-31

## 2021-08-26 MED ORDER — SODIUM CHLORIDE 0.9% FLUSH: 3.0000 mL | INTRAVENOUS | Status: DC | PRN

## 2021-08-26 MED ORDER — VANCOMYCIN HCL IN DEXTROSE 1-5 GM/200ML-% IV SOLN
1000.0000 mg | Freq: Once | INTRAVENOUS | Status: AC
  Administered 2021-08-26: 1000 mg via INTRAVENOUS
  Filled 2021-08-26: qty 200

## 2021-08-26 MED ORDER — LEVOTHYROXINE SODIUM 50 MCG PO TABS
50.0000 ug | ORAL_TABLET | Freq: Every day | ORAL | Status: DC
  Administered 2021-08-27 – 2021-08-31 (×5): 50 ug via ORAL
  Filled 2021-08-26 (×2): qty 1
  Filled 2021-08-26: qty 2
  Filled 2021-08-26 (×2): qty 1

## 2021-08-26 SURGICAL SUPPLY — 41 items
APL SKNCLS STERI-STRIP NONHPOA (GAUZE/BANDAGES/DRESSINGS) ×1
BAG COUNTER SPONGE SURGICOUNT (BAG) ×2 IMPLANT
BAG SPNG CNTER NS LX DISP (BAG) ×1
BENZOIN TINCTURE PRP APPL 2/3 (GAUZE/BANDAGES/DRESSINGS) ×2 IMPLANT
BLADE CLIPPER SURG (BLADE) IMPLANT
CANISTER SUCT 3000ML PPV (MISCELLANEOUS) ×2 IMPLANT
CARTRIDGE OIL MAESTRO DRILL (MISCELLANEOUS) ×1 IMPLANT
CLSR STERI-STRIP ANTIMIC 1/2X4 (GAUZE/BANDAGES/DRESSINGS) ×1 IMPLANT
DIFFUSER DRILL AIR PNEUMATIC (MISCELLANEOUS) ×1 IMPLANT
DRAPE LAPAROTOMY 100X72X124 (DRAPES) ×2 IMPLANT
DRAPE SURG 17X23 STRL (DRAPES) ×8 IMPLANT
DRSG OPSITE POSTOP 4X6 (GAUZE/BANDAGES/DRESSINGS) ×1 IMPLANT
ELECT REM PT RETURN 9FT ADLT (ELECTROSURGICAL) ×2
ELECTRODE REM PT RTRN 9FT ADLT (ELECTROSURGICAL) ×1 IMPLANT
GAUZE 4X4 16PLY ~~LOC~~+RFID DBL (SPONGE) ×1 IMPLANT
GAUZE SPONGE 4X4 12PLY STRL (GAUZE/BANDAGES/DRESSINGS) ×2 IMPLANT
GLOVE EXAM NITRILE XL STR (GLOVE) IMPLANT
GLOVE SURG ENC MOIS LTX SZ8 (GLOVE) ×2 IMPLANT
GLOVE SURG ENC MOIS LTX SZ8.5 (GLOVE) ×2 IMPLANT
GOWN STRL REUS W/ TWL LRG LVL3 (GOWN DISPOSABLE) IMPLANT
GOWN STRL REUS W/ TWL XL LVL3 (GOWN DISPOSABLE) IMPLANT
GOWN STRL REUS W/TWL LRG LVL3 (GOWN DISPOSABLE)
GOWN STRL REUS W/TWL XL LVL3 (GOWN DISPOSABLE)
KIT BASIN OR (CUSTOM PROCEDURE TRAY) ×2 IMPLANT
KIT TURNOVER KIT B (KITS) ×2 IMPLANT
NEEDLE HYPO 22GX1.5 SAFETY (NEEDLE) IMPLANT
NS IRRIG 1000ML POUR BTL (IV SOLUTION) ×2 IMPLANT
OIL CARTRIDGE MAESTRO DRILL (MISCELLANEOUS)
PACK LAMINECTOMY NEURO (CUSTOM PROCEDURE TRAY) ×2 IMPLANT
PAD ARMBOARD 7.5X6 YLW CONV (MISCELLANEOUS) ×6 IMPLANT
STRIP CLOSURE SKIN 1/2X4 (GAUZE/BANDAGES/DRESSINGS) ×2 IMPLANT
SUT ETHILON 2 0 PSLX (SUTURE) IMPLANT
SUT ETHILON 3 0 FSL (SUTURE) ×2 IMPLANT
SUT VIC AB 1 CT1 18XBRD ANBCTR (SUTURE) ×1 IMPLANT
SUT VIC AB 1 CT1 8-18 (SUTURE) ×2
SUT VIC AB 2-0 CP2 18 (SUTURE) ×2 IMPLANT
SWAB COLLECTION DEVICE MRSA (MISCELLANEOUS) ×2 IMPLANT
SWAB CULTURE ESWAB REG 1ML (MISCELLANEOUS) ×2 IMPLANT
TOWEL GREEN STERILE (TOWEL DISPOSABLE) ×2 IMPLANT
TOWEL GREEN STERILE FF (TOWEL DISPOSABLE) ×2 IMPLANT
WATER STERILE IRR 1000ML POUR (IV SOLUTION) ×2 IMPLANT

## 2021-08-26 NOTE — Anesthesia Preprocedure Evaluation (Signed)
Anesthesia Evaluation  Patient identified by MRN, date of birth, ID band Patient awake    Reviewed: Allergy & Precautions, NPO status , Patient's Chart, lab work & pertinent test results  Airway Mallampati: II  TM Distance: >3 FB     Dental   Pulmonary former smoker,    breath sounds clear to auscultation       Cardiovascular hypertension,  Rhythm:Regular Rate:Normal     Neuro/Psych    GI/Hepatic Neg liver ROS, GERD  ,  Endo/Other    Renal/GU Renal disease     Musculoskeletal  (+) Arthritis ,   Abdominal   Peds  Hematology   Anesthesia Other Findings   Reproductive/Obstetrics                             Anesthesia Physical Anesthesia Plan  ASA: 3  Anesthesia Plan: General   Post-op Pain Management:    Induction: Intravenous  PONV Risk Score and Plan: 2 and Ondansetron and Dexamethasone  Airway Management Planned: Oral ETT  Additional Equipment:   Intra-op Plan:   Post-operative Plan: Extubation in OR  Informed Consent: I have reviewed the patients History and Physical, chart, labs and discussed the procedure including the risks, benefits and alternatives for the proposed anesthesia with the patient or authorized representative who has indicated his/her understanding and acceptance.     Dental advisory given  Plan Discussed with: CRNA and Anesthesiologist  Anesthesia Plan Comments:         Anesthesia Quick Evaluation

## 2021-08-26 NOTE — Progress Notes (Signed)
Pharmacy Antibiotic Note  Brandon Fleming is a 75 y.o. male admitted on 08/26/2021 with lumbar wound infection s/p surgery 08/14/21. Pharmacy has been consulted for Vancomycin dosing.  Scr 1.71 (08/15/21) S/p I + D 9/12 Also on Cefepime  Plan: Cefepime 1 gram iv Q 12 hours Vancomycin 1 gram iv x 1 dose now then 750 mg iv Q 24 (AUC of 519 using Scr of 1.71)     Temp (24hrs), Avg:98.1 F (36.7 C), Min:97.5 F (36.4 C), Max:99.1 F (37.3 C)  Recent Labs  Lab 08/26/21 1624  WBC 10.3    Estimated Creatinine Clearance: 34.2 mL/min (A) (by C-G formula based on SCr of 1.71 mg/dL (H)).    Allergies  Allergen Reactions   Aricept [Donepezil Hcl] Diarrhea and Other (See Comments)    Diarrhea, stomach problems.    Atropine Swelling and Other (See Comments)    Eyes swollen and red      Thank you for allowing pharmacy to be a part of this patient's care.  Tad Moore 08/26/2021 8:23 PM

## 2021-08-26 NOTE — Progress Notes (Signed)
Orthopedic Tech Progress Note Patient Details:  Brandon Fleming 1946/01/07 [id number]  Ortho Devices Type of Ortho Device: Lumbar corsett Ortho Device/Splint Location: delivered to PACU Ortho Device/Splint Interventions: Ordered, Application, Adjustment   Post Interventions Patient Tolerated: Well Instructions Provided: Care of device, Adjustment of device  Karolee Stamps 08/26/2021, 7:40 PM

## 2021-08-26 NOTE — Progress Notes (Signed)
Subjective: The patient is a 75 year old white male on whom I performed a extension of lumbar fusion on 08/14/2021.  Initially did well but has developed increasing pain as well as wound drainage over the last few days.  Objective: Vital signs in last 24 hours: Temp:  [97.5 F (36.4 C)] 97.5 F (36.4 C) (09/12 1611) Pulse Rate:  [81] 81 (09/12 1611) Resp:  [18] 18 (09/12 1611) BP: (125)/(54) 125/54 (09/12 1611) SpO2:  [100 %] 100 % (09/12 1611) Estimated body mass index is 24.21 kg/m as calculated from the following:   Height as of 08/14/21: '5\' 6"'$  (1.676 m).   Weight as of 08/14/21: 68 kg.   Intake/Output from previous day: No intake/output data recorded. Intake/Output this shift: No intake/output data recorded.  Physical exam patient is alert and pleasant.  He is moving his lower extremities well.  He has purulent drainage from his wound.  Lab Results: No results for input(s): WBC, HGB, HCT, PLT in the last 72 hours. BMET No results for input(s): NA, K, CL, CO2, GLUCOSE, BUN, CREATININE, CALCIUM in the last 72 hours.  Studies/Results: No results found.  Assessment/Plan: Lumbar wound drainage: I have recommended an incision and drainage of his wound with obtaining of cultures.  I have described the surgery to them.  We have discussed the risk, benefits, alternatives, expected postop course, etc.  I have answered all his questions.  He wants to proceed with surgery.  LOS: 0 days     Ophelia Charter 08/26/2021, 5:05 PM

## 2021-08-26 NOTE — Op Note (Signed)
Brief history: The patient is a 75 year old white male on whom I performed extensive a lumbar fusion on 08/14/2021.  The patient began having drainage from his wound about 3 days ago.  I recommended an I&D.  Pre-op diagnosis: Wound drainage  Postop diagnosis: Wound infection  Procedure: Incision and drainage of lumbar wound  Surgeon: Dr. Earle Gell  Assistant: None  Anesthesia: General tracheal  Estimated blood loss: Minimal  Specimens: Cultures  Drains: None  Complications: None  Description of procedure: The patient was brought to the operating room by the anesthesia team.  General endotracheal anesthesia was induced.  The patient was turned to the prone position on the Wilson frame.  His lumbosacral region was then prepared with Betadine scrub and Betadine solution.  Sterile drapes were applied.  I then incised with the patient's fresh surgical scar and immediately countered abundant pus.  We used the cerebellar retractors for exposure.  We obtain cultures.  There was a lot of pus in the subcutaneous space which I removed with suction and irrigation.  The lumbosacral fascia was not intact and the pus tracked down to the spine/hardware.  I removed it with suction and irrigation.  I obtained hemostasis with bipolar electrocautery I placed a gram of vancomycin powder in the epidural space and another gram in subcutaneous space.  I removed the retractors.  I reapproximated the patient's lumbosacral fascia with interrupted #1 Vicryl suture.  I reapproximated the subcutaneous tissue with interrupted 2-0 Vicryl suture.  I reapproximated the skin with a running 2-0 nylon suture.  The wound was then coated with bacitracin ointment.  A sterile dressing was applied.  The drapes were removed.  By report all sponge, instrument, and needle counts were correct at the end of this case.

## 2021-08-26 NOTE — Anesthesia Procedure Notes (Signed)
Procedure Name: Intubation Date/Time: 08/26/2021 5:50 PM Performed by: Minerva Ends, CRNA Pre-anesthesia Checklist: Patient identified, Emergency Drugs available, Suction available and Patient being monitored Patient Re-evaluated:Patient Re-evaluated prior to induction Oxygen Delivery Method: Circle system utilized Preoxygenation: Pre-oxygenation with 100% oxygen Induction Type: IV induction Ventilation: Mask ventilation without difficulty Laryngoscope Size: Mac and 3 Grade View: Grade I Tube type: Oral Tube size: 7.0 mm Number of attempts: 1 Airway Equipment and Method: Stylet and Oral airway Placement Confirmation: ETT inserted through vocal cords under direct vision, positive ETCO2 and breath sounds checked- equal and bilateral Secured at: 22 cm Tube secured with: Tape Dental Injury: Teeth and Oropharynx as per pre-operative assessment

## 2021-08-26 NOTE — Transfer of Care (Signed)
Immediate Anesthesia Transfer of Care Note  Patient: Brandon Fleming  Procedure(s) Performed: LUMBAR WOUND DEBRIDEMENT (Back)  Patient Location: PACU  Anesthesia Type:General  Level of Consciousness: awake, alert  and oriented  Airway & Oxygen Therapy: Patient Spontanous Breathing  Post-op Assessment: Report given to RN and Post -op Vital signs reviewed and stable  Post vital signs: Reviewed and stable  Last Vitals:  Vitals Value Taken Time  BP 135/69 08/26/21 1847  Temp    Pulse 95 08/26/21 1848  Resp 13 08/26/21 1848  SpO2 100 % 08/26/21 1848  Vitals shown include unvalidated device data.  Last Pain:  Vitals:   08/26/21 1611  TempSrc: Oral  PainSc: 7          Complications: No notable events documented.

## 2021-08-26 NOTE — H&P (Signed)
Brandon Fleming is an 75 y.o. male.   Chief Complaint: Surgical Wound Drainage HPI: Patient underwent an L3-4 fusion with revision of hardware at L4-5 on 08/14/2021. His hospital course was uncomplicated and he was discharged on 08/15/2021. He presented to clinic today with his wife for a wound check. They reported a three day history of increased pain and drainage from the patient's surgical site. The drainage has been soaking through the gauze dressings his wife has applied, through his clothing, and has saturated his lumbar brace. The patient and his wife had not checked the patient's temperature since symptoms started. His wife reports he hasn't eaten in two days and has been drinking minimally. At present, he rates his pain a 9/10.  Past Medical History:  Diagnosis Date   Alcohol abuse    Anxiety    Depression    Gait abnormality 04/08/2018   GERD (gastroesophageal reflux disease)    Hyperlipidemia    Hypertension    Kidney damage    Memory difficulties 04/08/2018    Past Surgical History:  Procedure Laterality Date   BACK SURGERY     COLONOSCOPY     no polyps   COLONOSCOPY W/ POLYPECTOMY     EYE SURGERY     KNEE SURGERY Left 2011   fracture   LASER PHOTO ABLATION Left 05/18/2018   Procedure: LASER PHOTO ABLATION;  Surgeon: Hayden Pedro, MD;  Location: [facility];  Service: Ophthalmology;  Laterality: Left;   PARS PLANA VITRECTOMY Left 05/18/2018   Procedure: PARS PLANA VITRECTOMY WITH 25G REMOVAL/SUTURE INTRAOCULAR LENS;  Surgeon: Hayden Pedro, MD;  Location: [facility];  Service: Ophthalmology;  Laterality: Left;   VITRECTOMY Left 05/18/2018   PARS PLANA VITRECTOMY WITH 25G REMOVAL/SUTURE INTRAOCULAR LENS (Left)    Family History  Problem Relation Age of Onset   CAD Father    Kidney disease Father    Parkinson's disease Mother    Anesthesia problems Neg Hx    Broken bones Neg Hx    Cancer Neg Hx    Clotting disorder Neg Hx    Collagen disease Neg Hx    Diabetes Neg Hx     Dislocations Neg Hx    Osteoporosis Neg Hx    Rheumatologic disease Neg Hx    Scoliosis Neg Hx    Severe sprains Neg Hx    Social History:  reports that he quit smoking about 44 years ago. His smoking use included cigarettes. He has never used smokeless tobacco. He reports current alcohol use. He reports that he does not use drugs.  Allergies:  Allergies  Allergen Reactions   Aricept [Donepezil Hcl] Diarrhea and Other (See Comments)    Diarrhea, stomach problems.    Atropine Swelling and Other (See Comments)    Eyes swollen and red    No medications prior to admission.    No results found for this or any previous visit (from the past 48 hour(s)). No results found.  Review of Systems  Constitutional:  Positive for activity change, appetite change and fatigue. Negative for chills and diaphoresis.  HENT: Negative.    Eyes: Negative.   Respiratory: Negative.    Cardiovascular: Negative.   Gastrointestinal: Negative.   Genitourinary: Negative.   Musculoskeletal:  Positive for back pain, gait problem and myalgias.  Skin:        Drainage from surgical wound  Allergic/Immunologic: Negative.   Hematological: Negative.   Psychiatric/Behavioral: Negative.     Patient's vitals taken in the office: BP  100/63 HR 91 Temp 101.0 Pain 9/10  Physical Exam Constitutional:      Appearance: Normal appearance.  HENT:     Head: Normocephalic and atraumatic.     Nose: Nose normal.     Mouth/Throat:     Mouth: Mucous membranes are moist.     Pharynx: Oropharynx is clear.  Eyes:     Extraocular Movements: Extraocular movements intact.     Conjunctiva/sclera: Conjunctivae normal.     Pupils: Pupils are equal, round, and reactive to light.  Pulmonary:     Effort: Pulmonary effort is normal. No respiratory distress.  Abdominal:     General: Abdomen is flat.     Palpations: Abdomen is soft.  Musculoskeletal:        General: Tenderness present. Normal range of motion.     Cervical  back: Normal range of motion and neck supple.  Skin:    General: Skin is warm and dry.     Capillary Refill: Capillary refill takes less than 2 seconds.       Neurological:     General: No focal deficit present.     Mental Status: He is alert and oriented to person, place, and time. Mental status is at baseline.  Psychiatric:        Mood and Affect: Mood normal.        Behavior: Behavior normal.     Assessment/Plan Patient underwent fusion on 08/14/2021. He developed wound drainage three days ago that has continued to worsen. His wound is inflamed and with purulent drainage. Dr. Arnoldo Morale will take the patient to the OR today for wound exploration and wash out. The procedure has been explained to the patient and his wife, along with the risks of surgery. The patient has voiced understanding and wishes to proceed with the I and D.  Patricia Nettle, NP 08/26/2021, 3:50 PM

## 2021-08-27 ENCOUNTER — Encounter (HOSPITAL_COMMUNITY): Payer: Self-pay | Admitting: Neurosurgery

## 2021-08-27 DIAGNOSIS — M545 Low back pain, unspecified: Secondary | ICD-10-CM | POA: Diagnosis not present

## 2021-08-27 DIAGNOSIS — L7682 Other postprocedural complications of skin and subcutaneous tissue: Secondary | ICD-10-CM

## 2021-08-27 DIAGNOSIS — T361X5A Adverse effect of cephalosporins and other beta-lactam antibiotics, initial encounter: Secondary | ICD-10-CM | POA: Diagnosis not present

## 2021-08-27 DIAGNOSIS — Z8249 Family history of ischemic heart disease and other diseases of the circulatory system: Secondary | ICD-10-CM | POA: Diagnosis not present

## 2021-08-27 DIAGNOSIS — Z888 Allergy status to other drugs, medicaments and biological substances status: Secondary | ICD-10-CM | POA: Diagnosis not present

## 2021-08-27 DIAGNOSIS — Y838 Other surgical procedures as the cause of abnormal reaction of the patient, or of later complication, without mention of misadventure at the time of the procedure: Secondary | ICD-10-CM | POA: Diagnosis present

## 2021-08-27 DIAGNOSIS — Z87891 Personal history of nicotine dependence: Secondary | ICD-10-CM | POA: Diagnosis not present

## 2021-08-27 DIAGNOSIS — T847XXD Infection and inflammatory reaction due to other internal orthopedic prosthetic devices, implants and grafts, subsequent encounter: Secondary | ICD-10-CM | POA: Diagnosis not present

## 2021-08-27 DIAGNOSIS — E785 Hyperlipidemia, unspecified: Secondary | ICD-10-CM | POA: Diagnosis present

## 2021-08-27 DIAGNOSIS — T847XXA Infection and inflammatory reaction due to other internal orthopedic prosthetic devices, implants and grafts, initial encounter: Secondary | ICD-10-CM

## 2021-08-27 DIAGNOSIS — T8141XA Infection following a procedure, superficial incisional surgical site, initial encounter: Secondary | ICD-10-CM | POA: Diagnosis present

## 2021-08-27 DIAGNOSIS — L270 Generalized skin eruption due to drugs and medicaments taken internally: Secondary | ICD-10-CM | POA: Diagnosis not present

## 2021-08-27 DIAGNOSIS — K219 Gastro-esophageal reflux disease without esophagitis: Secondary | ICD-10-CM | POA: Diagnosis present

## 2021-08-27 DIAGNOSIS — Z20822 Contact with and (suspected) exposure to covid-19: Secondary | ICD-10-CM | POA: Diagnosis present

## 2021-08-27 DIAGNOSIS — T8130XA Disruption of wound, unspecified, initial encounter: Secondary | ICD-10-CM | POA: Diagnosis present

## 2021-08-27 DIAGNOSIS — I10 Essential (primary) hypertension: Secondary | ICD-10-CM | POA: Diagnosis present

## 2021-08-27 DIAGNOSIS — Z981 Arthrodesis status: Secondary | ICD-10-CM | POA: Diagnosis not present

## 2021-08-27 LAB — BASIC METABOLIC PANEL
Anion gap: 11 (ref 5–15)
BUN: 27 mg/dL — ABNORMAL HIGH (ref 8–23)
CO2: 20 mmol/L — ABNORMAL LOW (ref 22–32)
Calcium: 8.6 mg/dL — ABNORMAL LOW (ref 8.9–10.3)
Chloride: 100 mmol/L (ref 98–111)
Creatinine, Ser: 1.34 mg/dL — ABNORMAL HIGH (ref 0.61–1.24)
GFR, Estimated: 56 mL/min — ABNORMAL LOW (ref >60)
Glucose, Bld: 188 mg/dL — ABNORMAL HIGH (ref 70–99)
Potassium: 3.6 mmol/L (ref 3.5–5.1)
Sodium: 131 mmol/L — ABNORMAL LOW (ref 135–145)

## 2021-08-27 LAB — CBC
HCT: 28 % — ABNORMAL LOW (ref 39.0–52.0)
Hemoglobin: 9.8 g/dL — ABNORMAL LOW (ref 13.0–17.0)
MCH: 33.2 pg (ref 26.0–34.0)
MCHC: 35 g/dL (ref 30.0–36.0)
MCV: 94.9 fL (ref 80.0–100.0)
Platelets: 229 10*3/uL (ref 150–400)
RBC: 2.95 MIL/uL — ABNORMAL LOW (ref 4.22–5.81)
RDW: 11.8 % (ref 11.5–15.5)
WBC: 8 10*3/uL (ref 4.0–10.5)
nRBC: 0 % (ref 0.0–0.2)

## 2021-08-27 LAB — HEMOGLOBIN A1C
Hgb A1c MFr Bld: 4.8 % (ref 4.8–5.6)
Mean Plasma Glucose: 91.06 mg/dL

## 2021-08-27 LAB — GLUCOSE, CAPILLARY
Glucose-Capillary: 155 mg/dL — ABNORMAL HIGH (ref 70–99)
Glucose-Capillary: 131 mg/dL — ABNORMAL HIGH (ref 70–99)
Glucose-Capillary: 132 mg/dL — ABNORMAL HIGH (ref 70–99)
Glucose-Capillary: 113 mg/dL — ABNORMAL HIGH (ref 70–99)

## 2021-08-27 LAB — SEDIMENTATION RATE: Sed Rate: 67 mm/hr — ABNORMAL HIGH (ref 0–16)

## 2021-08-27 MED ORDER — VANCOMYCIN HCL IN DEXTROSE 1-5 GM/200ML-% IV SOLN
1000.0000 mg | INTRAVENOUS | Status: DC
  Administered 2021-08-27 – 2021-08-28 (×2): 1000 mg via INTRAVENOUS
  Filled 2021-08-27 (×3): qty 200

## 2021-08-27 MED ORDER — INSULIN ASPART 100 UNIT/ML IJ SOLN: 0.0000 [IU] | Freq: Every day | INTRAMUSCULAR | Status: DC

## 2021-08-27 MED ORDER — INSULIN ASPART 100 UNIT/ML IJ SOLN: 0.0000 [IU] | INTRAMUSCULAR | Status: DC

## 2021-08-27 MED ORDER — INSULIN ASPART 100 UNIT/ML IJ SOLN: 0.0000 [IU] | Freq: Three times a day (TID) | INTRAMUSCULAR | Status: DC

## 2021-08-27 NOTE — Progress Notes (Signed)
Subjective: The patient is alert and pleasant.  He feels much better than yesterday.  Objective: Vital signs in last 24 hours: Temp:  [97.5 F (36.4 C)-99.1 F (37.3 C)] 97.6 F (36.4 C) (09/12 2345) Pulse Rate:  [80-96] 86 (09/13 0409) Resp:  [11-20] 20 (09/13 0409) BP: (125-155)/(54-86) 132/82 (09/13 0409) SpO2:  [97 %-100 %] 98 % (09/13 0409) Estimated body mass index is 24.21 kg/m as calculated from the following:   Height as of 08/14/21: '5\' 6"'$  (1.676 m).   Weight as of 08/14/21: 68 kg.   Intake/Output from previous day: 09/12 0701 - 09/13 0700 In: 1076.2 [I.V.:926.2; IV Piggyback:100] Out: 5 [Blood:5] Intake/Output this shift: No intake/output data recorded.  Physical exam the patient is alert and pleasant.  His strength is normal.  His dressing is clean and dry.  Lab Results: Recent Labs    08/26/21 1624 08/27/21 0449  WBC 10.3 8.0  HGB 9.9* 9.8*  HCT 29.5* 28.0*  PLT 230 229   BMET Recent Labs    08/27/21 0449  NA 131*  K 3.6  CL 100  CO2 20*  GLUCOSE 188*  BUN 27*  CREATININE 1.34*  CALCIUM 8.6*    Studies/Results: No results found.  Assessment/Plan: Postop day #1, wound infection: We are awaiting the wound cultures.  I will order a sed rate and blood cultures.  I will ask ID to see the patient to help [place] with choice of antibiotics and length of treatment.  We will wait on the PICC line for now.  I have answered all his questions.   Ophelia Charter 08/27/2021, 7:48 AM     Patient ID: Brandon Fleming, male   DOB: [birth year], 75 y.o.   MRN: [id number]

## 2021-08-27 NOTE — Evaluation (Addendum)
Physical Therapy Evaluation Patient Details Name: Brandon Fleming MRN: [id number] DOB: 1946-02-15 Today's Date: 08/27/2021  History of Present Illness  75 y.o. male presenting to [facility] for infection of recent lumbar fusion on 08/14/2021, he is now s/p I&D 9/12. PMH significant for previous lumber surgery in 2019, anxiety/depression, HLD, HTN and memory loss.   Clinical Impression  Pt admitted with above diagnosis. At the time of PT eval, pt was able to demonstrate transfers and ambulation with gross min guard assist and no AD. Pt with poor recall of precautions and difficulty donning brace. He has good tolerance for functional activity, but declining an AD and appears unsteady at times. I do feel he is at a high risk for falls at this time. Although pt asked that PT not return, will keep on caseload to check on in a few days to monitor for decline in function and reinforce safety/back precautions.   Pt was educated on precautions, brace application/wearing schedule, appropriate activity progression, and car transfer. Pt currently with functional limitations due to the deficits listed below (see PT Problem List). Pt will benefit from skilled PT to increase their independence and safety with mobility to allow discharge to the venue listed below.         Recommendations for follow up therapy are one component of a multi-disciplinary discharge planning process, led by the attending physician.  Recommendations may be updated based on patient status, additional functional criteria and insurance authorization.  Follow Up Recommendations No PT follow up;Supervision for mobility/OOB    Equipment Recommendations  None recommended by PT    Recommendations for Other Services       Precautions / Restrictions Precautions Precautions: Back;Fall Precaution Booklet Issued: Yes (comment) Precaution Comments: Reviewed precautions verbally. Pt reports he has handout from prior admission. Required Braces or  Orthoses: Spinal Brace Spinal Brace: Lumbar corset Restrictions Weight Bearing Restrictions: No      Mobility  Bed Mobility Overal bed mobility: Needs Assistance Bed Mobility: Supine to Sit     Supine to sit: Supervision     General bed mobility comments: Poor recall of log roll technique. VC's to roll to the side first but pt not making corrective changes.    Transfers Overall transfer level: Needs assistance Equipment used: None Transfers: Sit to/from Stand Sit to Stand: Min guard         General transfer comment: VC's for improved posture. Close guard for safety.  Ambulation/Gait Ambulation/Gait assistance: Min guard Gait Distance (Feet): 500 Feet Assistive device: None Gait Pattern/deviations: Step-through pattern;Decreased stride length;Drifts right/left;Trunk flexed Gait velocity: Decreased Gait velocity interpretation: 1.31 - 2.62 ft/sec, indicative of limited community ambulator General Gait Details: Very flexed trunk throughout gait training. Pt drifting R and L throughout hallway path and had difficulty with path finding back to his room. Occasional unsteadiness noted but no assist required. Hands-on guarding for safety provided throughout.  Stairs            Wheelchair Mobility    Modified Rankin (Stroke Patients Only)       Balance Overall balance assessment: Needs assistance Sitting-balance support: Feet supported Sitting balance-Leahy Scale: Good     Standing balance support: Single extremity supported;During functional activity Standing balance-Leahy Scale: Fair                               Pertinent Vitals/Pain Pain Assessment: Faces Faces Pain Scale: Hurts a little bit Pain Location: low back  Pain Descriptors / Indicators: Operative site guarding;Grimacing;Guarding;Sharp Pain Intervention(s): Limited activity within patient's tolerance;Monitored during session;Repositioned    Home Living Family/patient expects to be  discharged to:: Private residence Living Arrangements: Spouse/significant other Available Help at Discharge: Family;Available 24 hours/day Type of Home: House Home Access: Stairs to enter Entrance Stairs-Rails: Psychiatric nurse of Steps: 1 Home Layout: Multi-level;Able to live on main level with bedroom/bathroom Home Equipment: Gilford Rile - 2 wheels;Wheelchair - manual;Shower seat;Grab bars - tub/shower;Hand held shower head      Prior Function Level of Independence: Independent         Comments: Pt reports that he was moving well and completing all tasks indep initally after surgery, once the infection started he was requiring more assist from his wife     Hand Dominance   Dominant Hand: Right    Extremity/Trunk Assessment   Upper Extremity Assessment Upper Extremity Assessment: Defer to OT evaluation    Lower Extremity Assessment Lower Extremity Assessment: Generalized weakness    Cervical / Trunk Assessment Cervical / Trunk Assessment: Other exceptions Cervical / Trunk Exceptions: back surgery  Communication   Communication: No difficulties  Cognition Arousal/Alertness: Awake/alert Behavior During Therapy: Restless Overall Cognitive Status: History of cognitive impairments - at baseline                                 General Comments: Decreased short term memory at baseline. Pt had difficulty navigating hallway and finding his room again during gait training. Seemed confused with multi-step directions at times.      General Comments      Exercises     Assessment/Plan    PT Assessment Patient needs continued PT services  PT Problem List Decreased strength;Decreased activity tolerance;Decreased balance;Decreased mobility;Decreased knowledge of use of DME;Decreased safety awareness;Decreased knowledge of precautions;Pain;Decreased cognition       PT Treatment Interventions DME instruction;Gait training;Functional mobility  training;Therapeutic activities;Therapeutic exercise;Neuromuscular re-education;Balance training;Cognitive remediation;Patient/family education    PT Goals (Current goals can be found in the Care Plan section)  Acute Rehab PT Goals Patient Stated Goal: Hoping to return home PT Goal Formulation: With patient Time For Goal Achievement: 09/03/21 Potential to Achieve Goals: Good    Frequency Min 2X/week   Barriers to discharge        Co-evaluation               AM-PAC PT "6 Clicks" Mobility  Outcome Measure Help needed turning from your back to your side while in a flat bed without using bedrails?: None Help needed moving from lying on your back to sitting on the side of a flat bed without using bedrails?: A Little Help needed moving to and from a bed to a chair (including a wheelchair)?: A Little Help needed standing up from a chair using your arms (e.g., wheelchair or bedside chair)?: A Little Help needed to walk in hospital room?: A Little Help needed climbing 3-5 steps with a railing? : A Little 6 Click Score: 19    End of Session Equipment Utilized During Treatment: Gait belt Activity Tolerance: Patient tolerated treatment well Patient left: in chair;with call bell/phone within reach Nurse Communication: Mobility status PT Visit Diagnosis: Unsteadiness on feet (R26.81);Pain Pain - part of body:  (back)    Time: [id number] PT Time Calculation (min) (ACUTE ONLY): 24 min   Charges:   PT Evaluation $PT Eval Low Complexity: 1 Low PT Treatments $Gait Training: 8-22 mins  Rolinda Roan, PT, DPT Acute Rehabilitation Services Pager: [phone] Office: [place] 08/27/2021, 3:24 PM

## 2021-08-27 NOTE — Anesthesia Postprocedure Evaluation (Signed)
Anesthesia Post Note  Patient: Brandon Fleming  Procedure(s) Performed: LUMBAR WOUND DEBRIDEMENT (Back)     Patient location during evaluation: PACU Anesthesia Type: General Level of consciousness: awake and alert Pain management: pain level controlled Vital Signs Assessment: post-procedure vital signs reviewed and stable Respiratory status: spontaneous breathing, nonlabored ventilation, respiratory function stable and patient connected to nasal cannula oxygen Cardiovascular status: blood pressure returned to baseline and stable Postop Assessment: no apparent nausea or vomiting Anesthetic complications: no   No notable events documented.  Last Vitals:  Vitals:   08/26/21 2001 08/26/21 2345  BP: (!) 155/86 132/81  Pulse: 93 96  Resp: 16 18  Temp: 36.6 C 36.4 C  SpO2: 100% 97%    Last Pain:  Vitals:   08/27/21 0034  TempSrc:   PainSc: [place]

## 2021-08-27 NOTE — Progress Notes (Signed)
Occupational Therapy Evaluation Patient Details Name: Brandon Fleming MRN: [id number] DOB: [birth year] Today's Date: 08/27/2021   History of Present Illness 75 y.o. male presenting to [facility] for infection of recent  extensive a lumbar fusion on 08/14/2021, he is now s/p I&D 9/12. PMHx significant for previous lumber surgery in 2019, anxiety/depression, HLD, HTN and memory loss.   Clinical Impression   Brandon Fleming was indep prior to his initial admission for back sx on 8/31. He lives at home with his wife who provides assist as needed. Upon evaluation pt required max cueing for all back precautions, and was sitting EOB without brace donned. He donned brace mod I. Pt was close min guard for all functional ambulation without AD (he stated he has to been using RW at home); he was reaching for external support and unsteady. Educated on importance of use of RW for ambulation, he agreed. Pt would benefit from continued OT to progress back towards his mod I baseline. Recommend home with no OT follow up.      Recommendations for follow up therapy are one component of a multi-disciplinary discharge planning process, led by the attending physician.  Recommendations may be updated based on patient status, additional functional criteria and insurance authorization.   Follow Up Recommendations  No OT follow up;Supervision/Assistance - 24 hour    Equipment Recommendations  None recommended by OT       Precautions / Restrictions Precautions Precautions: Back Precaution Booklet Issued: Yes (comment) Precaution Comments: verbally reviewed all back precautions Required Braces or Orthoses: Spinal Brace Spinal Brace: Lumbar corset Restrictions Weight Bearing Restrictions: No      Mobility Bed Mobility Overal bed mobility: Needs Assistance Bed Mobility: Sidelying to Sit;Sit to Sidelying   Sidelying to sit: Supervision     Sit to sidelying: Supervision General bed mobility comments: verbal cues, no physcial  assistance    Transfers Overall transfer level: Needs assistance Equipment used: None Transfers: Sit to/from Stand Sit to Stand: Min guard         General transfer comment: close min guard for safety, no physical assist    Balance Overall balance assessment: Needs assistance Sitting-balance support: Feet supported Sitting balance-Leahy Scale: Good     Standing balance support: Single extremity supported;During functional activity Standing balance-Leahy Scale: Fair Standing balance comment: able to statically stand without AD. Required 1UE supported on external surface during dynamic functional task                           ADL either performed or assessed with clinical judgement   ADL Overall ADL's : Needs assistance/impaired Eating/Feeding: Independent;Sitting   Grooming: Min guard;Standing Grooming Details (indicate cue type and reason): standing at the sink Upper Body Bathing: Set up;Sitting   Lower Body Bathing: Minimal assistance;Sit to/from stand Lower Body Bathing Details (indicate cue type and reason): min A to maintain back precautions Upper Body Dressing : Set up;Sitting   Lower Body Dressing: Minimal assistance;Sit to/from stand Lower Body Dressing Details (indicate cue type and reason): min A to maintain back precautions Toilet Transfer: Min Psychiatric nurse Details (indicate cue type and reason): close min guard this session without AD Toileting- Clothing Manipulation and Hygiene: Supervision/safety;Sitting/lateral lean       Functional mobility during ADLs: Min guard;Cueing for safety General ADL Comments: close min guard for all functional ambulation this session without AD, pt was reaching for external support (walls, bed, counter). Educated on important use of RW. verbal cues  throughout for back precautions.     Vision Baseline Vision/History: 1 Wears glasses Ability to See in Adequate Light: 0 Adequate Vision Assessment?: No  apparent visual deficits     Perception     Praxis      Pertinent Vitals/Pain Pain Assessment: Faces Faces Pain Scale: Hurts a little bit Pain Location: low back Pain Descriptors / Indicators: Operative site guarding;Grimacing;Guarding;Sharp Pain Intervention(s): Limited activity within patient's tolerance;Monitored during session     Hand Dominance Right   Extremity/Trunk Assessment Upper Extremity Assessment Upper Extremity Assessment: Generalized weakness   Lower Extremity Assessment Lower Extremity Assessment: Defer to PT evaluation   Cervical / Trunk Assessment Cervical / Trunk Assessment: Other exceptions Cervical / Trunk Exceptions: back surgery   Communication Communication Communication: No difficulties   Cognition Arousal/Alertness: Awake/alert Behavior During Therapy: Restless Overall Cognitive Status: Within Functional Limits for tasks assessed                                     General Comments  VSS on RA, pt required home IV antibiotics, MD stated he will be in the hospital until at least thursday 9/15.    Exercises     Shoulder Instructions      Home Living Family/patient expects to be discharged to:: Private residence Living Arrangements: Spouse/significant other Available Help at Discharge: Family;Available 24 hours/day Type of Home: House Home Access: Stairs to enter [employer] of Steps: 1 Entrance Stairs-Rails: Right;Left Home Layout: Multi-level;Able to live on main level with bedroom/bathroom Alternate Level Stairs-Number of Steps: flight - his BR is upstairs, but can stay in the downstairs BR   Bathroom Shower/Tub: Walk-in shower   Bathroom Toilet: Standard Bathroom Accessibility: Yes How Accessible: Accessible via walker Home Equipment: [employer] - 2 wheels;Wheelchair - manual;Shower seat;Grab bars - tub/shower;Hand held shower head          Prior Functioning/Environment Level of Independence: Independent         Comments: Pt reports that he was moving well and completing all tasks indep initally after surgery, once the infection started he was requiring more assist from his wife        OT Problem List: Impaired balance (sitting and/or standing);Pain      OT Treatment/Interventions: Self-care/ADL training;Therapeutic activities    OT Goals(Current goals can be found in the care plan section) Acute Rehab OT Goals Patient Stated Goal: Hoping to return home OT Goal Formulation: With patient Time For Goal Achievement: 09/10/21 Potential to Achieve Goals: Good ADL Goals Pt Will Perform Grooming: with modified independence;standing Pt Will Perform Lower Body Dressing: with modified independence;sit to/from stand Pt Will Transfer to Toilet: with modified independence;ambulating;regular height toilet Additional ADL Goal #1: Pt will independently recall back precautions and brace wear schedule to prepare for safe d/c home  OT Frequency: Min 2X/week   Barriers to D/C:            Co-evaluation              AM-PAC OT "6 Clicks" Daily Activity     Outcome Measure Help from another person eating meals?: None Help from another person taking care of personal grooming?: A Little Help from another person toileting, which includes using toliet, bedpan, or urinal?: A Little Help from another person bathing (including washing, rinsing, drying)?: A Little Help from another person to put on and taking off regular upper body clothing?: None Help from another person  to put on and taking off regular lower body clothing?: A Little 6 Click Score: 20   End of Session Nurse Communication: Mobility status  Activity Tolerance: Patient tolerated treatment well Patient left: in bed;with call bell/phone within reach  OT Visit Diagnosis: Unsteadiness on feet (R26.81);Pain;Dizziness and giddiness (R42)                Time: [id number] OT Time Calculation (min): 14 min Charges:  OT General  Charges $OT Visit: 1 Visit OT Evaluation $OT Eval Low Complexity: 1 Low   Kymoni Lesperance A Epifanio Labrador 08/27/2021, 8:33 AM

## 2021-08-27 NOTE — Progress Notes (Signed)
Pharmacy Antibiotic Note  Brandon Fleming is a 75 y.o. male admitted on 08/26/2021 with  lumbar wound infection .  Pharmacy has been consulted for Vanco dosing.   ID: back wound. Tmax 99.1. WBC 8. Scr 1.34 down I+D 08/26/21  Vancomycin 9/12> Cefepime 9/12>  9/12: Vanco '750mg'$  IV q24h, (AUC of 519 using Scr 1.71) 9/13: Vanco 1g/24h (AUC 502 using Scr down to 1.34)   Plan: Increase Vanco to 1g IV q24h Awaiting wound cultures. Consult ID on LOT     Temp (24hrs), Avg:97.9 F (36.6 C), Min:97.5 F (36.4 C), Max:99.1 F (37.3 C)  Recent Labs  Lab 08/26/21 1624 08/27/21 0449  WBC 10.3 8.0  CREATININE  --  1.34*    Estimated Creatinine Clearance: 43.6 mL/min (A) (by C-G formula based on SCr of 1.34 mg/dL (H)).    Allergies  Allergen Reactions   Aricept [Donepezil Hcl] Diarrhea and Other (See Comments)    Diarrhea, stomach problems.    Atropine Swelling and Other (See Comments)    Eyes swollen and red    Bridgit Eynon S. Alford Highland, PharmD, BCPS Clinical Staff Pharmacist [email or web address]  Wayland Salinas 08/27/2021 2:09 PM

## 2021-08-27 NOTE — Progress Notes (Signed)
PT Cancellation Note  Patient Details Name: Brandon Fleming MRN: [id number] DOB: 1946-03-07   Cancelled Treatment:    Reason Eval/Treat Not Completed: Patient declined, no reason specified.   Pt declined working with physical therapy at this time. He requests that PT return later.    Thelma Comp 08/27/2021, 10:25 AM  Rolinda Roan, PT, DPT Acute Rehabilitation Services Pager: [phone] Office: [phone]

## 2021-08-27 NOTE — Care Management Obs Status (Signed)
[place] NOTIFICATION   Patient Details  Name: QUENTON SHINALL MRN: [id number] Date of Birth: March 07, 1946   Medicare Observation Status Notification Given:  Yes    Joanne Chars, [place] 08/27/2021, 2:10 PM

## 2021-08-27 NOTE — Anesthesia Postprocedure Evaluation (Signed)
Anesthesia Post Note  Patient: Brandon Fleming  Procedure(s) Performed: LUMBAR WOUND DEBRIDEMENT (Back)     Anesthesia Post Evaluation No notable events documented.  Last Vitals:  Vitals:   08/27/21 0409 08/27/21 0824  BP: 132/82 106/76  Pulse: 86 91  Resp: 20 17  Temp:  36.4 C  SpO2: 98% 100%    Last Pain:  Vitals:   08/27/21 0612  TempSrc:   PainSc: 7                  [name]

## 2021-08-27 NOTE — Consult Note (Signed)
[place] for Infectious Disease    Date of Admission:  08/26/2021     Reason for Consult: surgical site infection    Referring Provider: Arnoldo Morale     Lines:  Peripheral iv's  Abx: 9/12-c vanc 9/12-c cefepime        Assessment: Brandon Fleming surgical site infection Hardware complicating wound infection  75 yo male hx l4-5 fusion 2019 with recurrent back pain/neurogenic claudication, s/p on 8/31 L3-4 laminotomy/facetectomy (complex surgical procedure due to prior Brandon Fleming intervody fusion), right L3-4 interbody fusion and zimmer DBM and insertion interbody prosthesis, posterior L3-5 instrumentation, however complicated most recently by early surgical site infection admitted 9/12 for I&D with retention hardware  Reviewed 9/12 operative note. Lot of pus in sq space; compromised lumbosacral fascia with pus tracking down to spine/hardware. Wound cx so far showing gpc  No sepsis this admission otherwise. No bcx obtained.  Given recent hardware placement <30 days, if this is staph aureus, would be reasonable to consider addition of rifampin   Plan: Continue vanc/cefepime F/u wound cx Will adjust abx per cx report Discussed with primary team   I spent 60 minute reviewing data/chart, and coordinating care and >50% direct face to face time providing counseling/discussing diagnostics/treatment plan with patient       ------------------------------------------------ Active Problems:   Wound infection complicating hardware ([place])   Postoperative complication of skin involving drainage from surgical wound    HPI: Brandon Fleming is a 75 y.o. male hx Brandon Fleming surgery with redo recently (8/31) along with new instrumentation, complicated by early deep space surgical site infection s/p I&D 9/12  Patient had long redo surgery 8/31. 3 days prior to this admission notice serous/purulent discharge and worsening pain from surgical site noted during [place] [facility] f/u. No f/c however  associated decreased appetite  No LE neurologic deficit  No prior abx given  Patient admitted 9/12 for I&D. Reviewed opnote. No hardware removed No sepsis here  No bcx obtained  Currently on empiric vanc/cefepime Wound cx is showing gpc  Post operative had improved pain although now he is in a more severe spell   Family History  Problem Relation Age of Onset   CAD Father    Kidney disease Father    Parkinson's disease Mother    Anesthesia problems Neg Hx    Broken bones Neg Hx    Cancer Neg Hx    Clotting disorder Neg Hx    Collagen disease Neg Hx    Diabetes Neg Hx    Dislocations Neg Hx    Osteoporosis Neg Hx    Rheumatologic disease Neg Hx    Scoliosis Neg Hx    Severe sprains Neg Hx     Social History   Tobacco Use   Smoking status: Former    Types: Cigarettes    Quit date: 1978    Years since quitting: 44.7   Smokeless tobacco: Never  Vaping Use   Vaping Use: Never used  Substance Use Topics   Alcohol use: Yes    Comment: 8-10 ounces daily   Drug use: No    Allergies  Allergen Reactions   Aricept [Donepezil Hcl] Diarrhea and Other (See Comments)    Diarrhea, stomach problems.    Atropine Swelling and Other (See Comments)    Eyes swollen and red    Review of Systems: ROS All Other ROS was negative, except mentioned above   Past Medical History:  Diagnosis Date   Alcohol abuse  Anxiety    Depression    Gait abnormality 04/08/2018   GERD (gastroesophageal reflux disease)    Hyperlipidemia    Hypertension    Kidney damage    Memory difficulties 04/08/2018       Scheduled Meds:  acetaminophen  1,000 mg Oral Q6H   amLODipine  10 mg Oral Daily   And   irbesartan  150 mg Oral Daily   docusate sodium  100 mg Oral BID   fenofibrate  160 mg Oral Daily   FLUoxetine  20 mg Oral Daily   folic acid  1 mg Oral Daily   insulin aspart  0-20 Units Subcutaneous Q4H   levothyroxine  50 mcg Oral Q0600   memantine  28 mg Oral Daily    mirtazapine  30 mg Oral QHS   multivitamin with minerals  1 tablet Oral Daily   pantoprazole  40 mg Oral Daily   rosuvastatin  10 mg Oral Daily   sodium chloride flush  3 mL Intravenous Q12H   thiamine  100 mg Oral Daily   Continuous Infusions:  sodium chloride     sodium chloride     ceFEPime (MAXIPIME) IV 2 g (08/27/21 0855)   vancomycin     PRN Meds:.acetaminophen **OR** acetaminophen, bisacodyl, cyclobenzaprine, diphenhydrAMINE, menthol-cetylpyridinium **OR** phenol, morphine injection, ondansetron **OR** ondansetron (ZOFRAN) IV, oxyCODONE, oxyCODONE, polyethylene glycol, sodium chloride flush, sodium phosphate   OBJECTIVE: Blood pressure 110/76, pulse 85, temperature 97.6 F (36.4 C), resp. rate 16, SpO2 99 %.  Physical Exam  General/constitutional: no distress, pleasant HEENT: Normocephalic, PER, Conj Clear, EOMI, Oropharynx clear Neck supple CV: rrr no mrg Lungs: clear to auscultation, normal respiratory effort Abd: Soft, Nontender Ext: no edema Skin: No Rash Neuro: nonfocal MSK: surgical site nonocclusive honeycomb dressing in place, mild swelling; no erythema; minimal tenderness Psych: alert/oriented  Lab Results Lab Results  Component Value Date   WBC 8.0 08/27/2021   HGB 9.8 (L) 08/27/2021   HCT 28.0 (L) 08/27/2021   MCV 94.9 08/27/2021   PLT 229 08/27/2021    Lab Results  Component Value Date   CREATININE 1.34 (H) 08/27/2021   BUN 27 (H) 08/27/2021   NA 131 (L) 08/27/2021   K 3.6 08/27/2021   CL 100 08/27/2021   CO2 20 (L) 08/27/2021    Lab Results  Component Value Date   ALT 26 04/23/2018   AST 47 (H) 04/23/2018   ALKPHOS 48 04/23/2018   BILITOT 0.6 04/23/2018      Microbiology: Recent Results (from Brandon past 240 hour(s))  SARS Coronavirus 2 by RT PCR (hospital order, performed in [place] hospital lab) Nasopharyngeal Nasopharyngeal Swab     Status: None   Collection Time: 08/26/21  4:12 PM   Specimen: Nasopharyngeal Swab  Result Value  Ref Range Status   SARS Coronavirus 2 NEGATIVE NEGATIVE Final    Comment: (NOTE) SARS-CoV-2 target nucleic acids are NOT DETECTED.  Brandon SARS-CoV-2 RNA is generally detectable in upper and lower respiratory specimens during Brandon acute phase of infection. Brandon lowest concentration of SARS-CoV-2 viral copies this assay can detect is 250 copies / mL. A negative result does not preclude SARS-CoV-2 infection and should not be used as Brandon sole basis for treatment or other patient management decisions.  A negative result may occur with improper specimen collection / handling, submission of specimen other than nasopharyngeal swab, presence of viral mutation(s) within Brandon areas targeted by this assay, and inadequate number of viral copies (<250 copies / mL). A  negative result must be combined with clinical observations, patient history, and epidemiological information.  Fact Sheet for Patients:   StrictlyIdeas.no  Fact Sheet for Healthcare Providers: BankingDealers.co.za  This test is not yet approved or  cleared by Brandon [place] FDA and has been authorized for detection and/or diagnosis of SARS-CoV-2 by FDA under an Emergency Use Authorization (EUA).  This EUA will remain in effect (meaning this test can be used) for Brandon duration of Brandon COVID-19 declaration under Section 564(b)(1) of Brandon Act, 21 U.S.C. section 360bbb-3(b)(1), unless Brandon authorization is terminated or revoked sooner.  Performed at [facility], [place] [address], [place], [place] [address]   Aerobic/Anaerobic Culture w Gram Stain (surgical/deep wound)     Status: None (Preliminary result)   Collection Time: 08/26/21  5:40 PM   Specimen: Soft Tissue, Other  Result Value Ref Range Status   Specimen Description TISSUE  Final   Special Requests Brandon Fleming WOUND DRAINAGE  Final   Gram Stain   Final    FEW SQUAMOUS EPITHELIAL CELLS PRESENT FEW WBC SEEN MODERATE GRAM POSITIVE  COCCI    Culture   Final    TOO YOUNG TO READ Performed at [facility], [place] [address] [place], [place] [address]    Report Status PENDING  Incomplete     Serology:    Imaging: If present, new imagings (plain films, ct scans, and mri) have been personally visualized and interpreted; radiology reports have been reviewed. Decision making incorporated into Brandon Impression / Recommendations.    Jabier Mutton, [place] for [facility] [place] [phone] pager    08/27/2021, 3:33 PM

## 2021-08-28 ENCOUNTER — Inpatient Hospital Stay: Payer: Self-pay

## 2021-08-28 LAB — BASIC METABOLIC PANEL
Anion gap: 9 (ref 5–15)
BUN: 29 mg/dL — ABNORMAL HIGH (ref 8–23)
CO2: 22 mmol/L (ref 22–32)
Calcium: 8.4 mg/dL — ABNORMAL LOW (ref 8.9–10.3)
Chloride: 105 mmol/L (ref 98–111)
Creatinine, Ser: 1.43 mg/dL — ABNORMAL HIGH (ref 0.61–1.24)
GFR, Estimated: 51 mL/min — ABNORMAL LOW (ref >60)
Glucose, Bld: 104 mg/dL — ABNORMAL HIGH (ref 70–99)
Potassium: 3.1 mmol/L — ABNORMAL LOW (ref 3.5–5.1)
Sodium: 136 mmol/L (ref 135–145)

## 2021-08-28 LAB — GLUCOSE, CAPILLARY
Glucose-Capillary: 72 mg/dL (ref 70–99)
Glucose-Capillary: 89 mg/dL (ref 70–99)
Glucose-Capillary: 82 mg/dL (ref 70–99)
Glucose-Capillary: 121 mg/dL — ABNORMAL HIGH (ref 70–99)

## 2021-08-28 NOTE — Progress Notes (Signed)
Subjective: The patient is alert and pleasant.  His back is appropriately sore.  He wants to go home.  Objective: Vital signs in last 24 hours: Temp:  [97.5 F (36.4 C)-98 F (36.7 C)] 98 F (36.7 C) (09/14 0751) Pulse Rate:  [66-85] 75 (09/14 0751) Resp:  [16-20] 17 (09/14 0751) BP: (100-119)/(60-76) 118/67 (09/14 0751) SpO2:  [99 %-100 %] 100 % (09/14 0751) Estimated body mass index is 24.21 kg/m as calculated from the following:   Height as of 08/14/21: '5\' 6"'$  (1.676 m).   Weight as of 08/14/21: 68 kg.   Intake/Output from previous day: 09/13 0701 - 09/14 0700 In: 60 [P.O.:60] Out: 550 [Urine:550] Intake/Output this shift: No intake/output data recorded.  Physical exam the patient is alert and pleasant.  His strength is normal.  Lab Results: Recent Labs    08/26/21 1624 08/27/21 0449  WBC 10.3 8.0  HGB 9.9* 9.8*  HCT 29.5* 28.0*  PLT 230 229   BMET Recent Labs    08/27/21 0449 08/28/21 0135  NA 131* 136  K 3.6 3.1*  CL 100 105  CO2 20* 22  GLUCOSE 188* 104*  BUN 27* 29*  CREATININE 1.34* 1.43*  CALCIUM 8.6* 8.4*    Studies/Results: [place] EKG SITE RITE  Result Date: 08/28/2021 If Site Rite image not attached, placement could not be confirmed due to current cardiac rhythm.   Assessment/Plan: Postop day #2: The patient's wound cultures gram stain demonstrate gram-positive cocci.  We are awaiting the cultures and sensitivities.  His blood cultures are negative.  I will order a PICC line.  I appreciate IDs help.  Perhaps he can go home tomorrow.  LOS: 2 days     Ophelia Charter 08/28/2021, 10:08 AM     Patient ID: Brandon Fleming, male   DOB: Mar 31, 1946, 75 y.o.   MRN: [id number]

## 2021-08-28 NOTE — TOC Initial Note (Addendum)
Transition of Care [facility]) - Initial/Assessment Note    Patient Details  Fleming: Brandon Fleming MRN: [id number] Date of Birth: 1946/05/11  Transition of Care [facility]) CM/SW Contact:    Brandon Favre, RN Phone Number: 08/28/2021, 12:29 PM  Clinical Narrative:                 Spoke to patient and wife Brandon Fleming at bedside.   Plan at discharge patient will need IV ABX at home.   Explained infusion company will be Brandon Fleming, [employer] , they have a nurse Brandon Fleming who will provide teaching to patient and wife prior to discharge. Patient will have a Brandon Fleming , however that Lithium will not be present every time a dose is due. Wife voiced understanding.   Wife has already received a call from [facility].   NCM made referral to Brandon Fleming with Brandon Fleming and Brandon Fleming with [facility].   Spoke with Brandon Fleming with [facility], they are unable to cover cost of supplies for IV Infusion at home, per Brandon Fleming Brandon Fleming would like [facility] , also per [facility] Brandon Fleming wants NCM to text her. NCM texted awaiting response.   Confirmed with Brandon Fleming with Brandon Fleming he can accept and cover cost of infusion supplies.    Expected Discharge Plan: Brandon Fleming     Patient Goals and CMS Choice Patient states their goals for this hospitalization and ongoing recovery are:: to go home CMS Medicare.gov Compare Post Acute Care list provided to:: Patient Choice offered to / list presented to : Patient, Spouse  Expected Discharge Plan and Services Expected Discharge Plan: [place]   Discharge Planning Services: CM Consult Post Acute Care Choice: [place] arrangements for the past 2 months: Single Family Home                 DME Arranged: N/A DME Agency: NA       HH Arranged: RN, IV Antibiotics HH Agency: [place] Date [place]: 08/28/21 Time [place]: 1228 Representative spoke with at [place]: Brandon Fleming Arrangements/Services Living arrangements for the  past 2 months: [place] with:: Spouse Patient language and need for interpreter reviewed:: Yes Do you feel safe going back to the place where you live?: Yes      Need for Family Participation in Patient Care: Yes (Comment) Care giver support system in place?: Yes (comment)   Criminal Activity/Legal Involvement Pertinent to Current Situation/Hospitalization: Brandon - Comment as needed  Activities of Daily Living Home Assistive Devices/Equipment: Eyeglasses, Blood pressure cuff, Grab bars around toilet, Raised toilet seat with rails ADL Screening (condition at time of admission) Patient's cognitive ability adequate to safely complete daily activities?: Yes Is the patient deaf or have difficulty hearing?: Brandon Does the patient have difficulty seeing, even when wearing glasses/contacts?: Brandon Does the patient have difficulty concentrating, remembering, or making decisions?: Yes Patient able to express need for assistance with ADLs?: Yes Does the patient have difficulty dressing or bathing?: Yes Independently performs ADLs?: Yes (appropriate for developmental age) Does the patient have difficulty walking or climbing stairs?: Yes Weakness of Legs: Both Weakness of Arms/Hands: None  Permission Sought/Granted   Permission granted to share information with : Yes, Verbal Permission Granted  Share Information with Fleming: Brandon Fleming Fleming           Emotional Assessment Appearance:: Appears stated age Attitude/Demeanor/Rapport: Engaged Affect (typically observed): Accepting   Alcohol /  Substance Use: Not Applicable Psych Involvement: Brandon (comment)  Admission diagnosis:  Wound infection complicating hardware ([place]) [id number]] Postoperative complication of skin involving drainage from surgical wound [L76.82] Patient Active Problem List   Diagnosis Date Noted   Wound infection complicating hardware ([place]) 08/26/2021   Postoperative complication of skin involving drainage  from surgical wound 08/26/2021   Lumbar adjacent segment disease with spondylolisthesis 08/14/2021   Insomnia 04/19/2020   Dislocated intraocular lens 05/18/2018   Dislocated intraocular lens, sequela 05/05/2018   Spondylolisthesis of lumbar region 05/05/2018   Memory difficulties 04/08/2018   Gait abnormality 04/08/2018   Alcohol withdrawal ([place]) 02/28/2018   Weakness 02/26/2018   Essential hypertension 02/26/2018   HLD (hyperlipidemia) 02/26/2018   Tremors of nervous system 02/26/2018   Alcohol abuse 02/26/2018   Weakness generalized 02/26/2018   PCP:  Prince Solian, MD Pharmacy:   [id number] [id number]: [phone]: [phone]    Social Determinants of Health (SDOH) Interventions    Readmission Risk Interventions Brandon flowsheet data found.

## 2021-08-28 NOTE — Progress Notes (Signed)
Patient's left arm noted to be swollen, itchy, with pink patchy area. Area in question marked and ID physician notified and aware. No new orders at this time.

## 2021-08-28 NOTE — Progress Notes (Signed)
Peripherally Inserted Central Catheter Placement  The IV Nurse has discussed with the patient and/or persons authorized to consent for the patient, the purpose of this procedure and the potential benefits and risks involved with this procedure.  The benefits include less needle sticks, lab draws from the catheter, and the patient may be discharged home with the catheter. Risks include, but not limited to, infection, bleeding, blood clot (thrombus formation), and puncture of an artery; nerve damage and irregular heartbeat and possibility to perform a PICC exchange if needed/ordered by physician.  Alternatives to this procedure were also discussed.  Bard Power PICC patient education guide, fact sheet on infection prevention and patient information card has been provided to patient /or left at bedside.    PICC Placement Documentation  PICC Single Lumen [id number] Right Basilic 38 cm 0 cm (Active)  Indication for Insertion or Continuance of Line Home intravenous therapies (PICC only) 08/28/21 1240  Exposed Catheter (cm) 0 cm 08/28/21 1240  Site Assessment Clean;Dry;Intact 08/28/21 1240  Line Status Flushed;Blood return noted;Saline locked 08/28/21 1240  Dressing Type Transparent 08/28/21 1240  Dressing Status Clean;Intact;Dry 08/28/21 1240  Antimicrobial disc in place? Yes 08/28/21 1240  Dressing Change Due 09/04/21 08/28/21 1240       Scotty Court 08/28/2021, 12:42 PM

## 2021-08-29 DIAGNOSIS — L7682 Other postprocedural complications of skin and subcutaneous tissue: Secondary | ICD-10-CM | POA: Diagnosis not present

## 2021-08-29 DIAGNOSIS — T847XXD Infection and inflammatory reaction due to other internal orthopedic prosthetic devices, implants and grafts, subsequent encounter: Secondary | ICD-10-CM

## 2021-08-29 LAB — GLUCOSE, CAPILLARY
Glucose-Capillary: 84 mg/dL (ref 70–99)
Glucose-Capillary: 99 mg/dL (ref 70–99)
Glucose-Capillary: 134 mg/dL — ABNORMAL HIGH (ref 70–99)
Glucose-Capillary: 162 mg/dL — ABNORMAL HIGH (ref 70–99)

## 2021-08-29 LAB — BASIC METABOLIC PANEL
Anion gap: 7 (ref 5–15)
BUN: 26 mg/dL — ABNORMAL HIGH (ref 8–23)
CO2: 20 mmol/L — ABNORMAL LOW (ref 22–32)
Calcium: 8.3 mg/dL — ABNORMAL LOW (ref 8.9–10.3)
Chloride: 109 mmol/L (ref 98–111)
Creatinine, Ser: 1.59 mg/dL — ABNORMAL HIGH (ref 0.61–1.24)
GFR, Estimated: 45 mL/min — ABNORMAL LOW (ref >60)
Glucose, Bld: 117 mg/dL — ABNORMAL HIGH (ref 70–99)
Potassium: 3.2 mmol/L — ABNORMAL LOW (ref 3.5–5.1)
Sodium: 136 mmol/L (ref 135–145)

## 2021-08-29 MED ORDER — CEFAZOLIN SODIUM-DEXTROSE 2-4 GM/100ML-% IV SOLN
2.0000 g | Freq: Three times a day (TID) | INTRAVENOUS | Status: DC
  Administered 2021-08-29 – 2021-08-31 (×7): 2 g via INTRAVENOUS
  Filled 2021-08-29 (×8): qty 100

## 2021-08-29 MED ORDER — CEFAZOLIN SODIUM-DEXTROSE 2-4 GM/100ML-% IV SOLN: 2.0000 g | Freq: Three times a day (TID) | INTRAVENOUS | Status: DC

## 2021-08-29 MED ORDER — PREDNISONE 20 MG PO TABS
20.0000 mg | ORAL_TABLET | Freq: Every day | ORAL | Status: DC
  Administered 2021-08-29 – 2021-08-31 (×3): 20 mg via ORAL
  Filled 2021-08-29 (×4): qty 1

## 2021-08-29 MED ORDER — CHLORHEXIDINE GLUCONATE CLOTH 2 % EX PADS
6.0000 | MEDICATED_PAD | Freq: Every day | CUTANEOUS | Status: DC
  Administered 2021-08-29 – 2021-08-31 (×3): 6 via TOPICAL

## 2021-08-29 MED ORDER — SODIUM CHLORIDE 0.9% FLUSH: 10.0000 mL | INTRAVENOUS | Status: DC | PRN

## 2021-08-29 MED ORDER — HYDROMORPHONE HCL 1 MG/ML IJ SOLN
1.0000 mg | INTRAMUSCULAR | Status: DC | PRN
  Administered 2021-08-31 (×2): 1 mg via INTRAVENOUS
  Filled 2021-08-29 (×2): qty 1

## 2021-08-29 NOTE — Consult Note (Signed)
   [facility] Inpatient Consult   08/29/2021  [name] 1946-04-16 [id number]  [place] Organization [ACO] Patient:  Medicare CMS DCE  Primary Care Provider:  Prince Solian, MD, [facility], is listed to provide the Transition of Care follow up.  Patient screened for less than 30 days readmission hospitalization.   Review of patient's medical record with inpatient TOC reveals patient is recommended for home with Home Health care.   Plan:  Continue to follow progress and disposition to assess for post hospital care management needs.  No current [facility] Care Management needs assessed for transition at this time.  For questions contact:   Natividad Brood, RN BSN [facility] Liaison  [phone] business mobile phone Toll free office [phone]  Fax number: [phone] [email or web address]

## 2021-08-29 NOTE — Progress Notes (Signed)
PHARMACY CONSULT NOTE FOR:  OUTPATIENT  PARENTERAL ANTIBIOTIC THERAPY (OPAT)  Indication: Surgical site/vertebral osteomyelitis  Regimen: Cefazolin 2 gm IV q 8 hours  End date: 10/07/21  Also providing rifampin X 3 months at discharge   IV antibiotic discharge orders are pended. To discharging provider:  please sign these orders via discharge navigator,  Select New Orders & click on the button choice - Manage This Unsigned Work.     Thank you for allowing pharmacy to be a part of this patient's care.  Jimmy Footman, PharmD, BCPS, [place] [facility] Clinical Pharmacist Phone: [phone] 08/29/2021, 3:03 PM

## 2021-08-29 NOTE — Care Management Important Message (Signed)
Important Message  Patient Details  Name: Brandon Fleming MRN: [id number] Date of Birth: 09/05/1946   Medicare Important Message Given:  Yes     Orbie Pyo 08/29/2021, 3:47 PM

## 2021-08-29 NOTE — Progress Notes (Signed)
[facility]  Date of Admission:  08/26/2021       Lines: 9/14-c rue picc  Abx: 9/15-c cefazolin  9/12-15 vanc 9/12-15 cefepime  ASSESSMENT: Surgical site infection/vertebral om lumbar Hardware complicating wound infection Rash suspect abx allergy to cefepime, less likely vanc  75 yo male hx l4-5 fusion 2019 with recurrent back pain/neurogenic claudication, s/p on 8/31 L3-4 laminotomy/facetectomy (complex surgical procedure due to prior lumbar intervody fusion), right L3-4 interbody fusion and zimmer DBM and insertion interbody prosthesis, posterior L3-5 instrumentation, however complicated most recently by early surgical site infection admitted 9/12 for I&D with retention hardware   Reviewed 9/12 operative note. Lot of pus in sq space; compromised lumbosacral fascia with pus tracking down to spine/hardware. Wound cx mssa   No sepsis this admission otherwise. No bcx obtained.   Given recent hardware placement <30 days, if this is staph aureus, would be reasonable to consider addition of rifampin  Rash likely abx allergy more likely due to cefepime rather than vanc. Given wound cx susceptibility available will switch to cefazolin today 9/15  PLAN: Cefazolin 2 gram iv q8hrs; stop vanc/cefepime Cotninue prn benadril; given degree of rash, start pred 20 mg po daily for the next 5-7 days On discharge, please also add rifampin 300 mg po bid If rash improve, can discharge on iv cefazolin Opat & id f/u as below     OPAT Orders Discharge antibiotics to be given via PICC line Discharge antibiotics: cefazolin Duration: 6 weeks iv cefazolin End Date: 9/12-10/24  On 10/24 will need to transition iv cefazolin to PO cefadroxil 1 gram bid for chronic suppression  Rifampin will be continued for 3 months from discharge 300 mg po bid  [facility] Care Per Protocol:  Home health RN for IV administration and teaching; PICC line care and labs.    Labs  weekly while on IV antibiotics: _x_ CBC with differential __ BMP _x_ CMP _x_ CRP __ ESR __ Vancomycin trough __ CK  _x_ Please pull PIC at completion of IV antibiotics __ Please leave PIC in place until doctor has seen patient or been notified  Fax weekly labs to [phone]  Clinic Follow Up Appt: 10/04 @ 9am  @  [facility] [place], [place], [place] [address] Phone: [phone]   Active Problems:   Wound infection complicating hardware ([place])   Postoperative complication of skin involving drainage from surgical wound   Allergies  Allergen Reactions   Aricept [Donepezil Hcl] Diarrhea and Other (See Comments)    Diarrhea, stomach problems.    Atropine Swelling and Other (See Comments)    Eyes swollen and red   Other Other (See Comments)    Moisturizing eye drop - unknown    Scheduled Meds:  amLODipine  10 mg Oral Daily   And   irbesartan  150 mg Oral Daily   Chlorhexidine Gluconate Cloth  6 each Topical Daily   docusate sodium  100 mg Oral BID   fenofibrate  160 mg Oral Daily   FLUoxetine  20 mg Oral Daily   folic acid  1 mg Oral Daily   insulin aspart  0-20 Units Subcutaneous TID WC   insulin aspart  0-5 Units Subcutaneous QHS   levothyroxine  50 mcg Oral Q0600   memantine  28 mg Oral Daily   mirtazapine  30 mg Oral QHS   multivitamin with minerals  1 tablet Oral Daily   pantoprazole  40 mg Oral Daily   predniSONE  20 mg Oral Q breakfast   rosuvastatin  10 mg Oral Daily   sodium chloride flush  3 mL Intravenous Q12H   thiamine  100 mg Oral Daily   Continuous Infusions:  sodium chloride 75 mL/hr at 08/29/21 0941   sodium chloride     vancomycin 1,000 mg (08/28/21 2309)   PRN Meds:.acetaminophen **OR** acetaminophen, bisacodyl, cyclobenzaprine, diphenhydrAMINE, menthol-cetylpyridinium **OR** phenol, morphine injection, ondansetron **OR** ondansetron (ZOFRAN) IV, oxyCODONE, oxyCODONE, polyethylene glycol, sodium chloride flush, sodium  chloride flush, sodium phosphate   SUBJECTIVE: Rash onset 9/14 lue then entire body today; no mucosa involvement Minimal pain lumbar area No n/v/diarrhea No chest pain/cough/dyspnea No abd pain  Review of Systems: ROS All other ROS was negative, except mentioned above     OBJECTIVE: Vitals:   08/28/21 2145 08/29/21 0513 08/29/21 0800 08/29/21 1019  BP: 112/62 118/65 92/68 122/71  Pulse:  93 98 87  Resp:   18 15  Temp:  98.2 F (36.8 C) 97.8 F (36.6 C)   TempSrc:  Oral Oral   SpO2:  100% 100% 100%   There is no height or weight on file to calculate BMI.  Physical Exam General/constitutional: no distress, pleasant HEENT: Normocephalic, PER, Conj Clear, EOMI, Oropharynx clear Neck supple CV: rrr no mrg Lungs: clear to auscultation, normal respiratory effort Abd: Soft, Nontender Ext: no edema Skin: targetoid erythematous rash on trunk/ext Neuro: nonfocal MSK: lumbar surgical site no tenderness/dehiscence/erythema/fluctuance   Central line presence: rue picc site no erythema or purulence   Lab Results Lab Results  Component Value Date   WBC 8.0 08/27/2021   HGB 9.8 (L) 08/27/2021   HCT 28.0 (L) 08/27/2021   MCV 94.9 08/27/2021   PLT 229 08/27/2021    Lab Results  Component Value Date   CREATININE 1.59 (H) 08/29/2021   BUN 26 (H) 08/29/2021   NA 136 08/29/2021   K 3.2 (L) 08/29/2021   CL 109 08/29/2021   CO2 20 (L) 08/29/2021    Lab Results  Component Value Date   ALT 26 04/23/2018   AST 47 (H) 04/23/2018   ALKPHOS 48 04/23/2018   BILITOT 0.6 04/23/2018      Microbiology: Recent Results (from the past 240 hour(s))  SARS Coronavirus 2 by RT PCR (hospital order, performed in [place] hospital lab) Nasopharyngeal Nasopharyngeal Swab     Status: None   Collection Time: 08/26/21  4:12 PM   Specimen: Nasopharyngeal Swab  Result Value Ref Range Status   SARS Coronavirus 2 NEGATIVE NEGATIVE Final    Comment: (NOTE) SARS-CoV-2 target nucleic  acids are NOT DETECTED.  The SARS-CoV-2 RNA is generally detectable in upper and lower respiratory specimens during the acute phase of infection. The lowest concentration of SARS-CoV-2 viral copies this assay can detect is 250 copies / mL. A negative result does not preclude SARS-CoV-2 infection and should not be used as the sole basis for treatment or other patient management decisions.  A negative result may occur with improper specimen collection / handling, submission of specimen other than nasopharyngeal swab, presence of viral mutation(s) within the areas targeted by this assay, and inadequate number of viral copies (<250 copies / mL). A negative result must be combined with clinical observations, patient history, and epidemiological information.  Fact Sheet for Patients:   StrictlyIdeas.no  Fact Sheet for Healthcare Providers: BankingDealers.co.za  This test is not yet approved or  cleared by the [place] FDA and has been authorized  for detection and/or diagnosis of SARS-CoV-2 by FDA under an Emergency Use Authorization (EUA).  This EUA will remain in effect (meaning this test can be used) for the duration of the COVID-19 declaration under Section 564(b)(1) of the Act, 21 U.S.C. section 360bbb-3(b)(1), unless the authorization is terminated or revoked sooner.  Performed at [facility], [place] [address], [place], [place] [address]   Aerobic/Anaerobic Culture w Gram Stain (surgical/deep wound)     Status: None (Preliminary result)   Collection Time: 08/26/21  5:40 PM   Specimen: Soft Tissue, Other  Result Value Ref Range Status   Specimen Description TISSUE  Final   Special Requests LUMBAR WOUND DRAINAGE  Final   Gram Stain   Final    FEW SQUAMOUS EPITHELIAL CELLS PRESENT FEW WBC SEEN MODERATE GRAM POSITIVE COCCI Performed at [facility], [address] [place], [place] [address]    Culture   Final     ABUNDANT STAPHYLOCOCCUS AUREUS NO ANAEROBES ISOLATED; CULTURE IN PROGRESS FOR 5 DAYS    Report Status PENDING  Incomplete   Organism ID, Bacteria STAPHYLOCOCCUS AUREUS  Final      Susceptibility   Staphylococcus aureus - MIC*    CIPROFLOXACIN <=0.5 SENSITIVE Sensitive     ERYTHROMYCIN <=0.25 SENSITIVE Sensitive     GENTAMICIN <=0.5 SENSITIVE Sensitive     OXACILLIN 0.5 SENSITIVE Sensitive     TETRACYCLINE <=1 SENSITIVE Sensitive     VANCOMYCIN <=0.5 SENSITIVE Sensitive     TRIMETH/SULFA <=10 SENSITIVE Sensitive     CLINDAMYCIN <=0.25 SENSITIVE Sensitive     RIFAMPIN <=0.5 SENSITIVE Sensitive     Inducible Clindamycin NEGATIVE Sensitive     * ABUNDANT STAPHYLOCOCCUS AUREUS  Culture, blood (routine x 2)     Status: None (Preliminary result)   Collection Time: 08/27/21  8:50 PM   Specimen: BLOOD  Result Value Ref Range Status   Specimen Description BLOOD RIGHT ANTECUBITAL  Final   Special Requests   Final    BOTTLES DRAWN AEROBIC AND ANAEROBIC Blood Culture adequate volume   Culture   Final    NO GROWTH 2 DAYS Performed at [facility], [address], [place], [place] [address]    Report Status PENDING  Incomplete  Culture, blood (routine x 2)     Status: None (Preliminary result)   Collection Time: 08/27/21  8:50 PM   Specimen: BLOOD RIGHT HAND  Result Value Ref Range Status   Specimen Description BLOOD RIGHT HAND  Final   Special Requests   Final    BOTTLES DRAWN AEROBIC AND ANAEROBIC Blood Culture adequate volume   Culture   Final    NO GROWTH 2 DAYS Performed at [facility], [place] [address], [place], [place] [address]    Report Status PENDING  Incomplete     Serology:   Imaging: If present, new imagings (plain films, ct scans, and mri) have been personally visualized and interpreted; radiology reports have been reviewed. Decision making incorporated into the Impression / Recommendations.   Jabier Mutton, [place] [facility] [place] [phone] pager    08/29/2021, 2:16 PM

## 2021-08-29 NOTE — Progress Notes (Signed)
   Providing Compassionate, Quality Care - Together   Subjective: Patient reports pruritic rash that started on his left arm yesterday. It is now all over his body. He tells me he just received Benadryl about an hour ago  Objective: Vital signs in last 24 hours: Temp:  [97.7 F (36.5 C)-98.2 F (36.8 C)] 97.8 F (36.6 C) (09/15 0800) Pulse Rate:  [87-98] 87 (09/15 1019) Resp:  [15-18] 15 (09/15 1019) BP: (91-152)/(56-122) 122/71 (09/15 1019) SpO2:  [78 %-100 %] 100 % (09/15 1019)  Intake/Output from previous day: 09/14 0701 - 09/15 0700 In: 2113.3 [P.O.:680; I.V.:522.9; IV Piggyback:910.3] Out: 1375 [Urine:1375] Intake/Output this shift: Total I/O In: 240 [P.O.:240] Out: 350 [Urine:350]  Alert and oriented x 4 PERRLA CN II-XII grossly intact MAE, Strength and sensation intact Incision is covered with Honeycomb dressing Dressing removed and replaced so wound could be assessed The incision is closed with a running nylon suture. Swelling is much improved. Mild erythema. Minimal drainage.   Lab Results: Recent Labs    08/26/21 1624 08/27/21 0449  WBC 10.3 8.0  HGB 9.9* 9.8*  HCT 29.5* 28.0*  PLT 230 229   BMET Recent Labs    08/28/21 0135 08/29/21 0501  NA 136 136  K 3.1* 3.2*  CL 105 109  CO2 22 20*  GLUCOSE 104* 117*  BUN 29* 26*  CREATININE 1.43* 1.59*  CALCIUM 8.4* 8.3*    Studies/Results: [place] EKG SITE RITE  Result Date: 08/28/2021 If Site Rite image not attached, placement could not be confirmed due to current cardiac rhythm.   Assessment/Plan: The patient underwent an extensive lumbar fusion on 08/14/2021.  The patient began having drainage from his wound on 08/23/2021. He was taken to the OR by Dr. Arnoldo Morale on 08/26/2021. Culture is growing Staph aureus. Blood cultures negative. PICC line placed 08/28/2021. Patient developed rash on 08/28/2021.   LOS: 3 days   -Plan is for discharge home with home health -ID has addressed what is likely a reaction to  the Cefepime. Patient has been started on prednisone and Benadryl for the pruritic lesions. He will need to stay in the hospital for another day or two to ensure his reaction symptoms do not worsen. -Morphine has been switched to Dilaudid to see if this provides better pain control.   Viona Gilmore, DNP, AGNP-C Nurse Practitioner  [facility] [place] [address], [address], [place], [place] [address] P: [phone]    F: [phone]  08/29/2021, 11:37 AM

## 2021-08-29 NOTE — TOC Progression Note (Signed)
Transition of Care [facility]) - Progression Note    Patient Details  Name: SAY FRIPP MRN: [id number] Date of Birth: 09/08/1946  [facility] CM/SW Contact  Dnya Hickle, Edson Snowball, RN Phone Number: 08/29/2021, 8:49 AM  Clinical Narrative:    Confirmed with Mrs Renna , they would like [facility].   Tommi Rumps with [facility] accepted referral for [facility]   Expected Discharge Plan: [place]    Expected Discharge Plan and Services Expected Discharge Plan: [place]   Discharge Planning Services: CM Consult Post Acute Care Choice: [place] arrangements for the past 2 months: Single Family Home                 DME Arranged: N/A DME Agency: NA       HH Arranged: RN, IV Antibiotics HH Agency: [place] Date [place]: 08/28/21 Time [place]: 1228 Representative spoke with at [place]: Meg   Social Determinants of Health ([place]) Interventions    Readmission Risk Interventions No flowsheet data found.

## 2021-08-29 NOTE — Plan of Care (Signed)
  Problem: Pain Management: Goal: Pain level will decrease 08/29/2021 1748 by Samule Dry A, RN Outcome: Progressing 08/29/2021 1747 by Derrill Kay, RN Outcome: Progressing

## 2021-08-30 DIAGNOSIS — L7682 Other postprocedural complications of skin and subcutaneous tissue: Secondary | ICD-10-CM | POA: Diagnosis not present

## 2021-08-30 DIAGNOSIS — T847XXD Infection and inflammatory reaction due to other internal orthopedic prosthetic devices, implants and grafts, subsequent encounter: Secondary | ICD-10-CM | POA: Diagnosis not present

## 2021-08-30 LAB — GLUCOSE, CAPILLARY
Glucose-Capillary: 114 mg/dL — ABNORMAL HIGH (ref 70–99)
Glucose-Capillary: 140 mg/dL — ABNORMAL HIGH (ref 70–99)
Glucose-Capillary: 117 mg/dL — ABNORMAL HIGH (ref 70–99)
Glucose-Capillary: 137 mg/dL — ABNORMAL HIGH (ref 70–99)

## 2021-08-30 MED ORDER — CEFAZOLIN IV (FOR PTA / DISCHARGE USE ONLY): 2.0000 g | Freq: Three times a day (TID) | INTRAVENOUS | 0 refills | Status: AC

## 2021-08-30 MED ORDER — RIFAMPIN 300 MG PO CAPS: 300.0000 mg | ORAL_CAPSULE | Freq: Two times a day (BID) | ORAL | 2 refills | Status: DC

## 2021-08-30 NOTE — Progress Notes (Signed)
   Providing Compassionate, Quality Care - Together   Subjective: Patient and nurse report significant serosanguinous drainage overnight. Honeycomb dressing was saturated. Patient and his wife have concerns about mobilization. Wife is worried the patient won't be able to get around at home. NT reports that patient has been standing independently at bedside to void.  Objective: Vital signs in last 24 hours: Temp:  [97.5 F (36.4 C)-98.3 F (36.8 C)] 98 F (36.7 C) (09/16 0800) Pulse Rate:  [80-87] 84 (09/16 0800) Resp:  [18] 18 (09/16 0800) BP: (107-133)/(65-77) 126/76 (09/16 0800) SpO2:  [99 %-100 %] 100 % (09/16 0800)  Intake/Output from previous day: 09/15 0701 - 09/16 0700 In: 2923.7 [P.O.:680; I.V.:1943.8; IV Piggyback:299.9] Out: 1600 [Urine:1600] Intake/Output this shift: No intake/output data recorded.  Alert and oriented x 4 PERRLA CN II-XII grossly intact MAE, Strength and sensation intact Incision is covered with gauze dressing Dressing removed and replaced so wound could be assessed The incision is closed with a running nylon suture. Swelling is much improved. Mild erythema. Minimal drainage (nurse had just changed the dressing).   Lab Results: No results for input(s): WBC, HGB, HCT, PLT in the last 72 hours. BMET Recent Labs    08/28/21 0135 08/29/21 0501  NA 136 136  K 3.1* 3.2*  CL 105 109  CO2 22 20*  GLUCOSE 104* 117*  BUN 29* 26*  CREATININE 1.43* 1.59*  CALCIUM 8.4* 8.3*    Studies/Results: No results found.  Assessment/Plan: The patient underwent an extensive lumbar fusion on 08/14/2021.  The patient began having drainage from his wound on 08/23/2021. He was taken to the OR by Dr. Arnoldo Morale on 08/26/2021. Culture is growing Staph aureus. Blood cultures negative. PICC line placed 08/28/2021. Patient developed rash on 08/28/2021. Cefepime stopped and cefazolin added. Rash is much improved.   LOS: 4 days   -Will have PT and OT reassess patient.  Hopefully, patient can discharge home tomorrow. -Keep surgical wound dry. Order added for dressing changes.  Viona Gilmore, DNP, AGNP-C Nurse Practitioner  [facility] [place] [address], [place], [place] [address] P: [phone]    F: [phone]  08/30/2021, 11:05 AM

## 2021-08-30 NOTE — TOC Progression Note (Addendum)
Transition of Care [facility]) - Progression Note    Patient Details  Name: Brandon Fleming MRN: [id number] Date of Birth: 09/03/1946  Transition of Care [facility]) CM/SW [place], RN Phone Number: 08/30/2021, 11:03 AM  Clinical Narrative:     Spoke to wife at bedside on phone with patients verbal permission. . Wife Aretha Parrot is very concerned about him going home and not getting the things she needed. She voiced she has not seen Infusion educator yet, they were due at 1030. She voiced understanding that the RN would not be there on every infusion, but she would like to talk to [facility] prior to DC. She stated she did not have a "choice" in home health agency. Previous CM notes indicate [facility] was first choice but could not accept. [facility] note) she voiced concern over "what happened when the patient first had surgery, a CM talked to me for 5 minutes and texted once. " I allayed her concerns by going over plan, and voiced we are all here to make sure she gets what she needs to take care of her husband.  She has DME a thome including a walker, and BSC, may need a tub bench or shower seat.   Plan: PT OT re-evaluation, wife states patient has not gotten up since his surgery  Add those recommendations to [facility] if applicable. Follow up with Pam to ensure teaching done  Message RN for assistance with wound care teaching  Have [facility] representative call wife to explain resources and visits, when they plan on visiting.   CM will touch base with wife and wife has this RNCM number for any additional concerns.   15 Spoke to Pam from [facility], she attempted to do teaching last night, but when she arrived the patients wife stated she could not stay, Pam then said she was coming back today after 1030 to do the teaching. She will contact Wife. Spoke to [place], who will contact wife and explain HH aspect.   Expected Discharge Plan: [place]    Expected Discharge Plan and  Services Expected Discharge Plan: [place]   Discharge Planning Services: CM Consult Post Acute Care Choice: [place] arrangements for the past 2 months: Single Family Home                 DME Arranged: N/A DME Agency: NA       HH Arranged: RN, IV Antibiotics HH Agency: [place] Date [facility] Agency Contacted: 08/28/21 Time [place]: 1228 Representative spoke with at [place]: Meg   Social Determinants of Health ([place]) Interventions    Readmission Risk Interventions No flowsheet data found.

## 2021-08-30 NOTE — Progress Notes (Signed)
Occupational Therapy Treatment Patient Details Name: Brandon Fleming MRN: [id number] DOB: 12-25-1945 Today's Date: 08/30/2021   History of present illness 75 y.o. male presenting to [facility] for infection of recent lumbar fusion on 08/14/2021, he is now s/p I&D 9/12. PMH significant for previous lumber surgery in 2019, anxiety/depression, HLD, HTN and memory loss.   OT comments  Pt with progress towards goals this session. Pt recalled 2/3 back precautions when prompted and required min verbal cues to remember 3rd. Pt participated in functional ambulation to sink with RW and grooming at bathroom sink in standing this session. Pt required min verbal cues throughout to adhere to back precautions and for safety. Pt becoming upset and with verbal outburst during session due to medication delay and discharge date expectancy. Due to pt progress and current functional status, continuing to recommend home with 24 hr supervision assistance upon d/c.   Recommendations for follow up therapy are one component of a multi-disciplinary discharge planning process, led by the attending physician.  Recommendations may be updated based on patient status, additional functional criteria and insurance authorization.    Follow Up Recommendations  No OT follow up;Supervision/Assistance - 24 hour    Equipment Recommendations  None recommended by OT    Recommendations for Other Services      Precautions / Restrictions Precautions Precautions: Back;Fall Precaution Booklet Issued:  (pt has already received booklet) Precaution Comments: Reviewed precautions verbally. Required Braces or Orthoses: Spinal Brace Spinal Brace: Lumbar corset Restrictions Weight Bearing Restrictions: No       Mobility Bed Mobility Overal bed mobility: Needs Assistance Bed Mobility: Sidelying to Sit;Rolling;Sit to Sidelying Rolling: Supervision Sidelying to sit: Supervision     Sit to sidelying: Supervision General bed mobility  comments: Pt with poor recall of logrolling technique, re-educated on rolling and pushing up into sitting, pt did not correct with verbal cues.    Transfers Overall transfer level: Needs assistance Equipment used: Rolling walker (2 wheeled) Transfers: Sit to/from Stand Sit to Stand: Min guard              Balance Overall balance assessment: Needs assistance Sitting-balance support: No upper extremity supported;Feet supported Sitting balance-Leahy Scale: Good Sitting balance - Comments: Pt sat EOB with no UE support and feet on floor to donn pants in figure four position with no LOBs.   Standing balance support: Bilateral upper extremity supported Standing balance-Leahy Scale: Good Standing balance comment: Able to stand at sink and complete Oral care with no UE support.                           ADL either performed or assessed with clinical judgement   ADL Overall ADL's : Needs assistance/impaired     Grooming: Oral care;Standing;Min guard Grooming Details (indicate cue type and reason): standing at the sink, required Min verbal cues to remind to use cups to avoid bending.             Lower Body Dressing: Supervision/safety Lower Body Dressing Details (indicate cue type and reason): Pt completed LB dressing in the figure four position sitting EOB, supervision for safety. Toilet Transfer: Magazine features editor Details (indicate cue type and reason): Pt required min guard for safety and min verbal cues to remember to use RW.         Functional mobility during ADLs: Min guard;Cueing for safety;Rolling walker General ADL Comments: Pt with no LOBs during sit<>stand and functional ambulation to complete oral care  at bathroom sink. Pt required min verbal cues throughout for safety and to adhere to precautions. Pt becoming upset during session about delay in recieving medications, pt walking back to bed without RW and waiting for therapist to grab IV pole.      Vision   Vision Assessment?: No apparent visual deficits   Perception     Praxis      Cognition Arousal/Alertness: Awake/alert Behavior During Therapy: Agitated Overall Cognitive Status: Within Functional Limits for tasks assessed                                 General Comments: Pt able to recall 2/3 back precautions and required 2 vcs to recall 3rd. Pt with difficulty recalling spinal precautions and was twisting in bed upon arrival. Pt with verbal outburst during therapy session about medication delay.        Exercises     Shoulder Instructions       General Comments Wife present during session. Pt with verbal outburst and cursing during session. Pt expressed therapist could not assist in any way to make him less frustrated. Pt with profanity and yelling at wife.     Pertinent Vitals/ Pain       Pain Assessment: Faces Pain Score: 4  Pain Location: back Pain Descriptors / Indicators: Discomfort;Guarding Pain Intervention(s): Monitored during session  Home Living                                          Prior Functioning/Environment              Frequency  Min 2X/week        Progress Toward Goals  OT Goals(current goals can now be found in the care plan section)  Progress towards OT goals: Progressing toward goals  Acute Rehab OT Goals Patient Stated Goal: Hoping to return home OT Goal Formulation: With patient Time For Goal Achievement: 09/10/21 Potential to Achieve Goals: Good ADL Goals Pt Will Perform Grooming: with modified independence;standing Pt Will Perform Lower Body Dressing: with modified independence;sit to/from stand Pt Will Transfer to Toilet: with modified independence;ambulating;regular height toilet Additional ADL Goal #1: Pt will independently recall back precautions and brace wear schedule to prepare for safe d/c home  Plan Discharge plan remains appropriate    Co-evaluation                  AM-PAC OT "6 Clicks" Daily Activity     Outcome Measure   Help from another person eating meals?: None Help from another person taking care of personal grooming?: A Little Help from another person toileting, which includes using toliet, bedpan, or urinal?: A Little Help from another person bathing (including washing, rinsing, drying)?: A Little Help from another person to put on and taking off regular upper body clothing?: A Little Help from another person to put on and taking off regular lower body clothing?: A Little 6 Click Score: 19    End of Session Equipment Utilized During Treatment: Rolling walker  OT Visit Diagnosis: Unsteadiness on feet (R26.81);Pain;Dizziness and giddiness (R42) Pain - part of body:  (Back)   Activity Tolerance Patient tolerated treatment well   Patient Left in bed;with call bell/phone within reach   Nurse Communication Mobility status        Time: [id number] OT Time Calculation (  min): 18 min  Charges: OT General Charges $OT Visit: 1 Visit OT Treatments $Self Care/Home Management : 8-22 mins  Jackquline Denmark, OTS Acute Rehab Office: [phone]   Litha Lamartina 08/30/2021, 4:45 PM

## 2021-08-30 NOTE — Progress Notes (Signed)
Physical Therapy Treatment Patient Details Name: Brandon Fleming MRN: [id number] DOB: February 24, 1946 Today's Date: 08/30/2021   History of Present Illness 75 y.o. male presenting to [facility] for infection of recent lumbar fusion on 08/14/2021, he is now s/p I&D 9/12. PMH significant for previous lumber surgery in 2019, anxiety/depression, HLD, HTN and memory loss.    PT Comments    Pt tolerates treatment well, ambulating for increased distances this session. Pt does continue to demonstrate balance deficits, with 2 losses of balance noted during session. PT recommends use of walker for mobility outdoors to improve stability on uneven surfaces. PT also encourages multiple supervised bouts of ambulation out of the room daily during admission. PT recommends outpatient PT for continued balance training.   Recommendations for follow up therapy are one component of a multi-disciplinary discharge planning process, led by the attending physician.  Recommendations may be updated based on patient status, additional functional criteria and insurance authorization.  Follow Up Recommendations  Outpatient PT (balance)     Equipment Recommendations  None recommended by PT (pt owns RW and necessary DME)    Recommendations for Other Services       Precautions / Restrictions Precautions Precautions: Back;Fall Precaution Booklet Issued:  (pt has already received booklet) Precaution Comments: Reviewed precautions verbally. Pt reports he has handout from prior admission. Required Braces or Orthoses: Spinal Brace Spinal Brace: Lumbar corset;Applied in standing position Restrictions Weight Bearing Restrictions: No     Mobility  Bed Mobility Overal bed mobility: Needs Assistance Bed Mobility: Rolling;Sidelying to Sit Rolling: Independent Sidelying to sit: Supervision            Transfers Overall transfer level: Needs assistance Equipment used: None Transfers: Sit to/from Stand Sit to Stand:  Supervision            Ambulation/Gait Ambulation/Gait assistance: Supervision Gait Distance (Feet): 600 Feet Assistive device:  (PRN use of railing) Gait Pattern/deviations: Step-through pattern;Staggering left Gait velocity: functional Gait velocity interpretation: >2.62 ft/sec, indicative of community ambulatory General Gait Details: pt with 2 instances of loss of balance, utilizing steppiung strategy to correct   Stairs             Wheelchair Mobility    Modified Rankin (Stroke Patients Only)       Balance Overall balance assessment: Needs assistance Sitting-balance support: No upper extremity supported;Feet supported Sitting balance-Leahy Scale: Good     Standing balance support: No upper extremity supported;During functional activity Standing balance-Leahy Scale: Good                              Cognition Arousal/Alertness: Awake/alert Behavior During Therapy: WFL for tasks assessed/performed Overall Cognitive Status: Within Functional Limits for tasks assessed                                        Exercises      General Comments General comments (skin integrity, edema, etc.): VSS on RA, PT does note dressing saturated with serous drainage, covering bed pad as well      Pertinent Vitals/Pain Pain Assessment: 0-10 Pain Score: 8  Pain Location: back Pain Descriptors / Indicators: Aching Pain Intervention(s): Monitored during session    Home Living                      Prior Function  PT Goals (current goals can now be found in the care plan section) Acute Rehab PT Goals Patient Stated Goal: Hoping to return home Progress towards PT goals: Progressing toward goals    Frequency    Min 3X/week      PT Plan Current plan remains appropriate    Co-evaluation              AM-PAC PT "6 Clicks" Mobility   Outcome Measure  Help needed turning from your back to your side while in a  flat bed without using bedrails?: None Help needed moving from lying on your back to sitting on the side of a flat bed without using bedrails?: A Little Help needed moving to and from a bed to a chair (including a wheelchair)?: A Little Help needed standing up from a chair using your arms (e.g., wheelchair or bedside chair)?: A Little Help needed to walk in hospital room?: A Little Help needed climbing 3-5 steps with a railing? : A Little 6 Click Score: 19    End of Session Equipment Utilized During Treatment: Back brace Activity Tolerance: Patient tolerated treatment well Patient left: in chair;with call bell/phone within reach Nurse Communication: Mobility status PT Visit Diagnosis: Unsteadiness on feet (R26.81);Pain     Time: 1248-1300 PT Time Calculation (min) (ACUTE ONLY): 12 min  Charges:  $Gait Training: 8-22 mins                     Zenaida Niece, PT, DPT Acute Rehabilitation Pager: [phone]    Zenaida Niece 08/30/2021, 2:16 PM

## 2021-08-30 NOTE — Progress Notes (Signed)
[facility] for Infectious Disease  Date of Admission:  08/26/2021       Lines: 9/14-c rue picc  Abx: 9/15-c cefazolin  9/12-15 vanc 9/12-15 cefepime  ASSESSMENT: Surgical site infection/vertebral om lumbar Hardware complicating wound infection Rash suspect abx allergy to cefepime, less likely vanc  75 yo male hx l4-5 fusion 2019 with recurrent back pain/neurogenic claudication, s/p on 8/31 L3-4 laminotomy/facetectomy (complex surgical procedure due to prior lumbar intervody fusion), right L3-4 interbody fusion and zimmer DBM and insertion interbody prosthesis, posterior L3-5 instrumentation, however complicated most recently by early surgical site infection admitted 9/12 for I&D with retention hardware   Reviewed 9/12 operative note. Lot of pus in sq space; compromised lumbosacral fascia with pus tracking down to spine/hardware. Wound cx mssa   No sepsis this admission otherwise. No bcx obtained.   Given recent hardware placement <30 days, if this is staph aureus, would be reasonable to consider addition of rifampin  ----------- 9/16 assessment Rash with likely cefepime rather than vanc; both stopped 9/15 and changed to cefazolin for wound cx mssa Rash improved today Wife requested appointment 10/4 later --> adjusted to 1015 am  PLAN: Continue cefazolin 2 gram iv q8hrs Finish 5-7 days 20 mg pred On discharge, please also add rifampin 300 mg po bid Adjusted opat appointment time as below Will sign off    OPAT Orders Discharge antibiotics to be given via PICC line Discharge antibiotics: cefazolin Duration: 6 weeks iv cefazolin End Date: 9/12-10/24  On 10/24 will need to transition iv cefazolin to PO cefadroxil 1 gram bid for chronic suppression  Rifampin will be continued for 3 months from discharge 300 mg po bid  [facility] Care Per Protocol:  Home health RN for IV administration and teaching; PICC line care and labs.    Labs weekly while on IV  antibiotics: _x_ CBC with differential __ BMP _x_ CMP _x_ CRP __ ESR __ Vancomycin trough __ CK  _x_ Please pull PIC at completion of IV antibiotics __ Please leave PIC in place until doctor has seen patient or been notified  Fax weekly labs to [phone]  Clinic Follow Up Appt: 10/04 @ 1015 am  @  [facility] [place], [place], [place] [address] Phone: [phone]   I spent more than 35 minute reviewing data/chart, and coordinating care and >50% direct face to face time providing counseling/discussing diagnostics/treatment plan with patient   Active Problems:   Wound infection complicating hardware ([place])   Postoperative complication of skin involving drainage from surgical wound   Allergies  Allergen Reactions   Aricept [Donepezil Hcl] Diarrhea and Other (See Comments)    Diarrhea, stomach problems.    Atropine Swelling and Other (See Comments)    Eyes swollen and red   Other Other (See Comments)    Moisturizing eye drop - unknown    Scheduled Meds:  amLODipine  10 mg Oral Daily   And   irbesartan  150 mg Oral Daily   Chlorhexidine Gluconate Cloth  6 each Topical Daily   docusate sodium  100 mg Oral BID   fenofibrate  160 mg Oral Daily   FLUoxetine  20 mg Oral Daily   folic acid  1 mg Oral Daily   insulin aspart  0-20 Units Subcutaneous TID WC   insulin aspart  0-5 Units Subcutaneous QHS   levothyroxine  50 mcg Oral Q0600   memantine  28 mg Oral Daily   mirtazapine  30 mg Oral QHS   multivitamin with minerals  1 tablet Oral Daily   pantoprazole  40 mg Oral Daily   predniSONE  20 mg Oral Q breakfast   rosuvastatin  10 mg Oral Daily   sodium chloride flush  3 mL Intravenous Q12H   thiamine  100 mg Oral Daily   Continuous Infusions:  sodium chloride 75 mL/hr at 08/30/21 0700   sodium chloride      ceFAZolin (ANCEF) IV Stopped (08/30/21 0554)   PRN Meds:.acetaminophen **OR** acetaminophen, bisacodyl, cyclobenzaprine, diphenhydrAMINE,  HYDROmorphone (DILAUDID) injection, menthol-cetylpyridinium **OR** phenol, ondansetron **OR** ondansetron (ZOFRAN) IV, oxyCODONE, oxyCODONE, polyethylene glycol, sodium chloride flush, sodium chloride flush, sodium phosphate   SUBJECTIVE: Itch better Rash has begun to recede No n/v/diarrhea No worsening back pain  Some bleed at surgical site yesterday dressign changed no further bleed  Review of Systems: ROS All other ROS was negative, except mentioned above     OBJECTIVE: Vitals:   08/29/21 1019 08/29/21 1955 08/30/21 0547 08/30/21 0800  BP: 122/71 107/65 133/77 126/76  Pulse: 87 87 80 84  Resp: _0 Temp:  98.3 F (36.8 C) (!) 97.5 F (36.4 C) 98 F (36.7 C)  TempSrc:  Oral Oral Oral  SpO2: 100% 99% 100% 100%   There is no height or weight on file to calculate BMI.  Physical Exam General/constitutional: no distress, pleasant HEENT: Normocephalic, PER, Conj Clear, EOMI, Oropharynx clear Neck supple CV: rrr no mrg Lungs: clear to auscultation, normal respiratory effort Abd: Soft, Nontender Ext: no edema Skin: rash more faint today and less numerous Neuro: nonfocal MSK: lumbar surgical site no tenderness/dehiscence/erythema/fluctuance   Central line presence: rue picc site no erythema or purulence   Lab Results Lab Results  Component Value Date   WBC 8.0 08/27/2021   HGB 9.8 (L) 08/27/2021   HCT 28.0 (L) 08/27/2021   MCV 94.9 08/27/2021   PLT 229 08/27/2021    Lab Results  Component Value Date   CREATININE 1.59 (H) 08/29/2021   BUN 26 (H) 08/29/2021   NA 136 08/29/2021   K 3.2 (L) 08/29/2021   CL 109 08/29/2021   CO2 20 (L) 08/29/2021    Lab Results  Component Value Date   ALT 26 04/23/2018   AST 47 (H) 04/23/2018   ALKPHOS 48 04/23/2018   BILITOT 0.6 04/23/2018      Microbiology: Recent Results (from the past 240 hour(s))  SARS Coronavirus 2 by RT PCR (hospital order, performed in [facility] hospital lab) Nasopharyngeal  Nasopharyngeal Swab     Status: None   Collection Time: 08/26/21  4:12 PM   Specimen: Nasopharyngeal Swab  Result Value Ref Range Status   SARS Coronavirus 2 NEGATIVE NEGATIVE Final    Comment: (NOTE) SARS-CoV-2 target nucleic acids are NOT DETECTED.  The SARS-CoV-2 RNA is generally detectable in upper and lower respiratory specimens during the acute phase of infection. The lowest concentration of SARS-CoV-2 viral copies this assay can detect is 250 copies / mL. A negative result does not preclude SARS-CoV-2 infection and should not be used as the sole basis for treatment or other patient management decisions.  A negative result may occur with improper specimen collection / handling, submission of specimen other than nasopharyngeal swab, presence of viral mutation(s) within the areas targeted by this assay, and inadequate number of viral copies (<250 copies / mL). A negative result must be combined with clinical observations, patient history, and epidemiological information.  Fact Sheet for Patients:  StrictlyIdeas.no  Fact Sheet for Healthcare Providers: BankingDealers.co.za  This test is not yet approved or  cleared by the [place] FDA and has been authorized for detection and/or diagnosis of SARS-CoV-2 by FDA under an Emergency Use Authorization (EUA).  This EUA will remain in effect (meaning this test can be used) for the duration of the COVID-19 declaration under Section 564(b)(1) of the Act, 21 U.S.C. section 360bbb-3(b)(1), unless the authorization is terminated or revoked sooner.  Performed at [facility], [place] [address], [place], [place] [address]   Aerobic/Anaerobic Culture w Gram Stain (surgical/deep wound)     Status: None (Preliminary result)   Collection Time: 08/26/21  5:40 PM   Specimen: Soft Tissue, Other  Result Value Ref Range Status   Specimen Description TISSUE  Final   Special Requests LUMBAR  WOUND DRAINAGE  Final   Gram Stain   Final    FEW SQUAMOUS EPITHELIAL CELLS PRESENT FEW WBC SEEN MODERATE GRAM POSITIVE COCCI Performed at [facility], [address], [place], [place] [address]    Culture   Final    ABUNDANT STAPHYLOCOCCUS AUREUS NO ANAEROBES ISOLATED; CULTURE IN PROGRESS FOR 5 DAYS    Report Status PENDING  Incomplete   Organism ID, Bacteria STAPHYLOCOCCUS AUREUS  Final      Susceptibility   Staphylococcus aureus - MIC*    CIPROFLOXACIN <=0.5 SENSITIVE Sensitive     ERYTHROMYCIN <=0.25 SENSITIVE Sensitive     GENTAMICIN <=0.5 SENSITIVE Sensitive     OXACILLIN 0.5 SENSITIVE Sensitive     TETRACYCLINE <=1 SENSITIVE Sensitive     VANCOMYCIN <=0.5 SENSITIVE Sensitive     TRIMETH/SULFA <=10 SENSITIVE Sensitive     CLINDAMYCIN <=0.25 SENSITIVE Sensitive     RIFAMPIN <=0.5 SENSITIVE Sensitive     Inducible Clindamycin NEGATIVE Sensitive     * ABUNDANT STAPHYLOCOCCUS AUREUS  Culture, blood (routine x 2)     Status: None (Preliminary result)   Collection Time: 08/27/21  8:50 PM   Specimen: BLOOD  Result Value Ref Range Status   Specimen Description BLOOD RIGHT ANTECUBITAL  Final   Special Requests   Final    BOTTLES DRAWN AEROBIC AND ANAEROBIC Blood Culture adequate volume   Culture   Final    NO GROWTH 3 DAYS Performed at [facility], [address], [place], [place] [address]    Report Status PENDING  Incomplete  Culture, blood (routine x 2)     Status: None (Preliminary result)   Collection Time: 08/27/21  8:50 PM   Specimen: BLOOD RIGHT HAND  Result Value Ref Range Status   Specimen Description BLOOD RIGHT HAND  Final   Special Requests   Final    BOTTLES DRAWN AEROBIC AND ANAEROBIC Blood Culture adequate volume   Culture   Final    NO GROWTH 3 DAYS Performed at [facility], [place] [address] [place], [place] [address]    Report Status PENDING  Incomplete     Serology:   Imaging: If present, new imagings (plain films, ct scans,  and mri) have been personally visualized and interpreted; radiology reports have been reviewed. Decision making incorporated into the Impression / Recommendations.   Jabier Mutton, [facility] for Infectious [place] [phone] pager    08/30/2021, 11:49 AM

## 2021-08-31 LAB — GLUCOSE, CAPILLARY
Glucose-Capillary: 79 mg/dL (ref 70–99)
Glucose-Capillary: 132 mg/dL — ABNORMAL HIGH (ref 70–99)

## 2021-08-31 LAB — AEROBIC/ANAEROBIC CULTURE W GRAM STAIN (SURGICAL/DEEP WOUND)

## 2021-08-31 MED ORDER — HEPARIN SOD (PORK) LOCK FLUSH 100 UNIT/ML IV SOLN
250.0000 [IU] | INTRAVENOUS | Status: DC | PRN
  Filled 2021-08-31: qty 2.5

## 2021-08-31 MED ORDER — OXYCODONE HCL 10 MG PO TABS: 10.0000 mg | ORAL_TABLET | ORAL | 0 refills | Status: DC | PRN

## 2021-08-31 NOTE — Progress Notes (Signed)
This RN offered to do wound care/dressing change on pt's back surgery but refused and stated to do it later in the morning since pt has to go back to sleep.  Will endorse to day shift RN accordingly.

## 2021-08-31 NOTE — Discharge Summary (Addendum)
Physician Discharge Summary  Patient ID: Brandon Fleming MRN: [id number] DOB/AGE: [birth year] 74 y.o.  Admit date: 08/26/2021 Discharge date: 08/31/2021  Admission Diagnoses:  Postoperative wound infection  Discharge Diagnoses:  Same Active Problems:   Wound infection complicating hardware ([place])   Postoperative complication of skin involving drainage from surgical wound   Discharged Condition: Stable  Hospital Course:  Brandon Fleming is a 75 y.o. male who underwent lumbar fusion on 8/31, admitted from the clinic with wound dehiscence and drainage. He was taken to the operative room for wound washout. He was placed on IV abx. Wound remained stable and he was seen by ID recommending home IV abx. He was seen by PT recommending home PT. On 9/17 he was doing well with appropriate back pain, ambulating, voiding, and tolerating diet. He requested d/c.  Treatments: Surgery - lumbar wound exploration/washout  Discharge Exam: Blood pressure 140/73, pulse 84, temperature 97.6 F (36.4 C), temperature source Oral, resp. rate 18, SpO2 100 %. Awake, alert, oriented Speech fluent, appropriate CN grossly intact 5/5 BUE/BLE Wound c/d/i  Disposition: Discharge disposition: 01-Home or Self Care      Discharge Instructions     [facility] pharmacist to adjust dose for Vancomycin, Aminoglycosides and other anti-infective therapies as requested by physician.   Complete by: As directed    [facility] to provide Cath Flo 36m   Complete by: As directed    Administer for PICC line occlusion and as ordered by physician for other access device issues.   Anaphylaxis Kit: Provided to treat any anaphylactic reaction to the medication being provided to the patient if First Dose or when requested by physician   Complete by: As directed    Epinephrine 165mml vial / amp: Administer 0.58m78m0.58ml68mubcutaneously once for moderate to severe anaphylaxis, nurse to call physician and  pharmacy when reaction occurs and call 911 if needed for immediate care   Diphenhydramine 50mg58mIV vial: Administer 25-50mg 20mM PRN for first dose reaction, rash, itching, mild reaction, nurse to call physician and pharmacy when reaction occurs   Sodium Chloride 0.9% NS 500ml I15mdminister if needed for hypovolemic blood pressure drop or as ordered by physician after call to physician with anaphylactic reaction   Call MD for:  redness, tenderness, or signs of infection (pain, swelling, redness, odor or green/yellow discharge around incision site)   Complete by: As directed    Call MD for:  temperature >100.4   Complete by: As directed    Change dressing (specify)   Complete by: As directed    Dressing change: 2 times per day using gauze.   Change dressing on IV access line weekly and PRN   Complete by: As directed    Diet - low sodium heart healthy   Complete by: As directed    Discharge instructions   Complete by: As directed    Walk at home as much as possible, at least 4 times / day   Face-to-face encounter (required for Medicare/Medicaid patients)   Complete by: As directed    I NeeleshJairo Beny that this patient is under my care and that I, or a nurse practitioner or physician's assistant working with me, had a face-to-face encounter that meets the physician face-to-face encounter requirements with this patient on 08/31/2021. The encounter with the patient was in whole, or in part for the following medical condition(s) which is the primary reason for home health care (List medical condition): lumbar wound infection requiring iv  abx   The encounter with the patient was in whole, or in part, for the following medical condition, which is the primary reason for home health care: lumbar wound infection   I certify that, based on my findings, the following services are medically necessary home health services:  Nursing Physical therapy     Reason for Medically Necessary Home  Health Services: Skilled Nursing- Administration and Training of Injectable Medication   My clinical findings support the need for the above services: Pain interferes with ambulation/mobility   Further, I certify that my clinical findings support that this patient is homebound due to: Unsafe ambulation due to balance issues   Flush IV access with Sodium Chloride 0.9% and Heparin 10 units/ml or 100 units/ml   Complete by: As directed    Home Health   Complete by: As directed    To provide the following care/treatments:  PT RN     Home infusion instructions - [facility]   Complete by: As directed    Instructions: Flush IV access with Sodium Chloride 0.9% and Heparin 10units/ml or 100units/ml   Change dressing on IV access line: Weekly and PRN   Instructions Cath Flo 46m: Administer for PICC Line occlusion and as ordered by physician for other access device   [facility] pharmacist to adjust dose for: Vancomycin, Aminoglycosides and other anti-infective therapies as requested by physician   Increase activity slowly   Complete by: As directed    Lifting restrictions   Complete by: As directed    No lifting > 10 lbs   May shower / Bathe   Complete by: As directed    48 hours after surgery   May walk up steps   Complete by: As directed    Method of administration may be changed at the discretion of home infusion pharmacist based upon assessment of the patient and/or caregiver's ability to self-administer the medication ordered   Complete by: As directed    Other Restrictions   Complete by: As directed    No bending/twisting at waist      Allergies as of 08/31/2021       Reactions   Aricept [donepezil Hcl] Diarrhea, Other (See Comments)   Diarrhea, stomach problems.    Atropine Swelling, Other (See Comments)   Eyes swollen and red   Other Other (See Comments)   Moisturizing eye drop - unknown        Medication List     TAKE these medications     amLODipine-valsartan 10-160 MG tablet Commonly known as: EXFORGE Take 1 tablet by mouth daily.   ceFAZolin  IVPB Commonly known as: ANCEF Inject 2 g into the vein every 8 (eight) hours. Indication:  Surgical site/vertebral osteomyelitis First Dose: Yes Last Day of Therapy:  10/07/21 Labs - Once weekly:  CBC/D and CMP, Labs - Every other week:  ESR and CRP Method of administration: IV Push Method of administration may be changed at the discretion of home infusion pharmacist based upon assessment of the patient and/or caregiver's ability to self-administer the medication ordered.   cyclobenzaprine 10 MG tablet Commonly known as: FLEXERIL Take 1 tablet (10 mg total) by mouth 3 (three) times daily as needed for muscle spasms.   diphenhydramine-acetaminophen 25-500 MG Tabs tablet Commonly known as: TYLENOL PM Take 2 tablets by mouth at bedtime as needed (sleep).   docusate sodium 100 MG capsule Commonly known as: COLACE Take 1 capsule (100 mg total) by mouth 2 (two) times daily.  What changed:  how much to take when to take this   fenofibrate 145 MG tablet Commonly known as: TRICOR Take 145 mg by mouth daily.   FLUoxetine 20 MG capsule Commonly known as: PROZAC TAKE 1 CAPSULE BY MOUTH EVERY DAY What changed: how much to take   folic acid 1 MG tablet Commonly known as: FOLVITE Take 1 mg by mouth daily.   levothyroxine 50 MCG tablet Commonly known as: SYNTHROID Take 50 mcg by mouth daily before breakfast.   memantine 28 MG Cp24 24 hr capsule Commonly known as: NAMENDA XR Take 1 capsule (28 mg total) by mouth daily.   mirtazapine 30 MG tablet Commonly known as: REMERON TAKE 1 TABLET BY MOUTH AT BEDTIME.   multivitamin with minerals tablet Take 1 tablet by mouth daily.   ondansetron 4 MG disintegrating tablet Commonly known as: ZOFRAN-ODT Take 4 mg by mouth every 8 (eight) hours as needed for nausea or vomiting.   Oxycodone HCl 10 MG Tabs Take 1 tablet (10 mg  total) by mouth every 3 (three) hours as needed for severe pain ((score 7 to 10)). What changed: when to take this   pantoprazole 40 MG tablet Commonly known as: PROTONIX Take 40 mg by mouth daily.   rifampin 300 MG capsule Commonly known as: Rifadin Take 1 capsule (300 mg total) by mouth 2 (two) times daily.   rosuvastatin 10 MG tablet Commonly known as: CRESTOR Take 10 mg by mouth daily.   thiamine 100 MG tablet Take 100 mg by mouth daily.   traMADol 50 MG tablet Commonly known as: ULTRAM Take 50 mg by mouth every 6 (six) hours as needed for moderate pain.               Discharge Care Instructions  (From admission, onward)           Start     Ordered   08/31/21 0000  Change dressing (specify)       Comments: Dressing change: 2 times per day using gauze.   08/31/21 1059   08/30/21 0000  Change dressing on IV access line weekly and PRN  (Home infusion instructions - [facility] )        08/30/21 1419            Follow-up Information     [facility] Follow up.   Contact information: [address], [place], [place] [address] phone [place] .          Care, [facility] Follow up.   Specialty: Home Health Services Contact information: [place] [address] [phone]         Newman Pies, MD Follow up in 2 week(s).   Specialty: Neurosurgery Contact information: [address] [place] [address] [phone]                 Signed: Jairo Fleming 08/31/2021, 11:56 AM

## 2021-08-31 NOTE — TOC Progression Note (Addendum)
Transition of Care [facility]) - Progression Note    Patient Details  Name: Brandon Fleming MRN: [id number] Date of Birth: Nov 22, 1946  [facility] CM/SW Contact  Bartholomew Crews, RN Phone Number: [phone] 08/31/2021, 8:20 AM  Clinical Narrative:     Notified by Jeannene Patella with [employer] of patient's readiness to transition home today after 2 pm Ancef is administered. 10 pm dose to be given at home by wife. Wife has been taught. Cory at [facility] verified readiness to follow up with patient at home tomorrow. [facility] RN orders written 9/14. TOC following.   Expected Discharge Plan: [place]    Expected Discharge Plan and Services Expected Discharge Plan: [place]   Discharge Planning Services: CM Consult Post Acute Care Choice: [place] arrangements for the past 2 months: Single Family Home                 DME Arranged: N/A DME Agency: NA       HH Arranged: RN, IV Antibiotics HH Agency: [place] Date [facility] Agency Contacted: 08/28/21 Time [place]: 1228 Representative spoke with at [place]: Meg   Social Determinants of Health ([place]) Interventions    Readmission Risk Interventions No flowsheet data found.

## 2021-08-31 NOTE — Progress Notes (Signed)
Patient was discharged with wife as driver. Discharge instructions was given, pt and wife verbalized understanding. Wife was shown how to do dressing change to back.   Malena Catholic, RN

## 2021-08-31 NOTE — Progress Notes (Signed)
  NEUROSURGERY PROGRESS NOTE   No issues overnight. Pt with back soreness but ambulating. Sitting in bedside chair.  EXAM:  BP 140/73 (BP Location: Left Arm)   Pulse 84   Temp 97.6 F (36.4 C) (Oral)   Resp 18   SpO2 100%   Awake, alert, oriented  Speech fluent, appropriate  CN grossly intact  5/5 BUE/BLE  Wound with minimal drainage  IMPRESSION:  75 y.o. male with lumbar wound infection, appears to be responding to abx  PLAN: - can d/c home today - home ancef 2g IV q8hrs, will ad PO rifampin '300mg'$  BID per ID - home health PT and RN   Consuella Lose, MD [facility]

## 2021-09-01 LAB — CULTURE, BLOOD (ROUTINE X 2)
Culture: NO GROWTH
Culture: NO GROWTH
Special Requests: ADEQUATE
Special Requests: ADEQUATE

## 2021-09-02 DIAGNOSIS — F32A Depression, unspecified: Secondary | ICD-10-CM | POA: Diagnosis not present

## 2021-09-02 DIAGNOSIS — Z792 Long term (current) use of antibiotics: Secondary | ICD-10-CM | POA: Diagnosis not present

## 2021-09-02 DIAGNOSIS — T84296D Other mechanical complication of internal fixation device of vertebrae, subsequent encounter: Secondary | ICD-10-CM | POA: Diagnosis not present

## 2021-09-02 DIAGNOSIS — F419 Anxiety disorder, unspecified: Secondary | ICD-10-CM | POA: Diagnosis not present

## 2021-09-02 DIAGNOSIS — T8149XA Infection following a procedure, other surgical site, initial encounter: Secondary | ICD-10-CM | POA: Diagnosis not present

## 2021-09-02 DIAGNOSIS — I10 Essential (primary) hypertension: Secondary | ICD-10-CM | POA: Diagnosis not present

## 2021-09-02 DIAGNOSIS — N289 Disorder of kidney and ureter, unspecified: Secondary | ICD-10-CM | POA: Diagnosis not present

## 2021-09-02 DIAGNOSIS — Z8781 Personal history of (healed) traumatic fracture: Secondary | ICD-10-CM | POA: Diagnosis not present

## 2021-09-02 DIAGNOSIS — Z87891 Personal history of nicotine dependence: Secondary | ICD-10-CM | POA: Diagnosis not present

## 2021-09-02 DIAGNOSIS — Z452 Encounter for adjustment and management of vascular access device: Secondary | ICD-10-CM | POA: Diagnosis not present

## 2021-09-02 DIAGNOSIS — E785 Hyperlipidemia, unspecified: Secondary | ICD-10-CM | POA: Diagnosis not present

## 2021-09-02 DIAGNOSIS — Z981 Arthrodesis status: Secondary | ICD-10-CM | POA: Diagnosis not present

## 2021-09-02 DIAGNOSIS — K219 Gastro-esophageal reflux disease without esophagitis: Secondary | ICD-10-CM | POA: Diagnosis not present

## 2021-09-02 DIAGNOSIS — Z86010 Personal history of colonic polyps: Secondary | ICD-10-CM | POA: Diagnosis not present

## 2021-09-02 DIAGNOSIS — R413 Other amnesia: Secondary | ICD-10-CM | POA: Diagnosis not present

## 2021-09-04 DIAGNOSIS — F32A Depression, unspecified: Secondary | ICD-10-CM | POA: Diagnosis not present

## 2021-09-04 DIAGNOSIS — F419 Anxiety disorder, unspecified: Secondary | ICD-10-CM | POA: Diagnosis not present

## 2021-09-04 DIAGNOSIS — E089 Diabetes mellitus due to underlying condition without complications: Secondary | ICD-10-CM | POA: Diagnosis not present

## 2021-09-04 DIAGNOSIS — Z452 Encounter for adjustment and management of vascular access device: Secondary | ICD-10-CM | POA: Diagnosis not present

## 2021-09-04 DIAGNOSIS — I10 Essential (primary) hypertension: Secondary | ICD-10-CM | POA: Diagnosis not present

## 2021-09-04 DIAGNOSIS — T84296D Other mechanical complication of internal fixation device of vertebrae, subsequent encounter: Secondary | ICD-10-CM | POA: Diagnosis not present

## 2021-09-04 DIAGNOSIS — T8149XA Infection following a procedure, other surgical site, initial encounter: Secondary | ICD-10-CM | POA: Diagnosis not present

## 2021-09-06 DIAGNOSIS — M4316 Spondylolisthesis, lumbar region: Secondary | ICD-10-CM | POA: Diagnosis not present

## 2021-09-09 ENCOUNTER — Other Ambulatory Visit: Payer: Self-pay | Admitting: Neurosurgery

## 2021-09-10 DIAGNOSIS — T8149XA Infection following a procedure, other surgical site, initial encounter: Secondary | ICD-10-CM | POA: Diagnosis not present

## 2021-09-10 DIAGNOSIS — I10 Essential (primary) hypertension: Secondary | ICD-10-CM | POA: Diagnosis not present

## 2021-09-10 DIAGNOSIS — Z452 Encounter for adjustment and management of vascular access device: Secondary | ICD-10-CM | POA: Diagnosis not present

## 2021-09-10 DIAGNOSIS — T84296D Other mechanical complication of internal fixation device of vertebrae, subsequent encounter: Secondary | ICD-10-CM | POA: Diagnosis not present

## 2021-09-10 DIAGNOSIS — F32A Depression, unspecified: Secondary | ICD-10-CM | POA: Diagnosis not present

## 2021-09-10 DIAGNOSIS — F419 Anxiety disorder, unspecified: Secondary | ICD-10-CM | POA: Diagnosis not present

## 2021-09-11 ENCOUNTER — Other Ambulatory Visit: Payer: Self-pay

## 2021-09-11 ENCOUNTER — Encounter (HOSPITAL_COMMUNITY): Payer: Self-pay | Admitting: Neurosurgery

## 2021-09-11 NOTE — Progress Notes (Signed)
Patient's wife states that he has an appointment with Dr. Arnoldo Morale at 1145 tomorrow. Depending on that appointment surgery may be canceled if it is not necessary.  Instructed to come to [facility] ASAP afterwards as his arrival time should be 1141 due to needing covid test.   Patient's wife will need to accompany him to pre-op due to cognitive limitations.   Instructed not to take the following medications DOS: EXFORGE, folic acid, multivitamin, thiamine.   Instructed to bathe and not apply anything topically. Instructed to brush teeth. Instructed nothing to eat or drink after midnight.   Patient's wife voiced understanding of these instructions.

## 2021-09-12 ENCOUNTER — Ambulatory Visit (HOSPITAL_COMMUNITY): Admission: RE | Admit: 2021-09-12 | Payer: Medicare Other | Source: Ambulatory Visit | Admitting: Neurosurgery

## 2021-09-12 ENCOUNTER — Encounter (HOSPITAL_COMMUNITY): Payer: Self-pay | Admitting: Anesthesiology

## 2021-09-12 ENCOUNTER — Encounter (HOSPITAL_COMMUNITY): Admission: RE | Payer: Self-pay | Source: Ambulatory Visit

## 2021-09-12 SURGERY — LUMBAR WOUND DEBRIDEMENT
Anesthesia: General

## 2021-09-12 NOTE — Anesthesia Preprocedure Evaluation (Deleted)
Anesthesia Evaluation    Reviewed: Allergy & Precautions, Patient's Chart, lab work & pertinent test results  History of Anesthesia Complications Negative for: history of anesthetic complications  Airway        Dental   Pulmonary former smoker,           Cardiovascular hypertension, Pt. on medications      Neuro/Psych negative psych ROS   GI/Hepatic GERD  Medicated and Controlled,(+)     substance abuse  alcohol use,   Endo/Other  Hypothyroidism   Renal/GU Renal InsufficiencyRenal disease  negative genitourinary   Musculoskeletal  (+) Arthritis ,   Abdominal   Peds  Hematology negative hematology ROS (+)   Anesthesia Other Findings Day of surgery medications reviewed with patient.  Reproductive/Obstetrics negative OB ROS                             Anesthesia Physical Anesthesia Plan  ASA: 3  Anesthesia Plan: General   Post-op Pain Management:    Induction: Intravenous  PONV Risk Score and Plan: 3 and Treatment may vary due to age or medical condition, Dexamethasone, Ondansetron and Midazolam  Airway Management Planned: Oral ETT  Additional Equipment: None  Intra-op Plan:   Post-operative Plan: Extubation in OR  Informed Consent:   Plan Discussed with:   Anesthesia Plan Comments:         Anesthesia Quick Evaluation

## 2021-09-17 ENCOUNTER — Telehealth: Payer: Self-pay

## 2021-09-17 ENCOUNTER — Encounter: Payer: Self-pay | Admitting: Internal Medicine

## 2021-09-17 ENCOUNTER — Inpatient Hospital Stay: Payer: Medicare Other | Admitting: Internal Medicine

## 2021-09-17 ENCOUNTER — Ambulatory Visit (INDEPENDENT_AMBULATORY_CARE_PROVIDER_SITE_OTHER): Payer: Medicare Other | Admitting: Internal Medicine

## 2021-09-17 ENCOUNTER — Other Ambulatory Visit: Payer: Self-pay

## 2021-09-17 VITALS — BP 145/89 | HR 95 | Temp 98.1°F | Wt 152.0 lb

## 2021-09-17 DIAGNOSIS — F32A Depression, unspecified: Secondary | ICD-10-CM | POA: Diagnosis not present

## 2021-09-17 DIAGNOSIS — T847XXD Infection and inflammatory reaction due to other internal orthopedic prosthetic devices, implants and grafts, subsequent encounter: Secondary | ICD-10-CM

## 2021-09-17 DIAGNOSIS — T84296D Other mechanical complication of internal fixation device of vertebrae, subsequent encounter: Secondary | ICD-10-CM | POA: Diagnosis not present

## 2021-09-17 DIAGNOSIS — M4626 Osteomyelitis of vertebra, lumbar region: Secondary | ICD-10-CM | POA: Diagnosis not present

## 2021-09-17 DIAGNOSIS — B9689 Other specified bacterial agents as the cause of diseases classified elsewhere: Secondary | ICD-10-CM

## 2021-09-17 DIAGNOSIS — F419 Anxiety disorder, unspecified: Secondary | ICD-10-CM | POA: Diagnosis not present

## 2021-09-17 DIAGNOSIS — I10 Essential (primary) hypertension: Secondary | ICD-10-CM | POA: Diagnosis not present

## 2021-09-17 DIAGNOSIS — Z452 Encounter for adjustment and management of vascular access device: Secondary | ICD-10-CM | POA: Diagnosis not present

## 2021-09-17 DIAGNOSIS — T8149XA Infection following a procedure, other surgical site, initial encounter: Secondary | ICD-10-CM | POA: Diagnosis not present

## 2021-09-17 DIAGNOSIS — G061 Intraspinal abscess and granuloma: Secondary | ICD-10-CM

## 2021-09-17 NOTE — Patient Instructions (Signed)
See your neurosurgeon   Stop rifampin Suspect appetite/weakness due to rifampin  Will need to reevaluate before considering transitioning to oral antibiotics from the cefazolin  In the mean time will extend the cefazolin by 2 weeks

## 2021-09-17 NOTE — Progress Notes (Signed)
[facility] for [facility] Disease  Patient Active Problem List   Diagnosis Date Noted   Wound infection complicating hardware ([place]) 08/26/2021   Postoperative complication of skin involving drainage from surgical wound 08/26/2021   Lumbar adjacent segment disease with spondylolisthesis 08/14/2021   Insomnia 04/19/2020   Dislocated intraocular lens 05/18/2018   Dislocated intraocular lens, sequela 05/05/2018   Spondylolisthesis of lumbar region 05/05/2018   Memory difficulties 04/08/2018   Gait abnormality 04/08/2018   Alcohol withdrawal ([place]) 02/28/2018   Weakness 02/26/2018   Essential hypertension 02/26/2018   HLD (hyperlipidemia) 02/26/2018   Tremors of nervous system 02/26/2018   Alcohol abuse 02/26/2018   Weakness generalized 02/26/2018      Subjective:    Patient ID: Brandon Fleming, male    DOB: 04-May-1946, 75 y.o.   MRN: [id number]  Chief Complaint  Patient presents with   Hospitalization Follow-up    Pt reports feeling fatigue, "deteriorated", no appetite and BP/Pulse rate being elevated.     HPI:  Brandon Fleming is a 75 y.o. male hx l4-5 fusion 2019 with recurrent back pain/neurogenic claudication, s/p on 8/31 L3-4 laminotomy/facetectomy (complex surgical procedure due to prior lumbar intervody fusion), right L3-4 interbody fusion and zimmer DBM and insertion interbody prosthesis, posterior L3-5 instrumentation, however complicated most recently by early surgical site infection admitted 9/12 for I&D with retention hardware   I saw paitent during admission 08/26/21.  Reviewed 9/12 operative note. Lot of pus in sq space; compromised lumbosacral fascia with pus tracking down to spine/hardware. Wound cx mssa. Hardware left in place. No blood cx obtained He had a rash while on cefepime/vanc, but tolerated cefazolin Discharged on rifampin and cefazolin Plan 6 weeks cefazolin until 10/24 then to transition to cefadroxil for chronic suppression along  with 3 months rifampin   ------ 10/04 id clinic f/u Patient had 2 weeks burning 4 lesions on his forehead; was skin color then turned erythematous. No vessicles. Also decreased appetite, generalized weakness since discharge, worse Pain same in the back; severe. Difficulty getting around. He gets around though with FWW No LE neurological deficit or urinary bowel incontinence No n/v/diarrhea No joint pain muscle ache otherwise Feels cold all the time No fever No sorethroat/cough No sick contact  [facility] labs reviewed Crp up the past week Lft up  Hct down   Allergies  Allergen Reactions   Aricept [Donepezil Hcl] Diarrhea and Other (See Comments)    Diarrhea, stomach problems.    Atropine Swelling and Other (See Comments)    Eyes swollen and red   Other Other (See Comments)    Moisturizing eye drop - unknown      Outpatient Medications Prior to Visit  Medication Sig Dispense Refill   amLODipine-valsartan (EXFORGE) 10-160 MG tablet Take 1 tablet by mouth daily.     ceFAZolin (ANCEF) IVPB Inject 2 g into the vein every 8 (eight) hours. Indication:  Surgical site/vertebral osteomyelitis First Dose: Yes Last Day of Therapy:  10/07/21 Labs - Once weekly:  CBC/D and CMP, Labs - Every other week:  ESR and CRP Method of administration: IV Push Method of administration may be changed at the discretion of home infusion pharmacist based upon assessment of the patient and/or caregiver's ability to self-administer the medication ordered. 117 Units 0   cyclobenzaprine (FLEXERIL) 10 MG tablet Take 1 tablet (10 mg total) by mouth 3 (three) times daily as needed for muscle spasms. 30 tablet 0   diphenhydramine-acetaminophen (TYLENOL  PM) 25-500 MG TABS tablet Take 2 tablets by mouth at bedtime as needed (sleep).     docusate sodium (COLACE) 100 MG capsule Take 1 capsule (100 mg total) by mouth 2 (two) times daily. (Patient taking differently: Take 200 mg by mouth daily.) 10 capsule 0    fenofibrate (TRICOR) 145 MG tablet Take 145 mg by mouth daily.     FLUoxetine (PROZAC) 20 MG capsule TAKE 1 CAPSULE BY MOUTH EVERY DAY (Patient taking differently: Take 20 mg by mouth daily.) 90 capsule 3   folic acid (FOLVITE) 1 MG tablet Take 1 mg by mouth daily.     gabapentin (NEURONTIN) 300 MG capsule Take by mouth.     levothyroxine (SYNTHROID) 50 MCG tablet Take 50 mcg by mouth daily before breakfast.     memantine (NAMENDA XR) 28 MG CP24 24 hr capsule Take 1 capsule (28 mg total) by mouth daily. 90 capsule 3   mirtazapine (REMERON) 30 MG tablet TAKE 1 TABLET BY MOUTH AT BEDTIME. (Patient taking differently: Take 30 mg by mouth at bedtime.) 90 tablet 3   Multiple Vitamins-Minerals (MULTIVITAMIN WITH MINERALS) tablet Take 1 tablet by mouth daily.     ondansetron (ZOFRAN-ODT) 4 MG disintegrating tablet Take 4 mg by mouth every 8 (eight) hours as needed for nausea or vomiting.     oxyCODONE 10 MG TABS Take 1 tablet (10 mg total) by mouth every 3 (three) hours as needed for severe pain ((score 7 to 10)). 30 tablet 0   pantoprazole (PROTONIX) 40 MG tablet Take 40 mg by mouth daily.     rifampin (RIFADIN) 300 MG capsule Take 1 capsule (300 mg total) by mouth 2 (two) times daily. 60 capsule 2   rosuvastatin (CRESTOR) 10 MG tablet Take 10 mg by mouth daily.      thiamine 100 MG tablet Take 100 mg by mouth daily.     traMADol (ULTRAM) 50 MG tablet Take 50 mg by mouth every 6 (six) hours as needed for moderate pain.     XTAMPZA ER 18 MG C12A Take 1 capsule by mouth every 12 (twelve) hours.     gabapentin (NEURONTIN) 300 MG capsule Take 300 mg by mouth 3 (three) times daily.     No facility-administered medications prior to visit.     Social History   Socioeconomic History   Marital status: Married    Spouse name: Not on file   Number of children: 1   Years of education: 16   Highest education level: Master's degree (e.g., MA, MS, MEng, MEd, MSW, MBA)  Occupational History   Occupation:  Retired  Tobacco Use   Smoking status: Former    Types: Cigarettes    Quit date: 1978    Years since quitting: 44.7   Smokeless tobacco: Never  Vaping Use   Vaping Use: Never used  Substance and Sexual Activity   Alcohol use: Yes    Comment: 8-10 ounces daily   Drug use: No   Sexual activity: Not on file  Other Topics Concern   Not on file  Social History Narrative   Lives at home with his wife.   Right-handed.   Caffeine- minimal use (4 ounces tea/soda per day)   Social Determinants of Health   Financial Resource Strain: Not on file  Food Insecurity: Not on file  Transportation Needs: Not on file  Physical Activity: Not on file  Stress: Not on file  Social Connections: Not on file  Intimate Partner Violence: Not on  file      Review of Systems    All other ros negative  Objective:    BP (!) 145/89   Pulse 95   Temp 98.1 F (36.7 C) (Oral)   Wt 152 lb (68.9 kg)   SpO2 100%   BMI 24.53 kg/m  Nursing note and vital signs reviewed.  Physical Exam     General/constitutional: no distress, pleasant HEENT: Normocephalic, PER, Conj Clear, EOMI, Oropharynx clear Neck supple CV: rrr no mrg Lungs: clear to auscultation, normal respiratory effort Abd: Soft, Nontender Ext: no edema Skin: four 2-3 mm erythamtous shallow ulcer right forehead next to midline; no vessicle Neuro: nonfocal MSK: no peripheral joint swelling/tenderness/warmth; back spines nontender - incision intact with sutures, there is mild crusting at the most inferior end of the incision. No fluctuance/erythema     Central line presence: rue picc site no erythema/tenderness  Labs: Lab Results  Component Value Date   WBC 8.0 08/27/2021   HGB 9.8 (L) 08/27/2021   HCT 28.0 (L) 08/27/2021   MCV 94.9 08/27/2021   PLT 229 64/40/3474   Last metabolic panel Lab Results  Component Value Date   GLUCOSE 117 (H) 08/29/2021   NA 136 08/29/2021   K 3.2 (L) 08/29/2021   CL 109 08/29/2021   CO2 20  (L) 08/29/2021   BUN 26 (H) 08/29/2021   CREATININE 1.59 (H) 08/29/2021   GFRNONAA 45 (L) 08/29/2021   CALCIUM 8.3 (L) 08/29/2021   PROT 7.0 04/23/2018   ALBUMIN 4.1 04/23/2018   BILITOT 0.6 04/23/2018   ALKPHOS 48 04/23/2018   AST 47 (H) 04/23/2018   ALT 26 04/23/2018   ANIONGAP 7 08/29/2021    [facility]: 9/27 cbc 6/8/225 normal diff; cr 1.04 9/19 cbc 9/9/332; cr 1.02; lft 88/17/151/0.7  Crp: 9/29    106 (<10) 9/19      52 (<10)  Micro:  Serology:  Imaging:  Assessment & Plan:   Problem List Items Addressed This Visit       Other   Wound infection complicating hardware ([place])   Other Visit Diagnoses     Acute osteomyelitis of lumbar spine ([place])    -  Primary   Bacterial spinal epidural abscess             No orders of the defined types were placed in this encounter.    Lines: 9/14-c rue picc   Abx: 9/15-c cefazolin 9/17-c rifampin 300 mg po bid   9/12-15 vanc 9/12-15 cefepime   ASSESSMENT: Surgical site infection/vertebral om lumbar Hardware complicating wound infection Rash suspect abx allergy to cefepime, less likely vanc   75 yo male hx l4-5 fusion 2019 with recurrent back pain/neurogenic claudication, s/p on 8/31 L3-4 laminotomy/facetectomy (complex surgical procedure due to prior lumbar intervody fusion), right L3-4 interbody fusion and zimmer DBM and insertion interbody prosthesis, posterior L3-5 instrumentation, however complicated most recently by early surgical site infection admitted 9/12 for I&D with retention hardware. Patient here for id clinic f/u   Reviewed 9/12 operative note. Hardware retained. Lot of pus in sq space; compromised lumbosacral fascia with pus tracking down to spine/hardware. Wound cx mssa   No sepsis this admission otherwise. No bcx obtained.   Given recent hardware placement <30 days, discharged on combination abx with rifampin  Developed rash on vanc/cefepime. Did ok on cefazolin though  ------- 10/4 id clinic  f/u Fatigue/generalized weakness/poor apptite likely abx combination and ongoing pain/pain meds (taking extended oxycodone currently). Hct down and lft up considered rifampin  side effect Also has a forehead lesion that is suggestive of shingle. ?cause for crp elevation Pain in lower back severe but stable without neurologic deficit. Incision intact. Patient to discuss pain management with nsg today  At this time, will stop rifampin and continue following labs Will see back in around 3 weeks for follow pu  -continue cefazolin -- end date is 10/24, but will extend 2 weeks given crp elevation -transition to oral cefadroxil to be determined next visit -stop rifampin -cbc/cmp/crp with each [place] lab draw -see me again in around 3 weeks -follow up with nsg today   Follow-up: Return in about 3 weeks (around 10/08/2021).      Jabier Mutton, [facility] [phone]  pager   [phone] cell 09/17/2021, 10:26 AM

## 2021-09-17 NOTE — Telephone Encounter (Signed)
Informed Jeani Hawking, RN at [facility] of verbal orders per Dr.Vu - Extend IV Cefazolin until 10/22/2021 and will need weekly CBC, CMP, CRP. Jeani Hawking, RN repeated verbal orders back and verbalized his understanding.   [place], CMA

## 2021-09-24 DIAGNOSIS — F32A Depression, unspecified: Secondary | ICD-10-CM | POA: Diagnosis not present

## 2021-09-24 DIAGNOSIS — F419 Anxiety disorder, unspecified: Secondary | ICD-10-CM | POA: Diagnosis not present

## 2021-09-24 DIAGNOSIS — T8149XA Infection following a procedure, other surgical site, initial encounter: Secondary | ICD-10-CM | POA: Diagnosis not present

## 2021-09-24 DIAGNOSIS — T84296D Other mechanical complication of internal fixation device of vertebrae, subsequent encounter: Secondary | ICD-10-CM | POA: Diagnosis not present

## 2021-09-24 DIAGNOSIS — I10 Essential (primary) hypertension: Secondary | ICD-10-CM | POA: Diagnosis not present

## 2021-09-24 DIAGNOSIS — Z452 Encounter for adjustment and management of vascular access device: Secondary | ICD-10-CM | POA: Diagnosis not present

## 2021-09-25 ENCOUNTER — Ambulatory Visit: Payer: Medicare PPO | Attending: Cardiovascular Disease

## 2021-09-25 DIAGNOSIS — Z45018 Encounter for adjustment and management of other part of cardiac pacemaker: Secondary | ICD-10-CM | POA: Insufficient documentation

## 2021-09-25 DIAGNOSIS — I442 Atrioventricular block, complete: Secondary | ICD-10-CM | POA: Insufficient documentation

## 2021-09-25 DIAGNOSIS — Z950 Presence of cardiac pacemaker: Secondary | ICD-10-CM | POA: Insufficient documentation

## 2021-09-26 ENCOUNTER — Other Ambulatory Visit (HOSPITAL_BASED_OUTPATIENT_CLINIC_OR_DEPARTMENT_OTHER): Payer: Self-pay

## 2021-09-26 ENCOUNTER — Telehealth (INDEPENDENT_AMBULATORY_CARE_PROVIDER_SITE_OTHER): Payer: Self-pay | Admitting: Student in an Organized Health Care Education/Training Program

## 2021-09-26 MED ORDER — INFLUENZA VAC A&B SA ADJ QUAD 0.5 ML IM PRSY
PREFILLED_SYRINGE | INTRAMUSCULAR | 0 refills | Status: DC
  Filled 2021-09-26: qty 0.5, 1d supply, fill #0

## 2021-09-26 NOTE — Telephone Encounter (Signed)
Review of recent remote transmission and Epic show patient is due to establish care with EP and have a device check. PSRs please schedule Stephen Eaton with Dr. Loma Newton for next available. Not urgent.

## 2021-10-01 DIAGNOSIS — F419 Anxiety disorder, unspecified: Secondary | ICD-10-CM | POA: Diagnosis not present

## 2021-10-01 DIAGNOSIS — T84296D Other mechanical complication of internal fixation device of vertebrae, subsequent encounter: Secondary | ICD-10-CM | POA: Diagnosis not present

## 2021-10-01 DIAGNOSIS — Z452 Encounter for adjustment and management of vascular access device: Secondary | ICD-10-CM | POA: Diagnosis not present

## 2021-10-01 DIAGNOSIS — I10 Essential (primary) hypertension: Secondary | ICD-10-CM | POA: Diagnosis not present

## 2021-10-01 DIAGNOSIS — T8149XA Infection following a procedure, other surgical site, initial encounter: Secondary | ICD-10-CM | POA: Diagnosis not present

## 2021-10-01 DIAGNOSIS — F32A Depression, unspecified: Secondary | ICD-10-CM | POA: Diagnosis not present

## 2021-10-01 NOTE — Telephone Encounter (Signed)
1st attempt - Spoke to the patient and offered to schedule appointment with EP and device. Patient states he established care with Dr. Cherly Hensen at the [facility]. The PSS asked if Dr. Cherly Hensen is monitoring his device as well but the patient wasn't sure. He said he would call Dr. Nelta Numbers office to find out and let us know. Patient declined to schedule. Forwarding to device team as FYI.

## 2021-10-02 DIAGNOSIS — Z981 Arthrodesis status: Secondary | ICD-10-CM | POA: Diagnosis not present

## 2021-10-02 DIAGNOSIS — K219 Gastro-esophageal reflux disease without esophagitis: Secondary | ICD-10-CM | POA: Diagnosis not present

## 2021-10-02 DIAGNOSIS — N289 Disorder of kidney and ureter, unspecified: Secondary | ICD-10-CM | POA: Diagnosis not present

## 2021-10-02 DIAGNOSIS — E785 Hyperlipidemia, unspecified: Secondary | ICD-10-CM | POA: Diagnosis not present

## 2021-10-02 DIAGNOSIS — Z86010 Personal history of colonic polyps: Secondary | ICD-10-CM | POA: Diagnosis not present

## 2021-10-02 DIAGNOSIS — T8149XA Infection following a procedure, other surgical site, initial encounter: Secondary | ICD-10-CM | POA: Diagnosis not present

## 2021-10-02 DIAGNOSIS — I10 Essential (primary) hypertension: Secondary | ICD-10-CM | POA: Diagnosis not present

## 2021-10-02 DIAGNOSIS — Z792 Long term (current) use of antibiotics: Secondary | ICD-10-CM | POA: Diagnosis not present

## 2021-10-02 DIAGNOSIS — Z8781 Personal history of (healed) traumatic fracture: Secondary | ICD-10-CM | POA: Diagnosis not present

## 2021-10-02 DIAGNOSIS — F32A Depression, unspecified: Secondary | ICD-10-CM | POA: Diagnosis not present

## 2021-10-02 DIAGNOSIS — F419 Anxiety disorder, unspecified: Secondary | ICD-10-CM | POA: Diagnosis not present

## 2021-10-02 DIAGNOSIS — T84296D Other mechanical complication of internal fixation device of vertebrae, subsequent encounter: Secondary | ICD-10-CM | POA: Diagnosis not present

## 2021-10-02 DIAGNOSIS — Z452 Encounter for adjustment and management of vascular access device: Secondary | ICD-10-CM | POA: Diagnosis not present

## 2021-10-02 DIAGNOSIS — R413 Other amnesia: Secondary | ICD-10-CM | POA: Diagnosis not present

## 2021-10-02 DIAGNOSIS — Z87891 Personal history of nicotine dependence: Secondary | ICD-10-CM | POA: Diagnosis not present

## 2021-10-08 DIAGNOSIS — F32A Depression, unspecified: Secondary | ICD-10-CM | POA: Diagnosis not present

## 2021-10-08 DIAGNOSIS — I10 Essential (primary) hypertension: Secondary | ICD-10-CM | POA: Diagnosis not present

## 2021-10-08 DIAGNOSIS — T8149XA Infection following a procedure, other surgical site, initial encounter: Secondary | ICD-10-CM | POA: Diagnosis not present

## 2021-10-08 DIAGNOSIS — Z452 Encounter for adjustment and management of vascular access device: Secondary | ICD-10-CM | POA: Diagnosis not present

## 2021-10-08 DIAGNOSIS — F419 Anxiety disorder, unspecified: Secondary | ICD-10-CM | POA: Diagnosis not present

## 2021-10-08 DIAGNOSIS — T84296D Other mechanical complication of internal fixation device of vertebrae, subsequent encounter: Secondary | ICD-10-CM | POA: Diagnosis not present

## 2021-10-09 ENCOUNTER — Ambulatory Visit (INDEPENDENT_AMBULATORY_CARE_PROVIDER_SITE_OTHER): Payer: Medicare Other | Admitting: Internal Medicine

## 2021-10-09 ENCOUNTER — Ambulatory Visit: Payer: Medicare Other | Attending: Internal Medicine

## 2021-10-09 ENCOUNTER — Encounter: Payer: Self-pay | Admitting: Internal Medicine

## 2021-10-09 ENCOUNTER — Other Ambulatory Visit: Payer: Self-pay

## 2021-10-09 ENCOUNTER — Other Ambulatory Visit (HOSPITAL_BASED_OUTPATIENT_CLINIC_OR_DEPARTMENT_OTHER): Payer: Self-pay

## 2021-10-09 VITALS — BP 124/79 | HR 96 | Temp 98.3°F | Wt 156.0 lb

## 2021-10-09 DIAGNOSIS — M4620 Osteomyelitis of vertebra, site unspecified: Secondary | ICD-10-CM | POA: Diagnosis not present

## 2021-10-09 DIAGNOSIS — Z23 Encounter for immunization: Secondary | ICD-10-CM

## 2021-10-09 MED ORDER — CEFADROXIL 1 G PO TABS: 1.0000 g | ORAL_TABLET | Freq: Two times a day (BID) | ORAL | 11 refills | Status: DC

## 2021-10-09 MED ORDER — PFIZER COVID-19 VAC BIVALENT 30 MCG/0.3ML IM SUSP
INTRAMUSCULAR | 0 refills | Status: DC
  Filled 2021-10-09: qty 0.3, 1d supply, fill #0

## 2021-10-09 NOTE — Progress Notes (Signed)
Per verbal order from Dr. Gale Journey, 38 cm PICC removed from right basilic, tip intact. No sutures present. RN confirmed length per chart. Dressing was clean and dry. Insertion site cleaned with CHG. Petroleum dressing applied. Patient advised no heavy lifting with this arm and to leave dressing in place for 24 hours and not to shower affected arm for 24 hours. Advised patient to call office or seek emergency medical care if dressing becomes soaked with blood, swelling, or sharp pain presents. Advised patient to seek emergent care if develops, chest pain, or shortness of breath. Instructed patient to notify office if they notice redness, warmth, or drainage at the site. Patient verbalized understanding and agreement. RN answered patient's questions. Patient tolerated procedure well and RN walked patient to check out. RN notified Kayla at [facility] of removal.    Beryle Flock, RN

## 2021-10-09 NOTE — Patient Instructions (Signed)
Stop iv antibiotics, remove picc today  Start oral antibiotics cefadroxil, indefinitely   **you have metals in place and infection with the worst bacteria which have high risk of failure. Will go as long as the antibiotics allow and hopefully it remains suppressed   See me in 4 weeks   Please see your doctor to look at fatigue, leg swelling and make sure your heart is ok and you don't have heart failure

## 2021-10-09 NOTE — Progress Notes (Signed)
[facility] for Infectious Disease  Patient Active Problem List   Diagnosis Date Noted   Wound infection complicating hardware ([place]) 08/26/2021   Postoperative complication of skin involving drainage from surgical wound 08/26/2021   Lumbar adjacent segment disease with spondylolisthesis 08/14/2021   Insomnia 04/19/2020   Dislocated intraocular lens 05/18/2018   Dislocated intraocular lens, sequela 05/05/2018   Spondylolisthesis of lumbar region 05/05/2018   Memory difficulties 04/08/2018   Gait abnormality 04/08/2018   Alcohol withdrawal ([place]) 02/28/2018   Weakness 02/26/2018   Essential hypertension 02/26/2018   HLD (hyperlipidemia) 02/26/2018   Tremors of nervous system 02/26/2018   Alcohol abuse 02/26/2018   Weakness generalized 02/26/2018      Subjective:    Patient ID: Brandon Fleming, male    DOB: 1946-10-08, 75 y.o.   MRN: [id number]  Chief Complaint  Patient presents with   Follow-up    Acute osteomyelitis of lumbar spine     HPI:  Brandon Fleming is a 75 y.o. male hx l4-5 fusion 2019 with recurrent back pain/neurogenic claudication, s/p on 8/31 L3-4 laminotomy/facetectomy (complex surgical procedure due to prior lumbar intervody fusion), right L3-4 interbody fusion and zimmer DBM and insertion interbody prosthesis, posterior L3-5 instrumentation, however complicated most recently by early surgical site infection admitted 9/12 for I&D with retention hardware   I saw paitent during admission 08/26/21.  Reviewed 9/12 operative note. Lot of pus in sq space; compromised lumbosacral fascia with pus tracking down to spine/hardware. Wound cx mssa. Hardware left in place. No blood cx obtained He had a rash while on cefepime/vanc, but tolerated cefazolin Discharged on rifampin and cefazolin Plan 6 weeks cefazolin until 10/24 then to transition to cefadroxil for chronic suppression along with 3 months rifampin   ------ 10/04 id clinic f/u Patient had 2  weeks burning 4 lesions on his forehead; was skin color then turned erythematous. No vessicles. Also decreased appetite, generalized weakness since discharge, worse Pain same in the back; severe. Difficulty getting around. He gets around though with FWW No LE neurological deficit or urinary bowel incontinence No n/v/diarrhea No joint pain muscle ache otherwise Feels cold all the time No fever No sorethroat/cough No sick contact  [facility] labs reviewed Crp up the past week Lft up  Hct down  10/26 id f/u Shingle on forehead had resolved Still with poor appetite, malaise New legs edema -- concerned if cefazolin can do that There is occasional serosanguinous stain at incision. Pain stable mild-moderate. Patient ambulatory No fever, chill No diarrhea, n/v  Labs not available   Allergies  Allergen Reactions   Aricept [Donepezil Hcl] Diarrhea and Other (See Comments)    Diarrhea, stomach problems.    Atropine Swelling and Other (See Comments)    Eyes swollen and red   Other Other (See Comments)    Moisturizing eye drop - unknown      Outpatient Medications Prior to Visit  Medication Sig Dispense Refill   amLODipine-valsartan (EXFORGE) 10-160 MG tablet Take 1 tablet by mouth daily.     ceFAZolin (ANCEF) 10 g injection      cyclobenzaprine (FLEXERIL) 10 MG tablet Take 1 tablet (10 mg total) by mouth 3 (three) times daily as needed for muscle spasms. 30 tablet 0   diphenhydramine-acetaminophen (TYLENOL PM) 25-500 MG TABS tablet Take 2 tablets by mouth at bedtime as needed (sleep).     docusate sodium (COLACE) 100 MG capsule Take 1 capsule (100 mg total) by mouth  2 (two) times daily. (Patient taking differently: Take 200 mg by mouth daily.) 10 capsule 0   fenofibrate (TRICOR) 145 MG tablet Take 145 mg by mouth daily.     FLUoxetine (PROZAC) 20 MG capsule TAKE 1 CAPSULE BY MOUTH EVERY DAY (Patient taking differently: Take 20 mg by mouth daily.) 90 capsule 3   folic acid  (FOLVITE) 1 MG tablet Take 1 mg by mouth daily.     gabapentin (NEURONTIN) 300 MG capsule Take by mouth.     influenza vaccine adjuvanted (FLUAD) 0.5 ML injection Inject into the muscle. 0.5 mL 0   levothyroxine (SYNTHROID) 50 MCG tablet Take 50 mcg by mouth daily before breakfast.     memantine (NAMENDA XR) 28 MG CP24 24 hr capsule Take 1 capsule (28 mg total) by mouth daily. 90 capsule 3   mirtazapine (REMERON) 30 MG tablet TAKE 1 TABLET BY MOUTH AT BEDTIME. (Patient taking differently: Take 30 mg by mouth at bedtime.) 90 tablet 3   Multiple Vitamins-Minerals (MULTIVITAMIN WITH MINERALS) tablet Take 1 tablet by mouth daily.     ondansetron (ZOFRAN-ODT) 4 MG disintegrating tablet Take 4 mg by mouth every 8 (eight) hours as needed for nausea or vomiting.     oxyCODONE 10 MG TABS Take 1 tablet (10 mg total) by mouth every 3 (three) hours as needed for severe pain ((score 7 to 10)). 30 tablet 0   pantoprazole (PROTONIX) 40 MG tablet Take 40 mg by mouth daily.     rosuvastatin (CRESTOR) 10 MG tablet Take 10 mg by mouth daily.      thiamine 100 MG tablet Take 100 mg by mouth daily.     traMADol (ULTRAM) 50 MG tablet Take 50 mg by mouth every 6 (six) hours as needed for moderate pain.     XTAMPZA ER 18 MG C12A Take 1 capsule by mouth every 12 (twelve) hours.     memantine (NAMENDA XR) 28 MG CP24 24 hr capsule Take 1 capsule by mouth daily.     pantoprazole (PROTONIX) 40 MG tablet Take 40 mg by mouth daily.     rifampin (RIFADIN) 300 MG capsule Take 1 capsule (300 mg total) by mouth 2 (two) times daily. (Patient not taking: Reported on 10/09/2021) 60 capsule 2   No facility-administered medications prior to visit.     Social History   Socioeconomic History   Marital status: Married    Spouse name: Not on file   Number of children: 1   Years of education: 16   Highest education level: Master's degree (e.g., MA, MS, MEng, MEd, MSW, MBA)  Occupational History   Occupation: Retired  Tobacco Use    Smoking status: Former    Types: Cigarettes    Quit date: 1978    Years since quitting: 44.8   Smokeless tobacco: Never  Vaping Use   Vaping Use: Never used  Substance and Sexual Activity   Alcohol use: Yes    Comment: 8-10 ounces daily   Drug use: No   Sexual activity: Not on file  Other Topics Concern   Not on file  Social History Narrative   Lives at home with his wife.   Right-handed.   Caffeine- minimal use (4 ounces tea/soda per day)   Social Determinants of Health   Financial Resource Strain: Not on file  Food Insecurity: Not on file  Transportation Needs: Not on file  Physical Activity: Not on file  Stress: Not on file  Social Connections: Not on file  Intimate Partner Violence: Not on file      Review of Systems    All other ros negative  Objective:    BP 124/79   Pulse 96   Temp 98.3 F (36.8 C) (Oral)   Wt 156 lb (70.8 kg)   BMI 25.18 kg/m  Nursing note and vital signs reviewed.  Physical Exam     General/constitutional: no distress, pleasant HEENT: Normocephalic, PER, Conj Clear, EOMI, Oropharynx clear Neck supple CV: rrr no mrg Lungs: clear to auscultation, normal respiratory effort Abd: Soft, Nontender Ext: no edema Skin: No Rash Neuro: nonfocal MSK: incision in the back no dehiscence, but slight erythema   Central line presence: rue picc site no erythema/purulence    Labs: Lab Results  Component Value Date   WBC 8.0 08/27/2021   HGB 9.8 (L) 08/27/2021   HCT 28.0 (L) 08/27/2021   MCV 94.9 08/27/2021   PLT 229 37/62/8315   Last metabolic panel Lab Results  Component Value Date   GLUCOSE 117 (H) 08/29/2021   NA 136 08/29/2021   K 3.2 (L) 08/29/2021   CL 109 08/29/2021   CO2 20 (L) 08/29/2021   BUN 26 (H) 08/29/2021   CREATININE 1.59 (H) 08/29/2021   GFRNONAA 45 (L) 08/29/2021   CALCIUM 8.3 (L) 08/29/2021   PROT 7.0 04/23/2018   ALBUMIN 4.1 04/23/2018   BILITOT 0.6 04/23/2018   ALKPHOS 48 04/23/2018   AST 47 (H)  04/23/2018   ALT 26 04/23/2018   ANIONGAP 7 08/29/2021    [facility]: 10/25 cbc 4/8.2/177; cr 0.98 10/18 cbc 6/8.6/251; cr 1.02 9/27 cbc 6/8/225 normal diff; cr 1.04 9/19 cbc 9/9/332; cr 1.02; lft 88/17/151/0.7  Crp: 10/25    16 (<10) 10/18    13 (<10) 9/29    106 (<10) 9/19      52 (<10)  Micro:  Serology:  Imaging:  Assessment & Plan:   Problem List Items Addressed This Visit   None Visit Diagnoses     Vertebral osteomyelitis ([place])    -  Primary   Relevant Medications   ceFAZolin (ANCEF) 10 g injection        No orders of the defined types were placed in this encounter.    Lines: 9/14-c rue picc   Abx: 9/15-c cefazolin 9/17-c rifampin 300 mg po bid   9/12-15 vanc 9/12-15 cefepime   ASSESSMENT: Surgical site infection/vertebral om lumbar Hardware complicating wound infection Rash suspect abx allergy to cefepime, less likely vanc   75 yo male hx l4-5 fusion 2019 with recurrent back pain/neurogenic claudication, s/p on 8/31 L3-4 laminotomy/facetectomy (complex surgical procedure due to prior lumbar intervody fusion), right L3-4 interbody fusion and zimmer DBM and insertion interbody prosthesis, posterior L3-5 instrumentation, however complicated most recently by early surgical site infection admitted 9/12 for I&D with retention hardware. Patient here for id clinic f/u   Reviewed 9/12 operative note. Hardware retained. Lot of pus in sq space; compromised lumbosacral fascia with pus tracking down to spine/hardware. Wound cx mssa   No sepsis this admission otherwise. No bcx obtained.   Given recent hardware placement <30 days, discharged on combination abx with rifampin  Developed rash on vanc/cefepime. Did ok on cefazolin though  ------- 10/4 id clinic f/u Fatigue/generalized weakness/poor apptite likely abx combination and ongoing pain/pain meds (taking extended oxycodone currently). Hct down and lft up considered rifampin side effect Also has a  forehead lesion that is suggestive of shingle. ?cause for crp elevation Pain in lower back severe but stable  without neurologic deficit. Incision intact. Patient to discuss pain management with nsg today  At this time, will stop rifampin and continue following labs Will see back in around 3 weeks for follow up   10/26 id clinic assessment Crp down Some intermittent serosanguinous stain at the incision but no dehiscence; slight erythema around it but ?related to tape/dressing  Fatigue/poor appetite likely multifactorial, low hemoglobin, infection, and ?as below suggested   New legs edema/ongoing fatigue query chf. No previous echo available; no hx chf. Needs chf evaluation. Cefazolin not typical to cause edema   -stop cefazolin; start cefadroxil 1 gram po twice a day -remove picc -follow up with NSG about the incision discharge -follow up with me in 4 weeks -talk to pcp about leg swelling   Follow-up: Return in about 4 weeks (around 11/06/2021).      Jabier Mutton, [place] for [place] [phone]  pager   [phone] cell 10/09/2021, 9:54 AM

## 2021-10-09 NOTE — Progress Notes (Signed)
   Covid-19 Vaccination Clinic  Name:  Brandon Fleming    MRN: [id number] DOB: 1946/12/13  10/09/2021  [name] was observed post Covid-19 immunization for 15 minutes without incident. He was provided with Vaccine Information Sheet and instruction to access the V-Safe system.   [name] was instructed to call 911 with any severe reactions post vaccine: Difficulty breathing  Swelling of face and throat  A fast heartbeat  A bad rash all over body  Dizziness and weakness   Immunizations Administered     Name Date Dose VIS Date Route   Pfizer Covid-19 Vaccine Bivalent Booster 10/09/2021  3:06 PM 0.3 mL 08/14/2021 Intramuscular   Manufacturer: [place]   Lot: [id number]   [place]: [phone]

## 2021-10-14 DIAGNOSIS — T8149XA Infection following a procedure, other surgical site, initial encounter: Secondary | ICD-10-CM | POA: Diagnosis not present

## 2021-10-14 DIAGNOSIS — F419 Anxiety disorder, unspecified: Secondary | ICD-10-CM | POA: Diagnosis not present

## 2021-10-14 DIAGNOSIS — F32A Depression, unspecified: Secondary | ICD-10-CM | POA: Diagnosis not present

## 2021-10-14 DIAGNOSIS — I10 Essential (primary) hypertension: Secondary | ICD-10-CM | POA: Diagnosis not present

## 2021-10-14 DIAGNOSIS — T84296D Other mechanical complication of internal fixation device of vertebrae, subsequent encounter: Secondary | ICD-10-CM | POA: Diagnosis not present

## 2021-10-14 DIAGNOSIS — Z452 Encounter for adjustment and management of vascular access device: Secondary | ICD-10-CM | POA: Diagnosis not present

## 2021-10-15 DIAGNOSIS — Z6824 Body mass index (BMI) 24.0-24.9, adult: Secondary | ICD-10-CM | POA: Diagnosis not present

## 2021-10-15 DIAGNOSIS — I10 Essential (primary) hypertension: Secondary | ICD-10-CM | POA: Diagnosis not present

## 2021-10-15 DIAGNOSIS — T847XXD Infection and inflammatory reaction due to other internal orthopedic prosthetic devices, implants and grafts, subsequent encounter: Secondary | ICD-10-CM | POA: Diagnosis not present

## 2021-10-31 DIAGNOSIS — H903 Sensorineural hearing loss, bilateral: Secondary | ICD-10-CM | POA: Diagnosis not present

## 2021-10-31 DIAGNOSIS — H838X3 Other specified diseases of inner ear, bilateral: Secondary | ICD-10-CM | POA: Diagnosis not present

## 2021-11-01 ENCOUNTER — Ambulatory Visit (INDEPENDENT_AMBULATORY_CARE_PROVIDER_SITE_OTHER): Payer: Medicare Other | Admitting: Internal Medicine

## 2021-11-01 ENCOUNTER — Other Ambulatory Visit: Payer: Self-pay

## 2021-11-01 ENCOUNTER — Encounter: Payer: Self-pay | Admitting: Internal Medicine

## 2021-11-01 VITALS — BP 120/74 | HR 87 | Temp 97.8°F | Resp 16 | Ht 66.0 in | Wt 153.3 lb

## 2021-11-01 DIAGNOSIS — T847XXD Infection and inflammatory reaction due to other internal orthopedic prosthetic devices, implants and grafts, subsequent encounter: Secondary | ICD-10-CM

## 2021-11-01 DIAGNOSIS — M4626 Osteomyelitis of vertebra, lumbar region: Secondary | ICD-10-CM | POA: Diagnosis not present

## 2021-11-01 NOTE — Patient Instructions (Signed)
You are doing great  Will check blood tests today to make sure things from the micro perspective is also agreeing with what we see  We'll see you in around 10 weeks  Continue the oral antibiotics as is; at around 2 more months from now, potentially if labs continue to look good and so do you, we can decrease the dose of the oral antibiotics

## 2021-11-01 NOTE — Progress Notes (Signed)
[facility] for Infectious Disease  Patient Active Problem List   Diagnosis Date Noted   Wound infection complicating hardware ([place]) 08/26/2021   Postoperative complication of skin involving drainage from surgical wound 08/26/2021   Lumbar adjacent segment disease with spondylolisthesis 08/14/2021   Insomnia 04/19/2020   Dislocated intraocular lens 05/18/2018   Dislocated intraocular lens, sequela 05/05/2018   Spondylolisthesis of lumbar region 05/05/2018   Memory difficulties 04/08/2018   Gait abnormality 04/08/2018   Alcohol withdrawal ([place]) 02/28/2018   Weakness 02/26/2018   Essential hypertension 02/26/2018   HLD (hyperlipidemia) 02/26/2018   Tremors of nervous system 02/26/2018   Alcohol abuse 02/26/2018   Weakness generalized 02/26/2018      Subjective:    Patient ID: KARIN GRIFFITH, male    DOB: 06-19-1946, 75 y.o.   MRN: [id number]  Chief Complaint  Patient presents with   Follow-up    New abx are doing better eating with it now.     HPI:  DAMEION BRILES is a 75 y.o. male hx l4-5 fusion 2019 with recurrent back pain/neurogenic claudication, s/p on 8/31 L3-4 laminotomy/facetectomy (complex surgical procedure due to prior lumbar intervody fusion), right L3-4 interbody fusion and zimmer DBM and insertion interbody prosthesis, posterior L3-5 instrumentation, however complicated most recently by early surgical site infection admitted 9/12 for I&D with retention hardware   I saw paitent during admission 08/26/21.  Reviewed 9/12 operative note. Lot of pus in sq space; compromised lumbosacral fascia with pus tracking down to spine/hardware. Wound cx mssa. Hardware left in place. No blood cx obtained He had a rash while on cefepime/vanc, but tolerated cefazolin Discharged on rifampin and cefazolin Plan 6 weeks cefazolin until 10/24 then to transition to cefadroxil for chronic suppression along with 3 months rifampin   ------ 10/04 id clinic f/u Patient  had 2 weeks burning 4 lesions on his forehead; was skin color then turned erythematous. No vessicles. Also decreased appetite, generalized weakness since discharge, worse Pain same in the back; severe. Difficulty getting around. He gets around though with FWW No LE neurological deficit or urinary bowel incontinence No n/v/diarrhea No joint pain muscle ache otherwise Feels cold all the time No fever No sorethroat/cough No sick contact  [facility] labs reviewed Crp up the past week Lft up  Hct down  10/26 id f/u Shingle on forehead had resolved Still with poor appetite, malaise New legs edema -- concerned if cefazolin can do that There is occasional serosanguinous stain at incision. Pain stable mild-moderate. Patient ambulatory No fever, chill No diarrhea, n/v  Labs not available  11/18 id f/u Doing really well Wound all closed  Tolerate oral cefadroxil which was started 10/26... no further fatigue No gi sx   Allergies  Allergen Reactions   Aricept [Donepezil Hcl] Diarrhea and Other (See Comments)    Diarrhea, stomach problems.    Atropine Swelling and Other (See Comments)    Eyes swollen and red   Other Other (See Comments)    Moisturizing eye drop - unknown      Outpatient Medications Prior to Visit  Medication Sig Dispense Refill   amLODipine-valsartan (EXFORGE) 10-160 MG tablet Take 1 tablet by mouth daily.     cefadroxil (DURICEF) 1 g tablet Take 1 tablet (1 g total) by mouth 2 (two) times daily. 60 tablet 11   cyclobenzaprine (FLEXERIL) 10 MG tablet Take 1 tablet (10 mg total) by mouth 3 (three) times daily as needed for muscle spasms.  30 tablet 0   diphenhydramine-acetaminophen (TYLENOL PM) 25-500 MG TABS tablet Take 2 tablets by mouth at bedtime as needed (sleep).     docusate sodium (COLACE) 100 MG capsule Take 1 capsule (100 mg total) by mouth 2 (two) times daily. (Patient taking differently: Take 200 mg by mouth daily.) 10 capsule 0   fenofibrate  (TRICOR) 145 MG tablet Take 1 tablet by mouth daily.     FLUoxetine (PROZAC) 20 MG capsule TAKE 1 CAPSULE BY MOUTH EVERY DAY (Patient taking differently: Take 20 mg by mouth daily.) 90 capsule 3   folic acid (FOLVITE) 1 MG tablet Take 1 mg by mouth daily.     gabapentin (NEURONTIN) 300 MG capsule Take by mouth.     influenza vaccine adjuvanted (FLUAD) 0.5 ML injection Inject into the muscle. 0.5 mL 0   levothyroxine (SYNTHROID) 50 MCG tablet Take 50 mcg by mouth daily before breakfast.     memantine (NAMENDA XR) 28 MG CP24 24 hr capsule Take 1 capsule (28 mg total) by mouth daily. 90 capsule 3   mirtazapine (REMERON) 30 MG tablet TAKE 1 TABLET BY MOUTH AT BEDTIME. (Patient taking differently: Take 30 mg by mouth at bedtime.) 90 tablet 3   Multiple Vitamins-Minerals (MULTIVITAMIN WITH MINERALS) tablet Take 1 tablet by mouth daily.     ondansetron (ZOFRAN) 4 MG tablet 1 tablet     oxyCODONE 10 MG TABS Take 1 tablet (10 mg total) by mouth every 3 (three) hours as needed for severe pain ((score 7 to 10)). 30 tablet 0   pantoprazole (PROTONIX) 40 MG tablet Take 1 tablet by mouth daily.     rosuvastatin (CRESTOR) 10 MG tablet Take 10 mg by mouth daily.      thiamine 100 MG tablet Take 100 mg by mouth daily.     XTAMPZA ER 18 MG C12A Take 1 capsule by mouth every 12 (twelve) hours.     traMADol (ULTRAM) 50 MG tablet Take 50 mg by mouth every 6 (six) hours as needed for moderate pain. (Patient not taking: Reported on 11/01/2021)     COVID-19 mRNA bivalent vaccine, Pfizer, (PFIZER COVID-19 VAC BIVALENT) injection Inject into the muscle. 0.3 mL 0   fenofibrate (TRICOR) 145 MG tablet Take 145 mg by mouth daily.     ondansetron (ZOFRAN-ODT) 4 MG disintegrating tablet Take 4 mg by mouth every 8 (eight) hours as needed for nausea or vomiting.     pantoprazole (PROTONIX) 40 MG tablet Take 40 mg by mouth daily.     No facility-administered medications prior to visit.     Social History   Socioeconomic  History   Marital status: Married    Spouse name: Not on file   Number of children: 1   Years of education: 16   Highest education level: Master's degree (e.g., MA, MS, MEng, MEd, MSW, MBA)  Occupational History   Occupation: Retired  Tobacco Use   Smoking status: Former    Types: Cigarettes    Quit date: 1978    Years since quitting: 44.9   Smokeless tobacco: Never  Vaping Use   Vaping Use: Never used  Substance and Sexual Activity   Alcohol use: Yes    Comment: 8-10 ounces daily   Drug use: No   Sexual activity: Not on file  Other Topics Concern   Not on file  Social History Narrative   Lives at home with his wife.   Right-handed.   Caffeine- minimal use (4 ounces tea/soda per day)  Social Determinants of Health   Financial Resource Strain: Not on file  Food Insecurity: Not on file  Transportation Needs: Not on file  Physical Activity: Not on file  Stress: Not on file  Social Connections: Not on file  Intimate Partner Violence: Not on file      Review of Systems    All other ros negative  Objective:    BP 120/74   Pulse 87   Temp 97.8 F (36.6 C) (Oral)   Resp 16   Ht 5\' 6"  (1.676 m)   Wt 153 lb 4.8 oz (69.5 kg)   SpO2 100%   BMI 24.74 kg/m  Nursing note and vital signs reviewed.  Physical Exam     General/constitutional: no distress, pleasant HEENT: Normocephalic, PER, Conj Clear, EOMI, Oropharynx clear Neck supple CV: rrr no mrg Lungs: clear to auscultation, normal respiratory effort Abd: Soft, Nontender Ext: no edema Skin: No Rash -- lower back incision all closed up -- no erythema/tenderness on palpation there Neuro: nonfocal MSK: no peripheral joint swelling/tenderness/warmth; back spines nontender   Central line presence: no    Labs: Lab Results  Component Value Date   WBC 8.0 08/27/2021   HGB 9.8 (L) 08/27/2021   HCT 28.0 (L) 08/27/2021   MCV 94.9 08/27/2021   PLT 229 82/99/3716   Last metabolic panel Lab Results   Component Value Date   GLUCOSE 117 (H) 08/29/2021   NA 136 08/29/2021   K 3.2 (L) 08/29/2021   CL 109 08/29/2021   CO2 20 (L) 08/29/2021   BUN 26 (H) 08/29/2021   CREATININE 1.59 (H) 08/29/2021   GFRNONAA 45 (L) 08/29/2021   CALCIUM 8.3 (L) 08/29/2021   PROT 7.0 04/23/2018   ALBUMIN 4.1 04/23/2018   BILITOT 0.6 04/23/2018   ALKPHOS 48 04/23/2018   AST 47 (H) 04/23/2018   ALT 26 04/23/2018   ANIONGAP 7 08/29/2021    [facility]: 10/25 cbc 4/8.2/177; cr 0.98 10/18 cbc 6/8.6/251; cr 1.02 9/27 cbc 6/8/225 normal diff; cr 1.04 9/19 cbc 9/9/332; cr 1.02; lft 88/17/151/0.7  Crp: 10/25    16 (<10) 10/18    13 (<10) 9/29    106 (<10) 9/19      52 (<10)  Micro:  Serology:  Imaging:  Assessment & Plan:   Problem List Items Addressed This Visit       Other   Wound infection complicating hardware ([place])   Relevant Orders   CBC   Comprehensive metabolic panel   C-reactive protein   Other Visit Diagnoses     Acute osteomyelitis of lumbar spine ([place])    -  Primary   Relevant Orders   CBC   Comprehensive metabolic panel   C-reactive protein        No orders of the defined types were placed in this encounter.   Abx: 10/26-c cefadroxil  9/15-10/26 cefazolin 9/17-10/04 rifampin 300 mg po bid   9/12-15 vanc 9/12-15 cefepime   ASSESSMENT: Surgical site infection/vertebral om lumbar Hardware complicating wound infection Rash suspect abx allergy to cefepime, less likely vanc   75 yo male hx l4-5 fusion 2019 with recurrent back pain/neurogenic claudication, s/p on 8/31 L3-4 laminotomy/facetectomy (complex surgical procedure due to prior lumbar intervody fusion), right L3-4 interbody fusion and zimmer DBM and insertion interbody prosthesis, posterior L3-5 instrumentation, however complicated most recently by early surgical site infection admitted 9/12 for I&D with retention hardware. Patient here for id clinic f/u   Reviewed 9/12 operative note. Hardware retained.  Lot of  pus in sq space; compromised lumbosacral fascia with pus tracking down to spine/hardware. Wound cx mssa   No sepsis this admission otherwise. No bcx obtained.   Given recent hardware placement <30 days, discharged on combination abx with rifampin  Developed rash on vanc/cefepime. Did ok on cefazolin though  ------- 10/4 id clinic f/u Fatigue/generalized weakness/poor apptite likely abx combination and ongoing pain/pain meds (taking extended oxycodone currently). Hct down and lft up considered rifampin side effect Also has a forehead lesion that is suggestive of shingle. ?cause for crp elevation Pain in lower back severe but stable without neurologic deficit. Incision intact. Patient to discuss pain management with nsg today  At this time, will stop rifampin and continue following labs Will see back in around 3 weeks for follow up   10/26 id clinic assessment Crp down Some intermittent serosanguinous stain at the incision but no dehiscence; slight erythema around it but ?related to tape/dressing  Fatigue/poor appetite likely multifactorial, low hemoglobin, infection, and ?as below suggested   New legs edema/ongoing fatigue query chf. No previous echo available; no hx chf. Needs chf evaluation. Cefazolin not typical to cause edema  11/18 id assessment Doing well today on oral cefadroxil. Sx from previous 2 clinic visits (fatigue/leg edema) no longer present. Not really sure what it was although temporally related to iv abx use and rifampin  Plan indefinite cefadroxil, maybe decreased dose in another 2-3 months depending on clinical status/improvement -continue cefadroxil -labs today -f/u 10 weeks  Follow-up: Return in about 10 weeks (around 01/10/2022).      Jabier Mutton, [facility] [place] [phone]  pager   [phone] cell 11/01/2021, 9:00 AM

## 2021-11-02 LAB — COMPREHENSIVE METABOLIC PANEL
AG Ratio: 0.8 (calc) — ABNORMAL LOW (ref 1.0–2.5)
ALT: 7 U/L — ABNORMAL LOW (ref 9–46)
AST: 32 U/L (ref 10–35)
Albumin: 3.2 g/dL — ABNORMAL LOW (ref 3.6–5.1)
Alkaline phosphatase (APISO): 78 U/L (ref 35–144)
BUN: 13 mg/dL (ref 7–25)
CO2: 24 mmol/L (ref 20–32)
Calcium: 9 mg/dL (ref 8.6–10.3)
Chloride: 109 mmol/L (ref 98–110)
Creat: 1.13 mg/dL (ref 0.70–1.28)
Globulin: 3.8 g/dL (calc) — ABNORMAL HIGH (ref 1.9–3.7)
Glucose, Bld: 95 mg/dL (ref 65–99)
Potassium: 4.5 mmol/L (ref 3.5–5.3)
Sodium: 143 mmol/L (ref 135–146)
Total Bilirubin: 0.7 mg/dL (ref 0.2–1.2)
Total Protein: 7 g/dL (ref 6.1–8.1)

## 2021-11-02 LAB — CBC
HCT: 29.9 % — ABNORMAL LOW (ref 38.5–50.0)
Hemoglobin: 9.8 g/dL — ABNORMAL LOW (ref 13.2–17.1)
MCH: 31.2 pg (ref 27.0–33.0)
MCHC: 32.8 g/dL (ref 32.0–36.0)
MCV: 95.2 fL (ref 80.0–100.0)
MPV: 11.3 fL (ref 7.5–12.5)
Platelets: 292 10*3/uL (ref 140–400)
RBC: 3.14 10*6/uL — ABNORMAL LOW (ref 4.20–5.80)
RDW: 13.7 % (ref 11.0–15.0)
WBC: 4.7 10*3/uL (ref 3.8–10.8)

## 2021-11-02 LAB — C-REACTIVE PROTEIN: CRP: 6.5 mg/L (ref <8.0)

## 2021-11-14 DIAGNOSIS — H903 Sensorineural hearing loss, bilateral: Secondary | ICD-10-CM | POA: Diagnosis not present

## 2021-12-12 DIAGNOSIS — R7301 Impaired fasting glucose: Secondary | ICD-10-CM | POA: Diagnosis not present

## 2021-12-12 DIAGNOSIS — E785 Hyperlipidemia, unspecified: Secondary | ICD-10-CM | POA: Diagnosis not present

## 2021-12-12 DIAGNOSIS — E039 Hypothyroidism, unspecified: Secondary | ICD-10-CM | POA: Diagnosis not present

## 2021-12-12 DIAGNOSIS — Z125 Encounter for screening for malignant neoplasm of prostate: Secondary | ICD-10-CM | POA: Diagnosis not present

## 2021-12-17 DIAGNOSIS — D649 Anemia, unspecified: Secondary | ICD-10-CM | POA: Diagnosis not present

## 2022-01-07 DIAGNOSIS — Z1212 Encounter for screening for malignant neoplasm of rectum: Secondary | ICD-10-CM | POA: Diagnosis not present

## 2022-01-07 DIAGNOSIS — R82998 Other abnormal findings in urine: Secondary | ICD-10-CM | POA: Diagnosis not present

## 2022-01-08 ENCOUNTER — Ambulatory Visit: Payer: Medicare Other | Attending: Student in an Organized Health Care Education/Training Program

## 2022-01-08 DIAGNOSIS — Z45018 Encounter for adjustment and management of other part of cardiac pacemaker: Secondary | ICD-10-CM

## 2022-01-08 DIAGNOSIS — I442 Atrioventricular block, complete: Secondary | ICD-10-CM

## 2022-01-08 DIAGNOSIS — Z950 Presence of cardiac pacemaker: Secondary | ICD-10-CM | POA: Insufficient documentation

## 2022-01-09 ENCOUNTER — Telehealth (INDEPENDENT_AMBULATORY_CARE_PROVIDER_SITE_OTHER): Payer: Self-pay

## 2022-01-09 ENCOUNTER — Telehealth (INDEPENDENT_AMBULATORY_CARE_PROVIDER_SITE_OTHER): Payer: Self-pay | Admitting: Student in an Organized Health Care Education/Training Program

## 2022-01-09 NOTE — Telephone Encounter (Signed)
Per t/e 09/26/21 patient reported to be transferring care to Dr. Radene Knee at the time patient wasn't clear if he would be transferring device care as well. Patient is due for annual follow up with EP and device. We'll need to clarify with patient where he'll be following and whom will be accepting responsibility of pacemaker.

## 2022-01-09 NOTE — Telephone Encounter (Signed)
Pt called to notify to stop calling him [id number].  He sees an EP physician at [facility].

## 2022-01-14 ENCOUNTER — Ambulatory Visit: Payer: Medicare Other | Admitting: Internal Medicine

## 2022-01-14 DIAGNOSIS — H5213 Myopia, bilateral: Secondary | ICD-10-CM | POA: Diagnosis not present

## 2022-01-14 DIAGNOSIS — Z961 Presence of intraocular lens: Secondary | ICD-10-CM | POA: Diagnosis not present

## 2022-01-14 DIAGNOSIS — H40012 Open angle with borderline findings, low risk, left eye: Secondary | ICD-10-CM | POA: Diagnosis not present

## 2022-01-14 DIAGNOSIS — H21232 Degeneration of iris (pigmentary), left eye: Secondary | ICD-10-CM | POA: Diagnosis not present

## 2022-01-17 ENCOUNTER — Ambulatory Visit: Payer: Medicare Other | Admitting: Internal Medicine

## 2022-01-17 NOTE — Telephone Encounter (Signed)
Patient was seen on 11/06/21 for  PM [facility]. Follow up Remote/ [facility] on 02/03/22, 05/04/22, 08/03/22 and return to Clinic in November 2023 with same day Dr. Cherly Hensenhang appointment.     Closing this encounter.

## 2022-01-21 ENCOUNTER — Ambulatory Visit: Payer: Medicare Other | Admitting: Internal Medicine

## 2022-02-04 DIAGNOSIS — M5136 Other intervertebral disc degeneration, lumbar region: Secondary | ICD-10-CM | POA: Diagnosis not present

## 2022-02-04 DIAGNOSIS — T847XXD Infection and inflammatory reaction due to other internal orthopedic prosthetic devices, implants and grafts, subsequent encounter: Secondary | ICD-10-CM | POA: Diagnosis not present

## 2022-02-04 DIAGNOSIS — I10 Essential (primary) hypertension: Secondary | ICD-10-CM | POA: Diagnosis not present

## 2022-02-11 ENCOUNTER — Other Ambulatory Visit: Payer: Self-pay

## 2022-02-11 ENCOUNTER — Encounter: Payer: Self-pay | Admitting: Internal Medicine

## 2022-02-11 ENCOUNTER — Ambulatory Visit (INDEPENDENT_AMBULATORY_CARE_PROVIDER_SITE_OTHER): Payer: Medicare Other | Admitting: Internal Medicine

## 2022-02-11 VITALS — BP 125/85 | HR 81 | Temp 97.6°F | Wt 146.0 lb

## 2022-02-11 DIAGNOSIS — M8668 Other chronic osteomyelitis, other site: Secondary | ICD-10-CM

## 2022-02-11 DIAGNOSIS — T847XXD Infection and inflammatory reaction due to other internal orthopedic prosthetic devices, implants and grafts, subsequent encounter: Secondary | ICD-10-CM | POA: Diagnosis not present

## 2022-02-11 DIAGNOSIS — K921 Melena: Secondary | ICD-10-CM

## 2022-02-11 MED ORDER — ONDANSETRON HCL 4 MG PO TABS: 4.0000 mg | ORAL_TABLET | Freq: Three times a day (TID) | ORAL | 0 refills | Status: DC | PRN

## 2022-02-11 NOTE — Progress Notes (Signed)
[facility]  Patient Active Problem List   Diagnosis Date Noted   Wound infection complicating hardware ([place]) 08/26/2021   Postoperative complication of skin involving drainage from surgical wound 08/26/2021   Lumbar adjacent segment disease with spondylolisthesis 08/14/2021   Insomnia 04/19/2020   Dislocated intraocular lens 05/18/2018   Dislocated intraocular lens, sequela 05/05/2018   Spondylolisthesis of lumbar region 05/05/2018   Memory difficulties 04/08/2018   Gait abnormality 04/08/2018   Alcohol withdrawal ([place]) 02/28/2018   Weakness 02/26/2018   Essential hypertension 02/26/2018   HLD (hyperlipidemia) 02/26/2018   Tremors of nervous system 02/26/2018   Alcohol abuse 02/26/2018   Weakness generalized 02/26/2018      Subjective:    Patient ID: Brandon Fleming, male    DOB: 1946-01-17, 76 y.o.   MRN: [id number]  Chief Complaint  Patient presents with   Follow-up   Cc -- f/u lumbar spine om complicated by hardware presence  HPI:  Brandon Fleming is a 76 y.o. male hx l4-5 fusion 2019 with recurrent back pain/neurogenic claudication, s/p on 8/31 L3-4 laminotomy/facetectomy (complex surgical procedure due to prior lumbar interbody fusion), right L3-4 interbody fusion and zimmer DBM and insertion interbody prosthesis, posterior L3-5 instrumentation, however complicated most recently by early surgical site infection admitted 9/12 for I&D with retention hardware   I saw paitent during admission 08/26/21.  Reviewed 9/12 operative note. Lot of pus in sq space; compromised lumbosacral fascia with pus tracking down to spine/hardware. Wound cx mssa. Hardware left in place. No blood cx obtained He had a rash while on cefepime/vanc, but tolerated cefazolin Discharged on rifampin and cefazolin Plan 6 weeks cefazolin until 10/24 then to transition to cefadroxil for chronic suppression along with 3 months rifampin   ------ 10/04 id clinic  f/u Patient had 2 weeks burning 4 lesions on his forehead; was skin color then turned erythematous. No vessicles. Also decreased appetite, generalized weakness since discharge, worse Pain same in the back; severe. Difficulty getting around. He gets around though with FWW No LE neurological deficit or urinary bowel incontinence No n/v/diarrhea No joint pain muscle ache otherwise Feels cold all the time No fever No sorethroat/cough No sick contact  [facility] labs reviewed Crp up the past week Lft up  Hct down  10/26 id f/u Shingle on forehead had resolved Still with poor appetite, malaise New legs edema -- concerned if cefazolin can do that There is occasional serosanguinous stain at incision. Pain stable mild-moderate. Patient ambulatory No fever, chill No diarrhea, n/v  Labs not available  11/18 id f/u Doing really well Wound all closed  Tolerate oral cefadroxil which was started 10/26... no further fatigue No gi sx  02/11/22 id f/u Hasn't taken abx for the last 8 days as he wasn't feeling well. Started out as nausea/epigastric discomfort, then also loose stool to diarrhea progressing to tarry black stool Has not eating much if at all and there are associated fatigue, wobbly gait. No fever, chill No sick contact The stool diarrhea frequency has decreased although he questions if it is due to decreased intake. Stool is soft and more formed Has hx of esophageal dilatation due to stricture. This was last done early spring 2022 Hasn't taken any prednisone or nsaids leading up this   No new back pain  Allergies  Allergen Reactions   Aricept [Donepezil Hcl] Diarrhea and Other (See Comments)    Diarrhea, stomach problems.    Atropine Swelling  and Other (See Comments)    Eyes swollen and red   Other Other (See Comments)    Moisturizing eye drop - unknown      Outpatient Medications Prior to Visit  Medication Sig Dispense Refill   amLODipine-valsartan (EXFORGE)  10-160 MG tablet Take 1 tablet by mouth daily.     cyclobenzaprine (FLEXERIL) 10 MG tablet Take 1 tablet (10 mg total) by mouth 3 (three) times daily as needed for muscle spasms. 30 tablet 0   diphenhydramine-acetaminophen (TYLENOL PM) 25-500 MG TABS tablet Take 2 tablets by mouth at bedtime as needed (sleep).     docusate sodium (COLACE) 100 MG capsule Take 1 capsule (100 mg total) by mouth 2 (two) times daily. (Patient taking differently: Take 200 mg by mouth daily.) 10 capsule 0   fenofibrate (TRICOR) 145 MG tablet Take 1 tablet by mouth daily.     FLUoxetine (PROZAC) 20 MG capsule TAKE 1 CAPSULE BY MOUTH EVERY DAY (Patient taking differently: Take 20 mg by mouth daily.) 90 capsule 3   folic acid (FOLVITE) 1 MG tablet Take 1 mg by mouth daily.     gabapentin (NEURONTIN) 300 MG capsule Take by mouth.     influenza vaccine adjuvanted (FLUAD) 0.5 ML injection Inject into the muscle. 0.5 mL 0   levothyroxine (SYNTHROID) 50 MCG tablet Take 50 mcg by mouth daily before breakfast.     memantine (NAMENDA XR) 28 MG CP24 24 hr capsule Take 1 capsule (28 mg total) by mouth daily. 90 capsule 3   mirtazapine (REMERON) 30 MG tablet TAKE 1 TABLET BY MOUTH AT BEDTIME. (Patient taking differently: Take 30 mg by mouth at bedtime.) 90 tablet 3   Multiple Vitamins-Minerals (MULTIVITAMIN WITH MINERALS) tablet Take 1 tablet by mouth daily.     ondansetron (ZOFRAN) 4 MG tablet 1 tablet     oxyCODONE 10 MG TABS Take 1 tablet (10 mg total) by mouth every 3 (three) hours as needed for severe pain ((score 7 to 10)). 30 tablet 0   pantoprazole (PROTONIX) 40 MG tablet Take 1 tablet by mouth daily.     rosuvastatin (CRESTOR) 10 MG tablet Take 10 mg by mouth daily.      thiamine 100 MG tablet Take 100 mg by mouth daily.     traMADol (ULTRAM) 50 MG tablet Take 50 mg by mouth every 6 (six) hours as needed for moderate pain.     XTAMPZA ER 18 MG C12A Take 1 capsule by mouth every 12 (twelve) hours.     cefadroxil (DURICEF) 1 g  tablet Take 1 tablet (1 g total) by mouth 2 (two) times daily. (Patient not taking: Reported on 02/11/2022) 60 tablet 11   No facility-administered medications prior to visit.     Social History   Socioeconomic History   Marital status: Married    Spouse name: Not on file   Number of children: 1   Years of education: 16   Highest education level: Master's degree (e.g., MA, MS, MEng, MEd, MSW, MBA)  Occupational History   Occupation: Retired  Tobacco Use   Smoking status: Former    Types: Cigarettes    Quit date: 1978    Years since quitting: 45.1   Smokeless tobacco: Never  Vaping Use   Vaping Use: Never used  Substance and Sexual Activity   Alcohol use: Yes    Comment: 8-10 ounces daily   Drug use: No   Sexual activity: Not on file  Other Topics Concern   Not  on file  Social History Narrative   Lives at home with his wife.   Right-handed.   Caffeine- minimal use (4 ounces tea/soda per day)   Social Determinants of Health   Financial Resource Strain: Not on file  Food Insecurity: Not on file  Transportation Needs: Not on file  Physical Activity: Not on file  Stress: Not on file  Social Connections: Not on file  Intimate Partner Violence: Not on file      Review of Systems    All other ros negative  Objective:    BP 125/85    Pulse 81    Temp 97.6 F (36.4 C) (Oral)    Wt 146 lb (66.2 kg)    BMI 23.57 kg/m  Nursing note and vital signs reviewed.  Physical Exam     General/constitutional: no distress, pleasant HEENT: Normocephalic, PER, Conj Clear, EOMI, Oropharynx clear Neck supple CV: rrr no mrg Lungs: clear to auscultation, normal respiratory effort Abd: Soft, Nontender Ext: no edema Skin: No Rash Neuro: nonfocal MSK: no peripheral joint swelling/tenderness/warmth; back spines nontender      Labs: Lab Results  Component Value Date   WBC 4.7 11/01/2021   HGB 9.8 (L) 11/01/2021   HCT 29.9 (L) 11/01/2021   MCV 95.2 11/01/2021   PLT 292  06/09/9484   Last metabolic panel Lab Results  Component Value Date   GLUCOSE 95 11/01/2021   NA 143 11/01/2021   K 4.5 11/01/2021   CL 109 11/01/2021   CO2 24 11/01/2021   BUN 13 11/01/2021   CREATININE 1.13 11/01/2021   GFRNONAA 45 (L) 08/29/2021   CALCIUM 9.0 11/01/2021   PROT 7.0 11/01/2021   ALBUMIN 4.1 04/23/2018   BILITOT 0.7 11/01/2021   ALKPHOS 48 04/23/2018   AST 32 11/01/2021   ALT 7 (L) 11/01/2021   ANIONGAP 7 08/29/2021    [facility]: 10/25 cbc 4/8.2/177; cr 0.98 10/18 cbc 6/8.6/251; cr 1.02 9/27 cbc 6/8/225 normal diff; cr 1.04 9/19 cbc 9/9/332; cr 1.02; lft 88/17/151/0.7  Crp: 10/25    16 (<10) 10/18    13 (<10) 9/29    106 (<10) 9/19      52 (<10)  Micro:  Serology:  Imaging:  Assessment & Plan:   Problem List Items Addressed This Visit   None   No orders of the defined types were placed in this encounter.   Abx: 10/26-c cefadroxil  9/15-10/26 cefazolin 9/17-10/04 rifampin 300 mg po bid   9/12-15 vanc 9/12-15 cefepime   ASSESSMENT: Surgical site infection/vertebral om lumbar Hardware complicating wound infection Rash suspect abx allergy to cefepime, less likely vanc   76 yo male hx l4-5 fusion 2019 with recurrent back pain/neurogenic claudication, s/p on 8/31 L3-4 laminotomy/facetectomy (complex surgical procedure due to prior lumbar intervody fusion), right L3-4 interbody fusion and zimmer DBM and insertion interbody prosthesis, posterior L3-5 instrumentation, however complicated most recently by early surgical site infection admitted 9/12 for I&D with retention hardware. Patient here for id clinic f/u   Reviewed 9/12 operative note. Hardware retained. Lot of pus in sq space; compromised lumbosacral fascia with pus tracking down to spine/hardware. Wound cx mssa   No sepsis this admission otherwise. No bcx obtained.   Given recent hardware placement <30 days, discharged on combination abx with rifampin  Developed rash on  vanc/cefepime. Did ok on cefazolin though  ------- 10/4 id clinic f/u Fatigue/generalized weakness/poor apptite likely abx combination and ongoing pain/pain meds (taking extended oxycodone currently). Hct down and lft up considered  rifampin side effect Also has a forehead lesion that is suggestive of shingle. ?cause for crp elevation Pain in lower back severe but stable without neurologic deficit. Incision intact. Patient to discuss pain management with nsg today  At this time, will stop rifampin and continue following labs Will see back in around 3 weeks for follow up   10/26 id clinic assessment Crp down Some intermittent serosanguinous stain at the incision but no dehiscence; slight erythema around it but ?related to tape/dressing  Fatigue/poor appetite likely multifactorial, low hemoglobin, infection, and ?as below suggested   New legs edema/ongoing fatigue query chf. No previous echo available; no hx chf. Needs chf evaluation. Cefazolin not typical to cause edema  11/18 id assessment Doing well today on oral cefadroxil. Sx from previous 2 clinic visits (fatigue/leg edema) no longer present. Not really sure what it was although temporally related to iv abx use and rifampin  Plan indefinite cefadroxil, maybe decreased dose in another 2-3 months depending on clinical status/improvement  2/28 id assessment Gi sx / tarry stool ?PUD with slow upper gib? Diarrhea slowed down since off abx -- might not be related given duration of time he has been on abx. Doesn't seem to be cdiff Discuss importance of staying on abx due to metals/back infection Also considered dehydration related acute renal injury causing more nausea   -restart cefadroxil -take zofran as needed for nausea -advised patient to talk with GI doctor about potential upper GIB -check cbc today and also cmp and crp -follow up next week with me  Follow-up: Return in about 1 week (around 02/18/2022).      Jabier Mutton,  [facility] [phone]  pager   [phone] cell 02/11/2022, 9:07 AM

## 2022-02-11 NOTE — Patient Instructions (Signed)
Please see your gi doctor this week and discuss with them you have been having black tarry stool, dizziness, and epigastric discomfort/nausea  Labs today here  Take zofran as needed for nausea  See me next week Make sure you get back on cefadroxil

## 2022-02-12 DIAGNOSIS — R112 Nausea with vomiting, unspecified: Secondary | ICD-10-CM | POA: Diagnosis not present

## 2022-02-12 LAB — COMPLETE METABOLIC PANEL WITH GFR
AG Ratio: 1.4 (calc) (ref 1.0–2.5)
ALT: 56 U/L — ABNORMAL HIGH (ref 9–46)
AST: 147 U/L — ABNORMAL HIGH (ref 10–35)
Albumin: 4.1 g/dL (ref 3.6–5.1)
Alkaline phosphatase (APISO): 91 U/L (ref 35–144)
BUN/Creatinine Ratio: 29 (calc) — ABNORMAL HIGH (ref 6–22)
BUN: 47 mg/dL — ABNORMAL HIGH (ref 7–25)
CO2: 24 mmol/L (ref 20–32)
Calcium: 9.9 mg/dL (ref 8.6–10.3)
Chloride: 104 mmol/L (ref 98–110)
Creat: 1.63 mg/dL — ABNORMAL HIGH (ref 0.70–1.28)
Globulin: 3 g/dL (calc) (ref 1.9–3.7)
Glucose, Bld: 100 mg/dL — ABNORMAL HIGH (ref 65–99)
Potassium: 4.3 mmol/L (ref 3.5–5.3)
Sodium: 139 mmol/L (ref 135–146)
Total Bilirubin: 0.8 mg/dL (ref 0.2–1.2)
Total Protein: 7.1 g/dL (ref 6.1–8.1)
eGFR: 44 mL/min/{1.73_m2} — ABNORMAL LOW (ref >=60)

## 2022-02-12 LAB — CBC WITH DIFFERENTIAL/PLATELET
Absolute Monocytes: 414 cells/uL (ref 200–950)
Basophils Absolute: 29 cells/uL (ref 0–200)
Basophils Relative: 0.7 %
Eosinophils Absolute: 131 cells/uL (ref 15–500)
Eosinophils Relative: 3.2 %
HCT: 38.4 % — ABNORMAL LOW (ref 38.5–50.0)
Hemoglobin: 12.6 g/dL — ABNORMAL LOW (ref 13.2–17.1)
Lymphs Abs: 738 cells/uL — ABNORMAL LOW (ref 850–3900)
MCH: 29.4 pg (ref 27.0–33.0)
MCHC: 32.8 g/dL (ref 32.0–36.0)
MCV: 89.5 fL (ref 80.0–100.0)
MPV: 12.6 fL — ABNORMAL HIGH (ref 7.5–12.5)
Monocytes Relative: 10.1 %
Neutro Abs: 2788 cells/uL (ref 1500–7800)
Neutrophils Relative %: 68 %
Platelets: 201 10*3/uL (ref 140–400)
RBC: 4.29 10*6/uL (ref 4.20–5.80)
RDW: 12.8 % (ref 11.0–15.0)
Total Lymphocyte: 18 %
WBC: 4.1 10*3/uL (ref 3.8–10.8)

## 2022-02-12 LAB — C-REACTIVE PROTEIN: CRP: 14.2 mg/L — ABNORMAL HIGH (ref <8.0)

## 2022-02-13 ENCOUNTER — Telehealth: Payer: Self-pay

## 2022-02-13 DIAGNOSIS — K2980 Duodenitis without bleeding: Secondary | ICD-10-CM | POA: Diagnosis not present

## 2022-02-13 DIAGNOSIS — K921 Melena: Secondary | ICD-10-CM | POA: Diagnosis not present

## 2022-02-13 DIAGNOSIS — K269 Duodenal ulcer, unspecified as acute or chronic, without hemorrhage or perforation: Secondary | ICD-10-CM | POA: Diagnosis not present

## 2022-02-13 DIAGNOSIS — K2210 Ulcer of esophagus without bleeding: Secondary | ICD-10-CM | POA: Diagnosis not present

## 2022-02-13 DIAGNOSIS — R1013 Epigastric pain: Secondary | ICD-10-CM | POA: Diagnosis not present

## 2022-02-13 NOTE — Telephone Encounter (Signed)
-----   Message from Jabier Mutton, MD sent at 02/13/2022 10:37 AM EST ----- ?Please advise patient his kidney function is a little weak from likely dehydration. ? ?Make sure he takes ensure and gatorade ? ?Please also ask him to see me in around 2-4 weeks if he haven't scheduled already ? ?Thank you ?

## 2022-02-13 NOTE — Telephone Encounter (Signed)
Spoke with patient and his wife, relayed that kidney function is weak and patient should hydrate with ensure, Gatorade, and water.  ? ?Patient has endoscopy scheduled for this afternoon. Advised he should remain NPO until this is completed and then work on staying hydrated after the procedure. Patient verbalized understanding and has no further questions.  ?Reminded them of follow up next week. ? ?Beryle Flock, RN ? ?

## 2022-02-19 DIAGNOSIS — K2980 Duodenitis without bleeding: Secondary | ICD-10-CM | POA: Diagnosis not present

## 2022-02-20 ENCOUNTER — Ambulatory Visit (INDEPENDENT_AMBULATORY_CARE_PROVIDER_SITE_OTHER): Payer: Medicare Other | Admitting: Internal Medicine

## 2022-02-20 ENCOUNTER — Other Ambulatory Visit: Payer: Self-pay

## 2022-02-20 VITALS — BP 121/83 | HR 82 | Resp 16 | Ht 66.0 in | Wt 149.0 lb

## 2022-02-20 DIAGNOSIS — M8668 Other chronic osteomyelitis, other site: Secondary | ICD-10-CM

## 2022-02-20 DIAGNOSIS — T847XXD Infection and inflammatory reaction due to other internal orthopedic prosthetic devices, implants and grafts, subsequent encounter: Secondary | ICD-10-CM | POA: Diagnosis not present

## 2022-02-20 NOTE — Patient Instructions (Signed)
Please make sure you are taking your antibiotics cefadroxil twice a day. Taking it once a day at this time might put you at risk for developing resistance to the antibiotics ? ? ?Make sure you eat/drink anything to keep up energy level ? ? ?Follow up with me in 6-8 weeks ?

## 2022-02-20 NOTE — Progress Notes (Signed)
[facility]  Patient Active Problem List   Diagnosis Date Noted   Wound infection complicating hardware ([place]) 08/26/2021   Postoperative complication of skin involving drainage from surgical wound 08/26/2021   Lumbar adjacent segment disease with spondylolisthesis 08/14/2021   Insomnia 04/19/2020   Dislocated intraocular lens 05/18/2018   Dislocated intraocular lens, sequela 05/05/2018   Spondylolisthesis of lumbar region 05/05/2018   Memory difficulties 04/08/2018   Gait abnormality 04/08/2018   Alcohol withdrawal ([place]) 02/28/2018   Weakness 02/26/2018   Essential hypertension 02/26/2018   HLD (hyperlipidemia) 02/26/2018   Tremors of nervous system 02/26/2018   Alcohol abuse 02/26/2018   Weakness generalized 02/26/2018      Subjective:    Patient ID: Brandon [facility], male    DOB: Jul 28, 1946, 76 y.o.   MRN: [id number]  No chief complaint on file.  Cc -- f/u lumbar spine om complicated by hardware presence  HPI:  Brandon Fleming is a 76 y.o. male hx l4-5 fusion 2019 with recurrent back pain/neurogenic claudication, s/p on 8/31 L3-4 laminotomy/facetectomy (complex surgical procedure due to prior lumbar interbody fusion), right L3-4 interbody fusion and zimmer DBM and insertion interbody prosthesis, posterior L3-5 instrumentation, however complicated most recently by early surgical site infection admitted 9/12 for I&D with retention hardware   I saw paitent during admission 08/26/21.  Reviewed 9/12 operative note. Lot of pus in sq space; compromised lumbosacral fascia with pus tracking down to spine/hardware. Wound cx mssa. Hardware left in place. No blood cx obtained He had a rash while on cefepime/vanc, but tolerated cefazolin Discharged on rifampin and cefazolin Plan 6 weeks cefazolin until 10/24 then to transition to cefadroxil for chronic suppression along with 3 months rifampin   ------ 10/04 id clinic f/u Patient had 2 weeks  burning 4 lesions on his forehead; was skin color then turned erythematous. No vessicles. Also decreased appetite, generalized weakness since discharge, worse Pain same in the back; severe. Difficulty getting around. He gets around though with FWW No LE neurological deficit or urinary bowel incontinence No n/v/diarrhea No joint pain muscle ache otherwise Feels cold all the time No fever No sorethroat/cough No sick contact  [facility] labs reviewed Crp up the past week Lft up  Hct down  10/26 id f/u Shingle on forehead had resolved Still with poor appetite, malaise New legs edema -- concerned if cefazolin can do that There is occasional serosanguinous stain at incision. Pain stable mild-moderate. Patient ambulatory No fever, chill No diarrhea, n/v  Labs not available  11/18 id f/u Doing really well Wound all closed  Tolerate oral cefadroxil which was started 10/26... no further fatigue No gi sx  02/11/22 id f/u Hasn't taken abx for the last 8 days as he wasn't feeling well. Started out as nausea/epigastric discomfort, then also loose stool to diarrhea progressing to tarry black stool Has not eating much if at all and there are associated fatigue, wobbly gait. No fever, chill No sick contact The stool diarrhea frequency has decreased although he questions if it is due to decreased intake. Stool is soft and more formed Has hx of esophageal dilatation due to stricture. This was last done early spring 2022 Hasn't taken any prednisone or nsaids leading up this   No new back pain   3/09 id f/u Reviewed labs from 2/28 -- mild aki He was seen by GI right a way and patient's report there was lots of inflammation. Given some kind  of pills to take twice a day  He said zofran helping as well with nausea Eating a little better Overall feeling better with strength as well  No more black stool  Back on antibiotics but most days can only do once a day   Allergies  Allergen  Reactions   Aricept [Donepezil Hcl] Diarrhea and Other (See Comments)    Diarrhea, stomach problems.    Atropine Swelling and Other (See Comments)    Eyes swollen and red   Other Other (See Comments)    Moisturizing eye drop - unknown      Outpatient Medications Prior to Visit  Medication Sig Dispense Refill   amLODipine-valsartan (EXFORGE) 10-160 MG tablet Take 1 tablet by mouth daily.     cefadroxil (DURICEF) 1 g tablet Take 1 tablet (1 g total) by mouth 2 (two) times daily. (Patient not taking: Reported on 02/11/2022) 60 tablet 11   cyclobenzaprine (FLEXERIL) 10 MG tablet Take 1 tablet (10 mg total) by mouth 3 (three) times daily as needed for muscle spasms. 30 tablet 0   diphenhydramine-acetaminophen (TYLENOL PM) 25-500 MG TABS tablet Take 2 tablets by mouth at bedtime as needed (sleep).     docusate sodium (COLACE) 100 MG capsule Take 1 capsule (100 mg total) by mouth 2 (two) times daily. (Patient taking differently: Take 200 mg by mouth daily.) 10 capsule 0   fenofibrate (TRICOR) 145 MG tablet Take 1 tablet by mouth daily.     FLUoxetine (PROZAC) 20 MG capsule TAKE 1 CAPSULE BY MOUTH EVERY DAY (Patient taking differently: Take 20 mg by mouth daily.) 90 capsule 3   folic acid (FOLVITE) 1 MG tablet Take 1 mg by mouth daily.     gabapentin (NEURONTIN) 300 MG capsule Take by mouth.     levothyroxine (SYNTHROID) 50 MCG tablet Take 50 mcg by mouth daily before breakfast.     memantine (NAMENDA XR) 28 MG CP24 24 hr capsule Take 1 capsule (28 mg total) by mouth daily. 90 capsule 3   mirtazapine (REMERON) 30 MG tablet TAKE 1 TABLET BY MOUTH AT BEDTIME. (Patient taking differently: Take 30 mg by mouth at bedtime.) 90 tablet 3   Multiple Vitamins-Minerals (MULTIVITAMIN WITH MINERALS) tablet Take 1 tablet by mouth daily.     ondansetron (ZOFRAN) 4 MG tablet Take 1 tablet (4 mg total) by mouth every 8 (eight) hours as needed for nausea or vomiting. 20 tablet 0   oxyCODONE 10 MG TABS Take 1 tablet  (10 mg total) by mouth every 3 (three) hours as needed for severe pain ((score 7 to 10)). 30 tablet 0   pantoprazole (PROTONIX) 40 MG tablet Take 1 tablet by mouth daily.     rosuvastatin (CRESTOR) 10 MG tablet Take 10 mg by mouth daily.      thiamine 100 MG tablet Take 100 mg by mouth daily.     traMADol (ULTRAM) 50 MG tablet Take 50 mg by mouth every 6 (six) hours as needed for moderate pain.     XTAMPZA ER 18 MG C12A Take 1 capsule by mouth every 12 (twelve) hours.     influenza vaccine adjuvanted (FLUAD) 0.5 ML injection Inject into the muscle. 0.5 mL 0   No facility-administered medications prior to visit.     Social History   Socioeconomic History   Marital status: Married    Spouse name: Not on file   Number of children: 1   Years of education: 16   Highest education level: Master's degree (e.g.,  MA, MS, MEng, MEd, MSW, MBA)  Occupational History   Occupation: Retired  Tobacco Use   Smoking status: Former    Types: Cigarettes    Quit date: 1978    Years since quitting: 45.2   Smokeless tobacco: Never  Vaping Use   Vaping Use: Never used  Substance and Sexual Activity   Alcohol use: Yes    Comment: 8-10 ounces daily   Drug use: No   Sexual activity: Not on file  Other Topics Concern   Not on file  Social History Narrative   Lives at home with his wife.   Right-handed.   Caffeine- minimal use (4 ounces tea/soda per day)   Social Determinants of Health   Financial Resource Strain: Not on file  Food Insecurity: Not on file  Transportation Needs: Not on file  Physical Activity: Not on file  Stress: Not on file  Social Connections: Not on file  Intimate Partner Violence: Not on file      Review of Systems    All other ros negative  Objective:    BP 121/83    Pulse 82    Resp 16    Ht 5' 6"  (1.676 m)    Wt 149 lb (67.6 kg)    SpO2 96%    BMI 24.05 kg/m  Nursing note and vital signs reviewed.  Physical Exam     General/constitutional: no distress,  pleasant HEENT: Normocephalic, PER, Conj Clear, EOMI, Oropharynx clear Neck supple CV: rrr no mrg Lungs: clear to auscultation, normal respiratory effort Abd: Soft, Nontender Ext: no edema Skin: No Rash Neuro: nonfocal MSK: no peripheral joint swelling/tenderness/warmth; back spines nontender       Labs: Lab Results  Component Value Date   WBC 4.1 02/11/2022   HGB 12.6 (L) 02/11/2022   HCT 38.4 (L) 02/11/2022   MCV 89.5 02/11/2022   PLT 201 32/11/2481   Last metabolic panel Lab Results  Component Value Date   GLUCOSE 100 (H) 02/11/2022   NA 139 02/11/2022   K 4.3 02/11/2022   CL 104 02/11/2022   CO2 24 02/11/2022   BUN 47 (H) 02/11/2022   CREATININE 1.63 (H) 02/11/2022   EGFR 44 (L) 02/11/2022   GFRNONAA 45 (L) 08/29/2021   CALCIUM 9.9 02/11/2022   PROT 7.1 02/11/2022   ALBUMIN 4.1 04/23/2018   BILITOT 0.8 02/11/2022   ALKPHOS 48 04/23/2018   AST 147 (H) 02/11/2022   ALT 56 (H) 02/11/2022   ANIONGAP 7 08/29/2021    [facility]: 10/25 cbc 4/8.2/177; cr 0.98 10/18 cbc 6/8.6/251; cr 1.02 9/27 cbc 6/8/225 normal diff; cr 1.04 9/19 cbc 9/9/332; cr 1.02; lft 88/17/151/0.7  Crp: 10/25    16 (<10) 10/18    13 (<10) 9/29    106 (<10) 9/19      52 (<10)  Micro:  Serology:  Imaging:  Assessment & Plan:   Problem List Items Addressed This Visit       Other   Wound infection complicating hardware ([place])   Other Visit Diagnoses     Chronic osteomyelitis of lumbar spine ([place])    -  Primary       No orders of the defined types were placed in this encounter.   Abx: 10/26-c cefadroxil  9/15-10/26 cefazolin 9/17-10/04 rifampin 300 mg po bid   9/12-15 vanc 9/12-15 cefepime   ASSESSMENT: Surgical site infection/vertebral om lumbar Hardware complicating wound infection Rash suspect abx allergy to cefepime, less likely vanc   76 yo male hx  l4-5 fusion 2019 with recurrent back pain/neurogenic claudication, s/p on 8/31 L3-4 laminotomy/facetectomy  (complex surgical procedure due to prior lumbar intervody fusion), right L3-4 interbody fusion and zimmer DBM and insertion interbody prosthesis, posterior L3-5 instrumentation, however complicated most recently by early surgical site infection admitted 9/12 for I&D with retention hardware. Patient here for id clinic f/u   Reviewed 9/12 operative note. Hardware retained. Lot of pus in sq space; compromised lumbosacral fascia with pus tracking down to spine/hardware. Wound cx mssa   No sepsis this admission otherwise. No bcx obtained.   Given recent hardware placement <30 days, discharged on combination abx with rifampin  Developed rash on vanc/cefepime. Did ok on cefazolin though  ------- 10/4 id clinic f/u Fatigue/generalized weakness/poor apptite likely abx combination and ongoing pain/pain meds (taking extended oxycodone currently). Hct down and lft up considered rifampin side effect Also has a forehead lesion that is suggestive of shingle. ?cause for crp elevation Pain in lower back severe but stable without neurologic deficit. Incision intact. Patient to discuss pain management with nsg today  At this time, will stop rifampin and continue following labs Will see back in around 3 weeks for follow up   10/26 id clinic assessment Crp down Some intermittent serosanguinous stain at the incision but no dehiscence; slight erythema around it but ?related to tape/dressing  Fatigue/poor appetite likely multifactorial, low hemoglobin, infection, and ?as below suggested   New legs edema/ongoing fatigue query chf. No previous echo available; no hx chf. Needs chf evaluation. Cefazolin not typical to cause edema  11/18 id assessment Doing well today on oral cefadroxil. Sx from previous 2 clinic visits (fatigue/leg edema) no longer present. Not really sure what it was although temporally related to iv abx use and rifampin  Plan indefinite cefadroxil, maybe decreased dose in another 2-3 months  depending on clinical status/improvement  2/28 id assessment Gi sx / tarry stool ?PUD with slow upper gib? Diarrhea slowed down since off abx -- might not be related given duration of time he has been on abx. Doesn't seem to be cdiff Discuss importance of staying on abx due to metals/back infection Also considered dehydration related acute renal injury causing more nausea  3/09 id assessment Clinically improving with upper gi sx Not taking abx as prescribed - advise him this and the ?presumed acid blocker given by GI should be his top priority  -no need to repeat labs today given resolved black stool and clinically doing better -continue cefadroxil -zofran as needed for nausea; and whatever pills the gi clinic give -f/u 6-8 weeks  Follow-up: Return in about 6 weeks (around 04/03/2022).      Jabier Mutton, [facility] [place] [phone]  pager   [phone] cell 02/20/2022, 11:00 AM

## 2022-03-13 ENCOUNTER — Other Ambulatory Visit (HOSPITAL_BASED_OUTPATIENT_CLINIC_OR_DEPARTMENT_OTHER): Payer: Self-pay | Admitting: Clinical Cardiac Electrophysiology

## 2022-03-13 LAB — CARDIAC DEVICE CHECK - REMOTE
AT Burden Percent: 0
Atrial Pacing Percent: 95
Battery Remaining Longevity: 100
Battery Remaining Percentage: 82
Battery Voltage: 3.01
RA Impedance: 530
RA Intrinsic Amplitude: 5
RA Threshold Amplitude: 0.75
RV Impedance: 510
RV Intrinsic Amplitude: 6.3
RV Pacing Percent: 1

## 2022-03-19 DIAGNOSIS — H10502 Unspecified blepharoconjunctivitis, left eye: Secondary | ICD-10-CM | POA: Diagnosis not present

## 2022-03-19 DIAGNOSIS — H0100A Unspecified blepharitis right eye, upper and lower eyelids: Secondary | ICD-10-CM | POA: Diagnosis not present

## 2022-03-19 DIAGNOSIS — H2000 Unspecified acute and subacute iridocyclitis: Secondary | ICD-10-CM | POA: Diagnosis not present

## 2022-03-19 DIAGNOSIS — H02831 Dermatochalasis of right upper eyelid: Secondary | ICD-10-CM | POA: Diagnosis not present

## 2022-04-02 DIAGNOSIS — H01001 Unspecified blepharitis right upper eyelid: Secondary | ICD-10-CM | POA: Diagnosis not present

## 2022-04-02 DIAGNOSIS — H01004 Unspecified blepharitis left upper eyelid: Secondary | ICD-10-CM | POA: Diagnosis not present

## 2022-04-02 DIAGNOSIS — H16102 Unspecified superficial keratitis, left eye: Secondary | ICD-10-CM | POA: Diagnosis not present

## 2022-04-02 DIAGNOSIS — H1820 Unspecified corneal edema: Secondary | ICD-10-CM | POA: Diagnosis not present

## 2022-04-10 ENCOUNTER — Encounter: Payer: Self-pay | Admitting: Internal Medicine

## 2022-04-10 ENCOUNTER — Ambulatory Visit (INDEPENDENT_AMBULATORY_CARE_PROVIDER_SITE_OTHER): Payer: Medicare Other | Admitting: Internal Medicine

## 2022-04-10 ENCOUNTER — Other Ambulatory Visit: Payer: Self-pay

## 2022-04-10 VITALS — BP 163/96 | HR 82 | Temp 97.8°F | Ht 66.0 in | Wt 156.0 lb

## 2022-04-10 DIAGNOSIS — M869 Osteomyelitis, unspecified: Secondary | ICD-10-CM

## 2022-04-10 DIAGNOSIS — T847XXD Infection and inflammatory reaction due to other internal orthopedic prosthetic devices, implants and grafts, subsequent encounter: Secondary | ICD-10-CM

## 2022-04-10 MED ORDER — CEFADROXIL 500 MG PO CAPS: 500.0000 mg | ORAL_CAPSULE | Freq: Two times a day (BID) | ORAL | 3 refills | Status: DC

## 2022-04-10 NOTE — Addendum Note (Signed)
Addended byPrudencio Pair T on: 04/10/2022 12:20 PM ? ? Modules accepted: Orders ? ?

## 2022-04-10 NOTE — Progress Notes (Signed)
?  ? ? ? ? ?[facility] ? ?Patient Active Problem List  ? Diagnosis Date Noted  ? Wound infection complicating hardware ([place]) 08/26/2021  ? Postoperative complication of skin involving drainage from surgical wound 08/26/2021  ? Lumbar adjacent segment disease with spondylolisthesis 08/14/2021  ? Insomnia 04/19/2020  ? Dislocated intraocular lens 05/18/2018  ? Dislocated intraocular lens, sequela 05/05/2018  ? Spondylolisthesis of lumbar region 05/05/2018  ? Memory difficulties 04/08/2018  ? Gait abnormality 04/08/2018  ? Alcohol withdrawal ([place]) 02/28/2018  ? Weakness 02/26/2018  ? Essential hypertension 02/26/2018  ? HLD (hyperlipidemia) 02/26/2018  ? Tremors of nervous system 02/26/2018  ? Alcohol abuse 02/26/2018  ? Weakness generalized 02/26/2018  ? ? ? ? ?Subjective:  ? ? Patient ID: Brandon [facility], male    DOB: 22-Sep-1946, 76 y.o.   MRN: [id number] ? ?Chief Complaint  ?Patient presents with  ? Follow-up  ? ? ?Cc -- f/u lumbar spine om complicated by hardware presence ? ?HPI: ? ?Brandon Fleming is a 76 y.o. male hx l4-5 fusion 2019 with recurrent back pain/neurogenic claudication, s/p on 8/31 L3-4 laminotomy/facetectomy (complex surgical procedure due to prior lumbar interbody fusion), right L3-4 interbody fusion and zimmer DBM and insertion interbody prosthesis, posterior L3-5 instrumentation, however complicated most recently by early surgical site infection admitted 9/12 for I&D with retention hardware ? ? ?I saw paitent during admission 08/26/21.  ?Reviewed 9/12 operative note. Lot of pus in sq space; compromised lumbosacral fascia with pus tracking down to spine/hardware. Wound cx mssa. Hardware left in place. No blood cx obtained ?He had a rash while on cefepime/vanc, but tolerated cefazolin ?Discharged on rifampin and cefazolin ?Plan 6 weeks cefazolin until 10/24 then to transition to cefadroxil for chronic suppression along with 3 months rifampin ? ? ?------ ?10/04 id clinic  f/u ?Patient had 2 weeks burning 4 lesions on his forehead; was skin color then turned erythematous. No vessicles. ?Also decreased appetite, generalized weakness since discharge, worse ?Pain same in the back; severe. Difficulty getting around. He gets around though with FWW ?No LE neurological deficit or urinary bowel incontinence ?No n/v/diarrhea ?No joint pain muscle ache otherwise ?Feels cold all the time ?No fever ?No sorethroat/cough ?No sick contact ? ?[facility] labs reviewed ?Crp up the past week ?Lft up  ?Hct down ? ?10/26 id f/u ?Shingle on forehead had resolved ?Still with poor appetite, malaise ?New legs edema -- concerned if cefazolin can do that ?There is occasional serosanguinous stain at incision. ?Pain stable mild-moderate. Patient ambulatory ?No fever, chill ?No diarrhea, n/v ? ?Labs not available ? ?11/18 id f/u ?Doing really well ?Wound all closed  ?Tolerate oral cefadroxil which was started 10/26... no further fatigue ?No gi sx ? ?02/11/22 id f/u ?Hasn't taken abx for the last 8 days as he wasn't feeling well. ?Started out as nausea/epigastric discomfort, then also loose stool to diarrhea progressing to tarry black stool ?Has not eating much if at all and there are associated fatigue, wobbly gait. ?No fever, chill ?No sick contact ?The stool diarrhea frequency has decreased although he questions if it is due to decreased intake. Stool is soft and more formed ?Has hx of esophageal dilatation due to stricture. This was last done early spring 2022 ?Hasn't taken any prednisone or nsaids leading up this  ? ?No new back pain ? ? ?3/09 id f/u ?Reviewed labs from 2/28 -- mild aki ?He was seen by GI right a way and patient's report there was  lots of inflammation. Given some kind of pills to take twice a day ? ?He said zofran helping as well with nausea ?Eating a little better ?Overall feeling better with strength as well ? ?No more black stool ? ?Back on antibiotics but most days can only do once a  day ? ? ?4/27 id f/u ?Patient doing very well. Not having much nausea ?Still occasional fall due to bourbon drinking ?No f/c ?Eating at least 1 meal a day ?No weight loss ? ? ? ? ? ?Allergies  ?Allergen Reactions  ? Aricept [Donepezil Hcl] Diarrhea and Other (See Comments)  ?  Diarrhea, stomach problems.   ? Atropine Swelling and Other (See Comments)  ?  Eyes swollen and red  ? Other Other (See Comments)  ?  Moisturizing eye drop - unknown  ? ? ? ? ?Outpatient Medications Prior to Visit  ?Medication Sig Dispense Refill  ? amLODipine-valsartan (EXFORGE) 10-160 MG tablet Take 1 tablet by mouth daily.    ? cefadroxil (DURICEF) 1 g tablet Take 1 tablet (1 g total) by mouth 2 (two) times daily. 60 tablet 11  ? diphenhydramine-acetaminophen (TYLENOL PM) 25-500 MG TABS tablet Take 2 tablets by mouth at bedtime as needed (sleep).    ? docusate sodium (COLACE) 100 MG capsule Take 1 capsule (100 mg total) by mouth 2 (two) times daily. 10 capsule 0  ? fenofibrate (TRICOR) 145 MG tablet Take 1 tablet by mouth daily.    ? FLUoxetine (PROZAC) 20 MG capsule TAKE 1 CAPSULE BY MOUTH EVERY DAY (Patient taking differently: Take 20 mg by mouth daily.) 90 capsule 3  ? folic acid (FOLVITE) 1 MG tablet Take 1 mg by mouth daily.    ? gabapentin (NEURONTIN) 300 MG capsule Take by mouth.    ? levothyroxine (SYNTHROID) 50 MCG tablet Take 50 mcg by mouth daily before breakfast.    ? memantine (NAMENDA XR) 28 MG CP24 24 hr capsule Take 1 capsule (28 mg total) by mouth daily. 90 capsule 3  ? mirtazapine (REMERON) 30 MG tablet TAKE 1 TABLET BY MOUTH AT BEDTIME. (Patient taking differently: Take 30 mg by mouth at bedtime.) 90 tablet 3  ? Multiple Vitamins-Minerals (MULTIVITAMIN WITH MINERALS) tablet Take 1 tablet by mouth daily.    ? neomycin-polymyxin b-dexamethasone (MAXITROL) 3.5-10000-0.1 SUSP Place into the left eye.    ? ondansetron (ZOFRAN) 4 MG tablet Take 1 tablet (4 mg total) by mouth every 8 (eight) hours as needed for nausea or  vomiting. 20 tablet 0  ? oxyCODONE 10 MG TABS Take 1 tablet (10 mg total) by mouth every 3 (three) hours as needed for severe pain ((score 7 to 10)). 30 tablet 0  ? pantoprazole (PROTONIX) 40 MG tablet Take 1 tablet by mouth 2 (two) times daily.    ? rosuvastatin (CRESTOR) 10 MG tablet Take 10 mg by mouth daily.     ? sucralfate (CARAFATE) 1 g tablet Take 1 g by mouth 3 (three) times daily.    ? thiamine 100 MG tablet Take 100 mg by mouth daily.    ? traMADol (ULTRAM) 50 MG tablet Take 50 mg by mouth every 6 (six) hours as needed for moderate pain.    ? XTAMPZA ER 18 MG C12A Take 1 capsule by mouth every 12 (twelve) hours.    ? cyclobenzaprine (FLEXERIL) 10 MG tablet Take 1 tablet (10 mg total) by mouth 3 (three) times daily as needed for muscle spasms. (Patient not taking: Reported on 02/20/2022) 30 tablet 0  ? ?  No facility-administered medications prior to visit.  ? ? ? ?Social History  ? ?Socioeconomic History  ? Marital status: Married  ?  Spouse name: Not on file  ? Number of children: 1  ? Years of education: 50  ? Highest education level: Master's degree (e.g., MA, MS, MEng, MEd, MSW, MBA)  ?Occupational History  ? Occupation: Retired  ?Tobacco Use  ? Smoking status: Former  ?  Types: Cigarettes  ?  Quit date: 81  ?  Years since quitting: 45.3  ? Smokeless tobacco: Never  ?Vaping Use  ? Vaping Use: Never used  ?Substance and Sexual Activity  ? Alcohol use: Yes  ?  Alcohol/week: 14.0 standard drinks  ?  Types: 14 Standard drinks or equivalent per week  ?  Comment: 8-10 ounces daily  ? Drug use: No  ? Sexual activity: Not on file  ?Other Topics Concern  ? Not on file  ?Social History Narrative  ? Lives at home with his wife.  ? Right-handed.  ? Caffeine- minimal use (4 ounces tea/soda per day)  ? ?Social Determinants of Health  ? ?Financial Resource Strain: Not on file  ?Food Insecurity: Not on file  ?Transportation Needs: Not on file  ?Physical Activity: Not on file  ?Stress: Not on file  ?Social Connections:  Not on file  ?Intimate Partner Violence: Not on file  ? ? ? ? ?Review of Systems ?   ?All other ros negative ? ?Objective:  ?  ?BP (!) 163/96   Pulse 82   Temp 97.8 ?F (36.6 ?C) (Oral)   Ht '5\' 6"'$  (1.676 m)   Wt 1

## 2022-04-10 NOTE — Patient Instructions (Signed)
Decrease cefadroxil to 500 mg twice daily; new rx placed ? ?Labs today ? ?See me in 8 weeks ?

## 2022-04-11 ENCOUNTER — Encounter: Payer: Self-pay | Admitting: Internal Medicine

## 2022-04-11 LAB — CBC
HCT: 36 % — ABNORMAL LOW (ref 38.5–50.0)
Hemoglobin: 12.1 g/dL — ABNORMAL LOW (ref 13.2–17.1)
MCH: 31.3 pg (ref 27.0–33.0)
MCHC: 33.6 g/dL (ref 32.0–36.0)
MCV: 93.3 fL (ref 80.0–100.0)
MPV: 11.9 fL (ref 7.5–12.5)
Platelets: 199 10*3/uL (ref 140–400)
RBC: 3.86 10*6/uL — ABNORMAL LOW (ref 4.20–5.80)
RDW: 14.8 % (ref 11.0–15.0)
WBC: 3.5 10*3/uL — ABNORMAL LOW (ref 3.8–10.8)

## 2022-04-11 LAB — C-REACTIVE PROTEIN: CRP: 2.6 mg/L

## 2022-04-11 LAB — COMPLETE METABOLIC PANEL WITHOUT GFR
AG Ratio: 1.4 (calc) (ref 1.0–2.5)
ALT: 20 U/L (ref 9–46)
AST: 52 U/L — ABNORMAL HIGH (ref 10–35)
Albumin: 4 g/dL (ref 3.6–5.1)
Alkaline phosphatase (APISO): 85 U/L (ref 35–144)
BUN/Creatinine Ratio: 24 (calc) — ABNORMAL HIGH (ref 6–22)
BUN: 31 mg/dL — ABNORMAL HIGH (ref 7–25)
CO2: 24 mmol/L (ref 20–32)
Calcium: 9.9 mg/dL (ref 8.6–10.3)
Chloride: 108 mmol/L (ref 98–110)
Creat: 1.3 mg/dL — ABNORMAL HIGH (ref 0.70–1.28)
Globulin: 2.9 g/dL (ref 1.9–3.7)
Glucose, Bld: 94 mg/dL (ref 65–99)
Potassium: 5.3 mmol/L (ref 3.5–5.3)
Sodium: 140 mmol/L (ref 135–146)
Total Bilirubin: 0.6 mg/dL (ref 0.2–1.2)
Total Protein: 6.9 g/dL (ref 6.1–8.1)
eGFR: 58 mL/min/1.73m2 — ABNORMAL LOW

## 2022-04-15 ENCOUNTER — Encounter (HOSPITAL_BASED_OUTPATIENT_CLINIC_OR_DEPARTMENT_OTHER): Payer: Self-pay

## 2022-04-16 DIAGNOSIS — H1820 Unspecified corneal edema: Secondary | ICD-10-CM | POA: Diagnosis not present

## 2022-04-16 DIAGNOSIS — H10503 Unspecified blepharoconjunctivitis, bilateral: Secondary | ICD-10-CM | POA: Diagnosis not present

## 2022-04-16 DIAGNOSIS — H01001 Unspecified blepharitis right upper eyelid: Secondary | ICD-10-CM | POA: Diagnosis not present

## 2022-04-16 DIAGNOSIS — H02831 Dermatochalasis of right upper eyelid: Secondary | ICD-10-CM | POA: Diagnosis not present

## 2022-04-23 NOTE — Progress Notes (Addendum)
PATIENT: Brandon Fleming DOB: 05/28/1946  REASON FOR VISIT: follow up for memory HISTORY FROM: patient, wife PRIMARY NEUROLOGIST: Dr. Julius Bowels. [place]   HISTORY OF PRESENT ILLNESS: Today 04/24/22 Brandon Fleming is here today for follow-up.  He refused memory testing. Weight is up 2 lbs. Had surgery with Dr. Arnoldo Morale 08/14/21 L3-4 with hardware revision, got staph infection, required prolonged IV antibiotics. Working with ID. Dealing with pain, balances worsens in afternoon, uses walker. Is still driving. Doing better to take his medications, from [place]: Remeron, Prozac, Namenda. Still asking repetitive questioning, but memory is stable. Has hearing aids, doesn't wear them. Still going to the beach to visit grand kids. Isn't sleeping at night due to pain, does drink bourbon every night.   Update 04/23/2021 SS: Brandon Fleming is a 76 year old male with history of alcohol overuse, mild memory disturbance, gait disturbance.  Is on Namenda XR, Prozac, Remeron from this office.  Had side effect to Aricept. MMSE 28/30 today. Still dealing with left hip and low back pain, seeing Dr. Durward Fortes, hoping for cortisone shot today in hip. Needs back surgery doesn't want it, seeing Dr. Arnoldo Morale, takting Tramadol. Going to see grand kids in [place], twice monthly helps anxiety and depression.  He is driving restrictions have been lifted.  Has trouble with short-term memory, makes a lot of notes.  Is not especially active, prefers to stay at home, worries about mobility issues, afraid of falling, refuses to use cane.  His wife remains quite social.  No longer rides a stationary bike.  He had his esophagus stretched at [facility], dysphagia has improved.  His medications from this office are working, there are times he does not want to take them.  Weight is down 5 pounds since last seen.  He does eat a good dinner when he takes the mirtazapine.  Here today for evaluation accompanied by his wife.  Update 10/24/2020 SS: Mr.  Brandon Fleming is a 76 year old male with history of alcohol overuse, mild memory disturbance, gait disturbance.  Could not tolerate Aricept secondary to diarrhea.  He is on Namenda XR, Prozac, and Remeron.  Has chronic back and leg pain, sees Dr. Arnoldo Morale, ESI was too painful to tolerate. Weight about the same, down 3 lbs from May. New issue in last 6 weeks, dysphagia, nausea, seeing Dr. Paulita Fujita at [facility], had endoscopy, concerning area, waiting for pathology.  Likely needs back surgery, legs ache all night, but figuring out GI issue first.  His mood and depression does well when he takes medications.  Sometimes at night, just will not take them when his wife gives them.  Continues with a few bourbon beverages daily.  No longer riding his stationary bike, causing back pain.  Going back and forth to [place], where their only daughter lives, have 2 grand children (2,5), goes [employer] games.  Drives a car m-f 8-6 court ordered from prior DWI.  Memory is stable, trouble with short-term memory, asked his wife repetitive questions, " what are we doing today?".  No falls.  Presents today for evaluation accompanied by his wife.  HISTORY  04/19/2020 SS: Mr. Brandon Fleming is a 76 year old male with history of alcohol overuse and mild memory disturbance and gait disturbance.  He was unable to tolerate Aricept secondary to diarrhea.  He remains on Namenda.  He is taking Remeron for sleep.  He does not eat much during the day, but has about 3-4 bourbon beverages a day, he eats a good dinner, likes ice cream  before bed.  Memory is overall stable, has trouble with short-term, will ask his wife repetitive questions (what are we doing today?).  Long-term memory is good.  He has chronic back and leg pain, didn't have the planned injection because procedure was too painful.  The medications seem to agree with him, as long as he takes them.  His wife reminds him.  If he does not take the Remeron, he may be frustrated, up and down all  night.  He has not had any falls, has had a few stumbles.  He drives a car without difficulty and does his own ADLs.  He uses a recumbent bike about 45 minutes a day in the morning, while watching the news.  He is back and forth to [place] on the weekends, where his daughter lives.  He presents today for evaluation accompanied by his wife  REVIEW OF SYSTEMS: Out of a complete 14 system review of symptoms, the patient complains only of the following symptoms, and all other reviewed systems are negative.  See HPI  ALLERGIES: Allergies  Allergen Reactions   Aricept [Donepezil Hcl] Diarrhea and Other (See Comments)    Diarrhea, stomach problems.    Atropine Swelling and Other (See Comments)    Eyes swollen and red   Other Other (See Comments)    Moisturizing eye drop - unknown    HOME MEDICATIONS: Outpatient Medications Prior to Visit  Medication Sig Dispense Refill   amLODipine-valsartan (EXFORGE) 10-160 MG tablet Take 1 tablet by mouth daily.     cefadroxil (DURICEF) 500 MG capsule Take 1 capsule (500 mg total) by mouth 2 (two) times daily. 180 capsule 3   cyclobenzaprine (FLEXERIL) 10 MG tablet Take 1 tablet (10 mg total) by mouth 3 (three) times daily as needed for muscle spasms. 30 tablet 0   diphenhydramine-acetaminophen (TYLENOL PM) 25-500 MG TABS tablet Take 2 tablets by mouth at bedtime as needed (sleep).     docusate sodium (COLACE) 100 MG capsule Take 1 capsule (100 mg total) by mouth 2 (two) times daily. 10 capsule 0   fenofibrate (TRICOR) 145 MG tablet Take 1 tablet by mouth daily.     folic acid (FOLVITE) 1 MG tablet Take 1 mg by mouth daily.     gabapentin (NEURONTIN) 300 MG capsule Take by mouth.     levothyroxine (SYNTHROID) 50 MCG tablet Take 50 mcg by mouth daily before breakfast.     Multiple Vitamins-Minerals (MULTIVITAMIN WITH MINERALS) tablet Take 1 tablet by mouth daily.     neomycin-polymyxin b-dexamethasone (MAXITROL) 3.5-10000-0.1 SUSP Place into the left  eye.     ondansetron (ZOFRAN) 4 MG tablet Take 1 tablet (4 mg total) by mouth every 8 (eight) hours as needed for nausea or vomiting. 20 tablet 0   oxyCODONE 10 MG TABS Take 1 tablet (10 mg total) by mouth every 3 (three) hours as needed for severe pain ((score 7 to 10)). 30 tablet 0   pantoprazole (PROTONIX) 40 MG tablet Take 1 tablet by mouth 2 (two) times daily.     rosuvastatin (CRESTOR) 10 MG tablet Take 10 mg by mouth daily.      sucralfate (CARAFATE) 1 g tablet Take 1 g by mouth 3 (three) times daily.     thiamine 100 MG tablet Take 100 mg by mouth daily.     traMADol (ULTRAM) 50 MG tablet Take 50 mg by mouth every 6 (six) hours as needed for moderate pain.     XTAMPZA ER 18 MG  C12A Take 1 capsule by mouth every 12 (twelve) hours.     FLUoxetine (PROZAC) 20 MG capsule TAKE 1 CAPSULE BY MOUTH EVERY DAY (Patient taking differently: Take 20 mg by mouth daily.) 90 capsule 3   memantine (NAMENDA XR) 28 MG CP24 24 hr capsule Take 1 capsule (28 mg total) by mouth daily. 90 capsule 3   mirtazapine (REMERON) 30 MG tablet TAKE 1 TABLET BY MOUTH AT BEDTIME. (Patient taking differently: Take 30 mg by mouth daily.) 90 tablet 3   No facility-administered medications prior to visit.    PAST MEDICAL HISTORY: Past Medical History:  Diagnosis Date   Alcohol abuse    Anxiety    Depression    Gait abnormality 04/08/2018   GERD (gastroesophageal reflux disease)    Hyperlipidemia    Hypertension    Kidney damage    Memory difficulties 04/08/2018    PAST SURGICAL HISTORY: Past Surgical History:  Procedure Laterality Date   BACK SURGERY     COLONOSCOPY     no polyps   COLONOSCOPY W/ POLYPECTOMY     EYE SURGERY     KNEE SURGERY Left 2011   fracture   LASER PHOTO ABLATION Left 05/18/2018   Procedure: LASER PHOTO ABLATION;  Surgeon: Hayden Pedro, MD;  Location: [facility];  Service: Ophthalmology;  Laterality: Left;   LUMBAR WOUND DEBRIDEMENT N/A 08/26/2021   Procedure: LUMBAR WOUND DEBRIDEMENT;   Surgeon: Newman Pies, MD;  Location: [facility];  Service: Neurosurgery;  Laterality: N/A;   PARS PLANA VITRECTOMY Left 05/18/2018   Procedure: PARS PLANA VITRECTOMY WITH 25G REMOVAL/SUTURE INTRAOCULAR LENS;  Surgeon: Hayden Pedro, MD;  Location: [facility];  Service: Ophthalmology;  Laterality: Left;   VITRECTOMY Left 05/18/2018   PARS PLANA VITRECTOMY WITH 25G REMOVAL/SUTURE INTRAOCULAR LENS (Left)    FAMILY HISTORY: Family History  Problem Relation Age of Onset   CAD Father    Kidney disease Father    Parkinson's disease Mother    Anesthesia problems Neg Hx    Broken bones Neg Hx    Cancer Neg Hx    Clotting disorder Neg Hx    Collagen disease Neg Hx    Diabetes Neg Hx    Dislocations Neg Hx    Osteoporosis Neg Hx    Rheumatologic disease Neg Hx    Scoliosis Neg Hx    Severe sprains Neg Hx     SOCIAL HISTORY: Social History   Socioeconomic History   Marital status: Married    Spouse name: Not on file   Number of children: 1   Years of education: 16   Highest education level: Master's degree (e.g., MA, MS, MEng, MEd, MSW, MBA)  Occupational History   Occupation: Retired  Tobacco Use   Smoking status: Former    Types: Cigarettes    Quit date: 1978    Years since quitting: 45.3   Smokeless tobacco: Never  Vaping Use   Vaping Use: Never used  Substance and Sexual Activity   Alcohol use: Yes    Alcohol/week: 14.0 standard drinks    Types: 14 Standard drinks or equivalent per week    Comment: 8-10 ounces daily   Drug use: No   Sexual activity: Not on file  Other Topics Concern   Not on file  Social History Narrative   Lives at home with his wife.   Right-handed.   Caffeine- minimal use (4 ounces tea/soda per day)   Social Determinants of Health   Financial Resource Strain: Not  on file  Food Insecurity: Not on file  Transportation Needs: Not on file  Physical Activity: Not on file  Stress: Not on file  Social Connections: Not on file  Intimate Partner  Violence: Not on file   PHYSICAL EXAM  Vitals:   04/24/22 0840  BP: 130/80  Pulse: 80  Weight: 160 lb (72.6 kg)  Height: '5\' 6"'$  (1.676 m)    Body mass index is 25.82 kg/m.  Generalized: Well developed, in no acute distress     04/24/2022    8:43 AM 04/23/2021    8:47 AM 10/24/2020    8:41 AM  MMSE - Mini Mental State Exam  Not completed: Refused    Orientation to time  5 5  Orientation to Place  5 5  Registration  3 3  Attention/ Calculation  5 4  Recall  3 3  Language- name 2 objects  2 2  Language- repeat  1 1  Language- follow 3 step command  1 3  Language- read & follow direction  1 1  Write a sentence  1 1  Copy design  1 1  Total score  28 29    Neurological examination  Mentation: Alert oriented to time, place, history taking. Follows all commands speech and language fluent Cranial nerve II-XII: Pupils were equal round reactive to light. Extraocular movements were full, visual field were full on confrontational test. Facial sensation and strength were normal. Head turning and shoulder shrug  were normal and symmetric. Motor: The motor testing reveals 5 over 5 strength of all 4 extremities. Good symmetric motor tone is noted throughout.  Sensory: Sensory testing is intact to soft touch on all 4 extremities. No evidence of extinction is noted.  Coordination: Cerebellar testing reveals good finger-nose-finger and heel-to-shin bilaterally.  Gait and station: Gait is slightly wide-based, steady Reflexes: Deep tendon reflexes are symmetric and normal bilaterally.   DIAGNOSTIC DATA (LABS, IMAGING, TESTING) - I reviewed patient records, labs, notes, testing and imaging myself where available.  Lab Results  Component Value Date   WBC 3.5 (L) 04/10/2022   HGB 12.1 (L) 04/10/2022   HCT 36.0 (L) 04/10/2022   MCV 93.3 04/10/2022   PLT 199 04/10/2022      Component Value Date/Time   NA 140 04/10/2022 1221   K 5.3 04/10/2022 1221   CL 108 04/10/2022 1221   CO2 24  04/10/2022 1221   GLUCOSE 94 04/10/2022 1221   BUN 31 (H) 04/10/2022 1221   CREATININE 1.30 (H) 04/10/2022 1221   CALCIUM 9.9 04/10/2022 1221   PROT 6.9 04/10/2022 1221   ALBUMIN 4.1 04/23/2018 1117   AST 52 (H) 04/10/2022 1221   ALT 20 04/10/2022 1221   ALKPHOS 48 04/23/2018 1117   BILITOT 0.6 04/10/2022 1221   GFRNONAA 45 (L) 08/29/2021 0501   GFRAA >60 05/06/2018 0624   No results found for: CHOL, HDL, LDLCALC, LDLDIRECT, TRIG, CHOLHDL Lab Results  Component Value Date   HGBA1C 4.8 08/27/2021   Lab Results  Component Value Date   VITAMINB12 335 02/26/2018   Lab Results  Component Value Date   TSH 4.465 02/26/2018   ASSESSMENT AND PLAN 76 y.o. year old male  has a past medical history of Alcohol abuse, Anxiety, Depression, Gait abnormality (04/08/2018), GERD (gastroesophageal reflux disease), Hyperlipidemia, Hypertension, Kidney damage, and Memory difficulties (04/08/2018). here with:  1.  Mild memory disturbance 2.  Gait disturbance 3.  History of alcohol overuse 4.  Fatigue, insomnia  -Since last seen,  had lumbar surgery, complicated by staph infection, with prolonged IV antibiotics, close follow-up with ID; is discouraged about this, having trouble sleeping, chronic pain -Refused memory testing, but seems at baseline, will continue Namenda, could not tolerate Aricept in the past, MMSE was 28/30 last year -Discussed referral to psychiatry or counseling, he refuses at this point, will continue Prozac -Recommend moving Remeron 30 mg to evening time to hopefully help with sleep, have discussed not a good idea to mix medications with alcohol; we might consider trying to use 45 mg at bedtime to help with mood, appetite, sleep -Encouraged to increase activity as tolerated, being with his grandkids makes him happy, prefers 1 year follow-up, asked to call for any changes, keep close follow-up with PCP, I think we can consider transition back to PCP in future  Addendum 10/16/2022  SS: I received office note from Dr. Arnoldo Morale with neurosurgery had lumbar fusion L2-3 on September 22, 2022.  Feeling much better.  Doing well 3 weeks postop.  Gabapentin was increased to 300 mg twice a day for restless leg, discuss Requip with [place]. His next appointment here for his memory is May 2024.  Butler Denmark, AGNP-C, DNP 04/24/2022, 9:17 AM [facility] [address] [place], [place] [address] [phone]

## 2022-04-24 ENCOUNTER — Encounter: Payer: Self-pay | Admitting: Neurology

## 2022-04-24 ENCOUNTER — Ambulatory Visit (INDEPENDENT_AMBULATORY_CARE_PROVIDER_SITE_OTHER): Payer: Medicare Other | Admitting: Neurology

## 2022-04-24 VITALS — BP 130/80 | HR 80 | Ht 66.0 in | Wt 160.0 lb

## 2022-04-24 DIAGNOSIS — G4700 Insomnia, unspecified: Secondary | ICD-10-CM | POA: Diagnosis not present

## 2022-04-24 DIAGNOSIS — R413 Other amnesia: Secondary | ICD-10-CM | POA: Diagnosis not present

## 2022-04-24 DIAGNOSIS — R269 Unspecified abnormalities of gait and mobility: Secondary | ICD-10-CM

## 2022-04-24 MED ORDER — MIRTAZAPINE 30 MG PO TABS: 30.0000 mg | ORAL_TABLET | Freq: Every day | ORAL | 1 refills | Status: DC

## 2022-04-24 MED ORDER — FLUOXETINE HCL 20 MG PO CAPS: 20.0000 mg | ORAL_CAPSULE | Freq: Every day | ORAL | 5 refills | Status: DC

## 2022-04-24 MED ORDER — MEMANTINE HCL ER 28 MG PO CP24: 28.0000 mg | ORAL_CAPSULE | Freq: Every day | ORAL | 3 refills | Status: DC

## 2022-04-24 NOTE — Patient Instructions (Signed)
Try taking the Remeron at bedtime  ?Keep other medications ?We could increase the Remeron, let me know if needed ?Will schedule 1 year follow-up, can be seen earlier if needed ? ?

## 2022-05-18 ENCOUNTER — Other Ambulatory Visit: Payer: Self-pay | Admitting: Neurology

## 2022-05-19 NOTE — Telephone Encounter (Signed)
Rx refilled for 90 day supply. 

## 2022-06-09 ENCOUNTER — Ambulatory Visit: Payer: Medicare Other | Attending: Internal Medicine

## 2022-06-09 DIAGNOSIS — Z23 Encounter for immunization: Secondary | ICD-10-CM

## 2022-06-10 ENCOUNTER — Other Ambulatory Visit (HOSPITAL_BASED_OUTPATIENT_CLINIC_OR_DEPARTMENT_OTHER): Payer: Self-pay

## 2022-06-10 MED ORDER — PFIZER COVID-19 VAC BIVALENT 30 MCG/0.3ML IM SUSP
INTRAMUSCULAR | 0 refills | Status: DC
  Filled 2022-06-10: qty 0.3, 1d supply, fill #0

## 2022-06-11 ENCOUNTER — Other Ambulatory Visit (HOSPITAL_BASED_OUTPATIENT_CLINIC_OR_DEPARTMENT_OTHER): Payer: Self-pay | Admitting: Student in an Organized Health Care Education/Training Program

## 2022-06-15 LAB — CARDIAC DEVICE CHECK - REMOTE
AT Burden Percent: 1
Atrial Pacing Percent: 95
Battery Remaining Longevity: 96
Battery Remaining Percentage: 80
Battery Voltage: 3.01
RA Impedance: 480
RA Intrinsic Amplitude: 5
RA Threshold Amplitude: 0.75
RV Impedance: 510
RV Intrinsic Amplitude: 4.7
RV Pacing Percent: 1

## 2022-06-18 DIAGNOSIS — K2980 Duodenitis without bleeding: Secondary | ICD-10-CM | POA: Diagnosis not present

## 2022-06-18 DIAGNOSIS — K269 Duodenal ulcer, unspecified as acute or chronic, without hemorrhage or perforation: Secondary | ICD-10-CM | POA: Diagnosis not present

## 2022-06-18 DIAGNOSIS — K2210 Ulcer of esophagus without bleeding: Secondary | ICD-10-CM | POA: Diagnosis not present

## 2022-06-19 ENCOUNTER — Encounter: Payer: Self-pay | Admitting: Internal Medicine

## 2022-06-19 ENCOUNTER — Other Ambulatory Visit: Payer: Self-pay

## 2022-06-19 ENCOUNTER — Ambulatory Visit (INDEPENDENT_AMBULATORY_CARE_PROVIDER_SITE_OTHER): Payer: Medicare Other | Admitting: Internal Medicine

## 2022-06-19 VITALS — BP 133/87 | HR 80 | Resp 16 | Ht 66.0 in | Wt 158.8 lb

## 2022-06-19 DIAGNOSIS — M4620 Osteomyelitis of vertebra, site unspecified: Secondary | ICD-10-CM | POA: Diagnosis not present

## 2022-06-19 DIAGNOSIS — T847XXD Infection and inflammatory reaction due to other internal orthopedic prosthetic devices, implants and grafts, subsequent encounter: Secondary | ICD-10-CM

## 2022-06-19 DIAGNOSIS — M545 Low back pain, unspecified: Secondary | ICD-10-CM

## 2022-06-19 MED ORDER — ZIKS ARTHRITIS PAIN RELIEF 0.025-1-12 % EX CREA: 1.0000 | TOPICAL_CREAM | Freq: Three times a day (TID) | CUTANEOUS | 3 refills | Status: DC | PRN

## 2022-06-19 NOTE — Patient Instructions (Signed)
Talk to neurology about benadryl and other pain mediction use (I suggest gabapentin for nerve pain)  Will give you topical capsaicin cream to use as needed for now for severe pain  See me in 6 months

## 2022-06-19 NOTE — Progress Notes (Signed)
[facility]  Patient Active Problem List   Diagnosis Date Noted   Wound infection complicating hardware ([place]) 08/26/2021   Postoperative complication of skin involving drainage from surgical wound 08/26/2021   Lumbar adjacent segment disease with spondylolisthesis 08/14/2021   Insomnia 04/19/2020   Dislocated intraocular lens 05/18/2018   Dislocated intraocular lens, sequela 05/05/2018   Spondylolisthesis of lumbar region 05/05/2018   Memory difficulties 04/08/2018   Gait abnormality 04/08/2018   Alcohol withdrawal ([place]) 02/28/2018   Weakness 02/26/2018   Essential hypertension 02/26/2018   HLD (hyperlipidemia) 02/26/2018   Tremors of nervous system 02/26/2018   Alcohol abuse 02/26/2018   Weakness generalized 02/26/2018      Subjective:    Patient ID: Brandon Fleming, male    DOB: 1946-09-24, 76 y.o.   MRN: [id number]  No chief complaint on file.   Cc -- f/u lumbar spine om complicated by hardware presence  HPI:  Brandon Fleming is a 76 y.o. male hx l4-5 fusion 2019 with recurrent back pain/neurogenic claudication, s/p on 8/31 L3-4 laminotomy/facetectomy (complex surgical procedure due to prior lumbar interbody fusion), right L3-4 interbody fusion and zimmer DBM and insertion interbody prosthesis, posterior L3-5 instrumentation, however complicated most recently by early surgical site infection admitted 9/12 for I&D with retention hardware   I saw paitent during admission 08/26/21.  Reviewed 9/12 operative note. Lot of pus in sq space; compromised lumbosacral fascia with pus tracking down to spine/hardware. Wound cx mssa. Hardware left in place. No blood cx obtained He had a rash while on cefepime/vanc, but tolerated cefazolin Discharged on rifampin and cefazolin Plan 6 weeks cefazolin until 10/24 then to transition to cefadroxil for chronic suppression along with 3 months rifampin   ------ 10/04 id clinic f/u Patient had 2 weeks  burning 4 lesions on his forehead; was skin color then turned erythematous. No vessicles. Also decreased appetite, generalized weakness since discharge, worse Pain same in the back; severe. Difficulty getting around. He gets around though with FWW No LE neurological deficit or urinary bowel incontinence No n/v/diarrhea No joint pain muscle ache otherwise Feels cold all the time No fever No sorethroat/cough No sick contact  [facility] labs reviewed Crp up the past week Lft up  Hct down  10/26 id f/u Shingle on forehead had resolved Still with poor appetite, malaise New legs edema -- concerned if cefazolin can do that There is occasional serosanguinous stain at incision. Pain stable mild-moderate. Patient ambulatory No fever, chill No diarrhea, n/v  Labs not available  11/18 id f/u Doing really well Wound all closed  Tolerate oral cefadroxil which was started 10/26... no further fatigue No gi sx  02/11/22 id f/u Hasn't taken abx for the last 8 days as he wasn't feeling well. Started out as nausea/epigastric discomfort, then also loose stool to diarrhea progressing to tarry black stool Has not eating much if at all and there are associated fatigue, wobbly gait. No fever, chill No sick contact The stool diarrhea frequency has decreased although he questions if it is due to decreased intake. Stool is soft and more formed Has hx of esophageal dilatation due to stricture. This was last done early spring 2022 Hasn't taken any prednisone or nsaids leading up this   No new back pain   3/09 id f/u Reviewed labs from 2/28 -- mild aki He was seen by GI right a way and patient's report there was lots of inflammation. Given some  kind of pills to take twice a day  He said zofran helping as well with nausea Eating a little better Overall feeling better with strength as well  No more black stool  Back on antibiotics but most days can only do once a day   4/27 id f/u Patient  doing very well. Not having much nausea Still occasional fall due to bourbon drinking No f/c Eating at least 1 meal a day No weight loss   06/19/22 id f/u Patient doing well, eating better Every now and then (every 5th night or so) has severe back pain that his current meds don't help much but other occasions they do help.... He is taking xtampza (oxycodone ER), prn tramadol No fever, chill He is taking memantine for his mild dementia.   Reviewed meds and he is on tylenol pm as well which I asked him to discuss with neurology if this would be in his best benefit with the dementia    Allergies  Allergen Reactions   Aricept [Donepezil Hcl] Diarrhea and Other (See Comments)    Diarrhea, stomach problems.    Atropine Swelling and Other (See Comments)    Eyes swollen and red   Other Other (See Comments)    Moisturizing eye drop - unknown      Outpatient Medications Prior to Visit  Medication Sig Dispense Refill   amLODipine-valsartan (EXFORGE) 10-160 MG tablet Take 1 tablet by mouth daily.     cefadroxil (DURICEF) 500 MG capsule Take 1 capsule (500 mg total) by mouth 2 (two) times daily. 180 capsule 3   cyclobenzaprine (FLEXERIL) 10 MG tablet Take 1 tablet (10 mg total) by mouth 3 (three) times daily as needed for muscle spasms. 30 tablet 0   diphenhydramine-acetaminophen (TYLENOL PM) 25-500 MG TABS tablet Take 2 tablets by mouth at bedtime as needed (sleep).     docusate sodium (COLACE) 100 MG capsule Take 1 capsule (100 mg total) by mouth 2 (two) times daily. 10 capsule 0   fenofibrate (TRICOR) 145 MG tablet Take 1 tablet by mouth daily.     FLUoxetine (PROZAC) 20 MG capsule TAKE 1 CAPSULE BY MOUTH EVERY DAY 90 capsule 2   folic acid (FOLVITE) 1 MG tablet Take 1 mg by mouth daily.     gabapentin (NEURONTIN) 300 MG capsule Take by mouth.     levothyroxine (SYNTHROID) 50 MCG tablet Take 50 mcg by mouth daily before breakfast.     memantine (NAMENDA XR) 28 MG CP24 24 hr capsule  Take 1 capsule (28 mg total) by mouth daily. 90 capsule 3   mirtazapine (REMERON) 30 MG tablet Take 1 tablet (30 mg total) by mouth at bedtime. Take 1 tablet at bedtime 90 tablet 1   Multiple Vitamins-Minerals (MULTIVITAMIN WITH MINERALS) tablet Take 1 tablet by mouth daily.     neomycin-polymyxin b-dexamethasone (MAXITROL) 3.5-10000-0.1 SUSP Place into the left eye.     ondansetron (ZOFRAN) 4 MG tablet Take 1 tablet (4 mg total) by mouth every 8 (eight) hours as needed for nausea or vomiting. 20 tablet 0   oxyCODONE 10 MG TABS Take 1 tablet (10 mg total) by mouth every 3 (three) hours as needed for severe pain ((score 7 to 10)). 30 tablet 0   pantoprazole (PROTONIX) 40 MG tablet Take 1 tablet by mouth 2 (two) times daily.     rosuvastatin (CRESTOR) 10 MG tablet Take 10 mg by mouth daily.      sucralfate (CARAFATE) 1 g tablet Take 1 g by  mouth 3 (three) times daily.     thiamine 100 MG tablet Take 100 mg by mouth daily.     traMADol (ULTRAM) 50 MG tablet Take 50 mg by mouth every 6 (six) hours as needed for moderate pain.     XTAMPZA ER 18 MG C12A Take 1 capsule by mouth every 12 (twelve) hours.     COVID-19 mRNA bivalent vaccine, Pfizer, (PFIZER COVID-19 VAC BIVALENT) injection Inject into the muscle. 0.3 mL 0   No facility-administered medications prior to visit.     Social History   Socioeconomic History   Marital status: Married    Spouse name: Not on file   Number of children: 1   Years of education: 16   Highest education level: Master's degree (e.g., MA, MS, MEng, MEd, MSW, MBA)  Occupational History   Occupation: Retired  Tobacco Use   Smoking status: Former    Types: Cigarettes    Quit date: 1978    Years since quitting: 45.5   Smokeless tobacco: Never  Vaping Use   Vaping Use: Never used  Substance and Sexual Activity   Alcohol use: Yes    Alcohol/week: 14.0 standard drinks of alcohol    Types: 14 Standard drinks or equivalent per week    Comment: 8-10 ounces daily    Drug use: No   Sexual activity: Not on file  Other Topics Concern   Not on file  Social History Narrative   Lives at home with his wife.   Right-handed.   Caffeine- minimal use (4 ounces tea/soda per day)   Social Determinants of Health   Financial Resource Strain: Not on file  Food Insecurity: Not on file  Transportation Needs: Not on file  Physical Activity: Not on file  Stress: Not on file  Social Connections: Not on file  Intimate Partner Violence: Not on file      Review of Systems    All other ros negative  Objective:    BP 133/87   Pulse 80   Resp 16   Ht 5' 6"  (1.676 m)   Wt 158 lb 12.8 oz (72 kg)   SpO2 97%   BMI 25.63 kg/m  Nursing note and vital signs reviewed.  Physical Exam     General/constitutional: no distress, pleasant HEENT: Normocephalic, PER, Conj Clear, EOMI, Oropharynx clear Neck supple CV: rrr no mrg Lungs: clear to auscultation, normal respiratory effort Abd: Soft, Nontender Ext: no edema Skin: No Rash Neuro: nonfocal MSK: no peripheral joint swelling/tenderness/warmth; back spines nontender; scar healed lower back        Labs: Lab Results  Component Value Date   WBC 3.5 (L) 04/10/2022   HGB 12.1 (L) 04/10/2022   HCT 36.0 (L) 04/10/2022   MCV 93.3 04/10/2022   PLT 199 62/94/7654   Last metabolic panel Lab Results  Component Value Date   GLUCOSE 94 04/10/2022   NA 140 04/10/2022   K 5.3 04/10/2022   CL 108 04/10/2022   CO2 24 04/10/2022   BUN 31 (H) 04/10/2022   CREATININE 1.30 (H) 04/10/2022   EGFR 58 (L) 04/10/2022   GFRNONAA 45 (L) 08/29/2021   CALCIUM 9.9 04/10/2022   PROT 6.9 04/10/2022   ALBUMIN 4.1 04/23/2018   BILITOT 0.6 04/10/2022   ALKPHOS 48 04/23/2018   AST 52 (H) 04/10/2022   ALT 20 04/10/2022   ANIONGAP 7 08/29/2021    [facility]: 10/25 cbc 4/8.2/177; cr 0.98 10/18 cbc 6/8.6/251; cr 1.02 9/27 cbc 6/8/225 normal diff; cr 1.04  9/19 cbc 9/9/332; cr 1.02; lft 88/17/151/0.7  Crp: 10/25     16 (<10) 10/18    13 (<10) 9/29    106 (<10) 9/19      52 (<10)  Micro:  Serology:  Imaging:  Assessment & Plan:   Problem List Items Addressed This Visit   None No orders of the defined types were placed in this encounter.   Abx: 10/26-c cefadroxil 500 mg bid since 04/10/2022  9/15-10/26 cefazolin 9/17-10/04 rifampin 300 mg po bid   9/12-15 vanc 9/12-15 cefepime   ASSESSMENT: Surgical site infection/vertebral om lumbar Hardware complicating wound infection Rash suspect abx allergy to cefepime, less likely vanc   76 yo male hx l4-5 fusion 2019 with recurrent back pain/neurogenic claudication, s/p on 8/31 L3-4 laminotomy/facetectomy (complex surgical procedure due to prior lumbar intervody fusion), right L3-4 interbody fusion and zimmer DBM and insertion interbody prosthesis, posterior L3-5 instrumentation, however complicated most recently by early surgical site infection admitted 9/12 for I&D with retention hardware. Patient here for id clinic f/u   Reviewed 9/12 operative note. Hardware retained. Lot of pus in sq space; compromised lumbosacral fascia with pus tracking down to spine/hardware. Wound cx mssa   No sepsis this admission otherwise. No bcx obtained.   Given recent hardware placement <30 days, discharged on combination abx with rifampin  Developed rash on vanc/cefepime. Did ok on cefazolin though  ------- 10/4 id clinic f/u Fatigue/generalized weakness/poor apptite likely abx combination and ongoing pain/pain meds (taking extended oxycodone currently). Hct down and lft up considered rifampin side effect Also has a forehead lesion that is suggestive of shingle. ?cause for crp elevation Pain in lower back severe but stable without neurologic deficit. Incision intact. Patient to discuss pain management with nsg today  At this time, will stop rifampin and continue following labs Will see back in around 3 weeks for follow up   10/26 id clinic assessment Crp  down Some intermittent serosanguinous stain at the incision but no dehiscence; slight erythema around it but ?related to tape/dressing  Fatigue/poor appetite likely multifactorial, low hemoglobin, infection, and ?as below suggested   New legs edema/ongoing fatigue query chf. No previous echo available; no hx chf. Needs chf evaluation. Cefazolin not typical to cause edema  11/18 id assessment Doing well today on oral cefadroxil. Sx from previous 2 clinic visits (fatigue/leg edema) no longer present. Not really sure what it was although temporally related to iv abx use and rifampin  Plan indefinite cefadroxil, maybe decreased dose in another 2-3 months depending on clinical status/improvement  2/28 id assessment Gi sx / tarry stool ?PUD with slow upper gib? Diarrhea slowed down since off abx -- might not be related given duration of time he has been on abx. Doesn't seem to be cdiff Discuss importance of staying on abx due to metals/back infection Also considered dehydration related acute renal injury causing more nausea  3/09 id assessment Clinically improving with upper gi sx Not taking abx as prescribed - advise him this and the ?presumed acid blocker given by GI should be his top priority   04/10/22 id assessment Tolerating 1 gram cefadroxil He is at least 6 months out and clinically doing well (went to [facility] a week prior to this clinic) It would be reasonable to do 500 mg twice a day We also need lab today   06/19/22 id assessment Doing well will forgo lab today Pain -- in setting of dementia advise him to talk with neurology regarding choices of drugs given  his dementia  I can trial him on topical capsaicin cream and see if helps with severe pain as needed Neuropathic pain component blocker will let him discuss with neurology first Advise him to talk about benadryl (tylenol pm) use with neurology as wel Continue cefadroxil 500 mg po bid   He has dmv disabled parking app to  be dropped off with me for me to sign   Follow-up: No follow-ups on file.      Jabier Mutton, [facility] [phone]  pager   [phone] cell 06/19/2022, 8:59 AM

## 2022-06-19 NOTE — Telephone Encounter (Signed)
Handicap Placard was dropped off to our office and signed by Dr. Gale Journey. I have place a copy in triage folder and up front for scanning.  Brandon Fleming

## 2022-07-04 ENCOUNTER — Telehealth (INDEPENDENT_AMBULATORY_CARE_PROVIDER_SITE_OTHER): Payer: Self-pay | Admitting: Clinical Cardiac Electrophysiology

## 2022-07-04 NOTE — Telephone Encounter (Signed)
On 07/01/22 a request was received in [facility] vendor site for release of patient to [facility]. Release was granted and patient was archived in [facility] site.

## 2022-08-05 DIAGNOSIS — M5416 Radiculopathy, lumbar region: Secondary | ICD-10-CM | POA: Diagnosis not present

## 2022-08-05 DIAGNOSIS — M5136 Other intervertebral disc degeneration, lumbar region: Secondary | ICD-10-CM | POA: Diagnosis not present

## 2022-08-12 DIAGNOSIS — M4316 Spondylolisthesis, lumbar region: Secondary | ICD-10-CM | POA: Diagnosis not present

## 2022-08-12 DIAGNOSIS — M5416 Radiculopathy, lumbar region: Secondary | ICD-10-CM | POA: Diagnosis not present

## 2022-08-22 DIAGNOSIS — M4316 Spondylolisthesis, lumbar region: Secondary | ICD-10-CM | POA: Diagnosis not present

## 2022-08-22 DIAGNOSIS — M48061 Spinal stenosis, lumbar region without neurogenic claudication: Secondary | ICD-10-CM | POA: Diagnosis not present

## 2022-08-22 DIAGNOSIS — M5136 Other intervertebral disc degeneration, lumbar region: Secondary | ICD-10-CM | POA: Diagnosis not present

## 2022-08-29 ENCOUNTER — Other Ambulatory Visit: Payer: Self-pay | Admitting: Neurosurgery

## 2022-09-03 ENCOUNTER — Other Ambulatory Visit: Payer: Self-pay | Admitting: Neurosurgery

## 2022-09-10 ENCOUNTER — Other Ambulatory Visit (HOSPITAL_BASED_OUTPATIENT_CLINIC_OR_DEPARTMENT_OTHER): Payer: Self-pay | Admitting: Student in an Organized Health Care Education/Training Program

## 2022-09-10 ENCOUNTER — Telehealth (INDEPENDENT_AMBULATORY_CARE_PROVIDER_SITE_OTHER): Payer: Self-pay | Admitting: Clinical Cardiac Electrophysiology

## 2022-09-10 NOTE — Telephone Encounter (Signed)
Alert transmission received 09/10/22 from patient's [facility] home monitor for an episode suggestive of atrial fibrillation and PMT. These are known findings. Patient is on eliquis and metoprolol succ 75 mg bid. AT/AF burden 1%. Presenting EGM shows AP VS @ 96 bpm with SIR activated. Device and leads appear otherwise stable. Will route transmission to Dr. He for her review. This is also a routine billable remote transmission and will be routed via [facility] to EP reader of the day.    Of note, patient was released in [employer] in July per the request of the patient as he is now receiving care from [facility]. Phoned [facility] to request they take over patient's remote monitoring. RN from device clinic was with a patient so receptionist took message and our office number. I asked that they request to speak to someone in device clinic.    Please see scanned documents in cardiology tab for complete device information.

## 2022-09-11 NOTE — Progress Notes (Signed)
Surgical Instructions    Your procedure is scheduled on Monday October 9.  Report to [facility] Main Entrance "A" at 8:20 A.M., then check in with the Admitting office.  Call this number if you have problems the morning of surgery:  [phone]   If you have any questions prior to your surgery date call [phone]: Open Monday-Friday 8am-4pm If you experience any cold or flu symptoms such as cough, fever, chills, shortness of breath, etc. between now and your scheduled surgery, please notify us at the above number     Remember:  Do not eat or drink anything after midnight the night before your surgery    Take these medicines the morning of surgery with A SIP OF WATER:  cefadroxil (DURICEF)  fenofibrate (TRICOR)  FLUoxetine (PROZAC) gabapentin (NEURONTIN) levothyroxine (SYNTHROID) memantine (NAMENDA XR) oxyCODONE if needed pantoprazole (PROTONIX) rosuvastatin (CRESTOR)  sucralfate (CARAFATE)  traMADol (ULTRAM) if needed XTAMPZA ER   As of today, STOP taking any Aspirin (unless otherwise instructed by your surgeon) Aleve, Naproxen, Ibuprofen, Motrin, Advil, Goody's, BC's, all herbal medications, fish oil, and all vitamins.           Do not wear jewelry or makeup. Do not wear lotions, powders, perfumes/cologne or deodorant. Do not shave 48 hours prior to surgery.  Men may shave face and neck. Do not bring valuables to the hospital. Do not wear nail polish, gel polish, artificial nails, or any other type of covering on natural nails (fingers and toes) If you have artificial nails or gel coating that need to be removed by a nail salon, please have this removed prior to surgery. Artificial nails or gel coating may interfere with anesthesia's ability to adequately monitor your vital signs.  [facility] is not responsible for any belongings or valuables.    Do NOT Smoke (Tobacco/Vaping)  24 hours prior to your procedure  If you use a CPAP at night, you may bring your mask for  your overnight stay.   Contacts, glasses, hearing aids, dentures or partials may not be worn into surgery, please bring cases for these belongings   For patients admitted to the hospital, discharge time will be determined by your treatment team.   Patients discharged the day of surgery will not be allowed to drive home, and someone needs to stay with them for 24 hours.   SURGICAL WAITING ROOM VISITATION Patients having surgery or a procedure may have no more than 2 support people in the waiting area - these visitors may rotate.   Children under the age of 76 must have an adult with them who is not the patient. If the patient needs to stay at the hospital during part of their recovery, the visitor guidelines for inpatient rooms apply. Pre-op nurse will coordinate an appropriate time for 1 support person to accompany patient in pre-op.  This support person may not rotate.   Please refer to the [facility] website for the visitor guidelines for Inpatients (after your surgery is over and you are in a regular room).    Special instructions:    Oral Hygiene is also important to reduce your risk of infection.  Remember - BRUSH YOUR TEETH THE MORNING OF SURGERY WITH YOUR REGULAR TOOTHPASTE   [facility]- Preparing For Surgery  Before surgery, you can play an important role. Because skin is not sterile, your skin needs to be as free of germs as possible. You can reduce the number of germs on your skin by washing with CHG (chlorahexidine  gluconate) Soap before surgery.  CHG is an antiseptic cleaner which kills germs and bonds with the skin to continue killing germs even after washing.     Please do not use if you have an allergy to CHG or antibacterial soaps. If your skin becomes reddened/irritated stop using the CHG.  Do not shave (including legs and underarms) for at least 48 hours prior to first CHG shower. It is OK to shave your face.  Please follow these instructions carefully.     Shower  the NIGHT BEFORE SURGERY and the MORNING OF SURGERY with CHG Soap.   If you chose to wash your hair, wash your hair first as usual with your normal shampoo. After you shampoo, rinse your hair and body thoroughly to remove the shampoo.  Then [employer] and genitals (private parts) with your normal soap and rinse thoroughly to remove soap.  After that Use CHG Soap as you would any other liquid soap. You can apply CHG directly to the skin and wash gently with a scrungie or a clean washcloth.   Apply the CHG Soap to your body ONLY FROM THE NECK DOWN.  Do not use on open wounds or open sores. Avoid contact with your eyes, ears, mouth and genitals (private parts). Wash Face and genitals (private parts)  with your normal soap.   Wash thoroughly, paying special attention to the area where your surgery will be performed.  Thoroughly rinse your body with warm water from the neck down.  DO NOT shower/wash with your normal soap after using and rinsing off the CHG Soap.  Pat yourself dry with a CLEAN TOWEL.  Wear CLEAN PAJAMAS to bed the night before surgery  Place CLEAN SHEETS on your bed the night before your surgery  DO NOT SLEEP WITH PETS.   Day of Surgery:  Take a shower with CHG soap. Wear Clean/Comfortable clothing the morning of surgery Do not apply any deodorants/lotions.   Remember to brush your teeth WITH YOUR REGULAR TOOTHPASTE.    If you received a COVID test during your pre-op visit, it is requested that you wear a mask when out in public, stay away from anyone that may not be feeling well, and notify your surgeon if you develop symptoms. If you have been in contact with anyone that has tested positive in the last 10 days, please notify your surgeon.    Please read over the following fact sheets that you were given.

## 2022-09-11 NOTE — Telephone Encounter (Signed)
Nicholes Mango, Device RN at [facility], called to inform that they just enrolled the patient in their remote monitoring in [place].    Collie Siad apologizes for the delay in getting the patient enrolled on their side.

## 2022-09-12 ENCOUNTER — Other Ambulatory Visit: Payer: Self-pay

## 2022-09-12 ENCOUNTER — Encounter (HOSPITAL_COMMUNITY)
Admission: RE | Admit: 2022-09-12 | Discharge: 2022-09-12 | Disposition: A | Discharge status: Home / Self Care | Payer: Medicare Other | Source: Ambulatory Visit | Attending: Neurosurgery | Admitting: Neurosurgery

## 2022-09-12 ENCOUNTER — Encounter (HOSPITAL_COMMUNITY): Payer: Self-pay

## 2022-09-12 VITALS — BP 148/76 | HR 74 | Temp 98.0°F | Resp 17 | Ht 66.0 in | Wt 168.6 lb

## 2022-09-12 DIAGNOSIS — Z01818 Encounter for other preprocedural examination: Secondary | ICD-10-CM

## 2022-09-12 DIAGNOSIS — I10 Essential (primary) hypertension: Secondary | ICD-10-CM | POA: Diagnosis not present

## 2022-09-12 LAB — CARDIAC DEVICE CHECK - REMOTE
AT Burden Percent: 1
Atrial Pacing Percent: 95
Battery Remaining Longevity: 91
Battery Remaining Percentage: 77
Battery Voltage: 3.01
RA Impedance: 480
RA Intrinsic Amplitude: 5
RA Threshold Amplitude: 0.875
RV Impedance: 510
RV Intrinsic Amplitude: 5.6
RV Pacing Percent: 1

## 2022-09-12 LAB — COMPREHENSIVE METABOLIC PANEL
ALT: 35 U/L (ref 0–44)
AST: 87 U/L — ABNORMAL HIGH (ref 15–41)
Albumin: 3.1 g/dL — ABNORMAL LOW (ref 3.5–5.0)
Alkaline Phosphatase: 64 U/L (ref 38–126)
Anion gap: 5 (ref 5–15)
BUN: 30 mg/dL — ABNORMAL HIGH (ref 8–23)
CO2: 22 mmol/L (ref 22–32)
Calcium: 9.3 mg/dL (ref 8.9–10.3)
Chloride: 112 mmol/L — ABNORMAL HIGH (ref 98–111)
Creatinine, Ser: 1.65 mg/dL — ABNORMAL HIGH (ref 0.61–1.24)
GFR, Estimated: 43 mL/min — ABNORMAL LOW (ref >60)
Glucose, Bld: 106 mg/dL — ABNORMAL HIGH (ref 70–99)
Potassium: 4.8 mmol/L (ref 3.5–5.1)
Sodium: 139 mmol/L (ref 135–145)
Total Bilirubin: 0.4 mg/dL (ref 0.3–1.2)
Total Protein: 6.3 g/dL — ABNORMAL LOW (ref 6.5–8.1)

## 2022-09-12 LAB — CBC
HCT: 35.1 % — ABNORMAL LOW (ref 39.0–52.0)
Hemoglobin: 11.7 g/dL — ABNORMAL LOW (ref 13.0–17.0)
MCH: 32 pg (ref 26.0–34.0)
MCHC: 33.3 g/dL (ref 30.0–36.0)
MCV: 95.9 fL (ref 80.0–100.0)
Platelets: 180 10*3/uL (ref 150–400)
RBC: 3.66 MIL/uL — ABNORMAL LOW (ref 4.22–5.81)
RDW: 14.1 % (ref 11.5–15.5)
WBC: 4.5 10*3/uL (ref 4.0–10.5)
nRBC: 0 % (ref 0.0–0.2)

## 2022-09-12 LAB — SURGICAL PCR SCREEN
MRSA, PCR: NEGATIVE
Staphylococcus aureus: NEGATIVE

## 2022-09-12 NOTE — Progress Notes (Addendum)
PCP - Dr. Dagmar Hait Cardiologist - Denies  PPM/ICD - Denies Device Orders -  Rep Notified -   Chest x-ray - 03/31/18 EKG - 09/12/22 Stress Test - Denies ECHO - Denies Cardiac Cath - Denies  Sleep Study - Denies  DM - Denies  Aspirin Instructions:Denies  ERAS Protcol -No   COVID TEST- NI   Anesthesia review: Yes history of staph infection, seeing Infectious Disease Dr.  Patient denies shortness of breath, fever, cough and chest pain at PAT appointment   All instructions explained to the patient, with a verbal understanding of the material. Patient agrees to go over the instructions while at home for a better understanding.The opportunity to ask questions was provided.  Addendum 1157 Will need to re collect type and screen on day of surgery. Lab called and detected antibodies in type and screen at pre admission testing.

## 2022-09-15 ENCOUNTER — Other Ambulatory Visit (HOSPITAL_COMMUNITY): Payer: Medicare Other

## 2022-09-22 ENCOUNTER — Inpatient Hospital Stay (HOSPITAL_COMMUNITY)
Admission: RE | Disposition: A | Discharge status: Home / Self Care | Payer: Self-pay | Source: Home / Self Care | Attending: Neurosurgery

## 2022-09-22 ENCOUNTER — Encounter (HOSPITAL_COMMUNITY): Payer: Self-pay | Admitting: Neurosurgery

## 2022-09-22 ENCOUNTER — Other Ambulatory Visit: Payer: Self-pay

## 2022-09-22 ENCOUNTER — Inpatient Hospital Stay (HOSPITAL_COMMUNITY)
Admission: RE | Admit: 2022-09-22 | Discharge: 2022-09-23 | DRG: 455 | Disposition: A | Discharge status: Home / Self Care | Payer: Medicare Other | Attending: Neurosurgery | Admitting: Neurosurgery

## 2022-09-22 ENCOUNTER — Inpatient Hospital Stay (HOSPITAL_COMMUNITY): Payer: Medicare Other | Admitting: Certified Registered Nurse Anesthetist

## 2022-09-22 ENCOUNTER — Inpatient Hospital Stay (HOSPITAL_COMMUNITY): Payer: Medicare Other | Admitting: Physician Assistant

## 2022-09-22 ENCOUNTER — Inpatient Hospital Stay (HOSPITAL_COMMUNITY): Payer: Medicare Other

## 2022-09-22 DIAGNOSIS — Z87891 Personal history of nicotine dependence: Secondary | ICD-10-CM

## 2022-09-22 DIAGNOSIS — F32A Depression, unspecified: Secondary | ICD-10-CM | POA: Diagnosis present

## 2022-09-22 DIAGNOSIS — M4316 Spondylolisthesis, lumbar region: Secondary | ICD-10-CM | POA: Diagnosis present

## 2022-09-22 DIAGNOSIS — F419 Anxiety disorder, unspecified: Secondary | ICD-10-CM | POA: Diagnosis present

## 2022-09-22 DIAGNOSIS — M5116 Intervertebral disc disorders with radiculopathy, lumbar region: Secondary | ICD-10-CM

## 2022-09-22 DIAGNOSIS — M48062 Spinal stenosis, lumbar region with neurogenic claudication: Secondary | ICD-10-CM | POA: Diagnosis not present

## 2022-09-22 DIAGNOSIS — Z841 Family history of disorders of kidney and ureter: Secondary | ICD-10-CM

## 2022-09-22 DIAGNOSIS — Z79890 Hormone replacement therapy (postmenopausal): Secondary | ICD-10-CM | POA: Diagnosis not present

## 2022-09-22 DIAGNOSIS — Z8249 Family history of ischemic heart disease and other diseases of the circulatory system: Secondary | ICD-10-CM | POA: Diagnosis not present

## 2022-09-22 DIAGNOSIS — E785 Hyperlipidemia, unspecified: Secondary | ICD-10-CM | POA: Diagnosis present

## 2022-09-22 DIAGNOSIS — M4726 Other spondylosis with radiculopathy, lumbar region: Secondary | ICD-10-CM | POA: Diagnosis present

## 2022-09-22 DIAGNOSIS — Z79899 Other long term (current) drug therapy: Secondary | ICD-10-CM

## 2022-09-22 DIAGNOSIS — Z981 Arthrodesis status: Secondary | ICD-10-CM | POA: Diagnosis not present

## 2022-09-22 DIAGNOSIS — M5136 Other intervertebral disc degeneration, lumbar region: Secondary | ICD-10-CM | POA: Diagnosis not present

## 2022-09-22 DIAGNOSIS — Z888 Allergy status to other drugs, medicaments and biological substances status: Secondary | ICD-10-CM | POA: Diagnosis not present

## 2022-09-22 DIAGNOSIS — Z820 Family history of epilepsy and other diseases of the nervous system: Secondary | ICD-10-CM

## 2022-09-22 DIAGNOSIS — Z01818 Encounter for other preprocedural examination: Secondary | ICD-10-CM

## 2022-09-22 DIAGNOSIS — I10 Essential (primary) hypertension: Secondary | ICD-10-CM | POA: Diagnosis present

## 2022-09-22 DIAGNOSIS — K219 Gastro-esophageal reflux disease without esophagitis: Secondary | ICD-10-CM | POA: Diagnosis present

## 2022-09-22 LAB — TYPE AND SCREEN
ABO/RH(D): A NEG
Antibody Screen: POSITIVE

## 2022-09-22 SURGERY — POSTERIOR LUMBAR FUSION 1 LEVEL
Anesthesia: General

## 2022-09-22 MED ORDER — MEMANTINE HCL ER 28 MG PO CP24
28.0000 mg | ORAL_CAPSULE | Freq: Every day | ORAL | Status: DC
  Filled 2022-09-22: qty 1

## 2022-09-22 MED ORDER — LEVOTHYROXINE SODIUM 25 MCG PO TABS
50.0000 ug | ORAL_TABLET | Freq: Every day | ORAL | Status: DC
  Administered 2022-09-23: 50 ug via ORAL
  Filled 2022-09-22: qty 2

## 2022-09-22 MED ORDER — SODIUM CHLORIDE 0.9% FLUSH: 3.0000 mL | INTRAVENOUS | Status: DC | PRN

## 2022-09-22 MED ORDER — FENTANYL CITRATE (PF) 100 MCG/2ML IJ SOLN
INTRAMUSCULAR | Status: AC
  Filled 2022-09-22: qty 2

## 2022-09-22 MED ORDER — SUGAMMADEX SODIUM 200 MG/2ML IV SOLN
INTRAVENOUS | Status: DC | PRN
  Administered 2022-09-22: 200 mg via INTRAVENOUS

## 2022-09-22 MED ORDER — 0.9 % SODIUM CHLORIDE (POUR BTL) OPTIME
TOPICAL | Status: DC | PRN
  Administered 2022-09-22: 1000 mL

## 2022-09-22 MED ORDER — ONDANSETRON HCL 4 MG/2ML IJ SOLN
INTRAMUSCULAR | Status: DC | PRN
  Administered 2022-09-22: 4 mg via INTRAVENOUS

## 2022-09-22 MED ORDER — ROSUVASTATIN CALCIUM 5 MG PO TABS: 10.0000 mg | ORAL_TABLET | Freq: Every morning | ORAL | Status: DC

## 2022-09-22 MED ORDER — ROCURONIUM BROMIDE 10 MG/ML (PF) SYRINGE
PREFILLED_SYRINGE | INTRAVENOUS | Status: AC
  Filled 2022-09-22: qty 10

## 2022-09-22 MED ORDER — FENTANYL CITRATE (PF) 250 MCG/5ML IJ SOLN
INTRAMUSCULAR | Status: AC
  Filled 2022-09-22: qty 5

## 2022-09-22 MED ORDER — CEFAZOLIN SODIUM-DEXTROSE 2-3 GM-%(50ML) IV SOLR
INTRAVENOUS | Status: DC | PRN
  Administered 2022-09-22: 2 g via INTRAVENOUS

## 2022-09-22 MED ORDER — DEXAMETHASONE SODIUM PHOSPHATE 10 MG/ML IJ SOLN
INTRAMUSCULAR | Status: AC
  Filled 2022-09-22: qty 1

## 2022-09-22 MED ORDER — SODIUM CHLORIDE 0.9 % IV SOLN
250.0000 mL | INTRAVENOUS | Status: DC
  Administered 2022-09-22: 250 mL via INTRAVENOUS

## 2022-09-22 MED ORDER — PHENYLEPHRINE 80 MCG/ML (10ML) SYRINGE FOR IV PUSH (FOR BLOOD PRESSURE SUPPORT)
PREFILLED_SYRINGE | INTRAVENOUS | Status: DC | PRN
  Administered 2022-09-22: 160 ug via INTRAVENOUS

## 2022-09-22 MED ORDER — IRBESARTAN 150 MG PO TABS: 150.0000 mg | ORAL_TABLET | Freq: Every day | ORAL | Status: DC

## 2022-09-22 MED ORDER — OXYCODONE HCL 5 MG PO TABS
10.0000 mg | ORAL_TABLET | ORAL | Status: DC | PRN
  Administered 2022-09-22 – 2022-09-23 (×4): 10 mg via ORAL
  Filled 2022-09-22 (×4): qty 2

## 2022-09-22 MED ORDER — MIRTAZAPINE 30 MG PO TABS
30.0000 mg | ORAL_TABLET | Freq: Every day | ORAL | Status: DC
  Administered 2022-09-22: 30 mg via ORAL
  Filled 2022-09-22 (×2): qty 1

## 2022-09-22 MED ORDER — CEFAZOLIN SODIUM-DEXTROSE 2-4 GM/100ML-% IV SOLN
2.0000 g | INTRAVENOUS | Status: DC
  Filled 2022-09-22: qty 100

## 2022-09-22 MED ORDER — LACTATED RINGERS IV SOLN: INTRAVENOUS | Status: DC

## 2022-09-22 MED ORDER — LIDOCAINE 2% (20 MG/ML) 5 ML SYRINGE
INTRAMUSCULAR | Status: AC
  Filled 2022-09-22: qty 5

## 2022-09-22 MED ORDER — PROPOFOL 10 MG/ML IV BOLUS
INTRAVENOUS | Status: DC | PRN
  Administered 2022-09-22: 140 mg via INTRAVENOUS

## 2022-09-22 MED ORDER — ORAL CARE MOUTH RINSE: 15.0000 mL | Freq: Once | OROMUCOSAL | Status: AC

## 2022-09-22 MED ORDER — THROMBIN 5000 UNITS EX SOLR
OROMUCOSAL | Status: DC | PRN
  Administered 2022-09-22: 5 mL via TOPICAL

## 2022-09-22 MED ORDER — EPHEDRINE SULFATE-NACL 50-0.9 MG/10ML-% IV SOSY
PREFILLED_SYRINGE | INTRAVENOUS | Status: DC | PRN
  Administered 2022-09-22 (×2): 5 mg via INTRAVENOUS

## 2022-09-22 MED ORDER — GABAPENTIN 300 MG PO CAPS: 300.0000 mg | ORAL_CAPSULE | Freq: Every morning | ORAL | Status: DC

## 2022-09-22 MED ORDER — BUPIVACAINE-EPINEPHRINE (PF) 0.5% -1:200000 IJ SOLN
INTRAMUSCULAR | Status: AC
  Filled 2022-09-22: qty 30

## 2022-09-22 MED ORDER — AMLODIPINE BESYLATE 5 MG PO TABS: 10.0000 mg | ORAL_TABLET | Freq: Every day | ORAL | Status: DC

## 2022-09-22 MED ORDER — FENTANYL CITRATE (PF) 250 MCG/5ML IJ SOLN
INTRAMUSCULAR | Status: DC | PRN
  Administered 2022-09-22 (×5): 50 ug via INTRAVENOUS

## 2022-09-22 MED ORDER — OXYCODONE HCL 5 MG PO TABS: 5.0000 mg | ORAL_TABLET | ORAL | Status: DC | PRN

## 2022-09-22 MED ORDER — THROMBIN 5000 UNITS EX SOLR
CUTANEOUS | Status: AC
  Filled 2022-09-22: qty 5000

## 2022-09-22 MED ORDER — AMLODIPINE BESYLATE-VALSARTAN 10-160 MG PO TABS: 1.0000 | ORAL_TABLET | Freq: Every day | ORAL | Status: DC

## 2022-09-22 MED ORDER — FENOFIBRATE 160 MG PO TABS: 160.0000 mg | ORAL_TABLET | Freq: Every day | ORAL | Status: DC

## 2022-09-22 MED ORDER — BUPIVACAINE-EPINEPHRINE (PF) 0.5% -1:200000 IJ SOLN
INTRAMUSCULAR | Status: DC | PRN
  Administered 2022-09-22: 10 mL

## 2022-09-22 MED ORDER — BISACODYL 10 MG RE SUPP: 10.0000 mg | Freq: Every day | RECTAL | Status: DC | PRN

## 2022-09-22 MED ORDER — CHLORHEXIDINE GLUCONATE 0.12 % MT SOLN
15.0000 mL | Freq: Once | OROMUCOSAL | Status: AC
  Administered 2022-09-22: 15 mL via OROMUCOSAL
  Filled 2022-09-22: qty 15

## 2022-09-22 MED ORDER — OXYCODONE HCL ER 10 MG PO T12A
20.0000 mg | EXTENDED_RELEASE_TABLET | Freq: Two times a day (BID) | ORAL | Status: DC
  Administered 2022-09-22: 20 mg via ORAL
  Filled 2022-09-22: qty 2

## 2022-09-22 MED ORDER — BUPIVACAINE LIPOSOME 1.3 % IJ SUSP
INTRAMUSCULAR | Status: AC
  Filled 2022-09-22: qty 20

## 2022-09-22 MED ORDER — CEFAZOLIN SODIUM-DEXTROSE 2-4 GM/100ML-% IV SOLN
2.0000 g | Freq: Three times a day (TID) | INTRAVENOUS | Status: AC
  Administered 2022-09-22 – 2022-09-23 (×2): 2 g via INTRAVENOUS
  Filled 2022-09-22 (×2): qty 100

## 2022-09-22 MED ORDER — ONDANSETRON HCL 4 MG/2ML IJ SOLN
INTRAMUSCULAR | Status: AC
  Filled 2022-09-22: qty 2

## 2022-09-22 MED ORDER — ONDANSETRON HCL 4 MG/2ML IJ SOLN: 4.0000 mg | Freq: Four times a day (QID) | INTRAMUSCULAR | Status: DC | PRN

## 2022-09-22 MED ORDER — ROCURONIUM BROMIDE 10 MG/ML (PF) SYRINGE
PREFILLED_SYRINGE | INTRAVENOUS | Status: DC | PRN
  Administered 2022-09-22: 60 mg via INTRAVENOUS
  Administered 2022-09-22: 40 mg via INTRAVENOUS
  Administered 2022-09-22: 10 mg via INTRAVENOUS
  Administered 2022-09-22: 30 mg via INTRAVENOUS

## 2022-09-22 MED ORDER — HYDROMORPHONE HCL 1 MG/ML IJ SOLN
INTRAMUSCULAR | Status: AC
  Filled 2022-09-22: qty 1

## 2022-09-22 MED ORDER — DOCUSATE SODIUM 100 MG PO CAPS
100.0000 mg | ORAL_CAPSULE | Freq: Two times a day (BID) | ORAL | Status: DC
  Administered 2022-09-22: 100 mg via ORAL
  Filled 2022-09-22: qty 1

## 2022-09-22 MED ORDER — THROMBIN (RECOMBINANT) 5000 UNITS EX SOLR
CUTANEOUS | Status: AC
  Filled 2022-09-22: qty 5000

## 2022-09-22 MED ORDER — PHENOL 1.4 % MT LIQD: 1.0000 | OROMUCOSAL | Status: DC | PRN

## 2022-09-22 MED ORDER — ACETAMINOPHEN 325 MG PO TABS: 650.0000 mg | ORAL_TABLET | ORAL | Status: DC | PRN

## 2022-09-22 MED ORDER — DEXAMETHASONE SODIUM PHOSPHATE 10 MG/ML IJ SOLN
INTRAMUSCULAR | Status: DC | PRN
  Administered 2022-09-22: 10 mg via INTRAVENOUS

## 2022-09-22 MED ORDER — BACITRACIN ZINC 500 UNIT/GM EX OINT
TOPICAL_OINTMENT | CUTANEOUS | Status: DC | PRN
  Administered 2022-09-22: 1 via TOPICAL

## 2022-09-22 MED ORDER — ACETAMINOPHEN 500 MG PO TABS
1000.0000 mg | ORAL_TABLET | Freq: Four times a day (QID) | ORAL | Status: DC
  Administered 2022-09-22 – 2022-09-23 (×2): 1000 mg via ORAL
  Filled 2022-09-22 (×3): qty 2

## 2022-09-22 MED ORDER — ACETAMINOPHEN 650 MG RE SUPP: 650.0000 mg | RECTAL | Status: DC | PRN

## 2022-09-22 MED ORDER — THROMBIN 5000 UNITS EX SOLR: OROMUCOSAL | Status: DC | PRN

## 2022-09-22 MED ORDER — OXYCODONE HCL 10 MG PO TABS: 10.0000 mg | ORAL_TABLET | ORAL | Status: DC | PRN

## 2022-09-22 MED ORDER — PHENYLEPHRINE HCL-NACL 20-0.9 MG/250ML-% IV SOLN
INTRAVENOUS | Status: DC | PRN
  Administered 2022-09-22: 25 ug/min via INTRAVENOUS

## 2022-09-22 MED ORDER — BUPIVACAINE LIPOSOME 1.3 % IJ SUSP
INTRAMUSCULAR | Status: DC | PRN
  Administered 2022-09-22: 20 mL

## 2022-09-22 MED ORDER — LIDOCAINE 2% (20 MG/ML) 5 ML SYRINGE
INTRAMUSCULAR | Status: DC | PRN
  Administered 2022-09-22: 60 mg via INTRAVENOUS

## 2022-09-22 MED ORDER — HYDROMORPHONE HCL 1 MG/ML IJ SOLN
0.2500 mg | INTRAMUSCULAR | Status: DC | PRN
  Administered 2022-09-22: 0.5 mg via INTRAVENOUS

## 2022-09-22 MED ORDER — CHLORHEXIDINE GLUCONATE CLOTH 2 % EX PADS: 6.0000 | MEDICATED_PAD | Freq: Once | CUTANEOUS | Status: DC

## 2022-09-22 MED ORDER — ACETAMINOPHEN 10 MG/ML IV SOLN
INTRAVENOUS | Status: AC
  Filled 2022-09-22: qty 100

## 2022-09-22 MED ORDER — CYCLOBENZAPRINE HCL 10 MG PO TABS
10.0000 mg | ORAL_TABLET | Freq: Three times a day (TID) | ORAL | Status: DC | PRN
  Administered 2022-09-22: 10 mg via ORAL
  Filled 2022-09-22: qty 1

## 2022-09-22 MED ORDER — PROPOFOL 10 MG/ML IV BOLUS
INTRAVENOUS | Status: AC
  Filled 2022-09-22: qty 20

## 2022-09-22 MED ORDER — ACETAMINOPHEN 10 MG/ML IV SOLN
1000.0000 mg | Freq: Once | INTRAVENOUS | Status: DC | PRN
  Administered 2022-09-22: 1000 mg via INTRAVENOUS

## 2022-09-22 MED ORDER — SODIUM CHLORIDE 0.9% FLUSH
3.0000 mL | Freq: Two times a day (BID) | INTRAVENOUS | Status: DC
  Administered 2022-09-22: 3 mL via INTRAVENOUS

## 2022-09-22 MED ORDER — FENTANYL CITRATE (PF) 100 MCG/2ML IJ SOLN
25.0000 ug | INTRAMUSCULAR | Status: DC | PRN
  Administered 2022-09-22 (×3): 50 ug via INTRAVENOUS

## 2022-09-22 MED ORDER — FLUOXETINE HCL 20 MG PO CAPS: 20.0000 mg | ORAL_CAPSULE | Freq: Every day | ORAL | Status: DC

## 2022-09-22 MED ORDER — MORPHINE SULFATE (PF) 4 MG/ML IV SOLN
4.0000 mg | INTRAVENOUS | Status: DC | PRN
  Administered 2022-09-22: 4 mg via INTRAVENOUS
  Filled 2022-09-22: qty 1

## 2022-09-22 MED ORDER — MENTHOL 3 MG MT LOZG: 1.0000 | LOZENGE | OROMUCOSAL | Status: DC | PRN

## 2022-09-22 MED ORDER — SUCRALFATE 1 G PO TABS
1.0000 g | ORAL_TABLET | Freq: Two times a day (BID) | ORAL | Status: DC
  Administered 2022-09-22: 1 g via ORAL
  Filled 2022-09-22: qty 1

## 2022-09-22 MED ORDER — PANTOPRAZOLE SODIUM 40 MG PO TBEC
40.0000 mg | DELAYED_RELEASE_TABLET | Freq: Two times a day (BID) | ORAL | Status: DC
  Administered 2022-09-22: 40 mg via ORAL
  Filled 2022-09-22: qty 1

## 2022-09-22 MED ORDER — ONDANSETRON HCL 4 MG PO TABS: 4.0000 mg | ORAL_TABLET | Freq: Four times a day (QID) | ORAL | Status: DC | PRN

## 2022-09-22 SURGICAL SUPPLY — 65 items
APL SKNCLS STERI-STRIP NONHPOA (GAUZE/BANDAGES/DRESSINGS) ×1
BAG COUNTER SPONGE SURGICOUNT (BAG) ×1 IMPLANT
BAG SPNG CNTER NS LX DISP (BAG) ×1
BASKET BONE COLLECTION (BASKET) ×1 IMPLANT
BENZOIN TINCTURE PRP APPL 2/3 (GAUZE/BANDAGES/DRESSINGS) ×1 IMPLANT
BLADE CLIPPER SURG (BLADE) IMPLANT
BUR MATCHSTICK NEURO 3.0 LAGG (BURR) ×1 IMPLANT
BUR PRECISION FLUTE 6.0 (BURR) ×1 IMPLANT
CANISTER SUCT 3000ML PPV (MISCELLANEOUS) ×1 IMPLANT
CAP REVERE LOCKING (Cap) IMPLANT
CNTNR URN SCR LID CUP LEK RST (MISCELLANEOUS) ×1 IMPLANT
CONT SPEC 4OZ STRL OR WHT (MISCELLANEOUS)
COVER BACK TABLE 60X90IN (DRAPES) ×1 IMPLANT
DRAPE C-ARM 42X72 X-RAY (DRAPES) ×2 IMPLANT
DRAPE HALF SHEET 40X57 (DRAPES) ×1 IMPLANT
DRAPE LAPAROTOMY 100X72X124 (DRAPES) ×1 IMPLANT
DRAPE SURG 17X23 STRL (DRAPES) ×4 IMPLANT
DRSG OPSITE POSTOP 4X6 (GAUZE/BANDAGES/DRESSINGS) ×1 IMPLANT
DRSG OPSITE POSTOP 4X8 (GAUZE/BANDAGES/DRESSINGS) IMPLANT
ELECT BLADE 4.0 EZ CLEAN MEGAD (MISCELLANEOUS) ×1
ELECT REM PT RETURN 9FT ADLT (ELECTROSURGICAL) ×1
ELECTRODE BLDE 4.0 EZ CLN MEGD (MISCELLANEOUS) ×1 IMPLANT
ELECTRODE REM PT RTRN 9FT ADLT (ELECTROSURGICAL) ×1 IMPLANT
EVACUATOR 1/8 PVC DRAIN (DRAIN) IMPLANT
GAUZE 4X4 16PLY ~~LOC~~+RFID DBL (SPONGE) ×1 IMPLANT
GLOVE BIO SURGEON STRL SZ 6 (GLOVE) ×1 IMPLANT
GLOVE BIO SURGEON STRL SZ8 (GLOVE) ×2 IMPLANT
GLOVE BIO SURGEON STRL SZ8.5 (GLOVE) ×2 IMPLANT
GLOVE BIOGEL PI IND STRL 6.5 (GLOVE) ×1 IMPLANT
GLOVE EXAM NITRILE XL STR (GLOVE) IMPLANT
GOWN STRL REUS W/ TWL LRG LVL3 (GOWN DISPOSABLE) ×1 IMPLANT
GOWN STRL REUS W/ TWL XL LVL3 (GOWN DISPOSABLE) ×2 IMPLANT
GOWN STRL REUS W/TWL 2XL LVL3 (GOWN DISPOSABLE) IMPLANT
GOWN STRL REUS W/TWL LRG LVL3 (GOWN DISPOSABLE) ×1
GOWN STRL REUS W/TWL XL LVL3 (GOWN DISPOSABLE) ×2
HEMOSTAT POWDER KIT SURGIFOAM (HEMOSTASIS) ×1 IMPLANT
HEMOSTAT POWDER SURGIFOAM 1G (HEMOSTASIS) IMPLANT
KIT BASIN OR (CUSTOM PROCEDURE TRAY) ×1 IMPLANT
KIT GRAFTMAG DEL NEURO DISP (NEUROSURGERY SUPPLIES) IMPLANT
KIT TURNOVER KIT B (KITS) ×1 IMPLANT
NDL HYPO 21X1.5 SAFETY (NEEDLE) IMPLANT
NEEDLE HYPO 21X1.5 SAFETY (NEEDLE) IMPLANT
NEEDLE HYPO 22GX1.5 SAFETY (NEEDLE) ×1 IMPLANT
NS IRRIG 1000ML POUR BTL (IV SOLUTION) ×1 IMPLANT
PACK LAMINECTOMY NEURO (CUSTOM PROCEDURE TRAY) ×1 IMPLANT
PAD ARMBOARD 7.5X6 YLW CONV (MISCELLANEOUS) ×3 IMPLANT
PATTIES SURGICAL .5 X1 (DISPOSABLE) IMPLANT
PUTTY DBM 5CC CALC GRAN (Putty) IMPLANT
ROD CURVED 100MM (Rod) IMPLANT
SCREW REVERE 6.35 75X55MM (Screw) IMPLANT
SPACER ALTERA 10X31 9-13MM-8 (Spacer) IMPLANT
SPIKE FLUID TRANSFER (MISCELLANEOUS) ×1 IMPLANT
SPONGE NEURO XRAY DETECT 1X3 (DISPOSABLE) IMPLANT
SPONGE SURGIFOAM ABS GEL 100 (HEMOSTASIS) IMPLANT
SPONGE T-LAP 4X18 ~~LOC~~+RFID (SPONGE) IMPLANT
STRIP CLOSURE SKIN 1/2X4 (GAUZE/BANDAGES/DRESSINGS) ×1 IMPLANT
SUT VIC AB 1 CT1 18XBRD ANBCTR (SUTURE) ×2 IMPLANT
SUT VIC AB 1 CT1 8-18 (SUTURE) ×1
SUT VIC AB 2-0 CP2 18 (SUTURE) ×2 IMPLANT
SYR 20ML LL LF (SYRINGE) IMPLANT
TAP SURG GLBU 6.5X40 (ORTHOPEDIC DISPOSABLE SUPPLIES) IMPLANT
TOWEL GREEN STERILE (TOWEL DISPOSABLE) ×1 IMPLANT
TOWEL GREEN STERILE FF (TOWEL DISPOSABLE) ×1 IMPLANT
TRAY FOLEY MTR SLVR 16FR STAT (SET/KITS/TRAYS/PACK) ×1 IMPLANT
WATER STERILE IRR 1000ML POUR (IV SOLUTION) ×1 IMPLANT

## 2022-09-22 NOTE — Anesthesia Postprocedure Evaluation (Signed)
Anesthesia Post Note  Patient: Brandon Fleming  Procedure(s) Performed: PLIF,IP,POSTERIOR INSTRUMENTATION, L23; EXPLORE FUSION     Patient location during evaluation: PACU Anesthesia Type: General Level of consciousness: awake and alert Pain management: pain level controlled Vital Signs Assessment: post-procedure vital signs reviewed and stable Respiratory status: spontaneous breathing, nonlabored ventilation, respiratory function stable and patient connected to nasal cannula oxygen Cardiovascular status: blood pressure returned to baseline and stable Postop Assessment: no apparent nausea or vomiting Anesthetic complications: no   No notable events documented.  Last Vitals:  Vitals:   09/22/22 1600 09/22/22 1630  BP: (!) 149/83 118/75  Pulse: 78 79  Resp: 16 16  Temp: 36.7 C 36.7 C  SpO2: 95% 100%    Last Pain:  Vitals:   09/22/22 1630  TempSrc: Oral  PainSc: 2                  March Rummage Daphnee Preiss

## 2022-09-22 NOTE — Anesthesia Procedure Notes (Signed)
Procedure Name: Intubation Date/Time: 09/22/2022 10:55 AM  Performed by: Dorthea Cove, CRNAPre-anesthesia Checklist: Patient identified, Emergency Drugs available, Suction available and Patient being monitored Patient Re-evaluated:Patient Re-evaluated prior to induction Oxygen Delivery Method: Circle system utilized Preoxygenation: Pre-oxygenation with 100% oxygen Induction Type: IV induction Ventilation: Mask ventilation without difficulty Laryngoscope Size: Mac and 4 Grade View: Grade I Tube type: Oral Tube size: 7.5 mm Number of attempts: 1 Airway Equipment and Method: Stylet and Oral airway Placement Confirmation: ETT inserted through vocal cords under direct vision, positive ETCO2 and breath sounds checked- equal and bilateral Secured at: 23 cm Tube secured with: Tape Dental Injury: Teeth and Oropharynx as per pre-operative assessment

## 2022-09-22 NOTE — H&P (Signed)
Subjective: The patient is a 76 year old white male on whom I previously performed lumbar fusions.  He has done well after surgery but has developed recurrent back and right leg pain consistent with lumbar radiculopathy/neurogenic claudication.  He has failed medical management and was worked up with a lumbar MRI which demonstrated lumbar adjacent disease with spondylolisthesis and spinal stenosis.  I discussed the various treatment options with him.  He has decided proceed with surgery.  Past Medical History:  Diagnosis Date   Alcohol abuse    Anxiety    Depression    Gait abnormality 04/08/2018   GERD (gastroesophageal reflux disease)    Hyperlipidemia    Hypertension    Kidney damage    Memory difficulties 04/08/2018    Past Surgical History:  Procedure Laterality Date   BACK SURGERY     COLONOSCOPY     no polyps   COLONOSCOPY W/ POLYPECTOMY     EYE SURGERY     KNEE SURGERY Left 2011   fracture   LASER PHOTO ABLATION Left 05/18/2018   Procedure: LASER PHOTO ABLATION;  Surgeon: Hayden Pedro, MD;  Location: [facility];  Service: Ophthalmology;  Laterality: Left;   LUMBAR WOUND DEBRIDEMENT N/A 08/26/2021   Procedure: LUMBAR WOUND DEBRIDEMENT;  Surgeon: Newman Pies, MD;  Location: [facility];  Service: Neurosurgery;  Laterality: N/A;   PARS PLANA VITRECTOMY Left 05/18/2018   Procedure: PARS PLANA VITRECTOMY WITH 25G REMOVAL/SUTURE INTRAOCULAR LENS;  Surgeon: Hayden Pedro, MD;  Location: [facility];  Service: Ophthalmology;  Laterality: Left;   VITRECTOMY Left 05/18/2018   PARS PLANA VITRECTOMY WITH 25G REMOVAL/SUTURE INTRAOCULAR LENS (Left)    Allergies  Allergen Reactions   Aricept [Donepezil Hcl] Diarrhea and Other (See Comments)    Diarrhea, stomach problems.    Atropine Swelling and Other (See Comments)    Eyes swollen and red   Other Other (See Comments)    Moisturizing eye drop - unknown    Social History   Tobacco Use   Smoking status: Former    Types: Cigarettes     Quit date: 1978    Years since quitting: 45.8   Smokeless tobacco: Never  Substance Use Topics   Alcohol use: Yes    Alcohol/week: 16.0 - 17.0 standard drinks of alcohol    Types: 2 - 3 Shots of liquor, 14 Standard drinks or equivalent per week    Comment: 8-10 ounces daily    Family History  Problem Relation Age of Onset   CAD Father    Kidney disease Father    Parkinson's disease Mother    Anesthesia problems Neg Hx    Broken bones Neg Hx    Cancer Neg Hx    Clotting disorder Neg Hx    Collagen disease Neg Hx    Diabetes Neg Hx    Dislocations Neg Hx    Osteoporosis Neg Hx    Rheumatologic disease Neg Hx    Scoliosis Neg Hx    Severe sprains Neg Hx    Prior to Admission medications   Medication Sig Start Date End Date Taking? Authorizing Provider  amLODipine-valsartan (EXFORGE) 10-160 MG tablet Take 1 tablet by mouth daily. 08/28/20  Yes [provider]  cefadroxil (DURICEF) 500 MG capsule Take 1 capsule (500 mg total) by mouth 2 (two) times daily. 04/10/22 04/05/23 Yes Vu, Rockey Situ, MD  docusate sodium (COLACE) 100 MG capsule Take 1 capsule (100 mg total) by mouth 2 (two) times daily. 08/15/21  Yes Viona Gilmore D,  NP  fenofibrate (TRICOR) 145 MG tablet Take 145 mg by mouth daily.   Yes [provider]  FLUoxetine (PROZAC) 20 MG capsule TAKE 1 CAPSULE BY MOUTH EVERY DAY 05/19/22  Yes Suzzanne Cloud, NP  folic acid (FOLVITE) 1 MG tablet Take 1 mg by mouth daily.   Yes [provider]  gabapentin (NEURONTIN) 300 MG capsule Take 300 mg by mouth every morning. 09/06/21  Yes [provider]  levothyroxine (SYNTHROID) 50 MCG tablet Take 50 mcg by mouth daily before breakfast. 04/09/20  Yes [provider]  memantine (NAMENDA XR) 28 MG CP24 24 hr capsule Take 1 capsule (28 mg total) by mouth daily. 04/24/22  Yes Suzzanne Cloud, NP  mirtazapine (REMERON) 30 MG tablet Take 1 tablet (30 mg total) by mouth at bedtime. Take 1 tablet at bedtime Patient  taking differently: Take 30 mg by mouth every morning. 04/24/22  Yes Suzzanne Cloud, NP  Multiple Vitamins-Minerals (MULTIVITAMIN WITH MINERALS) tablet Take 1 tablet by mouth daily.   Yes [provider]  oxyCODONE 10 MG TABS Take 1 tablet (10 mg total) by mouth every 3 (three) hours as needed for severe pain ((score 7 to 10)). 08/31/21  Yes Consuella Lose, MD  pantoprazole (PROTONIX) 40 MG tablet Take 40 mg by mouth 2 (two) times daily.   Yes [provider]  rosuvastatin (CRESTOR) 10 MG tablet Take 10 mg by mouth every morning. 12/28/13  Yes [provider]  sucralfate (CARAFATE) 1 g tablet Take 1 g by mouth 2 (two) times daily. 04/07/22  Yes [provider]  thiamine 100 MG tablet Take 100 mg by mouth daily.   Yes [provider]  traMADol (ULTRAM) 50 MG tablet Take 50 mg by mouth every 6 (six) hours as needed for moderate pain.   Yes [provider]  XTAMPZA ER 18 MG C12A Take 18 mg by mouth every 12 (twelve) hours. 09/12/21  Yes [provider]  Capsaicin-Menthol-Methyl Sal (CAPSAICIN-METHYL SAL-MENTHOL) 0.025-1-12 % CREA Apply 1 Application topically 3 (three) times daily as needed (for severe pain not released with pills). 06/19/22   Vu, Rockey Situ, MD     Review of Systems  Positive ROS: As above  All other systems have been reviewed and were otherwise negative with the exception of those mentioned in the HPI and as above.  Objective: Vital signs in last 24 hours: Temp:  [97.8 F (36.6 C)] 97.8 F (36.6 C) (10/09 0828) Pulse Rate:  [87] 87 (10/09 0828) Resp:  [18] 18 (10/09 0828) BP: (167)/(85) 167/85 (10/09 0828) SpO2:  [97 %] 97 % (10/09 0828) Weight:  [74.4 kg] 74.4 kg (10/09 0828) Estimated body mass index is 26.47 kg/m as calculated from the following:   Height as of this encounter: '5\' 6"'$  (1.676 m).   Weight as of this encounter: 74.4 kg.   General Appearance: Alert Head: Normocephalic, without obvious  abnormality, atraumatic Eyes: PERRL, conjunctiva/corneas clear, EOM's intact,    Ears: Normal  Throat: Normal  Neck: Supple, Back: His lumbar incision is well-healed. Lungs: Clear to auscultation bilaterally, respirations unlabored Heart: Regular rate and rhythm, no murmur, rub or gallop Abdomen: Soft, non-tender Extremities: Extremities normal, atraumatic, no cyanosis or edema Skin: unremarkable  NEUROLOGIC:   Mental status: alert and oriented,Motor Exam - grossly normal Sensory Exam - grossly normal Reflexes:  Coordination - grossly normal Gait - grossly normal Balance - grossly normal Cranial Nerves: I: smell Not tested  II: visual acuity  OS:  Normal  OD: Normal   II: visual fields Full to confrontation  II: pupils Equal, round, reactive to light  III,VII: ptosis None  III,IV,VI: extraocular muscles  Full ROM  V: mastication Normal  V: facial light touch sensation  Normal  V,VII: corneal reflex  Present  VII: facial muscle function - upper  Normal  VII: facial muscle function - lower Normal  VIII: hearing Not tested  IX: soft palate elevation  Normal  IX,X: gag reflex Present  XI: trapezius strength  5/5  XI: sternocleidomastoid strength 5/5  XI: neck flexion strength  5/5  XII: tongue strength  Normal    Data Review Lab Results  Component Value Date   WBC 4.5 09/12/2022   HGB 11.7 (L) 09/12/2022   HCT 35.1 (L) 09/12/2022   MCV 95.9 09/12/2022   PLT 180 09/12/2022   Lab Results  Component Value Date   NA 139 09/12/2022   K 4.8 09/12/2022   CL 112 (H) 09/12/2022   CO2 22 09/12/2022   BUN 30 (H) 09/12/2022   CREATININE 1.65 (H) 09/12/2022   GLUCOSE 106 (H) 09/12/2022   Lab Results  Component Value Date   INR 1.06 03/31/2018    Assessment/Plan: Lumbar adjacent disease with spondylolisthesis, lumbar spinal stenosis with neurogenic claudication, lumbago, lumbar radiculopathy: I have discussed the situation with the patient.  I reviewed his imaging  studies with him and pointed out the abnormalities.  We have discussed the various treatment options including surgery.  I have described the surgical treatment option of an exploration of his lumbar fusion with an L2-3 decompression, instrumentation and fusion.  I have shown him surgical models.  I have given him a surgical pamphlet.  We have discussed the risk, benefits, alternatives, expected postoperative course, and likelihood of achieving our goals with surgery.  I have answered all his questions.  He has decided to proceed with surgery.   Ophelia Charter 09/22/2022 9:49 AM

## 2022-09-22 NOTE — Anesthesia Preprocedure Evaluation (Signed)
Anesthesia Evaluation  Patient identified by MRN, date of birth, ID band Patient awake    Reviewed: Allergy & Precautions, NPO status , Patient's Chart, lab work & pertinent test results  Airway Mallampati: II  TM Distance: >3 FB Neck ROM: Full    Dental no notable dental hx.    Pulmonary neg pulmonary ROS, former smoker   Pulmonary exam normal        Cardiovascular hypertension, Pt. on medications  Rhythm:Regular Rate:Normal     Neuro/Psych  PSYCHIATRIC DISORDERS Anxiety Depression    negative neurological ROS     GI/Hepatic Neg liver ROS,GERD  Medicated,,  Endo/Other  negative endocrine ROS    Renal/GU Renal disease  negative genitourinary   Musculoskeletal  (+) Arthritis , Osteoarthritis,    Abdominal Normal abdominal exam  (+)   Peds  Hematology negative hematology ROS (+)   Anesthesia Other Findings   Reproductive/Obstetrics                             Anesthesia Physical Anesthesia Plan  ASA: 2  Anesthesia Plan: General   Post-op Pain Management:    Induction: Intravenous  PONV Risk Score and Plan: 2 and Ondansetron, Dexamethasone and Treatment may vary due to age or medical condition  Airway Management Planned: Mask and Oral ETT  Additional Equipment: None  Intra-op Plan:   Post-operative Plan: Extubation in OR  Informed Consent: I have reviewed the patients History and Physical, chart, labs and discussed the procedure including the risks, benefits and alternatives for the proposed anesthesia with the patient or authorized representative who has indicated his/her understanding and acceptance.     Dental advisory given  Plan Discussed with: CRNA  Anesthesia Plan Comments: (Lab Results      Component                Value               Date                      WBC                      4.5                 09/12/2022                HGB                      11.7 (L)             09/12/2022                HCT                      35.1 (L)            09/12/2022                MCV                      95.9                09/12/2022                PLT                      180  09/12/2022            Lab Results      Component                Value               Date                      NA                       139                 09/12/2022                K                        4.8                 09/12/2022                CO2                      22                  09/12/2022                GLUCOSE                  106 (H)             09/12/2022                BUN                      30 (H)              09/12/2022                CREATININE               1.65 (H)            09/12/2022                CALCIUM                  9.3                 09/12/2022                EGFR                     58 (L)              04/10/2022                GFRNONAA                 43 (L)              09/12/2022           )       Anesthesia Quick Evaluation

## 2022-09-22 NOTE — Transfer of Care (Signed)
Immediate Anesthesia Transfer of Care Note  Patient: Brandon Fleming  Procedure(s) Performed: PLIF,IP,POSTERIOR INSTRUMENTATION, L23; EXPLORE FUSION  Patient Location: PACU  Anesthesia Type:General  Level of Consciousness: awake, alert  and oriented  Airway & Oxygen Therapy: Patient Spontanous Breathing and Patient connected to nasal cannula oxygen  Post-op Assessment: Report given to RN and Post -op Vital signs reviewed and stable  Post vital signs: Reviewed and stable  Last Vitals:  Vitals Value Taken Time  BP 140/78 09/22/22 1452  Temp    Pulse 81 09/22/22 1453  Resp 13 09/22/22 1453  SpO2 96 % 09/22/22 1453  Vitals shown include unvalidated device data.  Last Pain:  Vitals:   09/22/22 0922  TempSrc:   PainSc: 0-No pain         Complications: No notable events documented.

## 2022-09-22 NOTE — Op Note (Signed)
Brief history: The patient is a 76 year old white male on whom I previously performed an L3-4 and L5 decompression, instrumentation and fusion.  He initially did well but has developed recurrent back and right greater left leg pain.  He has failed medical management and was worked up with a lumbar MRI which demonstrated lumbar adjacent disease with spondylolisthesis and spinal stenosis.  I discussed the various treatment options with him.  He has decided proceed with surgery.  Preoperative diagnosis: L2-3 adjacent segment disease with spondylolisthesis, degenerative disc disease, spinal stenosis compressing both the L2 and the L3 nerve roots; lumbago; lumbar radiculopathy; neurogenic claudication  Postoperative diagnosis: The same  Procedure: Bilateral L2-3 laminotomy/foraminotomies/medial facetectomy to decompress the bilateral L2 and L3 nerve roots(the work required to do this was in addition to the work required to do the posterior lumbar interbody fusion because of the patient's spinal stenosis, facet arthropathy. Etc. requiring a wide decompression of the nerve roots.);  Right L2-3 transforaminal lumbar interbody fusion with local morselized autograft bone and Zimmer DBM; insertion of interbody prosthesis at L2-3 (globus peek expandable interbody prosthesis); posterior segmental instrumentation from L2 to L5 with globus titanium pedicle screws and rods; posterior lateral arthrodesis at L2-3 with local morselized autograft bone and Zimmer DBM; exploration of lumbar fusion/removal of lumbar hardware.  Surgeon: Dr. Earle Gell  Asst.: Arnetha Massy, NP  Anesthesia: Gen. endotracheal  Estimated blood loss: 250 cc  Drains: None  Complications: None  Description of procedure: The patient was brought to the operating room by the anesthesia team. General endotracheal anesthesia was induced. The patient was turned to the prone position on the Wilson frame. The patient's lumbosacral region was then  prepared with Betadine scrub and Betadine solution. Sterile drapes were applied.  I then injected the area to be incised with Marcaine with epinephrine solution. I then used the scalpel to make a linear midline incision over the L2-3, L3-4 and L4-5 interspace. I then used electrocautery to perform a bilateral subperiosteal dissection exposing the spinous process and lamina of L2-3, L3-4 and L4-5 and exposing the old hardware at L3-4 and L4-5. We then inserted the Verstrac retractor to provide exposure.  We explored the arthrodesis by removing the caps from the old screws and then remove the rods.  We inspected the arthrodesis at L3-4 and L4-5.  It appeared solid.  I began the decompression by using the high speed drill to perform laminotomies at L2-3 bilaterally. We then used the Kerrison punches to widen the laminotomy and removed the ligamentum flavum at L2-3 bilaterally. We used the Kerrison punches to remove the medial facets at L2-3 bilaterally, we removed the right L2-3 facet. We performed wide foraminotomies about the bilateral L2 and L3 nerve roots completing the decompression.  We now turned our attention to the posterior lumbar interbody fusion. I used a scalpel to incise the intervertebral disc at L2-3 bilaterally. I then performed a partial intervertebral discectomy at L2-3 bilaterally using the pituitary forceps. We prepared the vertebral endplates at Z1-6 bilaterally for the fusion by removing the soft tissues with the curettes. We then used the trial spacers to pick the appropriate sized interbody prosthesis. We prefilled his prosthesis with a combination of local morselized autograft bone that we obtained during the decompression as well as Zimmer DBM. We inserted the prefilled prosthesis into the interspace at L2-3 from the right, we then turned and expanded the prosthesis. There was a good snug fit of the prosthesis in the interspace. We then filled and the  remainder of the intervertebral  disc space with local morselized autograft bone and Zimmer DBM. This completed the posterior lumbar interbody arthrodesis.  During the decompression and insertion of the prosthesis the assistant protected the thecal sac and nerve roots with the D'Errico retractor.  We now turned attention to the instrumentation. Under fluoroscopic guidance we cannulated the bilateral L2 pedicles with the bone probe. We then removed the bone probe. We then tapped the pedicle with a 6.5 millimeter tap. We then removed the tap. We probed inside the tapped pedicle with a ball probe to rule out cortical breaches. We then inserted a 7.5 x 55 millimeter pedicle screw into the L2 pedicles bilaterally under fluoroscopic guidance. We then palpated along the medial aspect of the pedicles to rule out cortical breaches. There were none. The nerve roots were not injured. We then connected the unilateral pedicle screws with a lordotic rod. We compressed the construct and secured the rod in place with the caps. We then tightened the caps appropriately. This completed the instrumentation from L2-L5 bilaterally.  We now turned our attention to the posterior lateral arthrodesis at L2-3. We used the high-speed drill to decorticate the remainder of the facets, pars, transverse process at L2-3. We then applied a combination of local morselized autograft bone and Zimmer DBM over these decorticated posterior lateral structures. This completed the posterior lateral arthrodesis.  We then obtained hemostasis using bipolar electrocautery. We irrigated the wound out with saline solution. We inspected the thecal sac and nerve roots and noted they were well decompressed. We then removed the retractor.  We injected Exparel . We reapproximated patient's thoracolumbar fascia with interrupted #1 Vicryl suture. We reapproximated patient's subcutaneous tissue with interrupted 2-0 Vicryl suture. The reapproximated patient's skin with Steri-Strips and benzoin. The  wound was then coated with bacitracin ointment. A sterile dressing was applied. The drapes were removed. The patient was subsequently returned to the supine position where they were extubated by the anesthesia team. He was then transported to the post anesthesia care unit in stable condition. All sponge instrument and needle counts were reportedly correct at the end of this case.

## 2022-09-23 MED ORDER — OXYCODONE-ACETAMINOPHEN 5-325 MG PO TABS: 1.0000 | ORAL_TABLET | ORAL | Status: DC | PRN

## 2022-09-23 MED ORDER — OXYCODONE-ACETAMINOPHEN 5-325 MG PO TABS: 1.0000 | ORAL_TABLET | ORAL | 0 refills | Status: DC | PRN

## 2022-09-23 MED FILL — Thrombin (Recombinant) For Soln 5000 Unit: CUTANEOUS | Qty: 5000 | Status: AC

## 2022-09-23 NOTE — Progress Notes (Signed)
Patient alert and oriented, surgical site dry no sign of infection. Patient void, d/c instruction explain and given to the patient and wife. All questions answer. Pt. D/c home per order.

## 2022-09-23 NOTE — Plan of Care (Signed)

## 2022-09-23 NOTE — Progress Notes (Signed)
PT Cancellation Note  Patient Details Name: Brandon Fleming MRN: [id number] DOB: [birth year]   Cancelled Treatment:    Reason Eval/Treat Not Completed: PT screened, no needs identified, will sign off  Patient did well with OT with expectation that he would not need PT session. Spoke with patient and he only has 1 step to enter home with rail. He was able to state correct sequencing with "weaker" vs "stronger" leg. He has chair lift for access to 2nd floor. No PT needs at this time. Eval deferred.    [place]  Office [phone]  Rexanne Mano 09/23/2022, 8:41 AM

## 2022-09-23 NOTE — Evaluation (Signed)
Occupational Therapy Evaluation Patient Details Name: Brandon Fleming MRN: [id number] DOB: [birth year] Today's Date: 09/23/2022   History of Present Illness 76 year old white male adm 09/22/22 for exploration of his prior lumbar fusion with an L2-3 decompression, instrumentation and fusion. PMHx significant for previous lumber surgeries L3-L5, anxiety/depression, HLD, HTN and memory loss.   Clinical Impression   Brandon Fleming was evaluated s/p the above back surgery, he is typically indep at baseline. Upon evaluation pt was limited by generalized weakness, mild sx pain, knowledge of back precautions and compensatory techniques. He required generalized supervision for all mobility and ADLs upon evaluation and cues to maintain back precautions with compensatory strategies. Pt does not have further acute OT needs. Recommend d/c home with support of family.      Recommendations for follow up therapy are one component of a multi-disciplinary discharge planning process, led by the attending physician.  Recommendations may be updated based on patient status, additional functional criteria and insurance authorization.   Follow Up Recommendations  No OT follow up    Assistance Recommended at Discharge Intermittent Supervision/Assistance  Patient can return home with the following A little help with walking and/or transfers;A little help with bathing/dressing/bathroom;Assist for transportation    Functional Status Assessment  Patient has had a recent decline in their functional status and demonstrates the ability to make significant improvements in function in a reasonable and predictable amount of time.  Equipment Recommendations  None recommended by OT       Precautions / Restrictions Precautions Precautions: Fall;Back Precaution Booklet Issued: Yes (comment) Required Braces or Orthoses: Spinal Brace Spinal Brace: Lumbar corset;Applied in sitting position Restrictions Weight Bearing Restrictions:  No      Mobility Bed Mobility Overal bed mobility: Needs Assistance Bed Mobility: Rolling, Sidelying to Sit Rolling: Supervision Sidelying to sit: Supervision            Transfers Overall transfer level: Needs assistance Equipment used: None Transfers: Sit to/from Stand Sit to Stand: Supervision                  Balance Overall balance assessment: No apparent balance deficits (not formally assessed)                                         ADL either performed or assessed with clinical judgement   ADL Overall ADL's : Needs assistance/impaired Eating/Feeding: Independent   Grooming: Supervision/safety;Cueing for compensatory techniques   Upper Body Bathing: Supervision/ safety;Cueing for compensatory techniques   Lower Body Bathing: Supervison/ safety;Cueing for compensatory techniques   Upper Body Dressing : Set up   Lower Body Dressing: Supervision/safety;Cueing for compensatory techniques   Toilet Transfer: Supervision/safety   Toileting- Clothing Manipulation and Hygiene: Supervision/safety;Cueing for compensatory techniques       Functional mobility during ADLs: Supervision/safety General ADL Comments: generalized supervision provided for safety and cues for compensatory techniques to maintain back precautions     Vision Baseline Vision/History: 1 Wears glasses Vision Assessment?: No apparent visual deficits     Perception Perception Perception: Not tested   Praxis Praxis Praxis: Not tested    Pertinent Vitals/Pain Pain Assessment Pain Assessment: Faces Faces Pain Scale: Hurts a little bit Pain Location: back Pain Descriptors / Indicators: Discomfort, Grimacing, Guarding Pain Intervention(s): Limited activity within patient's tolerance, Monitored during session     Hand Dominance Right   Extremity/Trunk Assessment Upper Extremity Assessment Upper Extremity Assessment: Overall [facility]  for tasks assessed   Lower  Extremity Assessment Lower Extremity Assessment: Generalized weakness   Cervical / Trunk Assessment Cervical / Trunk Assessment: Back Surgery   Communication Communication Communication: No difficulties   Cognition Arousal/Alertness: Awake/alert Behavior During Therapy: WFL for tasks assessed/performed Overall Cognitive Status: Within Functional Limits for tasks assessed                                       General Comments  VSS on RA     Home Living Family/patient expects to be discharged to:: Private residence Living Arrangements: Spouse/significant other Available Help at Discharge: Family;Available PRN/intermittently Type of Home: House Home Access: Stairs to enter Entrance Stairs-Number of Steps: 1 Entrance Stairs-Rails: Right Home Layout: Multi-level Alternate Level Stairs-Number of Steps: flight with chair lift   Bathroom Shower/Tub: Walk-in shower   Bathroom Toilet: Handicapped height     Home Equipment: Conservation officer, nature (2 wheels);Rollator (4 wheels);Cane - single point;Toilet riser          Prior Functioning/Environment Prior Level of Function : Independent/Modified Independent;Driving             Mobility Comments: no AD ADLs Comments: indep        OT Problem List: Decreased activity tolerance;Impaired balance (sitting and/or standing);Decreased knowledge of precautions      OT Treatment/Interventions:      OT Goals(Current goals can be found in the care plan section) Acute Rehab OT Goals Patient Stated Goal: home OT Goal Formulation: With patient Time For Goal Achievement: 10/07/22 Potential to Achieve Goals: Good   AM-PAC OT "6 Clicks" Daily Activity     Outcome Measure Help from another person eating meals?: None Help from another person taking care of personal grooming?: A Little Help from another person toileting, which includes using toliet, bedpan, or urinal?: A Little Help from another person bathing (including  washing, rinsing, drying)?: A Little Help from another person to put on and taking off regular upper body clothing?: A Little Help from another person to put on and taking off regular lower body clothing?: A Little 6 Click Score: 19   End of Session Equipment Utilized During Treatment: Back brace Nurse Communication: Mobility status (dressing change)  Activity Tolerance: Patient tolerated treatment well Patient left: in bed;with call bell/phone within reach  OT Visit Diagnosis: Other abnormalities of gait and mobility (R26.89);Pain                Time: 9381-0175 OT Time Calculation (min): 22 min Charges:  OT General Charges $OT Visit: 1 Visit OT Evaluation $OT Eval Low Complexity: 1 Low  Corean Yoshimura D Causey 09/23/2022, 8:58 AM

## 2022-09-23 NOTE — Discharge Summary (Signed)
Physician Discharge Summary  Patient ID: Brandon Fleming MRN: [id number] DOB/AGE: [birth year] 76 y.o.  Admit date: 09/22/2022 Discharge date: 09/23/2022  Admission Diagnoses: Lumbar adjacent segment disease with spondylolisthesis, lumbar spinal stenosis with neurogenic claudication, lumbago, lumbar radiculopathy  Discharge Diagnoses: The same Principal Problem:   Lumbar adjacent segment disease with spondylolisthesis   Discharged Condition: good  Hospital Course: I performed extension of the patient's lumbar fusion to L2-3 on 09/22/2022.  The surgery went well.  The patient's postoperative course was unremarkable.  On postoperative day #1 he felt well and requested discharge to home.  He was given verbal and written discharge instructions.  All his questions were answered.  Consults: PT, OT, care management Significant Diagnostic Studies: None Treatments: Exploration of lumbar fusion, L2-3 decompression, instrumentation and fusion. Discharge Exam: Blood pressure 136/74, pulse 73, temperature 98.3 F (36.8 C), temperature source Oral, resp. rate 20, height '5\' 6"'$  (1.676 m), weight 74.4 kg, SpO2 98 %. The patient is alert and pleasant.  He looks well.  His strength is normal.  His dressing has been reinforced.  Disposition: Home  Discharge Instructions     Call MD for:  difficulty breathing, headache or visual disturbances   Complete by: As directed    Call MD for:  extreme fatigue   Complete by: As directed    Call MD for:  hives   Complete by: As directed    Call MD for:  persistant dizziness or light-headedness   Complete by: As directed    Call MD for:  persistant nausea and vomiting   Complete by: As directed    Call MD for:  redness, tenderness, or signs of infection (pain, swelling, redness, odor or green/yellow discharge around incision site)   Complete by: As directed    Call MD for:  severe uncontrolled pain   Complete by: As directed    Call MD for:  temperature  >100.4   Complete by: As directed    Diet - low sodium heart healthy   Complete by: As directed    Discharge instructions   Complete by: As directed    Call [phone] for a followup appointment. Take a stool softener while you are using pain medications.   Driving Restrictions   Complete by: As directed    Do not drive for 2 weeks.   Increase activity slowly   Complete by: As directed    Lifting restrictions   Complete by: As directed    Do not lift more than 5 pounds. No excessive bending or twisting.   May shower / Bathe   Complete by: As directed    Remove the dressing for 3 days after surgery.  You may shower, but leave the incision alone.   Remove dressing in 48 hours   Complete by: As directed       Allergies as of 09/23/2022       Reactions   Aricept [donepezil Hcl] Diarrhea, Other (See Comments)   Diarrhea, stomach problems.    Atropine Swelling, Other (See Comments)   Eyes swollen and red   Other Other (See Comments)   Moisturizing eye drop - unknown        Medication List     STOP taking these medications    cefadroxil 500 MG capsule Commonly known as: DURICEF   Oxycodone HCl 10 MG Tabs   traMADol 50 MG tablet Commonly known as: ULTRAM       TAKE these medications    amLODipine-valsartan 10-160 MG tablet  Commonly known as: EXFORGE Take 1 tablet by mouth daily.   capsaicin-methyl sal-menthol 0.025-1-12 % Crea Generic drug: Capsaicin-Menthol-Methyl Sal Apply 1 Application topically 3 (three) times daily as needed (for severe pain not released with pills).   docusate sodium 100 MG capsule Commonly known as: COLACE Take 1 capsule (100 mg total) by mouth 2 (two) times daily.   fenofibrate 145 MG tablet Commonly known as: TRICOR Take 145 mg by mouth daily.   FLUoxetine 20 MG capsule Commonly known as: PROZAC TAKE 1 CAPSULE BY MOUTH EVERY DAY   folic acid 1 MG tablet Commonly known as: FOLVITE Take 1 mg by mouth daily.   gabapentin  300 MG capsule Commonly known as: NEURONTIN Take 300 mg by mouth every morning.   levothyroxine 50 MCG tablet Commonly known as: SYNTHROID Take 50 mcg by mouth daily before breakfast.   memantine 28 MG Cp24 24 hr capsule Commonly known as: NAMENDA XR Take 1 capsule (28 mg total) by mouth daily.   mirtazapine 30 MG tablet Commonly known as: REMERON Take 1 tablet (30 mg total) by mouth at bedtime. Take 1 tablet at bedtime What changed:  when to take this additional instructions   multivitamin with minerals tablet Take 1 tablet by mouth daily.   oxyCODONE-acetaminophen 5-325 MG tablet Commonly known as: PERCOCET/ROXICET Take 1-2 tablets by mouth every 4 (four) hours as needed for moderate pain.   pantoprazole 40 MG tablet Commonly known as: PROTONIX Take 40 mg by mouth 2 (two) times daily.   rosuvastatin 10 MG tablet Commonly known as: CRESTOR Take 10 mg by mouth every morning.   sucralfate 1 g tablet Commonly known as: CARAFATE Take 1 g by mouth 2 (two) times daily.   thiamine 100 MG tablet Commonly known as: VITAMIN B1 Take 100 mg by mouth daily.   Xtampza ER 18 MG C12a Generic drug: oxyCODONE ER Take 18 mg by mouth every 12 (twelve) hours.         Signed: Ophelia Charter 09/23/2022, 7:50 AM

## 2022-09-23 NOTE — Progress Notes (Signed)
Orthopedic Tech Progress Note Patient Details:  JESSIE SCHRIEBER Jan 18, 1946 [id number]  Patient ID: [name], male   DOB: 08/12/1946, 76 y.o.   MRN: [id number] Patient already has their brace. Karolee Stamps 09/23/2022, 5:57 AM

## 2022-09-24 MED FILL — Sodium Chloride IV Soln 0.9%: INTRAVENOUS | Qty: 1000 | Status: AC

## 2022-09-24 MED FILL — Heparin Sodium (Porcine) Inj 1000 Unit/ML: INTRAMUSCULAR | Qty: 30 | Status: AC

## 2022-09-25 LAB — TYPE AND SCREEN
ABO/RH(D): A NEG
Antibody Screen: POSITIVE
Donor AG Type: NEGATIVE
Donor AG Type: NEGATIVE
PT AG Type: NEGATIVE
Unit division: 0
Unit division: 0

## 2022-09-25 LAB — BPAM RBC
Blood Product Expiration Date: 202310192359
Blood Product Expiration Date: 202311062359
Unit Type and Rh: 600
Unit Type and Rh: 9500

## 2022-10-01 ENCOUNTER — Other Ambulatory Visit (HOSPITAL_BASED_OUTPATIENT_CLINIC_OR_DEPARTMENT_OTHER): Payer: Self-pay

## 2022-10-01 MED ORDER — INFLUENZA VAC A&B SA ADJ QUAD 0.5 ML IM PRSY
PREFILLED_SYRINGE | INTRAMUSCULAR | 0 refills | Status: AC
  Filled 2022-10-01: qty 0.5, 1d supply, fill #0

## 2022-10-01 MED ORDER — COMIRNATY 30 MCG/0.3ML IM SUSY
PREFILLED_SYRINGE | INTRAMUSCULAR | 0 refills | Status: AC
  Filled 2022-10-01: qty 0.3, 1d supply, fill #0

## 2022-10-13 DIAGNOSIS — S0011XA Contusion of right eyelid and periocular area, initial encounter: Secondary | ICD-10-CM | POA: Diagnosis not present

## 2022-10-14 DIAGNOSIS — M48061 Spinal stenosis, lumbar region without neurogenic claudication: Secondary | ICD-10-CM | POA: Diagnosis not present

## 2022-10-14 DIAGNOSIS — M5136 Other intervertebral disc degeneration, lumbar region: Secondary | ICD-10-CM | POA: Diagnosis not present

## 2022-10-16 ENCOUNTER — Encounter: Payer: Self-pay | Admitting: Neurology

## 2022-10-27 ENCOUNTER — Ambulatory Visit (INDEPENDENT_AMBULATORY_CARE_PROVIDER_SITE_OTHER): Payer: Medicare Other | Admitting: Physician Assistant

## 2022-10-27 ENCOUNTER — Encounter: Payer: Self-pay | Admitting: Physician Assistant

## 2022-10-27 ENCOUNTER — Ambulatory Visit (INDEPENDENT_AMBULATORY_CARE_PROVIDER_SITE_OTHER): Payer: Medicare Other

## 2022-10-27 ENCOUNTER — Other Ambulatory Visit (HOSPITAL_BASED_OUTPATIENT_CLINIC_OR_DEPARTMENT_OTHER): Payer: Self-pay

## 2022-10-27 DIAGNOSIS — M25511 Pain in right shoulder: Secondary | ICD-10-CM | POA: Diagnosis not present

## 2022-10-27 DIAGNOSIS — M25531 Pain in right wrist: Secondary | ICD-10-CM

## 2022-10-27 MED ORDER — AREXVY 120 MCG/0.5ML IM SUSR
INTRAMUSCULAR | 0 refills | Status: AC
  Filled 2022-10-27: qty 0.5, 1d supply, fill #0

## 2022-10-27 NOTE — Progress Notes (Signed)
Office Visit Note   Patient: Brandon Fleming           Date of Birth: April 05, 1946           MRN: [id number] Visit Date: 10/27/2022              Requested by: Prince Solian, MD [id number] [place],  [place] [address] PCP: Prince Solian, MD  Chief Complaint  Patient presents with   Right Shoulder - Pain   Right Wrist - Pain      HPI: Mr. Holtsclaw comes in today with a chief complaint of right wrist pain and right shoulder pain.  He fell about 4 weeks ago in a parking lot.  He denies any other injuries.  The shoulder did get better but he continued to have pain more significant in the wrist.  It was significant enough that he did order a wrist splint and is wearing it today.  He does feel this has been somewhat helpful.  Denies any previous history of shoulder or wrist injuries.  He is right-hand dominant  Assessment & Plan: Visit Diagnoses:  1. Acute pain of right shoulder   2. Pain in right wrist     Plan: Right wrist pain.  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  He is not really tender at all to palpation in this area.  He points to where he hurts is over the carpal metacarpal joint.  Cannot really reproduce his pain except with resisted extension of his wrist with just reproduces a small amount of pain no real pain with ulnar or radial deviation.  No pain in the elbow.  No increase in pain with supination pronation.  X-rays do not show any acute fracture.  Does not rule out a ligamentous or tendon injury such as a TFCC tear.  Had a discussion with the patient and his wife.  We certainly could get an MRI to confirm this.  Secondly since the wrist splint seems to be helping him a little bit he has not worn it that long he could continue to use that for the next couple weeks.  Dr. Durward Fortes also offered to inject his wrist and he will consider this.  With regards to his shoulder I did offer him an injection today.  He feels like his shoulder is gotten a  lot better and also declined this but again this could be revisited.  He does have good strength in his shoulder though slight decrease in motion behind his back has a negative drop arm test  Follow-Up Instructions: No follow-ups on file.   Ortho Exam  Patient is alert, oriented, no adenopathy, well-dressed, normal affect, normal respiratory effort. Right wrist he has easily palpable radial ulnar pulses.  Fingers are warm and pink with brisk capillary refill.  He can oppose all of his fingers though opposing the thumb is a little bit stiff.  He has no tenderness over the [facility] joint.  No tenderness to deep palpation over the wrist.  Cannot really reproduce much pain with ulnar or radial deviation.  Has a little bit of pain with resisted extension of his wrist.  Sensation is intact  Imaging: XR Shoulder Right  Result Date: 10/27/2022 Three-view radiographs of the right shoulder were reviewed today.  Humeral head is well reduced in the glenoid fossa no obvious fracture of the humerus.  He does have some hypertrophic bone formation and degenerative changes of the [facility] joint as well as some  degenerative changes of the glenohumeral joint.  XR Wrist Complete Right  Result Date: 10/27/2022 Radiographs of his right wrist were obtained in multiple projections.  He has severe degenerative changes with subluxation at the first [facility] joint.  No evidence of any fracture of the radius or ulna well-maintained alignment no dissociation of the scaphoid lunate  No images are attached to the encounter.  Labs: Lab Results  Component Value Date   HGBA1C 4.8 08/27/2021   ESRSEDRATE 67 (H) 08/27/2021   CRP 2.6 04/10/2022   CRP 14.2 (H) 02/11/2022   CRP 6.5 11/01/2021   REPTSTATUS 09/01/2021 FINAL 08/27/2021   REPTSTATUS 09/01/2021 FINAL 08/27/2021   GRAMSTAIN  08/26/2021    FEW SQUAMOUS EPITHELIAL CELLS PRESENT FEW WBC SEEN MODERATE GRAM POSITIVE COCCI    CULT  08/27/2021    NO GROWTH 5 DAYS Performed at  [facility], [place] [address], [place], [place] [address]    CULT  08/27/2021    NO GROWTH 5 DAYS Performed at [facility] [address] [place], [place] [address]    LABORGA STAPHYLOCOCCUS AUREUS 08/26/2021     Lab Results  Component Value Date   ALBUMIN 3.1 (L) 09/12/2022   ALBUMIN 4.1 04/23/2018   ALBUMIN 3.8 03/31/2018    Lab Results  Component Value Date   MG 2.9 (H) 02/28/2018   MG 1.5 (L) 02/27/2018   No results found for: "VD25OH"  No results found for: "PREALBUMIN"    Latest Ref Rng & Units 09/12/2022    9:40 AM 04/10/2022   12:21 PM 02/11/2022    9:32 AM  CBC EXTENDED  WBC 4.0 - 10.5 K/uL 4.5  3.5  4.1   RBC 4.22 - 5.81 MIL/uL 3.66  3.86  4.29   Hemoglobin 13.0 - 17.0 g/dL 11.7  12.1  12.6   HCT 39.0 - 52.0 % 35.1  36.0  38.4   Platelets 150 - 400 K/uL 180  199  201   NEUT# 1,500 - 7,800 cells/uL   2,788   Lymph# 850 - 3,900 cells/uL   738      There is no height or weight on file to calculate BMI.  Orders:  Orders Placed This Encounter  Procedures   XR Shoulder Right   XR Wrist Complete Right   No orders of the defined types were placed in this encounter.    Procedures: No procedures performed  Clinical Data: No additional findings.  ROS:  All other systems negative, except as noted in the HPI. Review of Systems  Objective: Vital Signs: There were no vitals taken for this visit.  Specialty Comments:  No specialty comments available.  PMFS History: Patient Active Problem List   Diagnosis Date Noted   Pain in right shoulder 10/27/2022   Pain in right wrist 10/27/2022   Wound infection complicating hardware ([place]) 08/26/2021   Postoperative complication of skin involving drainage from surgical wound 08/26/2021   Lumbar adjacent segment disease with spondylolisthesis 08/14/2021   Insomnia 04/19/2020   Dislocated intraocular lens 05/18/2018   Dislocated intraocular lens, sequela 05/05/2018   Spondylolisthesis of lumbar  region 05/05/2018   Memory difficulties 04/08/2018   Gait abnormality 04/08/2018   Alcohol withdrawal ([place]) 02/28/2018   Weakness 02/26/2018   Essential hypertension 02/26/2018   HLD (hyperlipidemia) 02/26/2018   Tremors of nervous system 02/26/2018   Alcohol abuse 02/26/2018   Weakness generalized 02/26/2018   Past Medical History:  Diagnosis Date   Alcohol abuse    Anxiety  Depression    Gait abnormality 04/08/2018   GERD (gastroesophageal reflux disease)    Hyperlipidemia    Hypertension    Kidney damage    Memory difficulties 04/08/2018    Family History  Problem Relation Age of Onset   CAD Father    Kidney disease Father    Parkinson's disease Mother    Anesthesia problems Neg Hx    Broken bones Neg Hx    Cancer Neg Hx    Clotting disorder Neg Hx    Collagen disease Neg Hx    Diabetes Neg Hx    Dislocations Neg Hx    Osteoporosis Neg Hx    Rheumatologic disease Neg Hx    Scoliosis Neg Hx    Severe sprains Neg Hx     Past Surgical History:  Procedure Laterality Date   BACK SURGERY     COLONOSCOPY     no polyps   COLONOSCOPY W/ POLYPECTOMY     EYE SURGERY     KNEE SURGERY Left 2011   fracture   LASER PHOTO ABLATION Left 05/18/2018   Procedure: LASER PHOTO ABLATION;  Surgeon: Hayden Pedro, MD;  Location: [facility];  Service: Ophthalmology;  Laterality: Left;   LUMBAR WOUND DEBRIDEMENT N/A 08/26/2021   Procedure: LUMBAR WOUND DEBRIDEMENT;  Surgeon: Newman Pies, MD;  Location: [facility];  Service: Neurosurgery;  Laterality: N/A;   PARS PLANA VITRECTOMY Left 05/18/2018   Procedure: PARS PLANA VITRECTOMY WITH 25G REMOVAL/SUTURE INTRAOCULAR LENS;  Surgeon: Hayden Pedro, MD;  Location: [facility];  Service: Ophthalmology;  Laterality: Left;   VITRECTOMY Left 05/18/2018   PARS PLANA VITRECTOMY WITH 25G REMOVAL/SUTURE INTRAOCULAR LENS (Left)   Social History   Occupational History   Occupation: Retired  Tobacco Use   Smoking status: Former    Types:  Cigarettes    Quit date: 1978    Years since quitting: 45.8   Smokeless tobacco: Never  Vaping Use   Vaping Use: Never used  Substance and Sexual Activity   Alcohol use: Yes    Alcohol/week: 16.0 - 17.0 standard drinks of alcohol    Types: 2 - 3 Shots of liquor, 14 Standard drinks or equivalent per week    Comment: 8-10 ounces daily   Drug use: No   Sexual activity: Not on file

## 2022-11-11 ENCOUNTER — Ambulatory Visit (INDEPENDENT_AMBULATORY_CARE_PROVIDER_SITE_OTHER): Payer: Medicare Other | Admitting: Orthopaedic Surgery

## 2022-11-11 ENCOUNTER — Encounter: Payer: Self-pay | Admitting: Orthopaedic Surgery

## 2022-11-11 DIAGNOSIS — H838X3 Other specified diseases of inner ear, bilateral: Secondary | ICD-10-CM | POA: Diagnosis not present

## 2022-11-11 DIAGNOSIS — H903 Sensorineural hearing loss, bilateral: Secondary | ICD-10-CM | POA: Diagnosis not present

## 2022-11-11 DIAGNOSIS — M25531 Pain in right wrist: Secondary | ICD-10-CM | POA: Diagnosis not present

## 2022-11-11 DIAGNOSIS — M25511 Pain in right shoulder: Secondary | ICD-10-CM | POA: Diagnosis not present

## 2022-11-11 MED ORDER — LIDOCAINE HCL 2 % IJ SOLN
2.0000 mL | INTRAMUSCULAR | Status: AC | PRN
  Administered 2022-11-11: 2 mL

## 2022-11-11 MED ORDER — BUPIVACAINE HCL 0.25 % IJ SOLN
2.0000 mL | INTRAMUSCULAR | Status: AC | PRN
  Administered 2022-11-11: 2 mL via INTRA_ARTICULAR

## 2022-11-11 MED ORDER — METHYLPREDNISOLONE ACETATE 40 MG/ML IJ SUSP
80.0000 mg | INTRAMUSCULAR | Status: AC | PRN
  Administered 2022-11-11: 80 mg via INTRA_ARTICULAR

## 2022-11-11 NOTE — Progress Notes (Signed)
Office Visit Note   Patient: Brandon Fleming           Date of Birth: 1946/01/12           MRN: [id number] Visit Date: 11/11/2022              Requested by: Prince Solian, MD [address] [place],  [place] [address] PCP: Prince Solian, MD   Assessment & Plan: Visit Diagnoses:  1. Acute pain of right shoulder   2. Pain in right wrist     Plan: Mr. Jurney recently had a fall and injured his right wrist.  X-rays demonstrated significant degenerative changes about the base of the thumb but no obvious fracture.  He wore a splint for period time and notes that his definitely better.  He is not having any numbness or tingling or significant pain but certainly has some loss of function related to the arthritis.  His biggest problem now is with the right shoulder.  He has had exacerbation of this pain after the fall.  He has difficulty raising his arm overhead but tickly when reaching up for objects and sometimes has trouble sleeping at night.  His prior films demonstrate a little superior elevation of the humeral head with some degenerative changes at the [facility] joint and.  I also thought there was a little narrowing of the space between the humeral head and the acromion.  I suspect he may have a rotator cuff tear and discussed the potential issues with both the diagnosis and treatment.  He actually would like to try a cortisone injection before he proceeds with any further diagnostic testing.  I injected the subacromial space with cortisone today and we will plan to see him back in the office next several weeks if no improvement or he will call and we will set up an MRI scan of the right shoulder  Follow-Up Instructions: Return if symptoms worsen or fail to improve.   Orders:  No orders of the defined types were placed in this encounter.  No orders of the defined types were placed in this encounter.     Procedures: Large Joint Inj: R subacromial bursa on 11/11/2022 4:21 PM Indications:  diagnostic evaluation and pain Details: 25 G 1.5 in needle, anterolateral approach  Arthrogram: No  Medications: 2 mL lidocaine 2 %; 80 mg methylPREDNISolone acetate 40 MG/ML; 2 mL bupivacaine 0.25 % Consent was given by the patient. Immediately prior to procedure a time out was called to verify the correct patient, procedure, equipment, support staff and site/side marked as required. Patient was prepped and draped in the usual sterile fashion.       Clinical Data: No additional findings.   Subjective: Chief Complaint  Patient presents with   Right Shoulder - Pain, Follow-up   Right Wrist - Pain, Follow-up  Right wrist is feeling considerably better after the fall but has had an exacerbation of a chronic problem with his right shoulder.  He is having difficulty raising his arm over his head and reaching for objects.  Also having trouble sleeping at night with "aching" HPI  Review of Systems   Objective: Vital Signs: There were no vitals taken for this visit.  Physical Exam Constitutional:      Appearance: He is well-developed.  Eyes:     Pupils: Pupils are equal, round, and reactive to light.  Pulmonary:     Effort: Pulmonary effort is normal.  Skin:    General: Skin is warm and dry.  Neurological:     Mental Status: He is alert and oriented to person, place, and time.  Psychiatric:        Behavior: Behavior normal.     Ortho Exam awake alert and oriented x3.  Comfortable sitting.  Able to raise his right arm overhead and quickly but with some discomfort.  Positive impingement and minimally positive empty can testing.  There was some mild tenderness along the anterior subacromial region.  Good abduction.  Good grip and release.  Does have arthritis at the base of the thumb as previously identified with some subluxation.  Skin intact.  No crepitation  Specialty Comments:  No specialty comments available.  Imaging: No results found.   PMFS History: Patient Active  Problem List   Diagnosis Date Noted   Pain in right shoulder 10/27/2022   Pain in right wrist 10/27/2022   Wound infection complicating hardware ([place]) 08/26/2021   Postoperative complication of skin involving drainage from surgical wound 08/26/2021   Lumbar adjacent segment disease with spondylolisthesis 08/14/2021   Insomnia 04/19/2020   Dislocated intraocular lens 05/18/2018   Dislocated intraocular lens, sequela 05/05/2018   Spondylolisthesis of lumbar region 05/05/2018   Memory difficulties 04/08/2018   Gait abnormality 04/08/2018   Alcohol withdrawal ([place]) 02/28/2018   Weakness 02/26/2018   Essential hypertension 02/26/2018   HLD (hyperlipidemia) 02/26/2018   Tremors of nervous system 02/26/2018   Alcohol abuse 02/26/2018   Weakness generalized 02/26/2018   Past Medical History:  Diagnosis Date   Alcohol abuse    Anxiety    Depression    Gait abnormality 04/08/2018   GERD (gastroesophageal reflux disease)    Hyperlipidemia    Hypertension    Kidney damage    Memory difficulties 04/08/2018    Family History  Problem Relation Age of Onset   CAD Father    Kidney disease Father    Parkinson's disease Mother    Anesthesia problems Neg Hx    Broken bones Neg Hx    Cancer Neg Hx    Clotting disorder Neg Hx    Collagen disease Neg Hx    Diabetes Neg Hx    Dislocations Neg Hx    Osteoporosis Neg Hx    Rheumatologic disease Neg Hx    Scoliosis Neg Hx    Severe sprains Neg Hx     Past Surgical History:  Procedure Laterality Date   BACK SURGERY     COLONOSCOPY     no polyps   COLONOSCOPY W/ POLYPECTOMY     EYE SURGERY     KNEE SURGERY Left 2011   fracture   LASER PHOTO ABLATION Left 05/18/2018   Procedure: LASER PHOTO ABLATION;  Surgeon: Hayden Pedro, MD;  Location: [place];  Service: Ophthalmology;  Laterality: Left;   LUMBAR WOUND DEBRIDEMENT N/A 08/26/2021   Procedure: LUMBAR WOUND DEBRIDEMENT;  Surgeon: Newman Pies, MD;  Location: [place];  Service:  Neurosurgery;  Laterality: N/A;   PARS PLANA VITRECTOMY Left 05/18/2018   Procedure: PARS PLANA VITRECTOMY WITH 25G REMOVAL/SUTURE INTRAOCULAR LENS;  Surgeon: Hayden Pedro, MD;  Location: [facility];  Service: Ophthalmology;  Laterality: Left;   VITRECTOMY Left 05/18/2018   PARS PLANA VITRECTOMY WITH 25G REMOVAL/SUTURE INTRAOCULAR LENS (Left)   Social History   Occupational History   Occupation: Retired  Tobacco Use   Smoking status: Former    Types: Cigarettes    Quit date: 1978    Years since quitting: 45.9   Smokeless tobacco: Never  Vaping Use  Vaping Use: Never used  Substance and Sexual Activity   Alcohol use: Yes    Alcohol/week: 16.0 - 17.0 standard drinks of alcohol    Types: 2 - 3 Shots of liquor, 14 Standard drinks or equivalent per week    Comment: 8-10 ounces daily   Drug use: No   Sexual activity: Not on file     Garald Balding, MD   Note - This record has been created using [employer].  Chart creation errors have been sought, but may not always  have been located. Such creation errors do not reflect on  the standard of medical care.

## 2022-12-11 ENCOUNTER — Encounter: Payer: Self-pay | Admitting: *Deleted

## 2022-12-16 ENCOUNTER — Encounter: Payer: Self-pay | Admitting: Neurology

## 2022-12-16 ENCOUNTER — Ambulatory Visit (INDEPENDENT_AMBULATORY_CARE_PROVIDER_SITE_OTHER): Payer: Medicare Other | Admitting: Neurology

## 2022-12-16 VITALS — BP 145/81 | HR 76 | Ht 66.0 in | Wt 173.8 lb

## 2022-12-16 DIAGNOSIS — G479 Sleep disorder, unspecified: Secondary | ICD-10-CM

## 2022-12-16 DIAGNOSIS — R0683 Snoring: Secondary | ICD-10-CM

## 2022-12-16 DIAGNOSIS — Z9189 Other specified personal risk factors, not elsewhere classified: Secondary | ICD-10-CM

## 2022-12-16 DIAGNOSIS — Z789 Other specified health status: Secondary | ICD-10-CM | POA: Diagnosis not present

## 2022-12-16 DIAGNOSIS — G2581 Restless legs syndrome: Secondary | ICD-10-CM

## 2022-12-16 DIAGNOSIS — R296 Repeated falls: Secondary | ICD-10-CM | POA: Diagnosis not present

## 2022-12-16 NOTE — Progress Notes (Signed)
Subjective:    Patient ID: Brandon Fleming is a 77 y.o. male.  HPI    Star Age, MD, PhD [facility] [address] [place], [place] [address]  Dear Dr. Arnoldo Morale,  I saw your patient, Brandon Fleming, upon your kind request in my neurologic clinic today for evaluation of his restless leg syndrome.  The patient is accompanied by his wife today.  As you know, Mr. [name] is a 77 year old male with an underlying medical history of memory loss (followed in our clinic previously by Dr. Jannifer Franklin and most recently seen by nurse practitioner Butler Denmark in May 2023), history of alcohol use disorder, anxiety, depression, gait disorder, hypertension, hyperlipidemia, reflux disease, lumbar degenerative disc disease with status post surgery in October 2023, who reports history of restless leg symptoms including aching sensation in his legs.  Symptoms often start in the morning, late morning around 11 AM and consist of aching sensation from his distal thighs down to the legs.  Symptoms are different from his previous back pain and are not like his sciatica pain.  The pain is bearable, he has more difficulty at night.  He does not sleep well.  He does snore per wife but she often does not sleep with him, they have separate bedrooms and only share a bedroom on vacation.  A few times a month he cannot sleep through the night.  Of note, he takes several high risk medications including gabapentin 300 mg twice daily, Remeron 30 mg at bedtime, Xtampza ER 18 mg daily, oxycodone as needed, he has not taken it regularly.  He also takes tramadol as needed.  Of note, he drinks alcohol daily, about 3 drinks of liquor per day.  His wife reports that he has stumbled several times and also fallen, thankfully without any major injuries.  He does not use a cane or walker but has both.  He does not hydrate well with water per wife, he estimates that he drinks up to 2 bottles of water per  day, she believes it is less.  He does not eat on a regular basis, typically only eats a full meal at dinner.   I reviewed your office note from 10/14/2022.  He has been on gabapentin 300 mg strength.  He was advised to increase his gabapentin from 300 mg daily to 300 mg twice daily.  I reviewed his office visit notes from Butler Denmark, NP and the last office visit note from Dr. Jannifer Franklin.  I copied the notes below for reference.  Previously:  04/24/2022 (SS): <<[name] is here today for follow-up.  He refused memory testing. Weight is up 2 lbs. Had surgery with Dr. Arnoldo Morale 08/14/21 L3-4 with hardware revision, got staph infection, required prolonged IV antibiotics. Working with ID. Dealing with pain, balances worsens in afternoon, uses walker. Is still driving. Doing better to take his medications, from [place]: Remeron, Prozac, Namenda. Still asking repetitive questioning, but memory is stable. Has hearing aids, doesn't wear them. Still going to the beach to visit grand kids. Isn't sleeping at night due to pain, does drink bourbon every night. >>   04/19/19 (Dr. Jannifer Franklin): <<Virtual Visit via Video Note   I connected with [name] on 04/19/19 at 11:30 AM EDT by a video enabled telemedicine application and verified that I am speaking with the correct person using two identifiers.   Location: Patient: The patient is in [facility] Provider: Physician in office.   I discussed the  limitations of evaluation and management by telemedicine and the availability of in person appointments. The patient expressed understanding and agreed to proceed.   History of Present Illness: [name] is a 77 year old right-handed white male with a history of alcohol overuse who has a mild memory disturbance and a gait disturbance.  The patient is on Namenda taking 10 mg twice daily, he is tolerating this well.  He did not tolerate Aricept secondary to diarrhea.  He has had a reduction in appetite, he has lost  about 10 pounds over the last 6 months, he usually eats only once a day.  He has become very inactive, he has not been walking much since his low back surgery.  He fell about a month ago when he slipped out of his shoes and fell down face forward, he did not go to the hospital.  He has a cane but he does not use this.  He has not been sleeping well at night, he is having bad dreams and he is up and down all night long.  The patient is still drinking about 3-4 alcoholic beverages daily.  The memory has progressed slightly, he is repeating things more frequently, he has to take notes in order remember to do things.  He continues to do his usual activities daily living, he does drive a car.>>  His Past Medical History Is Significant For: Past Medical History:  Diagnosis Date   Alcohol abuse    Anxiety    Depression    Gait abnormality 04/08/2018   GERD (gastroesophageal reflux disease)    Hyperlipidemia    Hypertension    Kidney damage    Memory difficulties 04/08/2018    His Past Surgical History Is Significant For: Past Surgical History:  Procedure Laterality Date   BACK SURGERY     COLONOSCOPY     no polyps   COLONOSCOPY W/ POLYPECTOMY     EYE SURGERY     KNEE SURGERY Left 2011   fracture   LASER PHOTO ABLATION Left 05/18/2018   Procedure: LASER PHOTO ABLATION;  Surgeon: Hayden Pedro, MD;  Location: [place];  Service: Ophthalmology;  Laterality: Left;   LUMBAR WOUND DEBRIDEMENT N/A 08/26/2021   Procedure: LUMBAR WOUND DEBRIDEMENT;  Surgeon: Newman Pies, MD;  Location: [place];  Service: Neurosurgery;  Laterality: N/A;   PARS PLANA VITRECTOMY Left 05/18/2018   Procedure: PARS PLANA VITRECTOMY WITH 25G REMOVAL/SUTURE INTRAOCULAR LENS;  Surgeon: Hayden Pedro, MD;  Location: [place];  Service: Ophthalmology;  Laterality: Left;   VITRECTOMY Left 05/18/2018   PARS PLANA VITRECTOMY WITH 25G REMOVAL/SUTURE INTRAOCULAR LENS (Left)   X-STOP IMPLANTATION      His Family History Is  Significant For: Family History  Problem Relation Age of Onset   Parkinson's disease Mother    CAD Father    Kidney disease Father    Anesthesia problems Neg Hx    Broken bones Neg Hx    Cancer Neg Hx    Clotting disorder Neg Hx    Collagen disease Neg Hx    Diabetes Neg Hx    Dislocations Neg Hx    Osteoporosis Neg Hx    Rheumatologic disease Neg Hx    Scoliosis Neg Hx    Severe sprains Neg Hx    Restless legs syndrome Neg Hx     His Social History Is Significant For: Social History   Socioeconomic History   Marital status: Married    Spouse name: Rise Paganini   Number of children:  1   Years of education: 43   Highest education level: Master's degree (e.g., MA, MS, MEng, MEd, MSW, MBA)  Occupational History   Occupation: Retired  Tobacco Use   Smoking status: Former    Types: Cigarettes    Quit date: 1978    Years since quitting: 46.0   Smokeless tobacco: Never  Vaping Use   Vaping Use: Never used  Substance and Sexual Activity   Alcohol use: Yes    Alcohol/week: 20.0 standard drinks of alcohol    Types: 20 Shots of liquor per week   Drug use: No   Sexual activity: Not on file  Other Topics Concern   Not on file  Social History Narrative   Lives at home with his wife.   Right-handed.   Caffeine- minimal use (4 ounces tea/soda per day)   Social Determinants of Health   Financial Resource Strain: Not on file  Food Insecurity: Not on file  Transportation Needs: Not on file  Physical Activity: Not on file  Stress: Not on file  Social Connections: Not on file    His Allergies Are:  Allergies  Allergen Reactions   Aricept [Donepezil Hcl] Diarrhea and Other (See Comments)    Diarrhea, stomach problems.    Atropine Swelling and Other (See Comments)    Eyes swollen and red   Other Other (See Comments)    Moisturizing eye drop - unknown  :   His Current Medications Are:  Outpatient Encounter Medications as of 12/16/2022  Medication Sig    amLODipine-valsartan (EXFORGE) 10-160 MG tablet Take 1 tablet by mouth daily.   Capsaicin-Menthol-Methyl Sal (CAPSAICIN-METHYL SAL-MENTHOL) 0.025-1-12 % CREA Apply 1 Application topically 3 (three) times daily as needed (for severe pain not released with pills). (Patient taking differently: Apply 1 Application topically as needed (for severe pain not released with pills).)   COVID-19 mRNA vaccine 2023-2024 (COMIRNATY) syringe Inject into the muscle.   docusate sodium (COLACE) 100 MG capsule Take 1 capsule (100 mg total) by mouth 2 (two) times daily.   fenofibrate (TRICOR) 145 MG tablet Take 145 mg by mouth daily.   FLUoxetine (PROZAC) 20 MG capsule TAKE 1 CAPSULE BY MOUTH EVERY DAY   folic acid (FOLVITE) 1 MG tablet Take 1 mg by mouth daily.   gabapentin (NEURONTIN) 300 MG capsule Take 300 mg by mouth 3 (three) times daily.   influenza vaccine adjuvanted (FLUAD) 0.5 ML injection Inject into the muscle.   levothyroxine (SYNTHROID) 50 MCG tablet Take 50 mcg by mouth daily before breakfast.   memantine (NAMENDA XR) 28 MG CP24 24 hr capsule Take 1 capsule (28 mg total) by mouth daily.   mirtazapine (REMERON) 30 MG tablet Take 1 tablet (30 mg total) by mouth at bedtime. Take 1 tablet at bedtime (Patient taking differently: Take 30 mg by mouth every morning.)   Multiple Vitamins-Minerals (MULTIVITAMIN WITH MINERALS) tablet Take 1 tablet by mouth daily.   oxyCODONE-acetaminophen (PERCOCET/ROXICET) 5-325 MG tablet Take 1-2 tablets by mouth every 4 (four) hours as needed for moderate pain. (Patient taking differently: Take 1-2 tablets by mouth as needed for moderate pain.)   pantoprazole (PROTONIX) 40 MG tablet Take 40 mg by mouth daily.   rosuvastatin (CRESTOR) 10 MG tablet Take 10 mg by mouth every morning.   RSV vaccine recomb adjuvanted (AREXVY) 120 MCG/0.5ML injection Inject into the muscle.   sucralfate (CARAFATE) 1 g tablet Take 1 g by mouth 2 (two) times daily.   thiamine 100 MG tablet Take 100 mg  by mouth daily.   traMADol (ULTRAM) 50 MG tablet as needed.   XTAMPZA ER 18 MG C12A Take 18 mg by mouth every 12 (twelve) hours.   No facility-administered encounter medications on file as of 12/16/2022.  :   Review of Systems:  Out of a complete 14 point review of systems, all are reviewed and negative with the exception of these symptoms as listed below:  Review of Systems  Neurological:        Pt here for RLS consult  Pt states he has pain in both legs Pt states increased  pain at night . Wife states pt has fallen 3x in last month Wife states pt refuses to use cane and walker     Objective:  Neurological Exam  Physical Exam Physical Examination:   Vitals:   12/16/22 0916  BP: (!) 145/81  Pulse: 76    General Examination: The patient is a very pleasant 77 y.o. male in no acute distress. He appears well-developed and well-nourished and very well groomed.   HEENT: Normocephalic, atraumatic, pupils are equal, round and reactive to light, extraocular tracking is good without limitation to gaze excursion or nystagmus noted. Hearing is grossly intact. Face is symmetric with normal facial animation. Speech is clear with no dysarthria noted. There is no hypophonia. There is no lip, neck/head, jaw or voice tremor. Neck is supple with full range of passive and active motion. There are no carotid bruits on auscultation. Oropharynx exam reveals: moderate mouth dryness, adequate dental hygiene and moderate airway crowding.   Chest: Clear to auscultation without wheezing, rhonchi or crackles noted.  Heart: S1+S2+0, regular and normal without murmurs, rubs or gallops noted.   Abdomen: Soft, non-tender and non-distended.  Extremities: There is no pitting edema in the distal lower extremities bilaterally.   Skin: Warm and dry without trophic changes noted.   Musculoskeletal: exam reveals no obvious joint deformities. Increase in back curvature.   Neurologically:  Mental status: The  patient is awake, alert and oriented in all 4 spheres. His immediate and remote memory, attention, language skills and fund of knowledge are appropriate. There is no evidence of aphasia, agnosia, apraxia or anomia. Speech is clear with normal prosody and enunciation. Thought process is linear. Mood is normal and affect is normal.  Cranial nerves II - XII are as described above under HEENT exam.  Motor exam: Normal bulk, strength and tone is noted. There is no obvious action or resting tremor.  Fine motor skills and coordination: grossly intact.  Cerebellar testing: No dysmetria or intention tremor. There is no truncal or gait ataxia.  Sensory exam: intact to light touch in the upper and lower extremities.  Gait, station and balance: He stands easily. No veering to one side is noted. No leaning to one side is noted. Posture is mildly stooped forward. Gait is slow and cautious. No shuffling.    Assessment and Plan:  In summary, DEAN GOLDNER is a very pleasant 77 y.o.-year old male with an underlying medical history of memory loss (followed in our clinic previously by Dr. Jannifer Franklin and most recently seen by nurse practitioner Butler Denmark in May 2023), history of alcohol use disorder, anxiety, depression, gait disorder, hypertension, hyperlipidemia, reflux disease, lumbar degenerative disc disease with status post surgery in October 2023, who presents for evaluation of his restless leg symptoms.  He has been using gabapentin twice daily.  He is advised to work on lifestyle modification for now, as he already is on multiple high  risk medications at this time.  Adding ropinirole or any dopamine agonist would increase the risk for medication interactions and side effects.  In particular, since he is drinking alcohol every day, he is advised to work on alcohol reduction and elimination of possible as he also takes narcotic pain medication and gabapentin, as well as Remeron.  He may be at risk for underlying  obstructive sleep apnea which would increase his risk for restless leg symptoms and leg twitching at night.  He is offered a sleep study at this time but he declines.  He is advised to call our office if he were to change his mind.  For now, he is advised to increase his water intake to about 6 to 8 cups of water per day, avoid narcotic pain medication is much as possible, and avoid and possibly eliminate alcohol consumption on a day-to-day basis.  He is advised to use a cane or bed to get a walker at all times for gait safety.  He is encouraged to follow-up as scheduled in about 6 months in our clinic for memory checkup.   I answered all the questions today and the patient and his wife were in agreement with our plan.  Thank you very much for allowing me to participate in the care of this nice patient. If I can be of any further assistance to you please do not hesitate to call me at [phone].  Sincerely,   Star Age, MD, PhD  I spent 40 minutes in total face-to-face time and in reviewing records during pre-charting, more than 50% of which was spent in counseling and coordination of care, reviewing test results, reviewing medications and treatment regimen and/or in discussing or reviewing the diagnosis of RLS, the prognosis and treatment options. Pertinent laboratory and imaging test results that were available during this visit with the patient were reviewed by me and considered in my medical decision making (see chart for details).

## 2022-12-16 NOTE — Patient Instructions (Signed)
It was nice to meet you both today.  For your restless leg syndrome, I do not believe we need to add any medications at this time, in fact, you are already taking several high risk medications.  Please try to avoid taking narcotic pain medication and try to hydrate better with water.  Your balance issues and recurrent falls warrant increase in safety measures, please use your cane, better yet, your walker at all times for now.  I do believe hydration plays a big role in balance and sleep disturbances.  You may be at risk for obstructive sleep apnea.  I would be happy to evaluate you with a sleep study.  If you change your mind about pursuing a sleep study, you can call our office.  Please try to drink at least 6 if not 8 cups of water per day, 8 ounce size each.  Please reduce your alcohol consumption as it can affect your restless leg symptoms and also sleep disturbance.  Please limit yourself to less than 1 alcoholic beverage per day if possible.  Alcohol also adds to fall risk.  Please talk to your primary care about ways to reduce alcohol consumption if you need help.  You can continue with the gabapentin as prescribed by Dr. Arnoldo Morale.  Follow-up as scheduled with Butler Denmark, NP or make an appointment in 6 months to see her.

## 2022-12-22 ENCOUNTER — Other Ambulatory Visit: Payer: Self-pay | Admitting: Neurology

## 2023-01-13 ENCOUNTER — Encounter: Payer: Self-pay | Admitting: Internal Medicine

## 2023-01-13 ENCOUNTER — Other Ambulatory Visit: Payer: Self-pay

## 2023-01-13 ENCOUNTER — Ambulatory Visit (INDEPENDENT_AMBULATORY_CARE_PROVIDER_SITE_OTHER): Payer: Medicare Other | Admitting: Internal Medicine

## 2023-01-13 VITALS — BP 134/79 | HR 73 | Temp 97.3°F | Ht 66.0 in | Wt 169.0 lb

## 2023-01-13 DIAGNOSIS — M4620 Osteomyelitis of vertebra, site unspecified: Secondary | ICD-10-CM

## 2023-01-13 DIAGNOSIS — T847XXD Infection and inflammatory reaction due to other internal orthopedic prosthetic devices, implants and grafts, subsequent encounter: Secondary | ICD-10-CM | POA: Diagnosis not present

## 2023-01-13 NOTE — Progress Notes (Signed)
[facility]  Patient Active Problem List   Diagnosis Date Noted   Pain in right shoulder 10/27/2022   Pain in right wrist 10/27/2022   Wound infection complicating hardware ([place]) 08/26/2021   Postoperative complication of skin involving drainage from surgical wound 08/26/2021   Lumbar adjacent segment disease with spondylolisthesis 08/14/2021   Insomnia 04/19/2020   Dislocated intraocular lens 05/18/2018   Dislocated intraocular lens, sequela 05/05/2018   Spondylolisthesis of lumbar region 05/05/2018   Memory difficulties 04/08/2018   Gait abnormality 04/08/2018   Alcohol withdrawal ([place]) 02/28/2018   Weakness 02/26/2018   Essential hypertension 02/26/2018   HLD (hyperlipidemia) 02/26/2018   Tremors of nervous system 02/26/2018   Alcohol abuse 02/26/2018   Weakness generalized 02/26/2018      Subjective:    Patient ID: Brandon Fleming, male    DOB: 05-27-1946, 77 y.o.   MRN: [id number]  Chief Complaint  Patient presents with   Follow-up     Cc -- f/u lumbar spine om complicated by hardware presence  HPI:  Brandon Fleming is a 77 y.o. male hx l4-5 fusion 2019 with recurrent back pain/neurogenic claudication, s/p on 8/31 L3-4 laminotomy/facetectomy (complex surgical procedure due to prior lumbar interbody fusion), right L3-4 interbody fusion and zimmer DBM and insertion interbody prosthesis, posterior L3-5 instrumentation, however complicated most recently by early surgical site infection admitted 9/12 for I&D with retention hardware   I saw paitent during admission 08/26/21.  Reviewed 9/12 operative note. Lot of pus in sq space; compromised lumbosacral fascia with pus tracking down to spine/hardware. Wound cx mssa. Hardware left in place. No blood cx obtained He had a rash while on cefepime/vanc, but tolerated cefazolin Discharged on rifampin and cefazolin Plan 6 weeks cefazolin until 10/24 then to transition to cefadroxil for  chronic suppression along with 3 months rifampin   ------ 10/04 id clinic f/u Patient had 2 weeks burning 4 lesions on his forehead; was skin color then turned erythematous. No vessicles. Also decreased appetite, generalized weakness since discharge, worse Pain same in the back; severe. Difficulty getting around. He gets around though with FWW No LE neurological deficit or urinary bowel incontinence No n/v/diarrhea No joint pain muscle ache otherwise Feels cold all the time No fever No sorethroat/cough No sick contact  [facility] labs reviewed Crp up the past week Lft up  Hct down  10/26 id f/u Shingle on forehead had resolved Still with poor appetite, malaise New legs edema -- concerned if cefazolin can do that There is occasional serosanguinous stain at incision. Pain stable mild-moderate. Patient ambulatory No fever, chill No diarrhea, n/v  Labs not available  11/18 id f/u Doing really well Wound all closed  Tolerate oral cefadroxil which was started 10/26... no further fatigue No gi sx  02/11/22 id f/u Hasn't taken abx for the last 8 days as he wasn't feeling well. Started out as nausea/epigastric discomfort, then also loose stool to diarrhea progressing to tarry black stool Has not eating much if at all and there are associated fatigue, wobbly gait. No fever, chill No sick contact The stool diarrhea frequency has decreased although he questions if it is due to decreased intake. Stool is soft and more formed Has hx of esophageal dilatation due to stricture. This was last done early spring 2022 Hasn't taken any prednisone or nsaids leading up this   No new back pain   3/09 id f/u Reviewed labs from 2/28 --  mild aki He was seen by GI right a way and patient's report there was lots of inflammation. Given some kind of pills to take twice a day  He said zofran helping as well with nausea Eating a little better Overall feeling better with strength as well  No  more black stool  Back on antibiotics but most days can only do once a day   4/27 id f/u Patient doing very well. Not having much nausea Still occasional fall due to bourbon drinking No f/c Eating at least 1 meal a day No weight loss   06/19/22 id f/u Patient doing well, eating better Every now and then (every 5th night or so) has severe back pain that his current meds don't help much but other occasions they do help.... He is taking xtampza (oxycodone ER), prn tramadol No fever, chill He is taking memantine for his mild dementia.   Reviewed meds and he is on tylenol pm as well which I asked him to discuss with neurology if this would be in his best benefit with the dementia   01/13/23 id f/u Patient had worsening pain to the point he couldn't walk so he underwent another lumbar spine surgery/laminectomy along with revision of prior lumbar fusion. I reviewed op note by dr Arnoldo Morale in 10/2022  He initially does ok with much less pain, but then it has been getting worse again. Constant moderate pain. He is able to function though  He continues on cefadroxil 500 bid No n/v/diarrhea/rash  No f/c  Allergies  Allergen Reactions   Aricept [Donepezil Hcl] Diarrhea and Other (See Comments)    Diarrhea, stomach problems.    Atropine Swelling and Other (See Comments)    Eyes swollen and red   Other Other (See Comments)    Moisturizing eye drop - unknown      Outpatient Medications Prior to Visit  Medication Sig Dispense Refill   amLODipine-valsartan (EXFORGE) 10-160 MG tablet Take 1 tablet by mouth daily.     Capsaicin-Menthol-Methyl Sal (CAPSAICIN-METHYL SAL-MENTHOL) 0.025-1-12 % CREA Apply 1 Application topically 3 (three) times daily as needed (for severe pain not released with pills). (Patient taking differently: Apply 1 Application topically as needed (for severe pain not released with pills).) 56.6 g 3   cefadroxil (DURICEF) 500 MG capsule Take 500 mg by mouth 2 (two) times  daily.     COVID-19 mRNA vaccine 2023-2024 (COMIRNATY) syringe Inject into the muscle. 0.3 mL 0   docusate sodium (COLACE) 100 MG capsule Take 1 capsule (100 mg total) by mouth 2 (two) times daily. 10 capsule 0   fenofibrate (TRICOR) 145 MG tablet Take 145 mg by mouth daily.     FLUoxetine (PROZAC) 20 MG capsule TAKE 1 CAPSULE BY MOUTH EVERY DAY 90 capsule 2   folic acid (FOLVITE) 1 MG tablet Take 1 mg by mouth daily.     gabapentin (NEURONTIN) 300 MG capsule Take 300 mg by mouth 3 (three) times daily.     influenza vaccine adjuvanted (FLUAD) 0.5 ML injection Inject into the muscle. 0.5 mL 0   levothyroxine (SYNTHROID) 50 MCG tablet Take 50 mcg by mouth daily before breakfast.     memantine (NAMENDA XR) 28 MG CP24 24 hr capsule Take 1 capsule (28 mg total) by mouth daily. 90 capsule 3   mirtazapine (REMERON) 30 MG tablet TAKE 1 TABLET AT BEDTIME 90 tablet 1   Multiple Vitamins-Minerals (MULTIVITAMIN WITH MINERALS) tablet Take 1 tablet by mouth daily.     ondansetron (  ZOFRAN) 4 MG tablet 1 tablet Orally PRN     oxyCODONE-acetaminophen (PERCOCET/ROXICET) 5-325 MG tablet Take 1-2 tablets by mouth every 4 (four) hours as needed for moderate pain. (Patient taking differently: Take 1-2 tablets by mouth as needed for moderate pain.) 30 tablet 0   pantoprazole (PROTONIX) 40 MG tablet Take 40 mg by mouth daily.     rosuvastatin (CRESTOR) 10 MG tablet Take 10 mg by mouth every morning.     RSV vaccine recomb adjuvanted (AREXVY) 120 MCG/0.5ML injection Inject into the muscle. 0.5 mL 0   sucralfate (CARAFATE) 1 g tablet Take 1 g by mouth 2 (two) times daily.     thiamine 100 MG tablet Take 100 mg by mouth daily.     traMADol (ULTRAM) 50 MG tablet as needed.     XTAMPZA ER 18 MG C12A Take 18 mg by mouth every 12 (twelve) hours.     No facility-administered medications prior to visit.     Social History   Socioeconomic History   Marital status: Married    Spouse name: Rise Paganini   Number of children: 1    Years of education: 16   Highest education level: Master's degree (e.g., MA, MS, MEng, MEd, MSW, MBA)  Occupational History   Occupation: Retired  Tobacco Use   Smoking status: Former    Types: Cigarettes    Quit date: 1978    Years since quitting: 46.1   Smokeless tobacco: Never  Vaping Use   Vaping Use: Never used  Substance and Sexual Activity   Alcohol use: Yes    Alcohol/week: 20.0 standard drinks of alcohol    Types: 20 Shots of liquor per week   Drug use: No   Sexual activity: Not on file  Other Topics Concern   Not on file  Social History Narrative   Lives at home with his wife.   Right-handed.   Caffeine- minimal use (4 ounces tea/soda per day)   Social Determinants of Health   Financial Resource Strain: Not on file  Food Insecurity: Not on file  Transportation Needs: Not on file  Physical Activity: Not on file  Stress: Not on file  Social Connections: Not on file  Intimate Partner Violence: Not on file      Review of Systems    All other ros negative  Objective:    BP 134/79   Pulse 73   Temp (!) 97.3 F (36.3 C) (Temporal)   Ht '5\' 6"'$  (1.676 m)   Wt 169 lb (76.7 kg)   SpO2 99%   BMI 27.28 kg/m  Nursing note and vital signs reviewed.  Physical Exam     General/constitutional: no distress, pleasant HEENT: Normocephalic, PER, Conj Clear, EOMI, Oropharynx clear Neck supple CV: rrr no mrg Lungs: clear to auscultation, normal respiratory effort Abd: Soft, Nontender Ext: no edema Skin: No Rash  Neuro: nonfocal MSK: no peripheral joint swelling/tenderness/warmth; back spines nontender         Labs: Lab Results  Component Value Date   WBC 4.5 09/12/2022   HGB 11.7 (L) 09/12/2022   HCT 35.1 (L) 09/12/2022   MCV 95.9 09/12/2022   PLT 180 53/66/4403   Last metabolic panel Lab Results  Component Value Date   GLUCOSE 106 (H) 09/12/2022   NA 139 09/12/2022   K 4.8 09/12/2022   CL 112 (H) 09/12/2022   CO2 22 09/12/2022   BUN 30  (H) 09/12/2022   CREATININE 1.65 (H) 09/12/2022   EGFR 58 (L) 04/10/2022  GFRNONAA 43 (L) 09/12/2022   CALCIUM 9.3 09/12/2022   PROT 6.3 (L) 09/12/2022   ALBUMIN 3.1 (L) 09/12/2022   BILITOT 0.4 09/12/2022   ALKPHOS 64 09/12/2022   AST 87 (H) 09/12/2022   ALT 35 09/12/2022   ANIONGAP 5 09/12/2022    [facility]: 10/25 cbc 4/8.2/177; cr 0.98 10/18 cbc 6/8.6/251; cr 1.02 9/27 cbc 6/8/225 normal diff; cr 1.04 9/19 cbc 9/9/332; cr 1.02; lft 88/17/151/0.7  Crp: 10/25    16 (<10) 10/18    13 (<10) 9/29    106 (<10) 9/19      52 (<10)  Micro:  Serology:  Imaging:  Assessment & Plan:   Problem List Items Addressed This Visit       Other   Wound infection complicating hardware ([place])   Relevant Medications   cefadroxil (DURICEF) 500 MG capsule   Other Visit Diagnoses     Vertebral osteomyelitis ([place])    -  Primary   Relevant Medications   cefadroxil (DURICEF) 500 MG capsule     No orders of the defined types were placed in this encounter.   Abx: 10/26-c cefadroxil 500 mg bid since 04/10/2022  9/15-10/26 cefazolin 9/17-10/04 rifampin 300 mg po bid   9/12-15 vanc 9/12-15 cefepime   ASSESSMENT: Surgical site infection/vertebral om lumbar Hardware complicating wound infection Rash suspect abx allergy to cefepime, less likely vanc   77 yo male hx l4-5 fusion 2019 with recurrent back pain/neurogenic claudication, s/p on 8/31 L3-4 laminotomy/facetectomy (complex surgical procedure due to prior lumbar intervody fusion), right L3-4 interbody fusion and zimmer DBM and insertion interbody prosthesis, posterior L3-5 instrumentation, however complicated most recently by early surgical site infection admitted 9/12 for I&D with retention hardware. Patient here for id clinic f/u   Reviewed 9/12 operative note. Hardware retained. Lot of pus in sq space; compromised lumbosacral fascia with pus tracking down to spine/hardware. Wound cx mssa   No sepsis this admission otherwise.  No bcx obtained.   Given recent hardware placement <30 days, discharged on combination abx with rifampin  Developed rash on vanc/cefepime. Did ok on cefazolin though  ------- 10/4 id clinic f/u Fatigue/generalized weakness/poor apptite likely abx combination and ongoing pain/pain meds (taking extended oxycodone currently). Hct down and lft up considered rifampin side effect Also has a forehead lesion that is suggestive of shingle. ?cause for crp elevation Pain in lower back severe but stable without neurologic deficit. Incision intact. Patient to discuss pain management with nsg today  At this time, will stop rifampin and continue following labs Will see back in around 3 weeks for follow up   10/26 id clinic assessment Crp down Some intermittent serosanguinous stain at the incision but no dehiscence; slight erythema around it but ?related to tape/dressing  Fatigue/poor appetite likely multifactorial, low hemoglobin, infection, and ?as below suggested   New legs edema/ongoing fatigue query chf. No previous echo available; no hx chf. Needs chf evaluation. Cefazolin not typical to cause edema  11/18 id assessment Doing well today on oral cefadroxil. Sx from previous 2 clinic visits (fatigue/leg edema) no longer present. Not really sure what it was although temporally related to iv abx use and rifampin  Plan indefinite cefadroxil, maybe decreased dose in another 2-3 months depending on clinical status/improvement  2/28 id assessment Gi sx / tarry stool ?PUD with slow upper gib? Diarrhea slowed down since off abx -- might not be related given duration of time he has been on abx. Doesn't seem to be cdiff Discuss importance of  staying on abx due to metals/back infection Also considered dehydration related acute renal injury causing more nausea  3/09 id assessment Clinically improving with upper gi sx Not taking abx as prescribed - advise him this and the ?presumed acid blocker given by  GI should be his top priority   04/10/22 id assessment Tolerating 1 gram cefadroxil He is at least 6 months out and clinically doing well (went to [place] a week prior to this clinic) It would be reasonable to do 500 mg twice a day We also need lab today   06/19/22 id assessment Doing well will forgo lab today Pain -- in setting of dementia advise him to talk with neurology regarding choices of drugs given his dementia  I can trial him on topical capsaicin cream and see if helps with severe pain as needed Neuropathic pain component blocker will let him discuss with neurology first Advise him to talk about benadryl (tylenol pm) use with neurology as wel Continue cefadroxil 500 mg po bid   He has [facility] disabled parking app to be dropped off with me for me to sign   01/13/23 id assessment Continues to do well. Has another lumbar spine revision surgery for worsening LE pain. No pus or sign of hardware loosening during operative finding Will continue on cefadroxil indefinitely  See me again in 3 months; will do labs then  He asked about physical therapy. He'll see dr Arnoldo Morale again soon. I do not see issue with core muscle strengthening, flexibility/yoga stretching, and proprioreception therapy. He can talk more about this with dr Wynonia Sours    Follow-up: Return in about 3 months (around 04/14/2023).  I have spent a total of 30 minutes of face-to-face and non-face-to-face time, excluding clinical staff time, preparing to see patient, ordering tests and/or medications, and provide counseling the patient     Jabier Mutton, [facility] [phone]  pager   [phone] cell 01/13/2023, 9:11 AM

## 2023-01-15 DIAGNOSIS — N1832 Chronic kidney disease, stage 3b: Secondary | ICD-10-CM | POA: Diagnosis not present

## 2023-01-15 DIAGNOSIS — D649 Anemia, unspecified: Secondary | ICD-10-CM | POA: Diagnosis not present

## 2023-01-15 DIAGNOSIS — R7301 Impaired fasting glucose: Secondary | ICD-10-CM | POA: Diagnosis not present

## 2023-01-15 DIAGNOSIS — M5136 Other intervertebral disc degeneration, lumbar region: Secondary | ICD-10-CM | POA: Diagnosis not present

## 2023-01-15 DIAGNOSIS — M5416 Radiculopathy, lumbar region: Secondary | ICD-10-CM | POA: Diagnosis not present

## 2023-01-15 DIAGNOSIS — Z1212 Encounter for screening for malignant neoplasm of rectum: Secondary | ICD-10-CM | POA: Diagnosis not present

## 2023-01-15 DIAGNOSIS — Z125 Encounter for screening for malignant neoplasm of prostate: Secondary | ICD-10-CM | POA: Diagnosis not present

## 2023-01-15 DIAGNOSIS — E785 Hyperlipidemia, unspecified: Secondary | ICD-10-CM | POA: Diagnosis not present

## 2023-01-15 DIAGNOSIS — E039 Hypothyroidism, unspecified: Secondary | ICD-10-CM | POA: Diagnosis not present

## 2023-01-15 DIAGNOSIS — K219 Gastro-esophageal reflux disease without esophagitis: Secondary | ICD-10-CM | POA: Diagnosis not present

## 2023-01-20 DIAGNOSIS — M5451 Vertebrogenic low back pain: Secondary | ICD-10-CM | POA: Diagnosis not present

## 2023-01-20 DIAGNOSIS — R82998 Other abnormal findings in urine: Secondary | ICD-10-CM | POA: Diagnosis not present

## 2023-01-21 DIAGNOSIS — Z961 Presence of intraocular lens: Secondary | ICD-10-CM | POA: Diagnosis not present

## 2023-01-21 DIAGNOSIS — H5211 Myopia, right eye: Secondary | ICD-10-CM | POA: Diagnosis not present

## 2023-01-22 DIAGNOSIS — M5451 Vertebrogenic low back pain: Secondary | ICD-10-CM | POA: Diagnosis not present

## 2023-01-27 DIAGNOSIS — M5451 Vertebrogenic low back pain: Secondary | ICD-10-CM | POA: Diagnosis not present

## 2023-01-29 DIAGNOSIS — M5451 Vertebrogenic low back pain: Secondary | ICD-10-CM | POA: Diagnosis not present

## 2023-02-03 DIAGNOSIS — M5451 Vertebrogenic low back pain: Secondary | ICD-10-CM | POA: Diagnosis not present

## 2023-02-05 DIAGNOSIS — M5451 Vertebrogenic low back pain: Secondary | ICD-10-CM | POA: Diagnosis not present

## 2023-02-10 DIAGNOSIS — M5451 Vertebrogenic low back pain: Secondary | ICD-10-CM | POA: Diagnosis not present

## 2023-02-12 DIAGNOSIS — M5451 Vertebrogenic low back pain: Secondary | ICD-10-CM | POA: Diagnosis not present

## 2023-02-17 DIAGNOSIS — M5451 Vertebrogenic low back pain: Secondary | ICD-10-CM | POA: Diagnosis not present

## 2023-02-19 DIAGNOSIS — M5451 Vertebrogenic low back pain: Secondary | ICD-10-CM | POA: Diagnosis not present

## 2023-02-23 DIAGNOSIS — M5451 Vertebrogenic low back pain: Secondary | ICD-10-CM | POA: Diagnosis not present

## 2023-02-26 DIAGNOSIS — M5451 Vertebrogenic low back pain: Secondary | ICD-10-CM | POA: Diagnosis not present

## 2023-03-03 DIAGNOSIS — M5451 Vertebrogenic low back pain: Secondary | ICD-10-CM | POA: Diagnosis not present

## 2023-03-05 DIAGNOSIS — M5451 Vertebrogenic low back pain: Secondary | ICD-10-CM | POA: Diagnosis not present

## 2023-03-11 DIAGNOSIS — M5451 Vertebrogenic low back pain: Secondary | ICD-10-CM | POA: Diagnosis not present

## 2023-03-13 DIAGNOSIS — M5451 Vertebrogenic low back pain: Secondary | ICD-10-CM | POA: Diagnosis not present

## 2023-03-16 DIAGNOSIS — M5451 Vertebrogenic low back pain: Secondary | ICD-10-CM | POA: Diagnosis not present

## 2023-03-24 DIAGNOSIS — M5451 Vertebrogenic low back pain: Secondary | ICD-10-CM | POA: Diagnosis not present

## 2023-03-25 ENCOUNTER — Other Ambulatory Visit: Payer: Self-pay | Admitting: Neurology

## 2023-03-25 DIAGNOSIS — D649 Anemia, unspecified: Secondary | ICD-10-CM | POA: Diagnosis not present

## 2023-03-25 DIAGNOSIS — E785 Hyperlipidemia, unspecified: Secondary | ICD-10-CM | POA: Diagnosis not present

## 2023-03-25 DIAGNOSIS — K219 Gastro-esophageal reflux disease without esophagitis: Secondary | ICD-10-CM | POA: Diagnosis not present

## 2023-03-25 DIAGNOSIS — E039 Hypothyroidism, unspecified: Secondary | ICD-10-CM | POA: Diagnosis not present

## 2023-03-25 NOTE — Telephone Encounter (Signed)
Requested Prescriptions   Pending Prescriptions Disp Refills   FLUoxetine (PROZAC) 20 MG capsule [Pharmacy Med Name: FLUOXETINE HCL 20 MG CAPSULE] 90 capsule 2    Sig: TAKE 1 CAPSULE BY MOUTH EVERY DAY   Pt last seen by Dr. Frances Furbish 12/16/22, upcoming appt on 05/04/23. When trying to refill this med some interactions posted that I need the provider to review.

## 2023-03-30 DIAGNOSIS — M5451 Vertebrogenic low back pain: Secondary | ICD-10-CM | POA: Diagnosis not present

## 2023-04-01 DIAGNOSIS — M5451 Vertebrogenic low back pain: Secondary | ICD-10-CM | POA: Diagnosis not present

## 2023-04-06 DIAGNOSIS — M5416 Radiculopathy, lumbar region: Secondary | ICD-10-CM | POA: Diagnosis not present

## 2023-04-06 DIAGNOSIS — M5451 Vertebrogenic low back pain: Secondary | ICD-10-CM | POA: Diagnosis not present

## 2023-04-06 DIAGNOSIS — M5136 Other intervertebral disc degeneration, lumbar region: Secondary | ICD-10-CM | POA: Diagnosis not present

## 2023-04-07 ENCOUNTER — Other Ambulatory Visit (HOSPITAL_BASED_OUTPATIENT_CLINIC_OR_DEPARTMENT_OTHER): Payer: Self-pay

## 2023-04-07 MED ORDER — PNEUMOCOCCAL 20-VAL CONJ VACC 0.5 ML IM SUSY
0.5000 mL | PREFILLED_SYRINGE | Freq: Once | INTRAMUSCULAR | 0 refills | Status: AC
  Filled 2023-04-07: qty 0.5, 1d supply, fill #0

## 2023-04-08 DIAGNOSIS — M5451 Vertebrogenic low back pain: Secondary | ICD-10-CM | POA: Diagnosis not present

## 2023-04-13 DIAGNOSIS — M5451 Vertebrogenic low back pain: Secondary | ICD-10-CM | POA: Diagnosis not present

## 2023-04-14 ENCOUNTER — Other Ambulatory Visit: Payer: Self-pay

## 2023-04-14 ENCOUNTER — Ambulatory Visit (INDEPENDENT_AMBULATORY_CARE_PROVIDER_SITE_OTHER): Payer: Medicare Other | Admitting: Internal Medicine

## 2023-04-14 VITALS — BP 137/77 | HR 78 | Resp 16 | Ht 66.0 in | Wt 169.0 lb

## 2023-04-14 DIAGNOSIS — M4626 Osteomyelitis of vertebra, lumbar region: Secondary | ICD-10-CM | POA: Diagnosis not present

## 2023-04-14 DIAGNOSIS — T847XXD Infection and inflammatory reaction due to other internal orthopedic prosthetic devices, implants and grafts, subsequent encounter: Secondary | ICD-10-CM

## 2023-04-14 DIAGNOSIS — M4620 Osteomyelitis of vertebra, site unspecified: Secondary | ICD-10-CM

## 2023-04-14 NOTE — Progress Notes (Signed)
[facility]  Patient Active Problem List   Diagnosis Date Noted   Pain in right shoulder 10/27/2022   Pain in right wrist 10/27/2022   Wound infection complicating hardware (HCC) 08/26/2021   Postoperative complication of skin involving drainage from surgical wound 08/26/2021   Lumbar adjacent segment disease with spondylolisthesis 08/14/2021   Insomnia 04/19/2020   Dislocated intraocular lens 05/18/2018   Dislocated intraocular lens, sequela 05/05/2018   Spondylolisthesis of lumbar region 05/05/2018   Memory difficulties 04/08/2018   Gait abnormality 04/08/2018   Alcohol withdrawal (HCC) 02/28/2018   Weakness 02/26/2018   Essential hypertension 02/26/2018   HLD (hyperlipidemia) 02/26/2018   Tremors of nervous system 02/26/2018   Alcohol abuse 02/26/2018   Weakness generalized 02/26/2018      Subjective:    Patient ID: CIRILO CANNER, male    DOB: 03/19/1946, 77 y.o.   MRN: [id number]  No chief complaint on file.    Cc -- f/u lumbar spine om complicated by hardware presence  HPI:  JOSHUWA VECCHIO is a 77 y.o. male hx l4-5 fusion 2019 with recurrent back pain/neurogenic claudication, s/p on 08/14/21 L3-4 laminotomy/facetectomy (complex surgical procedure due to prior lumbar interbody fusion), right L3-4 interbody fusion and zimmer DBM and insertion interbody prosthesis, posterior L3-5 instrumentation, however complicated by early surgical site infection admitted 08/26/21 for I&D with retention hardware cx growing mssa   I saw paitent during admission 08/26/21.  Reviewed 9/12 operative note. Lot of pus in sq space; compromised lumbosacral fascia with pus tracking down to spine/hardware. Wound cx mssa. Hardware left in place. No blood cx obtained He had a rash while on cefepime/vanc, but tolerated cefazolin Discharged on rifampin and cefazolin Plan 6 weeks cefazolin until 10/24 then to transition to cefadroxil for chronic suppression  along with 3 months rifampin   ------ 10/04 id clinic f/u Patient had 2 weeks burning 4 lesions on his forehead; was skin color then turned erythematous. No vessicles. Also decreased appetite, generalized weakness since discharge, worse Pain same in the back; severe. Difficulty getting around. He gets around though with FWW No LE neurological deficit or urinary bowel incontinence No n/v/diarrhea No joint pain muscle ache otherwise Feels cold all the time No fever No sorethroat/cough No sick contact  [facility] labs reviewed Crp up the past week Lft up  Hct down  10/26 id f/u Shingle on forehead had resolved Still with poor appetite, malaise New legs edema -- concerned if cefazolin can do that There is occasional serosanguinous stain at incision. Pain stable mild-moderate. Patient ambulatory No fever, chill No diarrhea, n/v  Labs not available  11/18 id f/u Doing really well Wound all closed  Tolerate oral cefadroxil which was started 10/26... no further fatigue No gi sx  02/11/22 id f/u Hasn't taken abx for the last 8 days as he wasn't feeling well. Started out as nausea/epigastric discomfort, then also loose stool to diarrhea progressing to tarry black stool Has not eating much if at all and there are associated fatigue, wobbly gait. No fever, chill No sick contact The stool diarrhea frequency has decreased although he questions if it is due to decreased intake. Stool is soft and more formed Has hx of esophageal dilatation due to stricture. This was last done early spring 2022 Hasn't taken any prednisone or nsaids leading up this   No new back pain   3/09 id f/u Reviewed labs from 2/28 -- mild aki He was  seen by GI right a way and patient's report there was lots of inflammation. Given some kind of pills to take twice a day  He said zofran helping as well with nausea Eating a little better Overall feeling better with strength as well  No more black  stool  Back on antibiotics but most days can only do once a day   4/27 id f/u Patient doing very well. Not having much nausea Still occasional fall due to bourbon drinking No f/c Eating at least 1 meal a day No weight loss   06/19/22 id f/u Patient doing well, eating better Every now and then (every 5th night or so) has severe back pain that his current meds don't help much but other occasions they do help.... He is taking xtampza (oxycodone ER), prn tramadol No fever, chill He is taking memantine for his mild dementia.   Reviewed meds and he is on tylenol pm as well which I asked him to discuss with neurology if this would be in his best benefit with the dementia   01/13/23 id f/u Patient had worsening pain to the point he couldn't walk so he underwent another lumbar spine surgery/laminectomy along with revision of prior lumbar fusion. I reviewed op note by dr Lovell Sheehan in 10/2022  He initially does ok with much less pain, but then it has been getting worse again. Constant moderate pain. He is able to function though  He continues on cefadroxil 500 bid No n/v/diarrhea/rash  No f/c   04/14/23 id f/u I had wanted to see him a little earlier due to increased pain last fall and last visit He had since doing twice a week pt/ot and gone off oxycodone, then tramadol, and now on buprenorphine patch. He has been seeing pain clinic since 12/2022 Pain is better off oxycodone No f/c Very functional Appetite better since off oxycodone as well No n/v/diarrhea Taking cefadroxil 500 bid  Allergies  Allergen Reactions   Aricept [Donepezil Hcl] Diarrhea and Other (See Comments)    Diarrhea, stomach problems.    Atropine Swelling and Other (See Comments)    Eyes swollen and red   Other Other (See Comments)    Moisturizing eye drop - unknown      Outpatient Medications Prior to Visit  Medication Sig Dispense Refill   amLODipine-valsartan (EXFORGE) 10-160 MG tablet Take 1 tablet by  mouth daily.     buprenorphine (BUTRANS) 5 MCG/HR PTWK 1 patch once a week.     Capsaicin-Menthol-Methyl Sal (CAPSAICIN-METHYL SAL-MENTHOL) 0.025-1-12 % CREA Apply 1 Application topically 3 (three) times daily as needed (for severe pain not released with pills). (Patient taking differently: Apply 1 Application topically as needed (for severe pain not released with pills).) 56.6 g 3   cefadroxil (DURICEF) 500 MG capsule Take 500 mg by mouth 2 (two) times daily.     docusate sodium (COLACE) 100 MG capsule Take 1 capsule (100 mg total) by mouth 2 (two) times daily. 10 capsule 0   fenofibrate (TRICOR) 145 MG tablet Take 145 mg by mouth daily.     FLUoxetine (PROZAC) 20 MG capsule TAKE 1 CAPSULE BY MOUTH EVERY DAY 90 capsule 2   folic acid (FOLVITE) 1 MG tablet Take 1 mg by mouth daily.     gabapentin (NEURONTIN) 300 MG capsule Take 300 mg by mouth 3 (three) times daily.     levothyroxine (SYNTHROID) 50 MCG tablet Take 50 mcg by mouth daily before breakfast.     memantine (NAMENDA XR) 28  MG CP24 24 hr capsule Take 1 capsule (28 mg total) by mouth daily. 90 capsule 3   mirtazapine (REMERON) 30 MG tablet TAKE 1 TABLET AT BEDTIME 90 tablet 1   Multiple Vitamins-Minerals (MULTIVITAMIN WITH MINERALS) tablet Take 1 tablet by mouth daily.     ondansetron (ZOFRAN) 4 MG tablet 1 tablet Orally PRN     oxyCODONE-acetaminophen (PERCOCET/ROXICET) 5-325 MG tablet Take 1-2 tablets by mouth every 4 (four) hours as needed for moderate pain. (Patient taking differently: Take 1-2 tablets by mouth as needed for moderate pain.) 30 tablet 0   pantoprazole (PROTONIX) 40 MG tablet Take 40 mg by mouth daily.     rosuvastatin (CRESTOR) 10 MG tablet Take 10 mg by mouth every morning.     sucralfate (CARAFATE) 1 g tablet Take 1 g by mouth 2 (two) times daily.     thiamine 100 MG tablet Take 100 mg by mouth daily.     XTAMPZA ER 18 MG C12A Take 18 mg by mouth every 12 (twelve) hours.     COVID-19 mRNA vaccine 2023-2024  (COMIRNATY) syringe Inject into the muscle. (Patient not taking: Reported on 04/14/2023) 0.3 mL 0   influenza vaccine adjuvanted (FLUAD) 0.5 ML injection Inject into the muscle. 0.5 mL 0   RSV vaccine recomb adjuvanted (AREXVY) 120 MCG/0.5ML injection Inject into the muscle. (Patient not taking: Reported on 04/14/2023) 0.5 mL 0   traMADol (ULTRAM) 50 MG tablet as needed. (Patient not taking: Reported on 04/14/2023)     No facility-administered medications prior to visit.     Social History   Socioeconomic History   Marital status: Married    Spouse name: Meriam Sprague   Number of children: 1   Years of education: 16   Highest education level: Master's degree (e.g., MA, MS, MEng, MEd, MSW, MBA)  Occupational History   Occupation: Retired  Tobacco Use   Smoking status: Former    Types: Cigarettes    Quit date: 1978    Years since quitting: 46.3   Smokeless tobacco: Never  Vaping Use   Vaping Use: Never used  Substance and Sexual Activity   Alcohol use: Yes    Alcohol/week: 20.0 standard drinks of alcohol    Types: 20 Shots of liquor per week   Drug use: No   Sexual activity: Not on file  Other Topics Concern   Not on file  Social History Narrative   Lives at home with his wife.   Right-handed.   Caffeine- minimal use (4 ounces tea/soda per day)   Social Determinants of Health   Financial Resource Strain: Not on file  Food Insecurity: Not on file  Transportation Needs: Not on file  Physical Activity: Not on file  Stress: Not on file  Social Connections: Not on file  Intimate Partner Violence: Not on file      Review of Systems    All other ros negative  Objective:    BP 137/77   Pulse 78   Resp 16   Ht 5\' 6"  (1.676 m)   Wt 169 lb (76.7 kg)   SpO2 98%   BMI 27.28 kg/m  Nursing note and vital signs reviewed.  Physical Exam     General/constitutional: no distress, pleasant HEENT: Normocephalic, PER, Conj Clear, EOMI, Oropharynx clear Neck supple CV: rrr no  mrg Lungs: clear to auscultation, normal respiratory effort Abd: Soft, Nontender Ext: no edema Skin: No Rash Neuro: nonfocal MSK: no peripheral joint swelling/tenderness/warmth; back spines nontender  Labs: Lab Results  Component Value Date   WBC 4.5 09/12/2022   HGB 11.7 (L) 09/12/2022   HCT 35.1 (L) 09/12/2022   MCV 95.9 09/12/2022   PLT 180 09/12/2022   Last metabolic panel Lab Results  Component Value Date   GLUCOSE 106 (H) 09/12/2022   NA 139 09/12/2022   K 4.8 09/12/2022   CL 112 (H) 09/12/2022   CO2 22 09/12/2022   BUN 30 (H) 09/12/2022   CREATININE 1.65 (H) 09/12/2022   EGFR 58 (L) 04/10/2022   GFRNONAA 43 (L) 09/12/2022   CALCIUM 9.3 09/12/2022   PROT 6.3 (L) 09/12/2022   ALBUMIN 3.1 (L) 09/12/2022   BILITOT 0.4 09/12/2022   ALKPHOS 64 09/12/2022   AST 87 (H) 09/12/2022   ALT 35 09/12/2022   ANIONGAP 5 09/12/2022    [facility]: 10/25 cbc 4/8.2/177; cr 0.98 10/18 cbc 6/8.6/251; cr 1.02 9/27 cbc 6/8/225 normal diff; cr 1.04 9/19 cbc 9/9/332; cr 1.02; lft 88/17/151/0.7  Crp: 10/25    16 (<10) 10/18    13 (<10) 9/29    106 (<10) 9/19      52 (<10)  Micro:  Serology:  Imaging:  Assessment & Plan:   Problem List Items Addressed This Visit   None No orders of the defined types were placed in this encounter.   Abx: 10/26-c cefadroxil 500 mg bid dosingsince 04/10/2022  9/15-10/26 cefazolin 9/17-10/04 rifampin 300 mg po bid   08/26/21-15 vanc 08/26/21-15 cefepime   ASSESSMENT: Surgical site infection/vertebral om lumbar -- mssa s/p I&D 08/26/2021 Hardware complicating wound infection Rash suspect abx allergy to cefepime, less likely vanc   77 yo male hx l4-5 fusion 2019 with recurrent back pain/neurogenic claudication, s/p on 8/31 L3-4 laminotomy/facetectomy (complex surgical procedure due to prior lumbar intervody fusion), right L3-4 interbody fusion and zimmer DBM and insertion interbody prosthesis, posterior L3-5  instrumentation, however complicated most recently by early surgical site infection admitted 9/12 for I&D with retention hardware. Patient here for id clinic f/u   Reviewed 9/12 operative note. Hardware retained. Lot of pus in sq space; compromised lumbosacral fascia with pus tracking down to spine/hardware. Wound cx mssa   No sepsis this admission otherwise. No bcx obtained.   Given recent hardware placement <30 days, discharged on combination abx with rifampin  Developed rash on vanc/cefepime. Did ok on cefazolin though  ------- 10/4 id clinic f/u Fatigue/generalized weakness/poor apptite likely abx combination and ongoing pain/pain meds (taking extended oxycodone currently). Hct down and lft up considered rifampin side effect Also has a forehead lesion that is suggestive of shingle. ?cause for crp elevation Pain in lower back severe but stable without neurologic deficit. Incision intact. Patient to discuss pain management with nsg today  At this time, will stop rifampin and continue following labs Will see back in around 3 weeks for follow up   10/26 id clinic assessment Crp down Some intermittent serosanguinous stain at the incision but no dehiscence; slight erythema around it but ?related to tape/dressing  Fatigue/poor appetite likely multifactorial, low hemoglobin, infection, and ?as below suggested   New legs edema/ongoing fatigue query chf. No previous echo available; no hx chf. Needs chf evaluation. Cefazolin not typical to cause edema  11/18 id assessment Doing well today on oral cefadroxil. Sx from previous 2 clinic visits (fatigue/leg edema) no longer present. Not really sure what it was although temporally related to iv abx use and rifampin  Plan indefinite cefadroxil, maybe decreased dose in another 2-3 months depending on clinical status/improvement  2/28 id assessment Gi sx / tarry stool ?PUD with slow upper gib? Diarrhea slowed down since off abx -- might not be  related given duration of time he has been on abx. Doesn't seem to be cdiff Discuss importance of staying on abx due to metals/back infection Also considered dehydration related acute renal injury causing more nausea  3/09 id assessment Clinically improving with upper gi sx Not taking abx as prescribed - advise him this and the ?presumed acid blocker given by GI should be his top priority   04/10/22 id assessment Tolerating 1 gram cefadroxil He is at least 6 months out and clinically doing well (went to [place] a week prior to this clinic) It would be reasonable to do 500 mg twice a day We also need lab today   06/19/22 id assessment Doing well will forgo lab today Pain -- in setting of dementia advise him to talk with neurology regarding choices of drugs given his dementia  I can trial him on topical capsaicin cream and see if helps with severe pain as needed Neuropathic pain component blocker will let him discuss with neurology first Advise him to talk about benadryl (tylenol pm) use with neurology as wel Continue cefadroxil 500 mg po bid   He has [employer] disabled parking app to be dropped off with me for me to sign   01/13/23 id assessment Continues to do well. Has another lumbar spine revision surgery for worsening LE pain. No pus or sign of hardware loosening during operative finding Will continue on cefadroxil indefinitely  See me again in 3 months; will do labs then  He asked about physical therapy. He'll see dr Lovell Sheehan again soon. I do not see issue with core muscle strengthening, flexibility/yoga stretching, and proprioreception therapy. He can talk more about this with dr Leretha Pol   04/14/23 id assessment Doing better off oxycodone and starting physical therapy and on buprenorphine patch with pain clinic since 12/2022. Also better appetite. ?opioids associated hyperalgesia?  I am happy he is better controlled for his symptoms No sign of abx failure or relapse clinically Will  see once year now -- end of this year 2024  No labs today  Continue pain clinic/pt-ot   Follow-up: Return in about 8 months (around 12/14/2023).     Raymondo Band, MD [facility] [phone]  pager   [phone] cell 04/14/2023, 8:42 AM

## 2023-04-15 DIAGNOSIS — M5451 Vertebrogenic low back pain: Secondary | ICD-10-CM | POA: Diagnosis not present

## 2023-04-19 ENCOUNTER — Other Ambulatory Visit: Payer: Self-pay | Admitting: Internal Medicine

## 2023-04-20 DIAGNOSIS — M5451 Vertebrogenic low back pain: Secondary | ICD-10-CM | POA: Diagnosis not present

## 2023-04-21 DIAGNOSIS — M4316 Spondylolisthesis, lumbar region: Secondary | ICD-10-CM | POA: Diagnosis not present

## 2023-04-22 DIAGNOSIS — M5451 Vertebrogenic low back pain: Secondary | ICD-10-CM | POA: Diagnosis not present

## 2023-04-28 ENCOUNTER — Ambulatory Visit: Payer: Medicare Other | Admitting: Neurology

## 2023-05-04 ENCOUNTER — Encounter: Payer: Self-pay | Admitting: Neurology

## 2023-05-04 ENCOUNTER — Ambulatory Visit (INDEPENDENT_AMBULATORY_CARE_PROVIDER_SITE_OTHER): Payer: Medicare Other | Admitting: Neurology

## 2023-05-04 VITALS — BP 143/78 | HR 71 | Ht 66.0 in | Wt 166.0 lb

## 2023-05-04 DIAGNOSIS — F1010 Alcohol abuse, uncomplicated: Secondary | ICD-10-CM | POA: Diagnosis not present

## 2023-05-04 DIAGNOSIS — R413 Other amnesia: Secondary | ICD-10-CM

## 2023-05-04 DIAGNOSIS — R269 Unspecified abnormalities of gait and mobility: Secondary | ICD-10-CM | POA: Diagnosis not present

## 2023-05-04 DIAGNOSIS — F32A Depression, unspecified: Secondary | ICD-10-CM

## 2023-05-04 DIAGNOSIS — M4316 Spondylolisthesis, lumbar region: Secondary | ICD-10-CM

## 2023-05-04 DIAGNOSIS — G4700 Insomnia, unspecified: Secondary | ICD-10-CM

## 2023-05-04 MED ORDER — MEMANTINE HCL ER 28 MG PO CP24: 28.0000 mg | ORAL_CAPSULE | Freq: Every day | ORAL | 3 refills | Status: DC

## 2023-05-04 NOTE — Patient Instructions (Signed)
Great to see you both today! I will refill your Namenda Return here for new or worsening symptoms Will send a note to Dr. Felipa Eth  Thanks!

## 2023-05-04 NOTE — Progress Notes (Signed)
Patient: Brandon Fleming Date of Birth: 1945-12-30  Reason for Visit: Follow up History from: Patient, wife  Primary Neurologist: [facility]  ASSESSMENT AND PLAN 77 y.o. year old male   1.  Mild memory disturbance 2.  Gait disturbance 3.  History of alcohol abuse 4.  Fatigue, insomnia 5.  Restless leg syndrome 6.  History of L3-4 lumbar surgery, subsequent staph infection requiring prolonged IV antibiotics  -Memory is overall stable, MMSE 29/30, will continue Namenda -Dr. Anne Hahn had started Prozac and Remeron, he has noticed excellent benefit, is on stable doses for the last several visits -He has history of alcohol abuse, currently still partakes in evening alcohol, I have discussed that we do not recommend alcohol with his current medications, at risk for being overly sedated and subsequent risk associated with such -His RLS symptoms have improved, after being prescribed pain patch, as well as taking oral iron for iron deficiency per PCP -We discussed returning to our office on an as-needed basis, transition refills to PCP to provide closer monitoring of mood management, I did suggest consultation with psychiatry, they are not interested, will return here on an as-needed basis for new issues, we had a nice conversation, they are agreeable to plan, will send a note to Dr. Felipa Eth  HISTORY OF PRESENT ILLNESS: Today 05/04/23 MMSE 29/30. Seeing pain management. Given low dose Butrans patch has helped his RLS. Still on Remeron for sleep. Dr. Felipa Eth has given something else starts with "T", hasn't needed. Feels memory is doing fine, depends on what he wants to do and remember. Drives around town. Has chronic back pain, is not very active. Finished PT. Keep follow up with ID due to history of staph infection with hardware. He doesn't eat much. Has gained 6 lbs in the last year. Still has 2 bourbon glasses that are full. Still on Prozac 20 mg. Has been better about taking his medications at  night. His mobility is limited due to back pain. Mood is okay, but frustrated about not being to play golf. Has had low iron, was given iron tablet.   HISTORY  12/16/22 Dr .Frances Furbish: I saw your patient, Brandon Fleming, upon your kind request in my neurologic clinic today for evaluation of his restless leg syndrome.  The patient is accompanied by his wife today.  As you know, Mr. Cappelletti is a 77 year old male with an underlying medical history of memory loss (followed in our clinic previously by Dr. Anne Hahn and most recently seen by nurse practitioner Margie Ege in May 2023), history of alcohol use disorder, anxiety, depression, gait disorder, hypertension, hyperlipidemia, reflux disease, lumbar degenerative disc disease with status post surgery in October 2023, who reports history of restless leg symptoms including aching sensation in his legs.  Symptoms often start in the morning, late morning around 11 AM and consist of aching sensation from his distal thighs down to the legs.  Symptoms are different from his previous back pain and are not like his sciatica pain.  The pain is bearable, he has more difficulty at night.  He does not sleep well.  He does snore per wife but she often does not sleep with him, they have separate bedrooms and only share a bedroom on vacation.  A few times a month he cannot sleep through the night.  Of note, he takes several high risk medications including gabapentin 300 mg twice daily, Remeron 30 mg at bedtime, Xtampza ER 18 mg daily, oxycodone as needed, he has not taken it regularly.  He also takes tramadol as needed.  Of note, he drinks alcohol daily, about 3 drinks of liquor per day.  His wife reports that he has stumbled several times and also fallen, thankfully without any major injuries.  He does not use a cane or walker but has both.  He does not hydrate well with water per wife, he estimates that he drinks up to 2 bottles of water per day, she believes it is less.  He does not  eat on a regular basis, typically only eats a full meal at dinner.   04/24/22 SS: Brandon Fleming is here today for follow-up.  He refused memory testing. Weight is up 2 lbs. Had surgery with Dr. Lovell Sheehan 08/14/21 L3-4 with hardware revision, got staph infection, required prolonged IV antibiotics. Working with ID. Dealing with pain, balances worsens in afternoon, uses walker. Is still driving. Doing better to take his medications, from [place]: Remeron, Prozac, Namenda. Still asking repetitive questioning, but memory is stable. Has hearing aids, doesn't wear them. Still going to the beach to visit grand kids. Isn't sleeping at night due to pain, does drink bourbon every night.   REVIEW OF SYSTEMS: Out of a complete 14 system review of symptoms, the patient complains only of the following symptoms, and all other reviewed systems are negative.  See HPI  ALLERGIES: Allergies  Allergen Reactions   Aricept [Donepezil Hcl] Diarrhea and Other (See Comments)    Diarrhea, stomach problems.    Atropine Swelling and Other (See Comments)    Eyes swollen and red   Other Other (See Comments)    Moisturizing eye drop - unknown    HOME MEDICATIONS: Outpatient Medications Prior to Visit  Medication Sig Dispense Refill   amLODipine-valsartan (EXFORGE) 10-160 MG tablet Take 1 tablet by mouth daily.     buprenorphine (BUTRANS) 5 MCG/HR PTWK 1 patch once a week.     cefadroxil (DURICEF) 500 MG capsule TAKE 1 CAPSULE BY MOUTH TWICE A DAY 180 capsule 3   COVID-19 mRNA vaccine 2023-2024 (COMIRNATY) syringe Inject into the muscle. 0.3 mL 0   docusate sodium (COLACE) 100 MG capsule Take 1 capsule (100 mg total) by mouth 2 (two) times daily. 10 capsule 0   fenofibrate (TRICOR) 145 MG tablet Take 145 mg by mouth daily.     FLUoxetine (PROZAC) 20 MG capsule TAKE 1 CAPSULE BY MOUTH EVERY DAY 90 capsule 2   folic acid (FOLVITE) 1 MG tablet Take 1 mg by mouth daily.     gabapentin (NEURONTIN) 300 MG capsule Take 300 mg by mouth  3 (three) times daily.     influenza vaccine adjuvanted (FLUAD) 0.5 ML injection Inject into the muscle. 0.5 mL 0   levothyroxine (SYNTHROID) 50 MCG tablet Take 50 mcg by mouth daily before breakfast.     memantine (NAMENDA XR) 28 MG CP24 24 hr capsule Take 1 capsule (28 mg total) by mouth daily. 90 capsule 3   mirtazapine (REMERON) 30 MG tablet TAKE 1 TABLET AT BEDTIME 90 tablet 1   Multiple Vitamins-Minerals (MULTIVITAMIN WITH MINERALS) tablet Take 1 tablet by mouth daily.     ondansetron (ZOFRAN) 4 MG tablet 1 tablet Orally PRN     pantoprazole (PROTONIX) 40 MG tablet Take 40 mg by mouth daily.     rosuvastatin (CRESTOR) 10 MG tablet Take 10 mg by mouth every morning.     RSV vaccine recomb adjuvanted (AREXVY) 120 MCG/0.5ML injection Inject into the muscle. 0.5 mL 0   sucralfate (CARAFATE) 1 g  tablet Take 1 g by mouth 2 (two) times daily.     thiamine 100 MG tablet Take 100 mg by mouth daily.     XTAMPZA ER 18 MG C12A Take 18 mg by mouth every 12 (twelve) hours.     Capsaicin-Menthol-Methyl Sal (CAPSAICIN-METHYL SAL-MENTHOL) 0.025-1-12 % CREA Apply 1 Application topically 3 (three) times daily as needed (for severe pain not released with pills). (Patient taking differently: Apply 1 Application topically as needed (for severe pain not released with pills).) 56.6 g 3   oxyCODONE-acetaminophen (PERCOCET/ROXICET) 5-325 MG tablet Take 1-2 tablets by mouth every 4 (four) hours as needed for moderate pain. (Patient taking differently: Take 1-2 tablets by mouth as needed for moderate pain.) 30 tablet 0   traMADol (ULTRAM) 50 MG tablet as needed. (Patient not taking: Reported on 04/14/2023)     No facility-administered medications prior to visit.    PAST MEDICAL HISTORY: Past Medical History:  Diagnosis Date   Alcohol abuse    Anxiety    Depression    Gait abnormality 04/08/2018   GERD (gastroesophageal reflux disease)    Hyperlipidemia    Hypertension    Kidney damage    Memory difficulties  04/08/2018    PAST SURGICAL HISTORY: Past Surgical History:  Procedure Laterality Date   BACK SURGERY     COLONOSCOPY     no polyps   COLONOSCOPY W/ POLYPECTOMY     EYE SURGERY     KNEE SURGERY Left 2011   fracture   LASER PHOTO ABLATION Left 05/18/2018   Procedure: LASER PHOTO ABLATION;  Surgeon: Sherrie George, MD;  Location: [facility] OR;  Service: Ophthalmology;  Laterality: Left;   LUMBAR WOUND DEBRIDEMENT N/A 08/26/2021   Procedure: LUMBAR WOUND DEBRIDEMENT;  Surgeon: Tressie Stalker, MD;  Location: [facility] OR;  Service: Neurosurgery;  Laterality: N/A;   PARS PLANA VITRECTOMY Left 05/18/2018   Procedure: PARS PLANA VITRECTOMY WITH 25G REMOVAL/SUTURE INTRAOCULAR LENS;  Surgeon: Sherrie George, MD;  Location: [facility] OR;  Service: Ophthalmology;  Laterality: Left;   VITRECTOMY Left 05/18/2018   PARS PLANA VITRECTOMY WITH 25G REMOVAL/SUTURE INTRAOCULAR LENS (Left)   X-STOP IMPLANTATION      FAMILY HISTORY: Family History  Problem Relation Age of Onset   Parkinson's disease Mother    CAD Father    Kidney disease Father    Anesthesia problems Neg Hx    Broken bones Neg Hx    Cancer Neg Hx    Clotting disorder Neg Hx    Collagen disease Neg Hx    Diabetes Neg Hx    Dislocations Neg Hx    Osteoporosis Neg Hx    Rheumatologic disease Neg Hx    Scoliosis Neg Hx    Severe sprains Neg Hx    Restless legs syndrome Neg Hx     SOCIAL HISTORY: Social History   Socioeconomic History   Marital status: Married    Spouse name: Meriam Sprague   Number of children: 1   Years of education: 16   Highest education level: Master's degree (e.g., MA, MS, MEng, MEd, MSW, MBA)  Occupational History   Occupation: Retired  Tobacco Use   Smoking status: Former    Types: Cigarettes    Quit date: 1978    Years since quitting: 46.4   Smokeless tobacco: Never  Vaping Use   Vaping Use: Never used  Substance and Sexual Activity   Alcohol use: Yes    Alcohol/week: 20.0 standard drinks of alcohol     Types: 20  Shots of liquor per week   Drug use: No   Sexual activity: Not on file  Other Topics Concern   Not on file  Social History Narrative   Lives at home with his wife.   Right-handed.   Caffeine- minimal use (4 ounces tea/soda per day)   Social Determinants of Health   Financial Resource Strain: Not on file  Food Insecurity: Not on file  Transportation Needs: Not on file  Physical Activity: Not on file  Stress: Not on file  Social Connections: Not on file  Intimate Partner Violence: Not on file    PHYSICAL EXAM  Vitals:   05/04/23 1240  BP: (!) 143/78  Pulse: 71  Weight: 166 lb (75.3 kg)  Height: 5\' 6"  (1.676 m)   Body mass index is 26.79 kg/m.    05/04/2023   12:43 PM 04/24/2022    8:43 AM 04/23/2021    8:47 AM  MMSE - Mini Mental State Exam  Not completed:  Refused   Orientation to time 5  5  Orientation to Place 5  5  Registration 3  3  Attention/ Calculation 4  5  Recall 3  3  Language- name 2 objects 2  2  Language- repeat 1  1  Language- follow 3 step command 3  1  Language- read & follow direction 1  1  Write a sentence 1  1  Copy design 1  1  Total score 29  28   Generalized: Well developed, in no acute distress  Neurological examination  Mentation: Alert oriented to time, place, history taking. Follows all commands speech and language fluent Cranial nerve II-XII: Pupils were equal round reactive to light. Extraocular movements were full, visual field were full on confrontational test. Facial sensation and strength were normal.  Head turning and shoulder shrug  were normal and symmetric. Motor: The motor testing reveals 5 over 5 strength of all 4 extremities. Good symmetric motor tone is noted throughout.  Sensory: Sensory testing is intact to soft touch on all 4 extremities. No evidence of extinction is noted.  Coordination: Cerebellar testing reveals good finger-nose-finger and heel-to-shin bilaterally.  Gait and station: Gait is wide-based,  cautious but independent. Reflexes: Deep tendon reflexes are symmetric and normal bilaterally.   DIAGNOSTIC DATA (LABS, IMAGING, TESTING) - I reviewed patient records, labs, notes, testing and imaging myself where available.  Lab Results  Component Value Date   WBC 4.5 09/12/2022   HGB 11.7 (L) 09/12/2022   HCT 35.1 (L) 09/12/2022   MCV 95.9 09/12/2022   PLT 180 09/12/2022      Component Value Date/Time   NA 139 09/12/2022 0940   K 4.8 09/12/2022 0940   CL 112 (H) 09/12/2022 0940   CO2 22 09/12/2022 0940   GLUCOSE 106 (H) 09/12/2022 0940   BUN 30 (H) 09/12/2022 0940   CREATININE 1.65 (H) 09/12/2022 0940   CREATININE 1.30 (H) 04/10/2022 1221   CALCIUM 9.3 09/12/2022 0940   PROT 6.3 (L) 09/12/2022 0940   ALBUMIN 3.1 (L) 09/12/2022 0940   AST 87 (H) 09/12/2022 0940   ALT 35 09/12/2022 0940   ALKPHOS 64 09/12/2022 0940   BILITOT 0.4 09/12/2022 0940   GFRNONAA 43 (L) 09/12/2022 0940   GFRAA >60 05/06/2018 0624   No results found for: "CHOL", "HDL", "LDLCALC", "LDLDIRECT", "TRIG", "CHOLHDL" Lab Results  Component Value Date   HGBA1C 4.8 08/27/2021   Lab Results  Component Value Date   VITAMINB12 335 02/26/2018   Lab  Results  Component Value Date   TSH 4.465 02/26/2018    Margie Ege, AGNP-C, DNP 05/04/2023, 12:51 PM [facility] [address], [address] [place], [place] [address] [phone]

## 2023-05-06 ENCOUNTER — Ambulatory Visit: Payer: Medicare Other | Admitting: Neurology

## 2023-05-18 DIAGNOSIS — M48061 Spinal stenosis, lumbar region without neurogenic claudication: Secondary | ICD-10-CM | POA: Diagnosis not present

## 2023-06-18 ENCOUNTER — Other Ambulatory Visit: Payer: Self-pay | Admitting: Neurology

## 2023-08-13 DIAGNOSIS — M48061 Spinal stenosis, lumbar region without neurogenic claudication: Secondary | ICD-10-CM | POA: Diagnosis not present

## 2023-08-25 DIAGNOSIS — E785 Hyperlipidemia, unspecified: Secondary | ICD-10-CM | POA: Diagnosis not present

## 2023-08-25 DIAGNOSIS — N1832 Chronic kidney disease, stage 3b: Secondary | ICD-10-CM | POA: Diagnosis not present

## 2023-08-25 DIAGNOSIS — I129 Hypertensive chronic kidney disease with stage 1 through stage 4 chronic kidney disease, or unspecified chronic kidney disease: Secondary | ICD-10-CM | POA: Diagnosis not present

## 2023-08-28 ENCOUNTER — Other Ambulatory Visit (HOSPITAL_BASED_OUTPATIENT_CLINIC_OR_DEPARTMENT_OTHER): Payer: Self-pay

## 2023-08-28 MED ORDER — FLUAD 0.5 ML IM SUSY
0.5000 mL | PREFILLED_SYRINGE | Freq: Once | INTRAMUSCULAR | 0 refills | Status: AC
  Filled 2023-08-28: qty 0.5, 1d supply, fill #0

## 2023-08-28 MED ORDER — COMIRNATY 30 MCG/0.3ML IM SUSY
0.3000 mL | PREFILLED_SYRINGE | Freq: Once | INTRAMUSCULAR | 0 refills | Status: AC
  Filled 2023-08-28: qty 0.3, 1d supply, fill #0

## 2023-09-11 DIAGNOSIS — Z86010 Personal history of colonic polyps: Secondary | ICD-10-CM | POA: Diagnosis not present

## 2023-09-11 DIAGNOSIS — R1013 Epigastric pain: Secondary | ICD-10-CM | POA: Diagnosis not present

## 2023-11-04 ENCOUNTER — Other Ambulatory Visit (HOSPITAL_COMMUNITY): Payer: Self-pay | Admitting: Registered Nurse

## 2023-11-04 DIAGNOSIS — R634 Abnormal weight loss: Secondary | ICD-10-CM

## 2023-11-04 DIAGNOSIS — R630 Anorexia: Secondary | ICD-10-CM

## 2023-11-04 DIAGNOSIS — R945 Abnormal results of liver function studies: Secondary | ICD-10-CM

## 2023-11-04 DIAGNOSIS — F1029 Alcohol dependence with unspecified alcohol-induced disorder: Secondary | ICD-10-CM

## 2023-11-05 ENCOUNTER — Ambulatory Visit (HOSPITAL_COMMUNITY)
Admission: RE | Admit: 2023-11-05 | Discharge: 2023-11-05 | Disposition: A | Discharge status: Home / Self Care | Payer: Medicare Other | Source: Ambulatory Visit | Attending: Registered Nurse | Admitting: Registered Nurse

## 2023-11-05 DIAGNOSIS — R630 Anorexia: Secondary | ICD-10-CM | POA: Insufficient documentation

## 2023-11-05 DIAGNOSIS — K7689 Other specified diseases of liver: Secondary | ICD-10-CM | POA: Diagnosis not present

## 2023-11-05 DIAGNOSIS — F1029 Alcohol dependence with unspecified alcohol-induced disorder: Secondary | ICD-10-CM | POA: Insufficient documentation

## 2023-11-05 DIAGNOSIS — R634 Abnormal weight loss: Secondary | ICD-10-CM | POA: Insufficient documentation

## 2023-11-05 DIAGNOSIS — R945 Abnormal results of liver function studies: Secondary | ICD-10-CM | POA: Diagnosis present

## 2023-11-10 ENCOUNTER — Encounter (INDEPENDENT_AMBULATORY_CARE_PROVIDER_SITE_OTHER): Payer: Self-pay

## 2023-11-10 ENCOUNTER — Ambulatory Visit (INDEPENDENT_AMBULATORY_CARE_PROVIDER_SITE_OTHER): Payer: Medicare Other | Admitting: Otolaryngology

## 2023-11-10 ENCOUNTER — Ambulatory Visit (INDEPENDENT_AMBULATORY_CARE_PROVIDER_SITE_OTHER): Payer: Medicare Other | Admitting: Audiology

## 2023-11-10 VITALS — Ht 66.0 in | Wt 150.0 lb

## 2023-11-10 DIAGNOSIS — H903 Sensorineural hearing loss, bilateral: Secondary | ICD-10-CM

## 2023-11-10 NOTE — Progress Notes (Signed)
Patient ID: Brandon Fleming, male   DOB: 1946/10/12, 77 y.o.   MRN: [id number]  Follow up: Asymmetric hearing loss  HPI: The patient is a 77 year old male who returns today for his follow-up evaluation.  The patient was previously seen for asymmetric right ear hearing loss.  He was last seen 1 year ago.  At that time, he was noted to have bilateral high-frequency sensorineural hearing loss, worse on the right side.  The patient was fitted with hearing aids.  He returns today reporting no significant change in his hearing.  He is wearing bilateral hearing aids.  He denies any otalgia, otorrhea, or vertigo.  Exam: General: Communicates without difficulty, well nourished, no acute distress. Head: Normocephalic, no evidence injury, no tenderness, facial buttresses intact without stepoff. Eyes: PERRL, EOMI. No scleral icterus, conjunctivae clear. Neuro: CN II exam reveals vision grossly intact. No nystagmus at any point of gaze. Ears: Auricles well formed without lesions. Ear canals are intact without mass or lesion. No erythema or edema is appreciated. The TMs are intact without fluid. Nose: External evaluation reveals normal support and skin without lesions. Dorsum is intact. Anterior rhinoscopy reveals healthy pink mucosa over anterior aspect of inferior turbinates and intact septum. No purulence noted. Oral:  Oral cavity and oropharynx are intact, symmetric, without erythema or edema. Mucosa is moist without lesions. Neck: Full range of motion without pain. There is no significant lymphadenopathy. No masses palpable. Thyroid bed within normal limits to palpation. Parotid glands and submandibular glands equal bilaterally without mass. Trachea is midline. Neuro:  CN 2-12 grossly intact. Gait normal. Vestibular: No nystagmus at any point of gaze. The cerebellar examination is unremarkable.   AUDIOMETRIC TESTING: I have read and reviewed the audiometric test, which shows bilateral high-frequency sensorineural  hearing loss with stable asymmetry on the right.   Assessment 1. The patient's ear canals, tympanic membranes and middle ear spaces are all normal.  2. Bilateral high-frequency sensorineural hearing loss with stable asymmetry on the right.   Plan 1. The physical exam and hearing test findings are reviewed with the patient.  2. Continue the use of his hearing aids. 3. The small possibility of a retrocochlear lesion causing his asymmetric hearing loss is discussed. The patient would like to proceed with conservative observation for now.  4. The patient will follow up for re-evaluation in 12 months, sooner if needed.

## 2023-11-11 NOTE — Progress Notes (Signed)
  [address] [place], [place] [address] [phone]  Audiological Evaluation    Name: Brandon Fleming     DOB:   1946/11/16      MRN:   [id number]                                                                                     Service Date: 11/11/2023     Accompanied: yes   Patient comes today after Dr. Suszanne Conners, ENT sent a referral for a hearing evaluation due to concerns with hearing loss.   Symptoms Yes Details  Hearing loss  []    Tinnitus  [x]  Occasional in both ears- longstanding  Ear pain/ Ear infections  []    Balance problems  []    Noise exposure  []    Previous ear surgeries  []    Family history  []    Amplification  [x]  Has a set of hearing aids that were fit at Dr. [facility] clinic.  Other  []      Otoscopy: Right ear: Clear external ear canals and notable landmarks visualized on the tympanic membrane. Left ear:  Clear external ear canals and notable landmarks visualized on the tympanic membrane.  Tympanometry: Right ear: Type A- Normal external ear canal volume with normal middle ear pressure and tympanic membrane compliance. Left ear: Type A- Normal external ear canal volume with normal middle ear pressure and tympanic membrane compliance.  Pure tone Audiometry: Right ear- Normal hearing from [phone] Hz, then moderate to moderately severe sensorineural hearing loss from 3000-8000 Hz.   Left ear- Normal hearing from [phone], then mild to moderate sensorineural hearing loss from 4000-8000 Hz.   The hearing test results were completed under headphones and results are deemed to be of good reliability. Test technique:  conventional     Speech Audiometry: Right ear- Speech Reception Threshold (SRT) was obtained at 15 dBHL. Left ear-Speech Reception Threshold (SRT) was obtained at 15 dBHL.   Word Recognition Score Tested using NU-6 (MLV) Right ear: 100% was obtained at a presentation level of 65 dBHL with contralateral masking which is deemed as   excellent. Left ear: 96% was obtained at a presentation level of 65 dBHL with contralateral masking which is deemed as  excellent.    Impression: There significant difference in pure-tone thresholds between ears continues to be observed, worse in the right ear. There is not a significant difference in the word recognition score in between ears.    Recommendations: Follow up with ENT as scheduled for today. Return for a hearing evaluation if concerns with hearing changes arise or per MD recommendation. Continue with hearing aid use.   Dariel Betzer MARIE LEROUX-MARTINEZ, AUD

## 2023-11-16 DIAGNOSIS — M48061 Spinal stenosis, lumbar region without neurogenic claudication: Secondary | ICD-10-CM | POA: Diagnosis not present

## 2023-11-16 DIAGNOSIS — Z5181 Encounter for therapeutic drug level monitoring: Secondary | ICD-10-CM | POA: Diagnosis not present

## 2023-11-17 DIAGNOSIS — M4316 Spondylolisthesis, lumbar region: Secondary | ICD-10-CM | POA: Diagnosis not present

## 2023-11-24 ENCOUNTER — Ambulatory Visit (INDEPENDENT_AMBULATORY_CARE_PROVIDER_SITE_OTHER): Payer: Medicare Other | Admitting: Internal Medicine

## 2023-11-24 ENCOUNTER — Encounter: Payer: Self-pay | Admitting: Internal Medicine

## 2023-11-24 ENCOUNTER — Other Ambulatory Visit: Payer: Self-pay

## 2023-11-24 VITALS — BP 119/77 | HR 77 | Temp 97.8°F | Ht 66.0 in | Wt 153.0 lb

## 2023-11-24 DIAGNOSIS — T847XXD Infection and inflammatory reaction due to other internal orthopedic prosthetic devices, implants and grafts, subsequent encounter: Secondary | ICD-10-CM

## 2023-11-24 DIAGNOSIS — M4620 Osteomyelitis of vertebra, site unspecified: Secondary | ICD-10-CM

## 2023-11-24 NOTE — Patient Instructions (Signed)
I think your back is doing great  Remember if whenever you want to trial off antibiotics we can give it a try with caution. For now we are opting to continue antibiotics suppression   Labs today   I think with your recent illness the liver enzyme and the crp (inflammation level) might be a little high but with how you feel now I have no concern   F/u 1 year or sooner as needed

## 2023-11-24 NOTE — Progress Notes (Signed)
[facility]  Brandon Fleming Active Problem List   Diagnosis Date Noted   Sensorineural hearing loss, bilateral 11/10/2023   Depression 05/04/2023   Pain in right shoulder 10/27/2022   Pain in right wrist 10/27/2022   Wound infection complicating hardware (HCC) 08/26/2021   Postoperative complication of skin involving drainage from surgical wound 08/26/2021   Lumbar adjacent segment disease with spondylolisthesis 08/14/2021   Insomnia 04/19/2020   Dislocated intraocular lens 05/18/2018   Dislocated intraocular lens, sequela 05/05/2018   Spondylolisthesis of lumbar region 05/05/2018   Memory difficulties 04/08/2018   Gait abnormality 04/08/2018   Alcohol withdrawal (HCC) 02/28/2018   Weakness 02/26/2018   Essential hypertension 02/26/2018   HLD (hyperlipidemia) 02/26/2018   Tremors of nervous system 02/26/2018   Alcohol abuse 02/26/2018   Weakness generalized 02/26/2018      Subjective:    Brandon Fleming ID: Brandon Fleming, male    DOB: 1946-04-18, 77 y.o.   MRN: [id number]  Chief Complaint  Brandon Fleming presents with   Follow-up     Cc -- f/u lumbar spine om complicated by hardware presence  HPI:  Brandon Fleming is a 77 y.o. male hx l4-5 fusion 2019 with recurrent back pain/neurogenic claudication, s/p on 08/14/21 L3-4 laminotomy/facetectomy (complex surgical procedure due to prior lumbar interbody fusion), right L3-4 interbody fusion and zimmer DBM and insertion interbody prosthesis, posterior L3-5 instrumentation, however complicated by early surgical site infection admitted 08/26/21 for I&D with retention hardware cx growing mssa   I saw paitent during admission 08/26/21.  Reviewed 9/12 operative note. Lot of pus in sq space; compromised lumbosacral fascia with pus tracking down to spine/hardware. Wound cx mssa. Hardware left in place. No blood cx obtained He had a rash while on cefepime/vanc, but tolerated cefazolin Discharged on rifampin and  cefazolin Plan 6 weeks cefazolin until 10/24 then to transition to cefadroxil for chronic suppression along with 3 months rifampin   ------ 10/04 id clinic f/u Brandon Fleming had 2 weeks burning 4 lesions on his forehead; was skin color then turned erythematous. No vessicles. Also decreased appetite, generalized weakness since discharge, worse Pain same in the back; severe. Difficulty getting around. He gets around though with FWW No LE neurological deficit or urinary bowel incontinence No n/v/diarrhea No joint pain muscle ache otherwise Feels cold all the time No fever No sorethroat/cough No sick contact  [facility] labs reviewed Crp up the past week Lft up  Hct down  10/26 id f/u Shingle on forehead had resolved Still with poor appetite, malaise New legs edema -- concerned if cefazolin can do that There is occasional serosanguinous stain at incision. Pain stable mild-moderate. Brandon Fleming ambulatory No fever, chill No diarrhea, n/v  Labs not available  11/18 id f/u Doing really well Wound all closed  Tolerate oral cefadroxil which was started 10/26... no further fatigue No gi sx  02/11/22 id f/u Hasn't taken abx for the last 8 days as he wasn't feeling well. Started out as nausea/epigastric discomfort, then also loose stool to diarrhea progressing to tarry black stool Has not eating much if at all and there are associated fatigue, wobbly gait. No fever, chill No sick contact The stool diarrhea frequency has decreased although he questions if it is due to decreased intake. Stool is soft and more formed Has hx of esophageal dilatation due to stricture. This was last done early spring 2022 Hasn't taken any prednisone or nsaids leading up this   No new  back pain   3/09 id f/u Reviewed labs from 2/28 -- mild aki He was seen by GI right a way and Brandon Fleming's report there was lots of inflammation. Given some kind of pills to take twice a day  He said zofran helping as well with  nausea Eating a little better Overall feeling better with strength as well  No more black stool  Back on antibiotics but most days can only do once a day   4/27 id f/u Brandon Fleming doing very well. Not having much nausea Still occasional fall due to bourbon drinking No f/c Eating at least 1 meal a day No weight loss   06/19/22 id f/u Brandon Fleming doing well, eating better Every now and then (every 5th night or so) has severe back pain that his current meds don't help much but other occasions they do help.... He is taking xtampza (oxycodone ER), prn tramadol No fever, chill He is taking memantine for his mild dementia.   Reviewed meds and he is on tylenol pm as well which I asked him to discuss with neurology if this would be in his best benefit with the dementia   01/13/23 id f/u Brandon Fleming had worsening pain to the point he couldn't walk so he underwent another lumbar spine surgery/laminectomy along with revision of prior lumbar fusion. I reviewed op note by dr Lovell Sheehan in 10/2022  He initially does ok with much less pain, but then it has been getting worse again. Constant moderate pain. He is able to function though  He continues on cefadroxil 500 bid No n/v/diarrhea/rash  No f/c   04/14/23 id f/u I had wanted to see him a little earlier due to increased pain last fall and last visit He had since doing twice a week pt/ot and gone off oxycodone, then tramadol, and now on buprenorphine patch. He has been seeing pain clinic since 12/2022 Pain is better off oxycodone No f/c Very functional Appetite better since off oxycodone as well No n/v/diarrhea Taking cefadroxil 500 bid  11/24/23 id f/u See a&p  Allergies  Allergen Reactions   Aricept [Donepezil Hcl] Diarrhea and Other (See Comments)    Diarrhea, stomach problems.    Atropine Swelling and Other (See Comments)    Eyes swollen and red   Other Other (See Comments)    Moisturizing eye drop - unknown      Outpatient  Medications Prior to Visit  Medication Sig Dispense Refill   amLODipine-valsartan (EXFORGE) 10-160 MG tablet Take 1 tablet by mouth daily.     buprenorphine (BUTRANS) 5 MCG/HR PTWK 1 patch once a week.     cefadroxil (DURICEF) 500 MG capsule TAKE 1 CAPSULE BY MOUTH TWICE A DAY 180 capsule 3   docusate sodium (COLACE) 100 MG capsule Take 1 capsule (100 mg total) by mouth 2 (two) times daily. 10 capsule 0   fenofibrate (TRICOR) 145 MG tablet Take 145 mg by mouth daily.     FLUoxetine (PROZAC) 20 MG capsule TAKE 1 CAPSULE BY MOUTH EVERY DAY 90 capsule 2   folic acid (FOLVITE) 1 MG tablet Take 1 mg by mouth daily.     gabapentin (NEURONTIN) 300 MG capsule Take 300 mg by mouth 3 (three) times daily.     levothyroxine (SYNTHROID) 50 MCG tablet Take 50 mcg by mouth daily before breakfast.     memantine (NAMENDA XR) 28 MG CP24 24 hr capsule Take 1 capsule (28 mg total) by mouth daily. 90 capsule 3   mirtazapine (REMERON) 30 MG  tablet TAKE 1 TABLET BY MOUTH EVERYDAY AT BEDTIME 90 tablet 1   Multiple Vitamins-Minerals (MULTIVITAMIN WITH MINERALS) tablet Take 1 tablet by mouth daily.     ondansetron (ZOFRAN) 4 MG tablet 1 tablet Orally PRN     pantoprazole (PROTONIX) 40 MG tablet Take 40 mg by mouth daily.     rosuvastatin (CRESTOR) 10 MG tablet Take 10 mg by mouth every morning.     RSV vaccine recomb adjuvanted (AREXVY) 120 MCG/0.5ML injection Inject into the muscle. 0.5 mL 0   sucralfate (CARAFATE) 1 g tablet Take 1 g by mouth 2 (two) times daily.     thiamine 100 MG tablet Take 100 mg by mouth daily.     XTAMPZA ER 18 MG C12A Take 18 mg by mouth every 12 (twelve) hours.     COVID-19 mRNA vaccine 2023-2024 (COMIRNATY) syringe Inject into the muscle. (Brandon Fleming not taking: Reported on 11/10/2023) 0.3 mL 0   influenza vaccine adjuvanted (FLUAD) 0.5 ML injection Inject into the muscle. (Brandon Fleming not taking: Reported on 11/10/2023) 0.5 mL 0   No facility-administered medications prior to visit.      Social History   Socioeconomic History   Marital status: Married    Spouse name: Meriam Sprague   Number of children: 1   Years of education: 16   Highest education level: Master's degree (e.g., MA, MS, MEng, MEd, MSW, MBA)  Occupational History   Occupation: Retired  Tobacco Use   Smoking status: Former    Current packs/day: 0.00    Types: Cigarettes    Quit date: 1978    Years since quitting: 46.9   Smokeless tobacco: Never  Vaping Use   Vaping status: Never Used  Substance and Sexual Activity   Alcohol use: Yes    Alcohol/week: 20.0 standard drinks of alcohol    Types: 20 Shots of liquor per week   Drug use: No   Sexual activity: Not on file  Other Topics Concern   Not on file  Social History Narrative   Lives at home with his wife.   Right-handed.   Caffeine- minimal use (4 ounces tea/soda per day)   Social Determinants of Health   Financial Resource Strain: Not on file  Food Insecurity: Not on file  Transportation Needs: Not on file  Physical Activity: Not on file  Stress: Not on file  Social Connections: Not on file  Intimate Partner Violence: Not on file      Review of Systems    All other ros negative  Objective:    BP 119/77   Pulse 77   Temp 97.8 F (36.6 C) (Temporal)   Ht 5\' 6"  (1.676 m)   Wt 153 lb (69.4 kg)   SpO2 99%   BMI 24.69 kg/m  Nursing note and vital signs reviewed.  Physical Exam     General/constitutional: no distress, pleasant HEENT: Normocephalic, PER, Conj Clear, EOMI, Oropharynx clear Neck supple CV: rrr no mrg Lungs: clear to auscultation, normal respiratory effort Abd: Soft, Nontender Ext: no edema Skin: No Rash Neuro: nonfocal MSK: no peripheral joint swelling/tenderness/warmth; back spines nontender           Labs: Lab Results  Component Value Date   WBC 4.5 09/12/2022   HGB 11.7 (L) 09/12/2022   HCT 35.1 (L) 09/12/2022   MCV 95.9 09/12/2022   PLT 180 09/12/2022   Last metabolic panel Lab  Results  Component Value Date   GLUCOSE 106 (H) 09/12/2022   NA 139 09/12/2022  K 4.8 09/12/2022   CL 112 (H) 09/12/2022   CO2 22 09/12/2022   BUN 30 (H) 09/12/2022   CREATININE 1.65 (H) 09/12/2022   EGFR 58 (L) 04/10/2022   GFRNONAA 43 (L) 09/12/2022   CALCIUM 9.3 09/12/2022   PROT 6.3 (L) 09/12/2022   ALBUMIN 3.1 (L) 09/12/2022   BILITOT 0.4 09/12/2022   ALKPHOS 64 09/12/2022   AST 87 (H) 09/12/2022   ALT 35 09/12/2022   ANIONGAP 5 09/12/2022    [facility]: 10/25 cbc 4/8.2/177; cr 0.98 10/18 cbc 6/8.6/251; cr 1.02 9/27 cbc 6/8/225 normal diff; cr 1.04 9/19 cbc 9/9/332; cr 1.02; lft 88/17/151/0.7  Crp: 10/25    16 (<10) 10/18    13 (<10) 9/29    106 (<10) 9/19      52 (<10)  Micro:  Serology:  Imaging:  Assessment & Plan:   Problem List Items Addressed This Visit   None No orders of the defined types were placed in this encounter.   Abx: 10/26-c cefadroxil 500 mg bid dosingsince 04/10/2022  9/15-10/26 cefazolin 9/17-10/04 rifampin 300 mg po bid   08/26/21-15 vanc 08/26/21-15 cefepime   ASSESSMENT: Surgical site infection/vertebral om lumbar -- mssa s/p I&D 08/26/2021 Hardware complicating wound infection Rash suspect abx allergy to cefepime, less likely vanc   77 yo male hx l4-5 fusion 2019 with recurrent back pain/neurogenic claudication, s/p on 8/31 L3-4 laminotomy/facetectomy (complex surgical procedure due to prior lumbar intervody fusion), right L3-4 interbody fusion and zimmer DBM and insertion interbody prosthesis, posterior L3-5 instrumentation, however complicated most recently by early surgical site infection admitted 9/12 for I&D with retention hardware. Brandon Fleming here for id clinic f/u   Reviewed 9/12 operative note. Hardware retained. Lot of pus in sq space; compromised lumbosacral fascia with pus tracking down to spine/hardware. Wound cx mssa   No sepsis this admission otherwise. No bcx obtained.   Given recent hardware placement <30 days,  discharged on combination abx with rifampin  Developed rash on vanc/cefepime. Did ok on cefazolin though  ------- 10/4 id clinic f/u Fatigue/generalized weakness/poor apptite likely abx combination and ongoing pain/pain meds (taking extended oxycodone currently). Hct down and lft up considered rifampin side effect Also has a forehead lesion that is suggestive of shingle. ?cause for crp elevation Pain in lower back severe but stable without neurologic deficit. Incision intact. Brandon Fleming to discuss pain management with nsg today  At this time, will stop rifampin and continue following labs Will see back in around 3 weeks for follow up   10/26 id clinic assessment Crp down Some intermittent serosanguinous stain at the incision but no dehiscence; slight erythema around it but ?related to tape/dressing  Fatigue/poor appetite likely multifactorial, low hemoglobin, infection, and ?as below suggested   New legs edema/ongoing fatigue query chf. No previous echo available; no hx chf. Needs chf evaluation. Cefazolin not typical to cause edema  11/18 id assessment Doing well today on oral cefadroxil. Sx from previous 2 clinic visits (fatigue/leg edema) no longer present. Not really sure what it was although temporally related to iv abx use and rifampin  Plan indefinite cefadroxil, maybe decreased dose in another 2-3 months depending on clinical status/improvement  2/28 id assessment Gi sx / tarry stool ?PUD with slow upper gib? Diarrhea slowed down since off abx -- might not be related given duration of time he has been on abx. Doesn't seem to be cdiff Discuss importance of staying on abx due to metals/back infection Also considered dehydration related acute renal injury causing more  nausea  3/09 id assessment Clinically improving with upper gi sx Not taking abx as prescribed - advise him this and the ?presumed acid blocker given by GI should be his top priority   04/10/22 id  assessment Tolerating 1 gram cefadroxil He is at least 6 months out and clinically doing well (went to [place] a week prior to this clinic) It would be reasonable to do 500 mg twice a day We also need lab today   06/19/22 id assessment Doing well will forgo lab today Pain -- in setting of dementia advise him to talk with neurology regarding choices of drugs given his dementia  I can trial him on topical capsaicin cream and see if helps with severe pain as needed Neuropathic pain component blocker will let him discuss with neurology first Advise him to talk about benadryl (tylenol pm) use with neurology as wel Continue cefadroxil 500 mg po bid   He has [employer] disabled parking app to be dropped off with me for me to sign   01/13/23 id assessment Continues to do well. Has another lumbar spine revision surgery for worsening LE pain. No pus or sign of hardware loosening during operative finding Will continue on cefadroxil indefinitely  See me again in 3 months; will do labs then  He asked about physical therapy. He'll see dr Lovell Sheehan again soon. I do not see issue with core muscle strengthening, flexibility/yoga stretching, and proprioreception therapy. He can talk more about this with dr Leretha Pol   04/14/23 id assessment Doing better off oxycodone and starting physical therapy and on buprenorphine patch with pain clinic since 12/2022. Also better appetite. ?opioids associated hyperalgesia?  I am happy he is better controlled for his symptoms No sign of abx failure or relapse clinically Will see once year now -- end of this year 2024  No labs today  Continue pain clinic/pt-ot   11/24/23 id assessment Recent illness a month ago with elevated lft per Brandon Fleming and malaise/poor apetite/weight loss. Back to normal for a week now. Going to gym. Mild back pain and have pain patch. See pain management and dr Lovell Sheehan for nsg f/u  Taking cefadroxil 500 bid. Discuss trial off to assess for cure, but  Brandon Fleming wants to stay on  Labs today  I suspect with recent illness crp and lft could be high but clinically from the back standpoing I can see him in about a year    Follow-up: Return in about 1 year (around 11/23/2024).     Raymondo Band, MD [facility] [phone]  pager   [phone] cell 11/24/2023, 8:56 AM

## 2023-11-25 LAB — CBC WITH DIFFERENTIAL/PLATELET
Absolute Lymphocytes: 1015 {cells}/uL (ref 850–3900)
Absolute Monocytes: 306 {cells}/uL (ref 200–950)
Basophils Absolute: 22 {cells}/uL (ref 0–200)
Basophils Relative: 0.6 %
Eosinophils Absolute: 536 {cells}/uL — ABNORMAL HIGH (ref 15–500)
Eosinophils Relative: 14.9 %
HCT: 34.7 % — ABNORMAL LOW (ref 38.5–50.0)
Hemoglobin: 11.1 g/dL — ABNORMAL LOW (ref 13.2–17.1)
MCH: 31.5 pg (ref 27.0–33.0)
MCHC: 32 g/dL (ref 32.0–36.0)
MCV: 98.6 fL (ref 80.0–100.0)
MPV: 12.3 fL (ref 7.5–12.5)
Monocytes Relative: 8.5 %
Neutro Abs: 1721 {cells}/uL (ref 1500–7800)
Neutrophils Relative %: 47.8 %
Platelets: 176 10*3/uL (ref 140–400)
RBC: 3.52 10*6/uL — ABNORMAL LOW (ref 4.20–5.80)
RDW: 12.3 % (ref 11.0–15.0)
Total Lymphocyte: 28.2 %
WBC: 3.6 10*3/uL — ABNORMAL LOW (ref 3.8–10.8)

## 2023-11-25 LAB — COMPLETE METABOLIC PANEL WITH GFR
AG Ratio: 1.5 (calc) (ref 1.0–2.5)
ALT: 21 U/L (ref 9–46)
AST: 31 U/L (ref 10–35)
Albumin: 3.5 g/dL — ABNORMAL LOW (ref 3.6–5.1)
Alkaline phosphatase (APISO): 125 U/L (ref 35–144)
BUN/Creatinine Ratio: 19 (calc) (ref 6–22)
BUN: 29 mg/dL — ABNORMAL HIGH (ref 7–25)
CO2: 24 mmol/L (ref 20–32)
Calcium: 9.7 mg/dL (ref 8.6–10.3)
Chloride: 112 mmol/L — ABNORMAL HIGH (ref 98–110)
Creat: 1.5 mg/dL — ABNORMAL HIGH (ref 0.70–1.28)
Globulin: 2.3 g/dL (ref 1.9–3.7)
Glucose, Bld: 147 mg/dL — ABNORMAL HIGH (ref 65–99)
Potassium: 5.2 mmol/L (ref 3.5–5.3)
Sodium: 143 mmol/L (ref 135–146)
Total Bilirubin: 0.5 mg/dL (ref 0.2–1.2)
Total Protein: 5.8 g/dL — ABNORMAL LOW (ref 6.1–8.1)
eGFR: 48 mL/min/{1.73_m2} — ABNORMAL LOW (ref >=60)

## 2023-11-25 LAB — C-REACTIVE PROTEIN: CRP: 4.6 mg/L (ref <8.0)

## 2023-11-26 DIAGNOSIS — R630 Anorexia: Secondary | ICD-10-CM | POA: Diagnosis not present

## 2023-11-26 DIAGNOSIS — R7989 Other specified abnormal findings of blood chemistry: Secondary | ICD-10-CM | POA: Diagnosis not present

## 2023-11-26 DIAGNOSIS — R1013 Epigastric pain: Secondary | ICD-10-CM | POA: Diagnosis not present

## 2023-11-26 DIAGNOSIS — Z860100 Personal history of colon polyps, unspecified: Secondary | ICD-10-CM | POA: Diagnosis not present

## 2023-12-12 ENCOUNTER — Other Ambulatory Visit: Payer: Self-pay | Admitting: Neurology

## 2024-01-16 DIAGNOSIS — Z5181 Encounter for therapeutic drug level monitoring: Secondary | ICD-10-CM | POA: Diagnosis not present

## 2024-01-26 DIAGNOSIS — H33301 Unspecified retinal break, right eye: Secondary | ICD-10-CM | POA: Diagnosis not present

## 2024-01-26 DIAGNOSIS — H353111 Nonexudative age-related macular degeneration, right eye, early dry stage: Secondary | ICD-10-CM | POA: Diagnosis not present

## 2024-01-26 DIAGNOSIS — Z961 Presence of intraocular lens: Secondary | ICD-10-CM | POA: Diagnosis not present

## 2024-01-26 DIAGNOSIS — H5213 Myopia, bilateral: Secondary | ICD-10-CM | POA: Diagnosis not present

## 2024-01-28 DIAGNOSIS — N1832 Chronic kidney disease, stage 3b: Secondary | ICD-10-CM | POA: Diagnosis not present

## 2024-01-28 DIAGNOSIS — R7301 Impaired fasting glucose: Secondary | ICD-10-CM | POA: Diagnosis not present

## 2024-01-28 DIAGNOSIS — Z125 Encounter for screening for malignant neoplasm of prostate: Secondary | ICD-10-CM | POA: Diagnosis not present

## 2024-01-28 DIAGNOSIS — E039 Hypothyroidism, unspecified: Secondary | ICD-10-CM | POA: Diagnosis not present

## 2024-01-28 DIAGNOSIS — Z1212 Encounter for screening for malignant neoplasm of rectum: Secondary | ICD-10-CM | POA: Diagnosis not present

## 2024-01-28 DIAGNOSIS — E785 Hyperlipidemia, unspecified: Secondary | ICD-10-CM | POA: Diagnosis not present

## 2024-01-28 DIAGNOSIS — I129 Hypertensive chronic kidney disease with stage 1 through stage 4 chronic kidney disease, or unspecified chronic kidney disease: Secondary | ICD-10-CM | POA: Diagnosis not present

## 2024-02-02 ENCOUNTER — Other Ambulatory Visit (INDEPENDENT_AMBULATORY_CARE_PROVIDER_SITE_OTHER): Payer: Medicare Other

## 2024-02-02 ENCOUNTER — Encounter: Payer: Self-pay | Admitting: Physician Assistant

## 2024-02-02 ENCOUNTER — Ambulatory Visit (INDEPENDENT_AMBULATORY_CARE_PROVIDER_SITE_OTHER): Payer: Medicare Other | Admitting: Physician Assistant

## 2024-02-02 DIAGNOSIS — M25551 Pain in right hip: Secondary | ICD-10-CM

## 2024-02-02 NOTE — Progress Notes (Signed)
 Office Visit Note   Patient: Brandon Fleming           Date of Birth: 04-26-1946           MRN: [id number] Visit Date: 02/02/2024              Requested by: Chilton Greathouse, MD [address] [place],  [place] [address] PCP: Chilton Greathouse, MD   Assessment & Plan: Visit Diagnoses:  1. Pain in right hip     Plan: Patient has a history of increasing right groin pain and hip pain.  He has had this for a while but it is gotten worse of late.  Denies any injuries or falls.  X-rays today do demonstrate some arthritis in his right hip.  He does have fairly good motion of the hip.  He has a long history of back issues with surgery but I find no radicular symptoms today.  He is caring for his wife who just got diagnosed with pancreatic cancer.  Would like to work with her schedule so he can get in and get an injection.  Have sent a request to [facility] to see if he could do this on the 28th  Follow-Up Instructions: No follow-ups on file.   Orders:  Orders Placed This Encounter  Procedures   XR HIP UNILAT W OR W/O PELVIS 2-3 VIEWS RIGHT   No orders of the defined types were placed in this encounter.     Procedures: No procedures performed   Clinical Data: No additional findings.   Subjective: Chief Complaint  Patient presents with   Right Leg - Pain   Left Leg - Pain    HPI Patient is a pleasant 78 year old gentleman with a chief complaint of right greater than left hip pain.  Says it flares up at night and cannot sleep.  Progressively has gotten worse.  She does have a history of back issues but this does not feel like his back pain.  Points to pain in the front of the hip sometimes shoots down his right leg but no numbness or tingling.  He normally walks on a treadmill daily but because of his pain he is had to stop this.  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  Uses a pain patch or Tylenol to sleep.  Enjoys walking on the treadmill for 60 minutes but has progressively not been able  to do this because of the pain in his right hip Review of Systems  All other systems reviewed and are negative.    Objective: Vital Signs: There were no vitals taken for this visit.  Physical Exam Constitutional:      Appearance: Normal appearance.  Pulmonary:     Effort: Pulmonary effort is normal.     Breath sounds: Normal breath sounds.  Skin:    General: Skin is warm and dry.  Neurological:     General: No focal deficit present.     Mental Status: He is alert and oriented to person, place, and time.  Psychiatric:        Mood and Affect: Mood normal.        Behavior: Behavior normal.     Ortho Exam Examination of his hip no radicular findings strength intact he has fairly good motion without significant reproduction of pain.  No straight leg raise no lower back pain he is neuro logically intact Specialty Comments:  No specialty comments available.  Imaging: No results found.   PMFS History: Patient Active Problem  List   Diagnosis Date Noted   Pain in right hip 02/02/2024   Sensorineural hearing loss, bilateral 11/10/2023   Depression 05/04/2023   Pain in right shoulder 10/27/2022   Pain in right wrist 10/27/2022   Wound infection complicating hardware (HCC) 08/26/2021   Postoperative complication of skin involving drainage from surgical wound 08/26/2021   Lumbar adjacent segment disease with spondylolisthesis 08/14/2021   Insomnia 04/19/2020   Dislocated intraocular lens 05/18/2018   Dislocated intraocular lens, sequela 05/05/2018   Spondylolisthesis of lumbar region 05/05/2018   Memory difficulties 04/08/2018   Gait abnormality 04/08/2018   Alcohol withdrawal (HCC) 02/28/2018   Weakness 02/26/2018   Essential hypertension 02/26/2018   HLD (hyperlipidemia) 02/26/2018   Tremors of nervous system 02/26/2018   Alcohol abuse 02/26/2018   Weakness generalized 02/26/2018   Past Medical History:  Diagnosis Date   Alcohol abuse    Anxiety    Depression     Gait abnormality 04/08/2018   GERD (gastroesophageal reflux disease)    Hyperlipidemia    Hypertension    Kidney damage    Memory difficulties 04/08/2018    Family History  Problem Relation Age of Onset   Parkinson's disease Mother    CAD Father    Kidney disease Father    Anesthesia problems Neg Hx    Broken bones Neg Hx    Cancer Neg Hx    Clotting disorder Neg Hx    Collagen disease Neg Hx    Diabetes Neg Hx    Dislocations Neg Hx    Osteoporosis Neg Hx    Rheumatologic disease Neg Hx    Scoliosis Neg Hx    Severe sprains Neg Hx    Restless legs syndrome Neg Hx     Past Surgical History:  Procedure Laterality Date   BACK SURGERY     COLONOSCOPY     no polyps   COLONOSCOPY W/ POLYPECTOMY     EYE SURGERY     KNEE SURGERY Left 2011   fracture   LASER PHOTO ABLATION Left 05/18/2018   Procedure: LASER PHOTO ABLATION;  Surgeon: Sherrie George, MD;  Location: [facility] OR;  Service: Ophthalmology;  Laterality: Left;   LUMBAR WOUND DEBRIDEMENT N/A 08/26/2021   Procedure: LUMBAR WOUND DEBRIDEMENT;  Surgeon: Tressie Stalker, MD;  Location: [facility] OR;  Service: Neurosurgery;  Laterality: N/A;   PARS PLANA VITRECTOMY Left 05/18/2018   Procedure: PARS PLANA VITRECTOMY WITH 25G REMOVAL/SUTURE INTRAOCULAR LENS;  Surgeon: Sherrie George, MD;  Location: [facility] OR;  Service: Ophthalmology;  Laterality: Left;   VITRECTOMY Left 05/18/2018   PARS PLANA VITRECTOMY WITH 25G REMOVAL/SUTURE INTRAOCULAR LENS (Left)   X-STOP IMPLANTATION     Social History   Occupational History   Occupation: Retired  Tobacco Use   Smoking status: Former    Current packs/day: 0.00    Types: Cigarettes    Quit date: 1978    Years since quitting: 47.1   Smokeless tobacco: Never  Vaping Use   Vaping status: Never Used  Substance and Sexual Activity   Alcohol use: Yes    Alcohol/week: 20.0 standard drinks of alcohol    Types: 20 Shots of liquor per week   Drug use: No   Sexual activity: Not on file

## 2024-02-03 DIAGNOSIS — R82998 Other abnormal findings in urine: Secondary | ICD-10-CM | POA: Diagnosis not present

## 2024-02-12 ENCOUNTER — Ambulatory Visit: Payer: Medicare Other | Admitting: Surgical

## 2024-02-12 ENCOUNTER — Other Ambulatory Visit: Payer: Self-pay

## 2024-02-12 ENCOUNTER — Encounter: Payer: Self-pay | Admitting: Surgical

## 2024-02-12 DIAGNOSIS — M1611 Unilateral primary osteoarthritis, right hip: Secondary | ICD-10-CM

## 2024-02-12 DIAGNOSIS — M25551 Pain in right hip: Secondary | ICD-10-CM

## 2024-02-12 MED ORDER — BUPIVACAINE HCL 0.25 % IJ SOLN
7.0000 mL | INTRAMUSCULAR | Status: AC | PRN
  Administered 2024-02-12: 7 mL via INTRA_ARTICULAR

## 2024-02-12 MED ORDER — LIDOCAINE HCL 1 % IJ SOLN
5.0000 mL | INTRAMUSCULAR | Status: AC | PRN
  Administered 2024-02-12: 5 mL

## 2024-02-12 MED ORDER — METHYLPREDNISOLONE ACETATE 40 MG/ML IJ SUSP
40.0000 mg | INTRAMUSCULAR | Status: AC | PRN
  Administered 2024-02-12: 40 mg via INTRA_ARTICULAR

## 2024-02-12 NOTE — Progress Notes (Signed)
   Procedure Note  Patient: Brandon Fleming             Date of Birth: [birth year]           MRN: [id number]             Visit Date: 02/12/2024  Procedures: Visit Diagnoses:  1. Pain in right hip     Large Joint Inj: R hip joint on 02/12/2024 1:55 PM Indications: pain and diagnostic evaluation Details: 22 G 3.5 in needle, ultrasound-guided anterior approach Medications: 5 mL lidocaine 1 %; 7 mL bupivacaine 0.25 %; 40 mg methylPREDNISolone acetate 40 MG/ML Outcome: tolerated well, no immediate complications  Patient presents for right hip joint injection as referred by [place] persons PA-C.  He has primarily right groin pain with radiation down his leg at times.  Sometimes he has radiation down the entirety of his right leg which may be more back related with his 4 prior back surgeries.  Ultrasound injection performed and patient tolerated procedure well without complication.  Follow-up with [place] as needed if no improvement. Procedure, treatment alternatives, risks and benefits explained, specific risks discussed. Consent was given by the patient. Immediately prior to procedure a time out was called to verify the correct patient, procedure, equipment, support staff and site/side marked as required. Patient was prepped and draped in the usual sterile fashion.

## 2024-02-29 ENCOUNTER — Telehealth: Payer: Self-pay

## 2024-02-29 ENCOUNTER — Encounter: Payer: Self-pay | Admitting: Internal Medicine

## 2024-02-29 NOTE — Telephone Encounter (Signed)
 I spoke to patient's wife. Patient's wife reports she would rather keep the appointment with Dr. Renold Don tomorrow. She does not feel he needs to go to the ED at this time. She would also like a referral to a new pcp. Patient's wife advised any worsening symptoms patient will need to go to the ED

## 2024-02-29 NOTE — Telephone Encounter (Signed)
 Please review pictures.  Patient not taking any new medications, soaps or laundry detergents.  Patient having a lot of itching with the rash and using OTC benadryl cream.  I have advised her she can take him to UC to be evaluated, since she does not want to go to the pcp.  She also reported some SOB this morning. Advised her if SOB then ED. She would like for you to review the pictures first before she goes to the ED.

## 2024-02-29 NOTE — Telephone Encounter (Signed)
 Wife left vm in triage regarding patient having whelps and itching that she feels could be staph infection returning. Just informed patient has not taken his antibiotic in 3 weeks. Scheduled to come in tomorrow at 1pm just fyi she is sending pictures via mychart.

## 2024-03-01 ENCOUNTER — Other Ambulatory Visit: Payer: Self-pay

## 2024-03-01 ENCOUNTER — Ambulatory Visit (INDEPENDENT_AMBULATORY_CARE_PROVIDER_SITE_OTHER): Admitting: Internal Medicine

## 2024-03-01 ENCOUNTER — Encounter: Payer: Self-pay | Admitting: Internal Medicine

## 2024-03-01 VITALS — BP 134/84 | HR 69 | Resp 16 | Ht 66.0 in | Wt 141.2 lb

## 2024-03-01 DIAGNOSIS — L509 Urticaria, unspecified: Secondary | ICD-10-CM | POA: Diagnosis present

## 2024-03-01 DIAGNOSIS — D649 Anemia, unspecified: Secondary | ICD-10-CM | POA: Insufficient documentation

## 2024-03-01 DIAGNOSIS — R0602 Shortness of breath: Secondary | ICD-10-CM | POA: Diagnosis not present

## 2024-03-01 DIAGNOSIS — J069 Acute upper respiratory infection, unspecified: Secondary | ICD-10-CM | POA: Diagnosis not present

## 2024-03-01 DIAGNOSIS — M1610 Unilateral primary osteoarthritis, unspecified hip: Secondary | ICD-10-CM | POA: Insufficient documentation

## 2024-03-01 DIAGNOSIS — F321 Major depressive disorder, single episode, moderate: Secondary | ICD-10-CM | POA: Insufficient documentation

## 2024-03-01 DIAGNOSIS — R509 Fever, unspecified: Secondary | ICD-10-CM | POA: Diagnosis not present

## 2024-03-01 DIAGNOSIS — B974 Respiratory syncytial virus as the cause of diseases classified elsewhere: Secondary | ICD-10-CM | POA: Diagnosis not present

## 2024-03-01 MED ORDER — CETIRIZINE HCL 10 MG PO TABS: 10.0000 mg | ORAL_TABLET | Freq: Every day | ORAL | 0 refills | Status: AC

## 2024-03-01 NOTE — Progress Notes (Signed)
 [facility]  Patient Active Problem List   Diagnosis Date Noted   Anemia 03/01/2024   Arthritis of hip 03/01/2024   Moderate major depression (HCC) 03/01/2024   Pain in right hip 02/02/2024   Sensorineural hearing loss, bilateral 11/10/2023   Depression 05/04/2023   Pain in right shoulder 10/27/2022   Pain in right wrist 10/27/2022   Wound infection complicating hardware (HCC) 08/26/2021   Postoperative complication of skin involving drainage from surgical wound 08/26/2021   Lumbar adjacent segment disease with spondylolisthesis 08/14/2021   Insomnia 04/19/2020   Dislocated intraocular lens 05/18/2018   Dislocated intraocular lens, sequela 05/05/2018   Spondylolisthesis of lumbar region 05/05/2018   Memory difficulties 04/08/2018   Gait abnormality 04/08/2018   Alcohol withdrawal (HCC) 02/28/2018   Weakness 02/26/2018   Essential hypertension 02/26/2018   HLD (hyperlipidemia) 02/26/2018   Tremors of nervous system 02/26/2018   Alcohol abuse 02/26/2018   Weakness generalized 02/26/2018      Subjective:    Patient ID: Brandon Fleming, male    DOB: 01-29-1946, 78 y.o.   MRN: [id number]  Chief Complaint  Patient presents with   Rash     Cc -- f/u lumbar spine om complicated by hardware presence  HPI:  Brandon Fleming is a 78 y.o. male hx l4-5 fusion 2019 with recurrent back pain/neurogenic claudication, s/p on 08/14/21 L3-4 laminotomy/facetectomy (complex surgical procedure due to prior lumbar interbody fusion), right L3-4 interbody fusion and zimmer DBM and insertion interbody prosthesis, posterior L3-5 instrumentation, however complicated by early surgical site infection admitted 08/26/21 for I&D with retention hardware cx growing mssa   I saw paitent during admission 08/26/21.  Reviewed 9/12 operative note. Lot of pus in sq space; compromised lumbosacral fascia with pus tracking down to spine/hardware. Wound cx mssa. Hardware left  in place. No blood cx obtained He had a rash while on cefepime/vanc, but tolerated cefazolin Discharged on rifampin and cefazolin Plan 6 weeks cefazolin until 10/24 then to transition to cefadroxil for chronic suppression along with 3 months rifampin   ------ 10/04 id clinic f/u Patient had 2 weeks burning 4 lesions on his forehead; was skin color then turned erythematous. No vessicles. Also decreased appetite, generalized weakness since discharge, worse Pain same in the back; severe. Difficulty getting around. He gets around though with FWW No LE neurological deficit or urinary bowel incontinence No n/v/diarrhea No joint pain muscle ache otherwise Feels cold all the time No fever No sorethroat/cough No sick contact  [facility] labs reviewed Crp up the past week Lft up  Hct down  10/26 id f/u Shingle on forehead had resolved Still with poor appetite, malaise New legs edema -- concerned if cefazolin can do that There is occasional serosanguinous stain at incision. Pain stable mild-moderate. Patient ambulatory No fever, chill No diarrhea, n/v  Labs not available  11/18 id f/u Doing really well Wound all closed  Tolerate oral cefadroxil which was started 10/26... no further fatigue No gi sx  02/11/22 id f/u Hasn't taken abx for the last 8 days as he wasn't feeling well. Started out as nausea/epigastric discomfort, then also loose stool to diarrhea progressing to tarry black stool Has not eating much if at all and there are associated fatigue, wobbly gait. No fever, chill No sick contact The stool diarrhea frequency has decreased although he questions if it is due to decreased intake. Stool is soft and more formed Has hx of esophageal  dilatation due to stricture. This was last done early spring 2022 Hasn't taken any prednisone or nsaids leading up this   No new back pain   3/09 id f/u Reviewed labs from 2/28 -- mild aki He was seen by GI right a way and patient's  report there was lots of inflammation. Given some kind of pills to take twice a day  He said zofran helping as well with nausea Eating a little better Overall feeling better with strength as well  No more black stool  Back on antibiotics but most days can only do once a day   4/27 id f/u Patient doing very well. Not having much nausea Still occasional fall due to bourbon drinking No f/c Eating at least 1 meal a day No weight loss   06/19/22 id f/u Patient doing well, eating better Every now and then (every 5th night or so) has severe back pain that his current meds don't help much but other occasions they do help.... He is taking xtampza (oxycodone ER), prn tramadol No fever, chill He is taking memantine for his mild dementia.   Reviewed meds and he is on tylenol pm as well which I asked him to discuss with neurology if this would be in his best benefit with the dementia   01/13/23 id f/u Patient had worsening pain to the point he couldn't walk so he underwent another lumbar spine surgery/laminectomy along with revision of prior lumbar fusion. I reviewed op note by dr Lovell Sheehan in 10/2022  He initially does ok with much less pain, but then it has been getting worse again. Constant moderate pain. He is able to function though  He continues on cefadroxil 500 bid No n/v/diarrhea/rash  No f/c   04/14/23 id f/u I had wanted to see him a little earlier due to increased pain last fall and last visit He had since doing twice a week pt/ot and gone off oxycodone, then tramadol, and now on buprenorphine patch. He has been seeing pain clinic since 12/2022 Pain is better off oxycodone No f/c Very functional Appetite better since off oxycodone as well No n/v/diarrhea Taking cefadroxil 500 bid  11/24/23 id f/u Patient had started developing well demarcated swelling starting in the right thigh then spreading to his trunk. Very itchy. For 2-3 weeks before onset of rash was feeling  malaise/decreased appetite. He has stopped his prescription medication, but he was still taking the antibiotic He denies taking any new medication including otc No new topical No sick contact -- no uri sx, fever, chill, n/v/d, joint swelling/pain No chronic nightsweat, weight loss.  The malaise is still around He is using benadryl cream  The rash distribution appears to be stagnant  Current on colonoscopy/prostate cancer screening. No hx cancer. No famiy hx cancer.     Allergies  Allergen Reactions   Aricept [Donepezil Hcl] Diarrhea and Other (See Comments)    Diarrhea, stomach problems.    Atropine Swelling and Other (See Comments)    Eyes swollen and red   Other Other (See Comments)    Moisturizing eye drop - unknown      Outpatient Medications Prior to Visit  Medication Sig Dispense Refill   amLODipine-valsartan (EXFORGE) 10-160 MG tablet Take 1 tablet by mouth daily.     buprenorphine (BUTRANS) 5 MCG/HR PTWK 1 patch once a week.     cefadroxil (DURICEF) 500 MG capsule TAKE 1 CAPSULE BY MOUTH TWICE A DAY 180 capsule 3   docusate sodium (COLACE) 100 MG  capsule Take 1 capsule (100 mg total) by mouth 2 (two) times daily. 10 capsule 0   fenofibrate (TRICOR) 145 MG tablet Take 145 mg by mouth daily.     FLUoxetine (PROZAC) 20 MG capsule TAKE 1 CAPSULE BY MOUTH EVERY DAY 90 capsule 2   folic acid (FOLVITE) 1 MG tablet Take 1 mg by mouth daily.     gabapentin (NEURONTIN) 300 MG capsule Take 300 mg by mouth 3 (three) times daily.     levothyroxine (SYNTHROID) 50 MCG tablet Take 50 mcg by mouth daily before breakfast.     memantine (NAMENDA XR) 28 MG CP24 24 hr capsule Take 1 capsule (28 mg total) by mouth daily. 90 capsule 3   mirtazapine (REMERON) 30 MG tablet TAKE 1 TABLET BY MOUTH EVERYDAY AT BEDTIME 90 tablet 1   Multiple Vitamins-Minerals (MULTIVITAMIN WITH MINERALS) tablet Take 1 tablet by mouth daily.     ondansetron (ZOFRAN) 4 MG tablet 1 tablet Orally PRN     pantoprazole  (PROTONIX) 40 MG tablet Take 40 mg by mouth daily.     rosuvastatin (CRESTOR) 10 MG tablet Take 10 mg by mouth every morning.     sucralfate (CARAFATE) 1 g tablet Take 1 g by mouth 2 (two) times daily.     thiamine 100 MG tablet Take 100 mg by mouth daily.     XTAMPZA ER 18 MG C12A Take 18 mg by mouth every 12 (twelve) hours.     COVID-19 mRNA vaccine 2023-2024 (COMIRNATY) syringe Inject into the muscle. (Patient not taking: Reported on 03/01/2024) 0.3 mL 0   influenza vaccine adjuvanted (FLUAD) 0.5 ML injection Inject into the muscle. (Patient not taking: Reported on 03/01/2024) 0.5 mL 0   RSV vaccine recomb adjuvanted (AREXVY) 120 MCG/0.5ML injection Inject into the muscle. (Patient not taking: Reported on 03/01/2024) 0.5 mL 0   No facility-administered medications prior to visit.     Social History   Socioeconomic History   Marital status: Married    Spouse name: Meriam Sprague   Number of children: 1   Years of education: 16   Highest education level: Master's degree (e.g., MA, MS, MEng, MEd, MSW, MBA)  Occupational History   Occupation: Retired  Tobacco Use   Smoking status: Former    Current packs/day: 0.00    Types: Cigarettes    Quit date: 1978    Years since quitting: 47.2   Smokeless tobacco: Never  Vaping Use   Vaping status: Never Used  Substance and Sexual Activity   Alcohol use: Yes    Alcohol/week: 20.0 standard drinks of alcohol    Types: 20 Shots of liquor per week   Drug use: No   Sexual activity: Not on file  Other Topics Concern   Not on file  Social History Narrative   Lives at home with his wife.   Right-handed.   Caffeine- minimal use (4 ounces tea/soda per day)   Social Drivers of Corporate investment banker Strain: Not on file  Food Insecurity: Not on file  Transportation Needs: Not on file  Physical Activity: Not on file  Stress: Not on file  Social Connections: Not on file  Intimate Partner Violence: Not on file      Review of Systems     All other ros negative  Objective:    BP 134/84   Pulse 69   Resp 16   Ht 5\' 6"  (1.676 m)   Wt 141 lb 3.2 oz (64 kg)   BMI  22.79 kg/m  Nursing note and vital signs reviewed.  Physical Exam     General/constitutional: no distress, pleasant HEENT: Normocephalic, PER, Conj Clear, EOMI, Oropharynx clear Neck supple CV: rrr no mrg Lungs: clear to auscultation, normal respiratory effort Abd: Soft, Nontender Ext: no edema Skin: see picture; per his wife, the front of trunk lesions are improving; the back too also significantly improves -> hives are much smaller (1-2 inch lesions today) Neuro: nonfocal MSK: no peripheral joint swelling/tenderness/warmth; back spines nontender   3/17 pics (yesterday)                    Labs: Lab Results  Component Value Date   WBC 3.6 (L) 11/24/2023   HGB 11.1 (L) 11/24/2023   HCT 34.7 (L) 11/24/2023   MCV 98.6 11/24/2023   PLT 176 11/24/2023   Last metabolic panel Lab Results  Component Value Date   GLUCOSE 147 (H) 11/24/2023   NA 143 11/24/2023   K 5.2 11/24/2023   CL 112 (H) 11/24/2023   CO2 24 11/24/2023   BUN 29 (H) 11/24/2023   CREATININE 1.50 (H) 11/24/2023   EGFR 48 (L) 11/24/2023   GFRNONAA 43 (L) 09/12/2022   CALCIUM 9.7 11/24/2023   PROT 5.8 (L) 11/24/2023   ALBUMIN 3.1 (L) 09/12/2022   BILITOT 0.5 11/24/2023   ALKPHOS 64 09/12/2022   AST 31 11/24/2023   ALT 21 11/24/2023   ANIONGAP 5 09/12/2022    [facility]: 10/25 cbc 4/8.2/177; cr 0.98 10/18 cbc 6/8.6/251; cr 1.02 9/27 cbc 6/8/225 normal diff; cr 1.04 9/19 cbc 9/9/332; cr 1.02; lft 88/17/151/0.7  Crp: 10/25    16 (<10) 10/18    13 (<10) 9/29    106 (<10) 9/19      52 (<10)  Micro:  Serology:  Imaging:  Assessment & Plan:   Problem List Items Addressed This Visit   None Visit Diagnoses       Urticaria    -  Primary   Relevant Orders   CBC w/Diff   COMPLETE METABOLIC PANEL WITH GFR   Respiratory virus panel   Lactate  dehydrogenase        Abx: 10/26-c cefadroxil 500 mg bid dosingsince 04/10/2022  9/15-10/26 cefazolin 9/17-10/04 rifampin 300 mg po bid   08/26/21-15 vanc 08/26/21-15 cefepime   ASSESSMENT: Surgical site infection/vertebral om lumbar -- mssa s/p I&D 08/26/2021 Hardware complicating wound infection Rash suspect abx allergy to cefepime, less likely vanc   78 yo male hx l4-5 fusion 2019 with recurrent back pain/neurogenic claudication, s/p on 8/31 L3-4 laminotomy/facetectomy (complex surgical procedure due to prior lumbar intervody fusion), right L3-4 interbody fusion and zimmer DBM and insertion interbody prosthesis, posterior L3-5 instrumentation, however complicated most recently by early surgical site infection admitted 9/12 for I&D with retention hardware. Patient here for id clinic f/u   Reviewed 9/12 operative note. Hardware retained. Lot of pus in sq space; compromised lumbosacral fascia with pus tracking down to spine/hardware. Wound cx mssa   No sepsis this admission otherwise. No bcx obtained.   Given recent hardware placement <30 days, discharged on combination abx with rifampin  Developed rash on vanc/cefepime. Did ok on cefazolin though  ------- 10/4 id clinic f/u Fatigue/generalized weakness/poor apptite likely abx combination and ongoing pain/pain meds (taking extended oxycodone currently). Hct down and lft up considered rifampin side effect Also has a forehead lesion that is suggestive of shingle. ?cause for crp elevation Pain in lower back severe but stable without neurologic deficit. Incision intact.  Patient to discuss pain management with nsg today  At this time, will stop rifampin and continue following labs Will see back in around 3 weeks for follow up   10/26 id clinic assessment Crp down Some intermittent serosanguinous stain at the incision but no dehiscence; slight erythema around it but ?related to tape/dressing  Fatigue/poor appetite likely  multifactorial, low hemoglobin, infection, and ?as below suggested   New legs edema/ongoing fatigue query chf. No previous echo available; no hx chf. Needs chf evaluation. Cefazolin not typical to cause edema  11/18 id assessment Doing well today on oral cefadroxil. Sx from previous 2 clinic visits (fatigue/leg edema) no longer present. Not really sure what it was although temporally related to iv abx use and rifampin  Plan indefinite cefadroxil, maybe decreased dose in another 2-3 months depending on clinical status/improvement  2/28 id assessment Gi sx / tarry stool ?PUD with slow upper gib? Diarrhea slowed down since off abx -- might not be related given duration of time he has been on abx. Doesn't seem to be cdiff Discuss importance of staying on abx due to metals/back infection Also considered dehydration related acute renal injury causing more nausea  3/09 id assessment Clinically improving with upper gi sx Not taking abx as prescribed - advise him this and the ?presumed acid blocker given by GI should be his top priority   04/10/22 id assessment Tolerating 1 gram cefadroxil He is at least 6 months out and clinically doing well (went to [place] a week prior to this clinic) It would be reasonable to do 500 mg twice a day We also need lab today   06/19/22 id assessment Doing well will forgo lab today Pain -- in setting of dementia advise him to talk with neurology regarding choices of drugs given his dementia  I can trial him on topical capsaicin cream and see if helps with severe pain as needed Neuropathic pain component blocker will let him discuss with neurology first Advise him to talk about benadryl (tylenol pm) use with neurology as wel Continue cefadroxil 500 mg po bid   He has [facility] disabled parking app to be dropped off with me for me to sign   01/13/23 id assessment Continues to do well. Has another lumbar spine revision surgery for worsening LE pain. No pus or sign  of hardware loosening during operative finding Will continue on cefadroxil indefinitely  See me again in 3 months; will do labs then  He asked about physical therapy. He'll see dr Lovell Sheehan again soon. I do not see issue with core muscle strengthening, flexibility/yoga stretching, and proprioreception therapy. He can talk more about this with dr Leretha Pol   04/14/23 id assessment Doing better off oxycodone and starting physical therapy and on buprenorphine patch with pain clinic since 12/2022. Also better appetite. ?opioids associated hyperalgesia?  I am happy he is better controlled for his symptoms No sign of abx failure or relapse clinically Will see once year now -- end of this year 2024  No labs today  Continue pain clinic/pt-ot   11/24/23 id assessment Recent illness a month ago with elevated lft per patient and malaise/poor apetite/weight loss. Back to normal for a week now. Going to gym. Mild back pain and have pain patch. See pain management and dr Lovell Sheehan for nsg f/u  Taking cefadroxil 500 bid. Discuss trial off to assess for cure, but patient wants to stay on  Labs today  I suspect with recent illness crp and lft could be high  but clinically from the back standpoing I can see him in about a year  03/01/24 id clinic assessment Hives --> acute. Improving. 2-3 weeks prodrome of malaise. First onset. No hx cancer. Utd on age appropriate cancer screening. No new meds/no new otc products/no new vaccines.    He does take tylenol-pm as needed intermittently the last week   I suspect some viral infection or potentially even mycoplasma infection  Other possibility discussed but perhaps premature to think about as rash is better, include mast cell issues, malignancy (lymphoma/mm), or other autommune trigger  Will do basic labs and a throat swab for respiratory viral/bacterial infection Trial of zyrtec; continue topical benadryl. Prednisone likely not beneficial  No cough/dyspnea so  will defer chest xray for now  F/u in 4 weeks as needed -- if present still will refer to allergy. If not present then just f/u routinely for the antibiotics monitoring which is another 5-6 months    Follow-up: Return in about 4 weeks (around 03/29/2024).     Raymondo Band, MD [facility] [phone]  pager   [phone] cell 03/01/2024, 1:22 PM

## 2024-03-01 NOTE — Patient Instructions (Signed)
 Continue topical benadryl  Tylenol pm also have benadryl so avoid as this can make you more tired/sleepy - only use this before bedtime if needed but not during day  Take zyrtec once a day for the next couple weeks   Your rash seems to be improving so maybe you had a viral infection or mycoplasma walking pneunonia. Will swab your throat and check labs today   If lesion remains in 2-4 weeks will refer you to allergy   See me in 4 weeks if lesions still remains. But if it continues to go away then see me in around 4-5 months for f/u of your antibiotics monitoring

## 2024-03-02 LAB — CBC WITH DIFFERENTIAL/PLATELET
Absolute Lymphocytes: 929 {cells}/uL (ref 850–3900)
Absolute Monocytes: 378 {cells}/uL (ref 200–950)
Basophils Absolute: 22 {cells}/uL (ref 0–200)
Basophils Relative: 0.5 %
Eosinophils Absolute: 151 {cells}/uL (ref 15–500)
Eosinophils Relative: 3.5 %
HCT: 37.2 % — ABNORMAL LOW (ref 38.5–50.0)
Hemoglobin: 12.6 g/dL — ABNORMAL LOW (ref 13.2–17.1)
MCH: 32.6 pg (ref 27.0–33.0)
MCHC: 33.9 g/dL (ref 32.0–36.0)
MCV: 96.4 fL (ref 80.0–100.0)
MPV: 12.1 fL (ref 7.5–12.5)
Monocytes Relative: 8.8 %
Neutro Abs: 2821 {cells}/uL (ref 1500–7800)
Neutrophils Relative %: 65.6 %
Platelets: 149 10*3/uL (ref 140–400)
RBC: 3.86 10*6/uL — ABNORMAL LOW (ref 4.20–5.80)
RDW: 11.7 % (ref 11.0–15.0)
Total Lymphocyte: 21.6 %
WBC: 4.3 10*3/uL (ref 3.8–10.8)

## 2024-03-02 LAB — COMPLETE METABOLIC PANEL WITH GFR
AG Ratio: 1.8 (calc) (ref 1.0–2.5)
ALT: 40 U/L (ref 9–46)
AST: 62 U/L — ABNORMAL HIGH (ref 10–35)
Albumin: 4.4 g/dL (ref 3.6–5.1)
Alkaline phosphatase (APISO): 140 U/L (ref 35–144)
BUN/Creatinine Ratio: 23 (calc) — ABNORMAL HIGH (ref 6–22)
BUN: 35 mg/dL — ABNORMAL HIGH (ref 7–25)
CO2: 21 mmol/L (ref 20–32)
Calcium: 9.6 mg/dL (ref 8.6–10.3)
Chloride: 103 mmol/L (ref 98–110)
Creat: 1.51 mg/dL — ABNORMAL HIGH (ref 0.70–1.28)
Globulin: 2.5 g/dL (ref 1.9–3.7)
Glucose, Bld: 100 mg/dL — ABNORMAL HIGH (ref 65–99)
Potassium: 4.4 mmol/L (ref 3.5–5.3)
Sodium: 138 mmol/L (ref 135–146)
Total Bilirubin: 0.8 mg/dL (ref 0.2–1.2)
Total Protein: 6.9 g/dL (ref 6.1–8.1)

## 2024-03-02 LAB — LACTATE DEHYDROGENASE: LDH: 142 U/L (ref 120–250)

## 2024-03-03 LAB — RESPIRATORY VIRUS PANEL

## 2024-03-22 ENCOUNTER — Telehealth: Payer: Self-pay | Admitting: Internal Medicine

## 2024-03-22 NOTE — Telephone Encounter (Signed)
 Shelia's wife, Meriam Sprague, requested to cancel his upcoming appointment. She stated he is feeling much better and the medication Dr. Renold Don prescribed cleared the infection. Travin is currently scheduled 12/10 for an annual follow-up.

## 2024-03-22 NOTE — Telephone Encounter (Signed)
 Spoke to patient wife Brandon Fleming - she is aware for patient to stay on chronic antibiotic and follow up in December.    [name] [name], CMA

## 2024-03-24 ENCOUNTER — Other Ambulatory Visit: Payer: Self-pay | Admitting: Internal Medicine

## 2024-03-24 NOTE — Telephone Encounter (Signed)
 Per chart review, patient's rash has cleared up.   Linna Hoff, BSN, RN

## 2024-03-29 ENCOUNTER — Ambulatory Visit: Admitting: Internal Medicine

## 2024-04-13 ENCOUNTER — Other Ambulatory Visit: Payer: Self-pay | Admitting: Neurology

## 2024-05-19 ENCOUNTER — Other Ambulatory Visit: Payer: Self-pay | Admitting: Internal Medicine

## 2024-05-19 DIAGNOSIS — T847XXD Infection and inflammatory reaction due to other internal orthopedic prosthetic devices, implants and grafts, subsequent encounter: Secondary | ICD-10-CM

## 2024-05-19 NOTE — Telephone Encounter (Signed)
 Patient to remain on chronic suppressive antibiotics per last phone note 03/2024.  Ariam Mol, BSN, RN

## 2024-06-02 DIAGNOSIS — M4807 Spinal stenosis, lumbosacral region: Secondary | ICD-10-CM | POA: Diagnosis not present

## 2024-06-02 DIAGNOSIS — M48061 Spinal stenosis, lumbar region without neurogenic claudication: Secondary | ICD-10-CM | POA: Diagnosis not present

## 2024-06-02 DIAGNOSIS — M5125 Other intervertebral disc displacement, thoracolumbar region: Secondary | ICD-10-CM | POA: Diagnosis not present

## 2024-06-02 DIAGNOSIS — M5126 Other intervertebral disc displacement, lumbar region: Secondary | ICD-10-CM | POA: Diagnosis not present

## 2024-06-02 DIAGNOSIS — M4316 Spondylolisthesis, lumbar region: Secondary | ICD-10-CM | POA: Diagnosis not present

## 2024-07-15 DIAGNOSIS — M4807 Spinal stenosis, lumbosacral region: Secondary | ICD-10-CM | POA: Diagnosis not present

## 2024-08-17 DIAGNOSIS — R7989 Other specified abnormal findings of blood chemistry: Secondary | ICD-10-CM | POA: Diagnosis not present

## 2024-08-23 ENCOUNTER — Other Ambulatory Visit: Payer: Self-pay | Admitting: Gastroenterology

## 2024-08-23 DIAGNOSIS — Z860100 Personal history of colon polyps, unspecified: Secondary | ICD-10-CM | POA: Diagnosis not present

## 2024-08-23 DIAGNOSIS — R1013 Epigastric pain: Secondary | ICD-10-CM | POA: Diagnosis not present

## 2024-08-23 DIAGNOSIS — R634 Abnormal weight loss: Secondary | ICD-10-CM | POA: Diagnosis not present

## 2024-09-01 ENCOUNTER — Inpatient Hospital Stay
Admission: RE | Admit: 2024-09-01 | Discharge: 2024-09-01 | Disposition: A | Discharge status: Home / Self Care | Source: Ambulatory Visit | Attending: Gastroenterology | Admitting: Gastroenterology

## 2024-09-01 DIAGNOSIS — R634 Abnormal weight loss: Secondary | ICD-10-CM

## 2024-09-01 DIAGNOSIS — K5730 Diverticulosis of large intestine without perforation or abscess without bleeding: Secondary | ICD-10-CM | POA: Diagnosis not present

## 2024-09-01 MED ORDER — IOPAMIDOL (ISOVUE-300) INJECTION 61%
70.0000 mL | Freq: Once | INTRAVENOUS | Status: AC | PRN
  Administered 2024-09-01: 70 mL via INTRAVENOUS

## 2024-09-09 DIAGNOSIS — R051 Acute cough: Secondary | ICD-10-CM | POA: Diagnosis not present

## 2024-09-09 DIAGNOSIS — R634 Abnormal weight loss: Secondary | ICD-10-CM | POA: Diagnosis not present

## 2024-09-09 DIAGNOSIS — K5730 Diverticulosis of large intestine without perforation or abscess without bleeding: Secondary | ICD-10-CM | POA: Diagnosis not present

## 2024-09-09 DIAGNOSIS — Z91148 Patient's other noncompliance with medication regimen for other reason: Secondary | ICD-10-CM | POA: Diagnosis not present

## 2024-09-09 DIAGNOSIS — J069 Acute upper respiratory infection, unspecified: Secondary | ICD-10-CM | POA: Diagnosis not present

## 2024-09-09 DIAGNOSIS — J029 Acute pharyngitis, unspecified: Secondary | ICD-10-CM | POA: Diagnosis not present

## 2024-09-12 ENCOUNTER — Other Ambulatory Visit (HOSPITAL_BASED_OUTPATIENT_CLINIC_OR_DEPARTMENT_OTHER): Payer: Self-pay

## 2024-09-12 MED ORDER — FLUZONE HIGH-DOSE 0.5 ML IM SUSY
0.5000 mL | PREFILLED_SYRINGE | Freq: Once | INTRAMUSCULAR | 0 refills | Status: AC
  Filled 2024-09-12: qty 0.5, 1d supply, fill #0

## 2024-09-12 MED ORDER — COMIRNATY 30 MCG/0.3ML IM SUSY
0.3000 mL | PREFILLED_SYRINGE | Freq: Once | INTRAMUSCULAR | 0 refills | Status: AC
  Filled 2024-09-12: qty 0.3, 1d supply, fill #0

## 2024-09-22 DIAGNOSIS — I129 Hypertensive chronic kidney disease with stage 1 through stage 4 chronic kidney disease, or unspecified chronic kidney disease: Secondary | ICD-10-CM | POA: Diagnosis not present

## 2024-09-22 DIAGNOSIS — N1832 Chronic kidney disease, stage 3b: Secondary | ICD-10-CM | POA: Diagnosis not present

## 2024-09-22 DIAGNOSIS — R600 Localized edema: Secondary | ICD-10-CM | POA: Diagnosis not present

## 2024-09-23 DIAGNOSIS — D649 Anemia, unspecified: Secondary | ICD-10-CM | POA: Diagnosis not present

## 2024-10-04 ENCOUNTER — Encounter: Payer: Self-pay | Admitting: Physician Assistant

## 2024-10-04 ENCOUNTER — Ambulatory Visit: Admitting: Physician Assistant

## 2024-10-04 DIAGNOSIS — M1611 Unilateral primary osteoarthritis, right hip: Secondary | ICD-10-CM | POA: Insufficient documentation

## 2024-10-04 NOTE — Progress Notes (Signed)
 Office Visit Note   Patient: Brandon Fleming           Date of Birth: Dec 02, 1946           MRN: [id number] Visit Date: 10/04/2024              Requested by: Janey Santos, MD [address] [place],  [place] [address] PCP: Avva, Ravisankar, MD  Chief Complaint  Patient presents with   Right Hip - Pain      HPI: Patient is a pleasant 78 year old gentleman who comes in today with recurrent right hip and groin pain.  I saw him earlier this year.  He did undergo an ultrasound-guided injection into his hip which she both diagnostically and therapeutically said he did not help him.  He has a significant history of lumbar fusions and spine issues.  He did see his spine surgeon who did not think the issues were coming from his back.  He denies any paresthesias any loss of bowel or bladder control.  He does say that he has fallen a couple times when he turns a certain way and his hip hurts.  Assessment & Plan: Visit Diagnoses:  1. Unilateral primary osteoarthritis, right hip     Plan: Patient's health is otherwise stable.  He is a non-smoker.  He does have pain with internal/external rotation of the hip.  I do not think any this is radicular I do think this is coming from hip arthritis possibly avascular necrosis .SABRA  I discussed with he and his wife that I think at this point the only thing that may be helpful is a hip replacement.  I briefly talked about hip replacement surgery what it involved.  I would like for them to have a follow-up appointment with Dr. Vernetta can follow-up with me as needed  Follow-Up Instructions: No follow-ups on file.   Ortho Exam  Patient is alert, oriented, no adenopathy, well-dressed, normal affect, normal respiratory effort. Patient has antalgic gait ambulates with a cane.  He is neurovascular intact distally compartments are soft and nontender he does have pain with internal/external rotation of his hip.  No pain with resisted flexion of the hip no sign  of infection    Imaging: No results found. No images are attached to the encounter.  Labs: Lab Results  Component Value Date   HGBA1C 4.8 08/27/2021   ESRSEDRATE 67 (H) 08/27/2021   CRP 4.6 11/24/2023   CRP 2.6 04/10/2022   CRP 14.2 (H) 02/11/2022   REPTSTATUS 09/01/2021 FINAL 08/27/2021   REPTSTATUS 09/01/2021 FINAL 08/27/2021   GRAMSTAIN  08/26/2021    FEW SQUAMOUS EPITHELIAL CELLS PRESENT FEW WBC SEEN MODERATE GRAM POSITIVE COCCI    CULT  08/27/2021    NO GROWTH 5 DAYS Performed at [facility], [address], [place], [place] [address]    CULT  08/27/2021    NO GROWTH 5 DAYS Performed at [facility], [address], [place], [place] [address]    [facility] STAPHYLOCOCCUS AUREUS 08/26/2021     Lab Results  Component Value Date   ALBUMIN 3.1 (L) 09/12/2022   ALBUMIN 4.1 04/23/2018   ALBUMIN 3.8 03/31/2018    Lab Results  Component Value Date   MG 2.9 (H) 02/28/2018   MG 1.5 (L) 02/27/2018   No results found for: VD25OH  No results found for: PREALBUMIN    Latest Ref Rng & Units 03/01/2024    1:59 PM 11/24/2023    9:06 AM  09/12/2022    9:40 AM  CBC EXTENDED  WBC 3.8 - 10.8 Thousand/uL 4.3  3.6  4.5   RBC 4.20 - 5.80 Million/uL 3.86  3.52  3.66   Hemoglobin 13.2 - 17.1 g/dL 87.3  88.8  88.2   HCT 38.5 - 50.0 % 37.2  34.7  35.1   Platelets 140 - 400 Thousand/uL 149  176  180   NEUT# 1,500 - 7,800 cells/uL 2,821  1,721       There is no height or weight on file to calculate BMI.  Orders:  No orders of the defined types were placed in this encounter.  No orders of the defined types were placed in this encounter.    Procedures: No procedures performed  Clinical Data: No additional findings.  ROS:  All other systems negative, except as noted in the HPI. Review of Systems  Objective: Vital Signs: There were no vitals taken for this visit.  Specialty Comments:  No specialty comments available.  PMFS History: Patient  Active Problem List   Diagnosis Date Noted   Unilateral primary osteoarthritis, right hip 10/04/2024   Anemia 03/01/2024   Arthritis of hip 03/01/2024   Moderate major depression (HCC) 03/01/2024   Pain in right hip 02/02/2024   Sensorineural hearing loss, bilateral 11/10/2023   Depression 05/04/2023   Pain in right shoulder 10/27/2022   Pain in right wrist 10/27/2022   Wound infection complicating hardware 08/26/2021   Postoperative complication of skin involving drainage from surgical wound 08/26/2021   Lumbar adjacent segment disease with spondylolisthesis 08/14/2021   Insomnia 04/19/2020   Dislocated intraocular lens 05/18/2018   Dislocated intraocular lens, sequela 05/05/2018   Spondylolisthesis of lumbar region 05/05/2018   Memory difficulties 04/08/2018   Gait abnormality 04/08/2018   Alcohol  withdrawal (HCC) 02/28/2018   Weakness 02/26/2018   Essential hypertension 02/26/2018   HLD (hyperlipidemia) 02/26/2018   Tremors of nervous system 02/26/2018   Alcohol  abuse 02/26/2018   Weakness generalized 02/26/2018   Past Medical History:  Diagnosis Date   Alcohol  abuse    Anxiety    Depression    Gait abnormality 04/08/2018   GERD (gastroesophageal reflux disease)    Hyperlipidemia    Hypertension    Kidney damage    Memory difficulties 04/08/2018    Family History  Problem Relation Age of Onset   Parkinson's disease Mother    CAD Father    Kidney disease Father    Anesthesia problems Neg Hx    Broken bones Neg Hx    Cancer Neg Hx    Clotting disorder Neg Hx    Collagen disease Neg Hx    Diabetes Neg Hx    Dislocations Neg Hx    Osteoporosis Neg Hx    Rheumatologic disease Neg Hx    Scoliosis Neg Hx    Severe sprains Neg Hx    Restless legs syndrome Neg Hx     Past Surgical History:  Procedure Laterality Date   BACK SURGERY     COLONOSCOPY     no polyps   COLONOSCOPY W/ POLYPECTOMY     EYE SURGERY     KNEE SURGERY Left 2011   fracture   LASER PHOTO  ABLATION Left 05/18/2018   Procedure: LASER PHOTO ABLATION;  Surgeon: Alvia Norleen BIRCH, MD;  Location: [facility] OR;  Service: Ophthalmology;  Laterality: Left;   LUMBAR WOUND DEBRIDEMENT N/A 08/26/2021   Procedure: LUMBAR WOUND DEBRIDEMENT;  Surgeon: Mavis Purchase, MD;  Location: [facility] OR;  Service: Neurosurgery;  Laterality: N/A;   PARS PLANA VITRECTOMY Left 05/18/2018   Procedure: PARS PLANA VITRECTOMY WITH 25G REMOVAL/SUTURE INTRAOCULAR LENS;  Surgeon: Alvia Norleen BIRCH, MD;  Location: [facility] OR;  Service: Ophthalmology;  Laterality: Left;   VITRECTOMY Left 05/18/2018   PARS PLANA VITRECTOMY WITH 25G REMOVAL/SUTURE INTRAOCULAR LENS (Left)   X-STOP IMPLANTATION     Social History   Occupational History   Occupation: Retired  Tobacco Use   Smoking status: Former    Current packs/day: 0.00    Types: Cigarettes    Quit date: 1978    Years since quitting: 47.8   Smokeless tobacco: Never  Vaping Use   Vaping status: Never Used  Substance and Sexual Activity   Alcohol  use: Yes    Alcohol /week: 20.0 standard drinks of alcohol     Types: 20 Shots of liquor per week   Drug use: No   Sexual activity: Not on file

## 2024-10-10 DIAGNOSIS — D649 Anemia, unspecified: Secondary | ICD-10-CM | POA: Diagnosis not present

## 2024-10-10 DIAGNOSIS — R634 Abnormal weight loss: Secondary | ICD-10-CM | POA: Diagnosis not present

## 2024-10-17 ENCOUNTER — Encounter: Payer: Self-pay | Admitting: Radiology

## 2024-10-22 ENCOUNTER — Other Ambulatory Visit: Payer: Self-pay

## 2024-10-22 ENCOUNTER — Emergency Department (HOSPITAL_BASED_OUTPATIENT_CLINIC_OR_DEPARTMENT_OTHER)
Admission: EM | Admit: 2024-10-22 | Discharge: 2024-10-22 | Disposition: A | Discharge status: Home / Self Care | Attending: Emergency Medicine | Admitting: Emergency Medicine

## 2024-10-22 ENCOUNTER — Emergency Department (HOSPITAL_BASED_OUTPATIENT_CLINIC_OR_DEPARTMENT_OTHER)

## 2024-10-22 DIAGNOSIS — S0990XA Unspecified injury of head, initial encounter: Secondary | ICD-10-CM | POA: Diagnosis present

## 2024-10-22 DIAGNOSIS — Z87891 Personal history of nicotine dependence: Secondary | ICD-10-CM | POA: Diagnosis not present

## 2024-10-22 DIAGNOSIS — S60221A Contusion of right hand, initial encounter: Secondary | ICD-10-CM | POA: Diagnosis not present

## 2024-10-22 DIAGNOSIS — Z79899 Other long term (current) drug therapy: Secondary | ICD-10-CM | POA: Insufficient documentation

## 2024-10-22 DIAGNOSIS — W109XXA Fall (on) (from) unspecified stairs and steps, initial encounter: Secondary | ICD-10-CM | POA: Insufficient documentation

## 2024-10-22 DIAGNOSIS — I6782 Cerebral ischemia: Secondary | ICD-10-CM | POA: Diagnosis not present

## 2024-10-22 DIAGNOSIS — M19049 Primary osteoarthritis, unspecified hand: Secondary | ICD-10-CM | POA: Diagnosis not present

## 2024-10-22 DIAGNOSIS — I10 Essential (primary) hypertension: Secondary | ICD-10-CM | POA: Diagnosis not present

## 2024-10-22 DIAGNOSIS — S0101XA Laceration without foreign body of scalp, initial encounter: Secondary | ICD-10-CM | POA: Diagnosis not present

## 2024-10-22 DIAGNOSIS — M79643 Pain in unspecified hand: Secondary | ICD-10-CM | POA: Diagnosis not present

## 2024-10-22 DIAGNOSIS — R220 Localized swelling, mass and lump, head: Secondary | ICD-10-CM | POA: Diagnosis not present

## 2024-10-22 DIAGNOSIS — W108XXA Fall (on) (from) other stairs and steps, initial encounter: Secondary | ICD-10-CM

## 2024-10-22 NOTE — ED Triage Notes (Signed)
 Patient states mechanical fall around 9pm last night after missing a step. Hit his head and landed on right hand. Lac to forehead. Bruising and swelling to right palm near thumb.

## 2024-10-22 NOTE — ED Notes (Signed)
 Side of head cleaned, two spots on front of head cleaned also.

## 2024-10-22 NOTE — Discharge Instructions (Signed)
 It was a pleasure caring for you today in the emergency department.  Based on the events which brought you to the ER today, it is possible that you may have a concussion. A concussion occurs when there is a blow to the head or body, with enough force to shake the brain and disrupt how the brain functions. You may experience symptoms such as headaches, sensitivity to light/noise, dizziness, cognitive slowing, difficulty concentrating / remembering, trouble sleeping and drowsiness. These symptoms may last anywhere from hours/days to potentially weeks/months. While these symptoms are very frustrating and perhaps debilitating, it is important that you remember that they will improve over time. Everyone has a different rate of recovery; it is difficult to predict when your symptoms will resolve. In order to allow for your brain to heal after the injury, we recommend that you see your primary physician or a physician knowledgeable in concussion management. We also advise you to let your body and brain rest: avoid physical activities (sports, gym, and exercise) and reduce cognitive demands (reading, texting, TV watching, computer use, video games, etc). School attendance, after-school activities and work may need to be modified to avoid increasing symptoms. We recommend against driving until until all symptoms have resolved. Come back to the ER right away if you are having repeated episodes of vomiting, severe/worsening headache/dizziness or any other symptom that alarms you. We recommended that someone stay with you for the next 24 hours to monitor for these worrisome symptoms.  You can apply topical antibiotic ointment to the abrasions on your scalp. The tissue adhesive will fall off on its own over the next 2 weeks.  Please return to the emergency department for any worsening or worrisome symptoms.

## 2024-10-22 NOTE — ED Provider Notes (Signed)
 [facility] AT [facility] Provider Note  CSN: [id number] Arrival date & time: 10/22/24 1043  Chief Complaint(s) Fall  HPI Brandon Fleming is a 78 y.o. male with past medical history as below, significant for GERD, HLD, HTN, MDD, alcohol  abuse who presents to the ED with complaint of fall  Patient was at a sporting event last night with family, he tripped while walking on the stairs.  Fell down 2-3 steps.  Hit his head on the ground, injured his right hand.  No LOC, no thinners.  He was helped back to his seat by staff.  He has been ambulatory since then.  No significant headaches, no nausea or vomiting, no behavior changes.  Denies syncope.  Denies use of blood thinners.  Tetanus up-to-date per spouse.  No significant neck pain.  No significant behavior changes.  Normal state of health prior to apparent mechanical fall.  Past Medical History Past Medical History:  Diagnosis Date   Alcohol  abuse    Anxiety    Depression    Gait abnormality 04/08/2018   GERD (gastroesophageal reflux disease)    Hyperlipidemia    Hypertension    Kidney damage    Memory difficulties 04/08/2018   Patient Active Problem List   Diagnosis Date Noted   Unilateral primary osteoarthritis, right hip 10/04/2024   Anemia 03/01/2024   Arthritis of hip 03/01/2024   Moderate major depression (HCC) 03/01/2024   Pain in right hip 02/02/2024   Sensorineural hearing loss, bilateral 11/10/2023   Depression 05/04/2023   Pain in right shoulder 10/27/2022   Pain in right wrist 10/27/2022   Wound infection complicating hardware 08/26/2021   Postoperative complication of skin involving drainage from surgical wound 08/26/2021   Lumbar adjacent segment disease with spondylolisthesis 08/14/2021   Insomnia 04/19/2020   Dislocated intraocular lens 05/18/2018   Dislocated intraocular lens, sequela 05/05/2018   Spondylolisthesis of lumbar region 05/05/2018   Memory difficulties 04/08/2018    Gait abnormality 04/08/2018   Alcohol  withdrawal (HCC) 02/28/2018   Weakness 02/26/2018   Essential hypertension 02/26/2018   HLD (hyperlipidemia) 02/26/2018   Tremors of nervous system 02/26/2018   Alcohol  abuse 02/26/2018   Weakness generalized 02/26/2018   Home Medication(s) Prior to Admission medications   Medication Sig Start Date End Date Taking? Authorizing Provider  amLODipine -valsartan  (EXFORGE ) 10-160 MG tablet Take 1 tablet by mouth daily. 08/28/20   [provider]  buprenorphine (BUTRANS) 5 MCG/HR PTWK 1 patch once a week. 04/06/23   [provider]  cefadroxil  (DURICEF) 500 MG capsule TAKE 1 CAPSULE BY MOUTH TWICE A DAY 05/19/24   Vu, Trung T, MD  cetirizine  (ZYRTEC  ALLERGY) 10 MG tablet Take 1 tablet (10 mg total) by mouth daily. 03/01/24   Vu, Constance T, MD  COVID-19 mRNA vaccine [phone] (COMIRNATY ) syringe Inject into the muscle. Patient not taking: Reported on 03/01/2024 10/01/22     docusate sodium  (COLACE) 100 MG capsule Take 1 capsule (100 mg total) by mouth 2 (two) times daily. 08/15/21   Bergman, Meghan D, NP  fenofibrate  (TRICOR ) 145 MG tablet Take 145 mg by mouth daily.    [provider]  FLUoxetine  (PROZAC ) 20 MG capsule TAKE 1 CAPSULE BY MOUTH EVERY DAY 12/14/23   Gayland Lauraine PARAS, NP  folic acid  (FOLVITE ) 1 MG tablet Take 1 mg by mouth daily.    [provider]  gabapentin  (NEURONTIN ) 300 MG capsule Take 300 mg by mouth 3 (three) times daily. 09/06/21   [provider]  influenza vaccine adjuvanted (FLUAD ) 0.5 ML injection Inject into the muscle. Patient not taking: Reported on 03/01/2024 10/01/22     levothyroxine  (SYNTHROID ) 50 MCG tablet Take 50 mcg by mouth daily before breakfast. 04/09/20   [provider]  memantine  (NAMENDA  XR) 28 MG CP24 24 hr capsule TAKE 1 CAPSULE BY MOUTH DAILY. 04/13/24   Gayland Lauraine PARAS, NP  mirtazapine  (REMERON ) 30 MG tablet TAKE 1 TABLET BY MOUTH EVERYDAY AT BEDTIME 12/14/23   Gayland Lauraine PARAS, NP  Multiple Vitamins-Minerals (MULTIVITAMIN WITH MINERALS) tablet Take 1 tablet by mouth daily.    [provider]  ondansetron  (ZOFRAN ) 4 MG tablet 1 tablet Orally PRN    [provider]  pantoprazole  (PROTONIX ) 40 MG tablet Take 40 mg by mouth daily.    [provider]  rosuvastatin  (CRESTOR ) 10 MG tablet Take 10 mg by mouth every morning. 12/28/13   [provider]  RSV vaccine recomb adjuvanted (AREXVY ) 120 MCG/0.5ML injection Inject into the muscle. Patient not taking: Reported on 03/01/2024 10/27/22     sucralfate  (CARAFATE ) 1 g tablet Take 1 g by mouth 2 (two) times daily. 04/07/22   [provider]  thiamine  100 MG tablet Take 100 mg by mouth daily.    [provider]  XTAMPZA  ER 18 MG C12A Take 18 mg by mouth every 12 (twelve) hours. 09/12/21   [provider]                                                                                                                                    Past Surgical History Past Surgical History:  Procedure Laterality Date   BACK SURGERY     COLONOSCOPY     no polyps   COLONOSCOPY W/ POLYPECTOMY     EYE SURGERY     KNEE SURGERY Left 2011   fracture   LASER PHOTO ABLATION Left 05/18/2018   Procedure: LASER PHOTO ABLATION;  Surgeon: Alvia Norleen BIRCH, MD;  Location: [facility] OR;  Service: Ophthalmology;  Laterality: Left;   LUMBAR WOUND DEBRIDEMENT N/A 08/26/2021   Procedure: LUMBAR WOUND DEBRIDEMENT;  Surgeon: Mavis Purchase, MD;  Location: [facility] OR;  Service: Neurosurgery;  Laterality: N/A;   PARS PLANA VITRECTOMY Left 05/18/2018   Procedure: PARS PLANA VITRECTOMY WITH 25G REMOVAL/SUTURE INTRAOCULAR LENS;  Surgeon: Alvia Norleen BIRCH, MD;  Location: [facility] OR;  Service: Ophthalmology;  Laterality: Left;   VITRECTOMY Left 05/18/2018   PARS PLANA VITRECTOMY WITH 25G REMOVAL/SUTURE INTRAOCULAR LENS (Left)   X-STOP IMPLANTATION     Family History Family History  Problem Relation Age of Onset    Parkinson's disease Mother    CAD Father    Kidney disease Father    Anesthesia problems Neg Hx    Broken bones Neg Hx    Cancer Neg Hx    Clotting disorder Neg Hx    Collagen disease Neg Hx    Diabetes Neg Hx  Dislocations Neg Hx    Osteoporosis Neg Hx    Rheumatologic disease Neg Hx    Scoliosis Neg Hx    Severe sprains Neg Hx    Restless legs syndrome Neg Hx     Social History Social History   Tobacco Use   Smoking status: Former    Current packs/day: 0.00    Types: Cigarettes    Quit date: 1978    Years since quitting: 47.8   Smokeless tobacco: Never  Vaping Use   Vaping status: Never Used  Substance Use Topics   Alcohol  use: Yes    Alcohol /week: 20.0 standard drinks of alcohol     Types: 20 Shots of liquor per week   Drug use: No   Allergies Aricept  [donepezil  hcl], Atropine , and Other  Review of Systems A thorough review of systems was obtained and all systems are negative except as noted in the HPI and PMH.   Physical Exam Vital Signs  I have reviewed the triage vital signs BP (!) 159/81   Pulse 75   Temp 98.7 F (37.1 C) (Oral)   Resp 15   SpO2 97%  Physical Exam Vitals and nursing note reviewed.  Constitutional:      General: He is not in acute distress.    Appearance: Normal appearance. He is well-developed. He is not ill-appearing.  HENT:     Head: Normocephalic. No raccoon eyes, Battle's sign, right periorbital erythema or left periorbital erythema.     Comments: Abrasions to forehead, superficial laceration noted as well    Right Ear: External ear normal.     Left Ear: External ear normal.     Nose: Nose normal.     Mouth/Throat:     Mouth: Mucous membranes are moist.  Eyes:     General: Vision grossly intact. Gaze aligned appropriately. No scleral icterus.       Right eye: No discharge.        Left eye: No discharge.     Extraocular Movements:     Right eye: Normal extraocular motion.     Left eye: Normal extraocular motion.   Cardiovascular:     Rate and Rhythm: Normal rate.  Pulmonary:     Effort: Pulmonary effort is normal. No respiratory distress.     Breath sounds: No stridor.  Abdominal:     General: Abdomen is flat. There is no distension.     Tenderness: There is no guarding.  Musculoskeletal:        General: No deformity.       Hands:     Cervical back: Full passive range of motion without pain. No rigidity. No pain with movement or spinous process tenderness.     Comments: Right upper extremity is NVI No wrist pain right, no snuff box ttp right  Skin:    General: Skin is warm and dry.     Coloration: Skin is not cyanotic, jaundiced or pale.  Neurological:     Mental Status: He is alert and oriented to person, place, and time.     GCS: GCS eye subscore is 4. GCS verbal subscore is 5. GCS motor subscore is 6.     Cranial Nerves: No dysarthria or facial asymmetry.     Sensory: No sensory deficit.     Motor: No weakness.     Coordination: Coordination normal.     Gait: Gait is intact.  Psychiatric:        Speech: Speech normal.  Behavior: Behavior normal. Behavior is cooperative.     ED Results and Treatments Labs (all labs ordered are listed, but only abnormal results are displayed) Labs Reviewed - No data to display                                                                                                                        Radiology CT Head Wo Contrast Result Date: 10/22/2024 EXAM: CT HEAD WITHOUT CONTRAST 10/22/2024 12:41:53 PM TECHNIQUE: CT of the head was performed without the administration of intravenous contrast. Automated exposure control, iterative reconstruction, and/or weight based adjustment of the mA/kV was utilized to reduce the radiation dose to as low as reasonably achievable. COMPARISON: CT head 04/21/2018. CLINICAL HISTORY: Head trauma, minor (Age >= 65y). FINDINGS: BRAIN AND VENTRICLES: No acute hemorrhage. No evidence of acute infarct. No hydrocephalus. No  extra-axial collection. No mass effect or midline shift. Mild chronic microvascular ischemic changes. Atherosclerosis of the carotid siphons. ORBITS: Bilateral lens replacement. SINUSES: Scattered mucosal thickening in the ethmoid and maxillary sinuses. SOFT TISSUES AND SKULL: Mild soft tissue swelling in the frontal scalp slightly greater right of midline with possible overlying laceration. No skull fracture. IMPRESSION: 1. No acute intracranial abnormality. 2. Mild soft tissue swelling in the frontal scalp slightly greater right of midline with possible overlying laceration. 3. Mild chronic microvascular ischemic changes. Electronically signed by: Donnice Mania MD 10/22/2024 12:48 PM EST RP Workstation: [id number]   DG Hand Complete Right Result Date: 10/22/2024 EXAM: 3 OR MORE VIEW(S) XRAY OF THE HAND 10/22/2024 12:12:00 PM COMPARISON: 10/27/2022. CLINICAL HISTORY: fall, pain at base of thumb FINDINGS: BONES AND JOINTS: No acute fracture. No focal osseous lesion. Chronic volar subluxation of the second through fourth MCP joints. Severe osteoarthritis at the first [facility] SOFT TISSUES: Soft tissue swelling of the hand. IMPRESSION: 1. Chronic subluxation of the second through fourth MCP joints. 2. Soft tissue swelling of the hand. 3. Severe osteoarthritis at the first [facility]. Electronically signed by: Waddell Calk MD 10/22/2024 12:34 PM EST RP Workstation: [id number]    Pertinent labs & imaging results that were available during my care of the patient were reviewed by me and considered in my medical decision making (see MDM for details).  Medications Ordered in ED Medications - No data to display  Procedures .Laceration Repair  Date/Time: 10/22/2024 2:51 PM  Performed by: Elnor Jayson LABOR, DO Authorized by: Elnor Jayson LABOR, DO   Consent:    Consent obtained:  Verbal   Consent  given by:  Patient   Risks, benefits, and alternatives were discussed: yes     Risks discussed:  Infection, pain and need for additional repair   Alternatives discussed:  No treatment and delayed treatment Universal protocol:    Procedure explained and questions answered to patient or proxy's satisfaction: yes     Immediately prior to procedure, a time out was called: yes     Patient identity confirmed:  Verbally with patient and arm band Anesthesia:    Anesthesia method:  None Laceration details:    Location:  Scalp   Scalp location:  Frontal   Length (cm):  3   Depth (mm):  2 Pre-procedure details:    Preparation:  Patient was prepped and draped in usual sterile fashion and imaging obtained to evaluate for foreign bodies Exploration:    Limited defect created (wound extended): no     Hemostasis achieved with:  Direct pressure   Imaging obtained comment:  Ct   Imaging outcome: foreign body not noted     Wound exploration: wound explored through full range of motion and entire depth of wound visualized   Treatment:    Area cleansed with:  Shur-Clens   Amount of cleaning:  Standard   Debridement:  None   Undermining:  None Skin repair:    Repair method:  Tissue adhesive Approximation:    Approximation:  Close Repair type:    Repair type:  Simple Post-procedure details:    Dressing:  Open (no dressing)   Procedure completion:  Tolerated well, no immediate complications   (including critical care time)  Medical Decision Making / ED Course    Medical Decision Making:    CHOZEN LATULIPPE is a 78 y.o. male with past medical history as below, significant for GERD, HLD, HTN, MDD, alcohol  abuse who presents to the ED with complaint of fall. The complaint involves an extensive differential diagnosis and also carries with it a high risk of complications and morbidity.  Serious etiology was considered. Ddx includes but is not limited to: Differential diagnoses for head trauma  includes subdural hematoma, epidural hematoma, acute concussion, traumatic subarachnoid hemorrhage, cerebral contusions, fracture, dislocation etc.   Complete initial physical exam performed, notably the patient was in no acute distress.    Reviewed and confirmed nursing documentation for past medical history, family history, social history.  Vital signs reviewed.    Mechanical fall Forehead laceration  Hand contusion > - forehead laceration on exam, tetanus UTD, wound repaired with tissue adhesive, tol well, wound care instructions for home - CTH was w/o abnormality, provided concussion precautions, he is neuro intact, acting at baseline per family at bedside - right hand w/o fx, he is NVI, he has contusion, no snuff box ttp. NVI. Supportive care at home encouraged  2:51 PM:  I have discussed the diagnosis/risks/treatment options with the patient and family.  Evaluation and diagnostic testing in the emergency department does not suggest an emergent condition requiring admission or immediate intervention beyond what has been performed at this time.  They will follow up with pcp. We also discussed returning to the ED immediately if new or worsening sx occur. We discussed the sx which are most concerning (e.g., sudden worsening pain, fever, inability to tolerate by mouth, severe ha, behavior change) that necessitate  immediate return.    The patient appears reasonably screened and/or stabilized for discharge and I doubt any other medical condition or other [facility] requiring further screening, evaluation, or treatment in the ED at this time prior to discharge.                       Additional history obtained: -Additional history obtained from family -External records from outside source obtained and reviewed including: Chart review including previous notes, labs, imaging, consultation notes including  Allergy list   Lab Tests: na  EKG   EKG Interpretation Date/Time:     Ventricular Rate:    PR Interval:    QRS Duration:    QT Interval:    QTC Calculation:   R Axis:      Text Interpretation:           Imaging Studies ordered: I ordered imaging studies including cth, hand XR right I independently visualized the following imaging with scope of interpretation limited to determining acute life threatening conditions related to emergency care; findings noted above I agree with the radiologist interpretation If any imaging was obtained with contrast I closely monitored patient for any possible adverse reaction a/w contrast administration in the emergency department   Medicines ordered and prescription drug management: No orders of the defined types were placed in this encounter.   -I have reviewed the patients home medicines and have made adjustments as needed   Consultations Obtained: na   Cardiac Monitoring: Continuous pulse oximetry interpreted by myself, 100% on RA.    Social Determinants of Health:  Diagnosis or treatment significantly limited by social determinants of health: former smoker and alcohol  use   Reevaluation: After the interventions noted above, I reevaluated the patient and found that they have improved  Co morbidities that complicate the patient evaluation  Past Medical History:  Diagnosis Date   Alcohol  abuse    Anxiety    Depression    Gait abnormality 04/08/2018   GERD (gastroesophageal reflux disease)    Hyperlipidemia    Hypertension    Kidney damage    Memory difficulties 04/08/2018      Dispostion: Disposition decision including need for hospitalization was considered, and patient discharged from emergency department.    Final Clinical Impression(s) / ED Diagnoses Final diagnoses:  Fall down stairs, initial encounter  Laceration of scalp, initial encounter  Contusion of right hand, initial encounter        Elnor Jayson LABOR, DO 10/22/24 1451

## 2024-10-22 NOTE — ED Notes (Signed)
Derma bond in room

## 2024-11-02 ENCOUNTER — Encounter: Admitting: Orthopaedic Surgery

## 2024-11-02 ENCOUNTER — Ambulatory Visit: Admitting: Orthopaedic Surgery

## 2024-11-02 DIAGNOSIS — M1611 Unilateral primary osteoarthritis, right hip: Secondary | ICD-10-CM | POA: Diagnosis not present

## 2024-11-02 DIAGNOSIS — M25551 Pain in right hip: Secondary | ICD-10-CM | POA: Diagnosis not present

## 2024-11-02 NOTE — Progress Notes (Signed)
 The patient is a 78 year old gentleman who has been seen at [facility] before for right hip pain and known well-documented osteoarthritis of the right hip.  He and his wife are friends with Dr. Anderson and his wife.  He has been having pain for well over a year now and getting slowly worse with time.  He has had extensive lumbar spine surgery at multiple levels in the past.  At this point his right hip pain is daily and is detriment affecting his mobility, his quality of life and his actives daily living.  He has had a steroid injection in the right hip joint under radiographic guidance and this was not helpful at all.  He is also a patient of Dr. Janey of [facility].  He is not on blood thinning medication.  He is a thin individual and is not a diabetic.  He does not have any cardiac issues but he has been having some GI issues and is actually seeing Dr. Burnette and does have a GI procedure coming up to believe in December.  On exam his right hip has significant pain with internal and external rotation with pain in the groin.  The left hip moves smoothly and fluidly.  An AP pelvis and lateral the right hip from earlier this year shows end-stage bone-on-bone arthritis of the right hip.  The left hip joint space is well-maintained.  This point he is interested in having hip replacement surgery.  He and his wife would like to wait until later in January and I agree with this as well due to the fact that he can find out what is going well from a GI standpoint and we can then go from there in terms of proceeding with surgery if everything is stable and fine from his GI workup.  All question concerns were answered addressed.  Will be in touch with scheduling for a right total hip arthroplasty.  We did talk about hip replacement surgery and general and I gave him a handout about hip replacement surgery.  We went over his x-rays and I discussed the risks and benefits of the surgery and  what to expect from an intraoperative and postoperative standpoint.

## 2024-11-14 DIAGNOSIS — M4807 Spinal stenosis, lumbosacral region: Secondary | ICD-10-CM | POA: Diagnosis not present

## 2024-11-14 DIAGNOSIS — M4316 Spondylolisthesis, lumbar region: Secondary | ICD-10-CM | POA: Diagnosis not present

## 2024-11-18 ENCOUNTER — Telehealth: Payer: Self-pay | Admitting: Radiology

## 2024-11-18 NOTE — Telephone Encounter (Signed)
 Patient's wife is calling to schedule his hip surgery. She states that it has to be scheduled around her cancer treatment for pancreatic cancer,  and she just received her treatment plan today. She asks that you return call at [phone].

## 2024-11-21 NOTE — Telephone Encounter (Signed)
 I called and scheduled with wife for 01/10/25.

## 2024-11-23 ENCOUNTER — Other Ambulatory Visit: Payer: Self-pay

## 2024-11-23 ENCOUNTER — Ambulatory Visit: Payer: Medicare Other | Admitting: Internal Medicine

## 2024-11-23 VITALS — BP 155/83 | HR 75 | Temp 98.0°F | Wt 184.0 lb

## 2024-11-23 DIAGNOSIS — M4626 Osteomyelitis of vertebra, lumbar region: Secondary | ICD-10-CM | POA: Diagnosis not present

## 2024-11-23 DIAGNOSIS — F1010 Alcohol abuse, uncomplicated: Secondary | ICD-10-CM

## 2024-11-23 DIAGNOSIS — M8668 Other chronic osteomyelitis, other site: Secondary | ICD-10-CM

## 2024-11-23 DIAGNOSIS — T847XXD Infection and inflammatory reaction due to other internal orthopedic prosthetic devices, implants and grafts, subsequent encounter: Secondary | ICD-10-CM | POA: Diagnosis present

## 2024-11-23 DIAGNOSIS — B9689 Other specified bacterial agents as the cause of diseases classified elsewhere: Secondary | ICD-10-CM

## 2024-11-23 NOTE — Progress Notes (Signed)
 [facility]  Patient Active Problem List   Diagnosis Date Noted   Unilateral primary osteoarthritis, right hip 10/04/2024   Anemia 03/01/2024   Arthritis of hip 03/01/2024   Moderate major depression (HCC) 03/01/2024   Pain in right hip 02/02/2024   Sensorineural hearing loss, bilateral 11/10/2023   Depression 05/04/2023   Pain in right shoulder 10/27/2022   Pain in right wrist 10/27/2022   Wound infection complicating hardware 08/26/2021   Postoperative complication of skin involving drainage from surgical wound 08/26/2021   Lumbar adjacent segment disease with spondylolisthesis 08/14/2021   Insomnia 04/19/2020   Dislocated intraocular lens 05/18/2018   Dislocated intraocular lens, sequela 05/05/2018   Spondylolisthesis of lumbar region 05/05/2018   Memory difficulties 04/08/2018   Gait abnormality 04/08/2018   Alcohol  withdrawal (HCC) 02/28/2018   Weakness 02/26/2018   Essential hypertension 02/26/2018   HLD (hyperlipidemia) 02/26/2018   Tremors of nervous system 02/26/2018   Alcohol  abuse 02/26/2018   Weakness generalized 02/26/2018      Subjective:    Patient ID: Brandon Fleming, male    DOB: 11-16-1946, 78 y.o.   MRN: [id number]  Chief Complaint  Patient presents with   Follow-up    Off cefadroxil .      Cc -- f/u lumbar spine om complicated by hardware presence  HPI:  Brandon Fleming is a 78 y.o. male hx l4-5 fusion 2019 with recurrent back pain/neurogenic claudication, s/p on 08/14/21 L3-4 laminotomy/facetectomy (complex surgical procedure due to prior lumbar interbody fusion), right L3-4 interbody fusion and zimmer DBM and insertion interbody prosthesis, posterior L3-5 instrumentation, however complicated by early surgical site infection admitted 08/26/21 for I&D with retention hardware cx growing mssa  11/23/24 id clinic f/u He has been on suppressive abx after initial tx since 08/2021, however had stopped all medication  including abx just after thanksgiving 2025 He said he is taking too many meds He drinks alcohol  2 drinks every day He has been fallings for a while and has right hip djd which is causing his leg to give He had seen ortho and has planned arthroplasty 01/10/2025  He also complains of right upper quadrant mild-moderate pain many days of the week no exacerbating factors. Recent ed visit early 10/2024 normal cmp He doesn't have hx biliary stone No fever, chill  He is on pain meds and hasn't noticed any worsening back pain since stopping cefadroxil   He has no other complaint today   Allergies  Allergen Reactions   Aricept  [Donepezil  Hcl] Diarrhea and Other (See Comments)    Diarrhea, stomach problems.    Atropine  Swelling and Other (See Comments)    Eyes swollen and red   Other Other (See Comments)    Moisturizing eye drop - unknown      Outpatient Medications Prior to Visit  Medication Sig Dispense Refill   amLODipine -valsartan  (EXFORGE ) 10-160 MG tablet Take 1 tablet by mouth daily.     buprenorphine (BUTRANS) 5 MCG/HR PTWK 1 patch once a week.     cefadroxil  (DURICEF) 500 MG capsule TAKE 1 CAPSULE BY MOUTH TWICE A DAY 180 capsule 1   cetirizine  (ZYRTEC  ALLERGY) 10 MG tablet Take 1 tablet (10 mg total) by mouth daily. 30 tablet 0   COVID-19 mRNA vaccine 2023-2024 (COMIRNATY ) syringe Inject into the muscle. 0.3 mL 0   docusate sodium  (COLACE) 100 MG capsule Take 1 capsule (100 mg total) by mouth 2 (two) times daily. 10 capsule 0  fenofibrate  (TRICOR ) 145 MG tablet Take 145 mg by mouth daily.     FLUoxetine  (PROZAC ) 20 MG capsule TAKE 1 CAPSULE BY MOUTH EVERY DAY 90 capsule 2   folic acid  (FOLVITE ) 1 MG tablet Take 1 mg by mouth daily.     gabapentin  (NEURONTIN ) 300 MG capsule Take 300 mg by mouth 3 (three) times daily.     influenza vaccine adjuvanted (FLUAD ) 0.5 ML injection Inject into the muscle. 0.5 mL 0   levothyroxine  (SYNTHROID ) 50 MCG tablet Take 50 mcg by mouth daily  before breakfast.     memantine  (NAMENDA  XR) 28 MG CP24 24 hr capsule TAKE 1 CAPSULE BY MOUTH DAILY. 90 capsule 3   mirtazapine  (REMERON ) 30 MG tablet TAKE 1 TABLET BY MOUTH EVERYDAY AT BEDTIME 90 tablet 1   Multiple Vitamins-Minerals (MULTIVITAMIN WITH MINERALS) tablet Take 1 tablet by mouth daily.     ondansetron  (ZOFRAN ) 4 MG tablet 1 tablet Orally PRN     pantoprazole  (PROTONIX ) 40 MG tablet Take 40 mg by mouth daily.     rosuvastatin  (CRESTOR ) 10 MG tablet Take 10 mg by mouth every morning.     RSV vaccine recomb adjuvanted (AREXVY ) 120 MCG/0.5ML injection Inject into the muscle. 0.5 mL 0   sucralfate  (CARAFATE ) 1 g tablet Take 1 g by mouth 2 (two) times daily.     thiamine  100 MG tablet Take 100 mg by mouth daily.     XTAMPZA  ER 18 MG C12A Take 18 mg by mouth every 12 (twelve) hours.     No facility-administered medications prior to visit.     Social History   Socioeconomic History   Marital status: Married    Spouse name: Rojelio   Number of children: 1   Years of education: 16   Highest education level: Master's degree (e.g., MA, MS, MEng, MEd, MSW, MBA)  Occupational History   Occupation: Retired  Tobacco Use   Smoking status: Former    Current packs/day: 0.00    Types: Cigarettes    Quit date: 1978    Years since quitting: 47.9   Smokeless tobacco: Never  Vaping Use   Vaping status: Never Used  Substance and Sexual Activity   Alcohol  use: Yes    Alcohol /week: 20.0 standard drinks of alcohol     Types: 20 Shots of liquor per week   Drug use: No   Sexual activity: Not on file  Other Topics Concern   Not on file  Social History Narrative   Lives at home with his wife.   Right-handed.   Caffeine- minimal use (4 ounces tea/soda per day)   Social Drivers of Corporate Investment Banker Strain: Not on file  Food Insecurity: Not on file  Transportation Needs: Not on file  Physical Activity: Not on file  Stress: Not on file  Social Connections: Not on file   Intimate Partner Violence: Not on file      Review of Systems    All other ros negative  Objective:    BP (!) 155/83   Pulse 75   Temp 98 F (36.7 C) (Oral)   Wt 184 lb (83.5 kg)   SpO2 98%   BMI 29.70 kg/m  Nursing note and vital signs reviewed.  Physical Exam     General/constitutional: no distress, pleasant HEENT: Normocephalic, PER, Conj Clear, EOMI, Oropharynx clear Neck supple CV: rrr no mrg Lungs: clear to auscultation, normal respiratory effort Abd: Soft, Nontender Ext: no edema Skin: no rash Neuro: nonfocal MSK: no  peripheral joint swelling/tenderness/warmth; back spines nontender       Labs: Lab Results  Component Value Date   WBC 4.3 03/01/2024   HGB 12.6 (L) 03/01/2024   HCT 37.2 (L) 03/01/2024   MCV 96.4 03/01/2024   PLT 149 03/01/2024   Last metabolic panel Lab Results  Component Value Date   GLUCOSE 100 (H) 03/01/2024   NA 138 03/01/2024   K 4.4 03/01/2024   CL 103 03/01/2024   CO2 21 03/01/2024   BUN 35 (H) 03/01/2024   CREATININE 1.51 (H) 03/01/2024   EGFR 48 (L) 11/24/2023   GFRNONAA 43 (L) 09/12/2022   CALCIUM  9.6 03/01/2024   PROT 6.9 03/01/2024   ALBUMIN 3.1 (L) 09/12/2022   BILITOT 0.8 03/01/2024   ALKPHOS 64 09/12/2022   AST 62 (H) 03/01/2024   ALT 40 03/01/2024   ANIONGAP 5 09/12/2022    [facility]: 10/25 cbc 4/8.2/177; cr 0.98 10/18 cbc 6/8.6/251; cr 1.02 9/27 cbc 6/8/225 normal diff; cr 1.04 9/19 cbc 9/9/332; cr 1.02; lft 88/17/151/0.7  Crp: 10/25    16 (<10) 10/18    13 (<10) 9/29    106 (<10) 9/19      52 (<10)  Micro:  Serology:  Imaging:  Assessment & Plan:   Problem List Items Addressed This Visit     Alcohol  abuse   Wound infection complicating hardware   Other Visit Diagnoses       Chronic osteomyelitis of lumbar spine (HCC)    -  Primary        Abx: 10/09/2021-11/08/2024 cefadroxil    9/15-10/26 cefazolin  9/17-10/04 rifampin  300 mg po bid   08/26/21-15 vanc 08/26/21-15  cefepime    ASSESSMENT: Surgical site infection/vertebral om lumbar -- mssa s/p I&D 08/26/2021 Hardware complicating wound infection Rash suspect abx allergy to cefepime , less likely vanc   78 yo male hx l4-5 fusion 2019 with recurrent back pain/neurogenic claudication, s/p on 8/31 L3-4 laminotomy/facetectomy (complex surgical procedure due to prior lumbar intervody fusion), right L3-4 interbody fusion and zimmer DBM and insertion interbody prosthesis, posterior L3-5 instrumentation, however complicated most recently by early surgical site infection admitted 9/12 for I&D with retention hardware. Patient here for id clinic f/u   Reviewed 9/12 operative note. Hardware retained. Lot of pus in sq space; compromised lumbosacral fascia with pus tracking down to spine/hardware. Wound cx mssa   No sepsis this admission otherwise. No bcx obtained.   Given recent hardware placement <30 days, discharged on combination abx with rifampin   Developed rash on vanc/cefepime . Did ok on cefazolin  though  ------- 10/4 id clinic f/u Fatigue/generalized weakness/poor apptite likely abx combination and ongoing pain/pain meds (taking extended oxycodone  currently). Hct down and lft up considered rifampin  side effect Also has a forehead lesion that is suggestive of shingle. ?cause for crp elevation Pain in lower back severe but stable without neurologic deficit. Incision intact. Patient to discuss pain management with nsg today  At this time, will stop rifampin  and continue following labs Will see back in around 3 weeks for follow up   10/26 id clinic assessment Crp down Some intermittent serosanguinous stain at the incision but no dehiscence; slight erythema around it but ?related to tape/dressing  Fatigue/poor appetite likely multifactorial, low hemoglobin, infection, and ?as below suggested   New legs edema/ongoing fatigue query chf. No previous echo available; no hx chf. Needs chf evaluation. Cefazolin  not  typical to cause edema  11/18 id assessment Doing well today on oral cefadroxil . Sx from previous 2 clinic visits (fatigue/leg edema)  no longer present. Not really sure what it was although temporally related to iv abx use and rifampin   Plan indefinite cefadroxil , maybe decreased dose in another 2-3 months depending on clinical status/improvement  02/11/2022 id assessment Gi sx / tarry stool ?PUD with slow upper gib? Diarrhea slowed down since off abx -- might not be related given duration of time he has been on abx. Doesn't seem to be cdiff Discuss importance of staying on abx due to metals/back infection Also considered dehydration related acute renal injury causing more nausea    01/13/23 id assessment Continues to do well. Has another lumbar spine revision surgery for worsening LE pain. No pus or sign of hardware loosening during operative finding  Will continue on cefadroxil  indefinitely   04/14/23 id assessment Doing better off oxycodone  and starting physical therapy and on buprenorphine patch with pain clinic since 12/2022. Also better appetite. ?opioids associated hyperalgesia?  I am happy he is better controlled for his symptoms No sign of abx failure or relapse clinically Will see once year now -- end of this year 2024  No labs today  Continue pain clinic/pt-ot   11/23/24 id clinic f/u Patient had self taken off cefadroxil  about thanksgiving 2025 along with other medications He drinks 2 drinks a day as well and has bene a problem  Planned right hip arthroplasty 01/11/24. With this and unclear if he'll have relapse in the lower back with hardware associated mssa infection, advised him to go back on cefadroxil  and he agrees. 500 mg po bid  No labs today  Advised to call me if lower back pain worsen, or fever/chill   Follow up pcp to discuss medications issues otherwise and etoh consumption   F/u 4-6 weeks    Follow-up: Return in about 10 weeks (around  02/01/2025).     Constance ONEIDA Passer, MD [facility] [phone]  pager   [phone] cell 11/23/2024, 9:06 AM

## 2024-11-23 NOTE — Patient Instructions (Addendum)
 As you are having hip replacement and as I do not know if it's ok to go off antibiotic now, let's restart Cefadroxil  500 mg one capsule twice a day   See your primary care for liver pain and restarting field   See me in 2-3 months

## 2024-12-29 ENCOUNTER — Telehealth: Payer: Self-pay

## 2024-12-29 NOTE — Telephone Encounter (Signed)
 Health challenges for patient and wife.  Will cancel hip surgery on 01/10/25 for now.  Will call back when ready to reschedule.

## 2025-01-10 ENCOUNTER — Ambulatory Visit (HOSPITAL_COMMUNITY): Admit: 2025-01-10 | Admitting: Orthopaedic Surgery

## 2025-01-10 SURGERY — ARTHROPLASTY, HIP, TOTAL, ANTERIOR APPROACH
Anesthesia: Choice | Site: Hip | Laterality: Right

## 2025-01-23 ENCOUNTER — Encounter: Admitting: Orthopaedic Surgery

## 2025-02-07 ENCOUNTER — Ambulatory Visit: Admitting: Internal Medicine

## 7876-06-14 DEATH — deceased
